# Patient Record
Sex: Male | Born: 1935 | Race: White | Hispanic: No | Smoking: Former smoker
Health system: Southern US, Community
[De-identification: ages and names within clinical notes are randomized; demographics above are authoritative.]

## PROBLEM LIST (undated history)

## (undated) DIAGNOSIS — I2510 Atherosclerotic heart disease of native coronary artery without angina pectoris: Secondary | ICD-10-CM

## (undated) DIAGNOSIS — I447 Left bundle-branch block, unspecified: Secondary | ICD-10-CM

## (undated) DIAGNOSIS — I219 Acute myocardial infarction, unspecified: Secondary | ICD-10-CM

## (undated) DIAGNOSIS — I350 Nonrheumatic aortic (valve) stenosis: Secondary | ICD-10-CM

## (undated) DIAGNOSIS — I5022 Chronic systolic (congestive) heart failure: Secondary | ICD-10-CM

## (undated) DIAGNOSIS — I10 Essential (primary) hypertension: Secondary | ICD-10-CM

## (undated) DIAGNOSIS — M1990 Unspecified osteoarthritis, unspecified site: Secondary | ICD-10-CM

## (undated) DIAGNOSIS — K602 Anal fissure, unspecified: Secondary | ICD-10-CM

## (undated) DIAGNOSIS — Z950 Presence of cardiac pacemaker: Secondary | ICD-10-CM

## (undated) DIAGNOSIS — D649 Anemia, unspecified: Secondary | ICD-10-CM

## (undated) DIAGNOSIS — J42 Unspecified chronic bronchitis: Secondary | ICD-10-CM

## (undated) DIAGNOSIS — IMO0002 Reserved for concepts with insufficient information to code with codable children: Secondary | ICD-10-CM

## (undated) DIAGNOSIS — I340 Nonrheumatic mitral (valve) insufficiency: Secondary | ICD-10-CM

## (undated) DIAGNOSIS — E785 Hyperlipidemia, unspecified: Secondary | ICD-10-CM

## (undated) DIAGNOSIS — R55 Syncope and collapse: Secondary | ICD-10-CM

## (undated) DIAGNOSIS — R9430 Abnormal result of cardiovascular function study, unspecified: Secondary | ICD-10-CM

## (undated) DIAGNOSIS — C61 Malignant neoplasm of prostate: Secondary | ICD-10-CM

## (undated) DIAGNOSIS — E538 Deficiency of other specified B group vitamins: Secondary | ICD-10-CM

## (undated) DIAGNOSIS — D509 Iron deficiency anemia, unspecified: Secondary | ICD-10-CM

## (undated) DIAGNOSIS — Z95810 Presence of automatic (implantable) cardiac defibrillator: Secondary | ICD-10-CM

## (undated) DIAGNOSIS — E039 Hypothyroidism, unspecified: Secondary | ICD-10-CM

## (undated) DIAGNOSIS — K219 Gastro-esophageal reflux disease without esophagitis: Secondary | ICD-10-CM

## (undated) DIAGNOSIS — I442 Atrioventricular block, complete: Secondary | ICD-10-CM

## (undated) HISTORY — DX: Iron deficiency anemia, unspecified: D50.9

## (undated) HISTORY — DX: Nonrheumatic mitral (valve) insufficiency: I34.0

## (undated) HISTORY — DX: Left bundle-branch block, unspecified: I44.7

## (undated) HISTORY — DX: Gastro-esophageal reflux disease without esophagitis: K21.9

## (undated) HISTORY — PX: SHOULDER OPEN ROTATOR CUFF REPAIR: SHX2407

## (undated) HISTORY — DX: Unspecified osteoarthritis, unspecified site: M19.90

## (undated) HISTORY — DX: Deficiency of other specified B group vitamins: E53.8

## (undated) HISTORY — DX: Nonrheumatic aortic (valve) stenosis: I35.0

## (undated) HISTORY — DX: Chronic systolic (congestive) heart failure: I50.22

## (undated) HISTORY — DX: Syncope and collapse: R55

## (undated) HISTORY — PX: INSERTION PROSTATE RADIATION SEED: SUR718

## (undated) HISTORY — PX: PROSTATE BIOPSY: SHX241

## (undated) HISTORY — DX: Hyperlipidemia, unspecified: E78.5

## (undated) HISTORY — DX: Essential (primary) hypertension: I10

## (undated) HISTORY — PX: RECTAL DILATATION: SHX2305

## (undated) HISTORY — DX: Anal fissure, unspecified: K60.2

## (undated) HISTORY — DX: Atrioventricular block, complete: I44.2

## (undated) HISTORY — DX: Atherosclerotic heart disease of native coronary artery without angina pectoris: I25.10

## (undated) HISTORY — DX: Anemia, unspecified: D64.9

## (undated) HISTORY — DX: Abnormal result of cardiovascular function study, unspecified: R94.30

## (undated) HISTORY — DX: Reserved for concepts with insufficient information to code with codable children: IMO0002

---

## 1987-08-07 DIAGNOSIS — I219 Acute myocardial infarction, unspecified: Secondary | ICD-10-CM

## 1987-08-07 HISTORY — DX: Acute myocardial infarction, unspecified: I21.9

## 1999-02-15 ENCOUNTER — Ambulatory Visit (HOSPITAL_COMMUNITY): Admission: RE | Admit: 1999-02-15 | Discharge: 1999-02-15 | Payer: Self-pay | Admitting: Pulmonary Disease

## 1999-02-15 ENCOUNTER — Encounter: Payer: Self-pay | Admitting: Pulmonary Disease

## 2003-08-07 HISTORY — PX: ICD IMPLANT: EP1208

## 2003-12-09 ENCOUNTER — Inpatient Hospital Stay (HOSPITAL_COMMUNITY): Admission: EM | Admit: 2003-12-09 | Discharge: 2003-12-17 | Payer: Self-pay | Admitting: *Deleted

## 2004-06-20 ENCOUNTER — Ambulatory Visit: Payer: Self-pay | Admitting: Pulmonary Disease

## 2004-06-26 ENCOUNTER — Ambulatory Visit: Payer: Self-pay | Admitting: Cardiology

## 2004-07-13 ENCOUNTER — Ambulatory Visit: Payer: Self-pay

## 2004-07-24 ENCOUNTER — Ambulatory Visit: Payer: Self-pay | Admitting: Pulmonary Disease

## 2004-08-23 ENCOUNTER — Ambulatory Visit: Payer: Self-pay | Admitting: Pulmonary Disease

## 2004-09-26 ENCOUNTER — Ambulatory Visit: Payer: Self-pay | Admitting: Pulmonary Disease

## 2004-10-31 ENCOUNTER — Ambulatory Visit: Payer: Self-pay | Admitting: Pulmonary Disease

## 2004-11-01 ENCOUNTER — Ambulatory Visit: Payer: Self-pay | Admitting: Pulmonary Disease

## 2004-12-12 ENCOUNTER — Ambulatory Visit: Payer: Self-pay | Admitting: Internal Medicine

## 2004-12-13 ENCOUNTER — Ambulatory Visit: Payer: Self-pay | Admitting: Pulmonary Disease

## 2005-01-03 ENCOUNTER — Ambulatory Visit: Payer: Self-pay | Admitting: Cardiology

## 2005-01-22 ENCOUNTER — Ambulatory Visit: Payer: Self-pay | Admitting: Pulmonary Disease

## 2005-02-20 ENCOUNTER — Ambulatory Visit: Payer: Self-pay | Admitting: Pulmonary Disease

## 2005-03-20 ENCOUNTER — Ambulatory Visit: Payer: Self-pay | Admitting: Pulmonary Disease

## 2005-04-04 ENCOUNTER — Ambulatory Visit: Payer: Self-pay | Admitting: Pulmonary Disease

## 2005-04-04 ENCOUNTER — Inpatient Hospital Stay (HOSPITAL_COMMUNITY): Admission: AD | Admit: 2005-04-04 | Discharge: 2005-04-07 | Payer: Self-pay | Admitting: Pulmonary Disease

## 2005-04-05 ENCOUNTER — Ambulatory Visit: Payer: Self-pay | Admitting: Gastroenterology

## 2005-04-05 ENCOUNTER — Ambulatory Visit: Payer: Self-pay | Admitting: Cardiology

## 2005-04-06 ENCOUNTER — Encounter (INDEPENDENT_AMBULATORY_CARE_PROVIDER_SITE_OTHER): Payer: Self-pay | Admitting: *Deleted

## 2005-04-06 HISTORY — PX: LAPAROSCOPIC CHOLECYSTECTOMY: SUR755

## 2005-04-13 ENCOUNTER — Ambulatory Visit: Payer: Self-pay | Admitting: Pulmonary Disease

## 2005-04-24 ENCOUNTER — Ambulatory Visit: Payer: Self-pay | Admitting: Pulmonary Disease

## 2005-04-27 ENCOUNTER — Ambulatory Visit: Payer: Self-pay

## 2005-05-24 ENCOUNTER — Ambulatory Visit: Payer: Self-pay | Admitting: Gastroenterology

## 2005-05-28 ENCOUNTER — Ambulatory Visit: Payer: Self-pay | Admitting: Pulmonary Disease

## 2005-06-05 ENCOUNTER — Ambulatory Visit: Payer: Self-pay | Admitting: Cardiology

## 2005-07-23 ENCOUNTER — Ambulatory Visit: Payer: Self-pay | Admitting: Pulmonary Disease

## 2005-07-24 ENCOUNTER — Ambulatory Visit: Payer: Self-pay | Admitting: Pulmonary Disease

## 2005-08-21 ENCOUNTER — Ambulatory Visit: Payer: Self-pay | Admitting: Pulmonary Disease

## 2005-10-01 ENCOUNTER — Ambulatory Visit: Payer: Self-pay | Admitting: Pulmonary Disease

## 2005-10-29 ENCOUNTER — Ambulatory Visit: Payer: Self-pay | Admitting: Pulmonary Disease

## 2005-11-28 ENCOUNTER — Ambulatory Visit: Payer: Self-pay | Admitting: Pulmonary Disease

## 2005-12-26 ENCOUNTER — Ambulatory Visit: Payer: Self-pay | Admitting: Pulmonary Disease

## 2005-12-26 ENCOUNTER — Ambulatory Visit: Payer: Self-pay | Admitting: Internal Medicine

## 2006-01-01 ENCOUNTER — Ambulatory Visit: Payer: Self-pay | Admitting: Pulmonary Disease

## 2006-01-30 ENCOUNTER — Ambulatory Visit: Payer: Self-pay | Admitting: Pulmonary Disease

## 2006-03-04 ENCOUNTER — Ambulatory Visit: Payer: Self-pay | Admitting: Pulmonary Disease

## 2006-03-28 ENCOUNTER — Ambulatory Visit: Payer: Self-pay | Admitting: Internal Medicine

## 2006-04-03 ENCOUNTER — Ambulatory Visit: Payer: Self-pay | Admitting: Pulmonary Disease

## 2006-04-25 ENCOUNTER — Ambulatory Visit: Payer: Self-pay | Admitting: Pulmonary Disease

## 2006-05-01 ENCOUNTER — Ambulatory Visit: Payer: Self-pay | Admitting: Pulmonary Disease

## 2006-05-06 ENCOUNTER — Ambulatory Visit: Payer: Self-pay | Admitting: Cardiology

## 2006-05-23 ENCOUNTER — Ambulatory Visit: Payer: Self-pay

## 2006-05-29 ENCOUNTER — Ambulatory Visit: Payer: Self-pay | Admitting: Pulmonary Disease

## 2006-05-31 ENCOUNTER — Ambulatory Visit: Payer: Self-pay | Admitting: Cardiology

## 2006-07-01 ENCOUNTER — Ambulatory Visit: Payer: Self-pay | Admitting: Pulmonary Disease

## 2006-07-02 ENCOUNTER — Ambulatory Visit: Payer: Self-pay | Admitting: Internal Medicine

## 2006-07-31 ENCOUNTER — Ambulatory Visit: Payer: Self-pay | Admitting: Pulmonary Disease

## 2006-08-06 HISTORY — PX: CARDIAC CATHETERIZATION: SHX172

## 2006-08-21 ENCOUNTER — Ambulatory Visit: Payer: Self-pay | Admitting: Pulmonary Disease

## 2006-08-21 LAB — CONVERTED CEMR LAB
Albumin: 3.7 g/dL (ref 3.5–5.2)
BUN: 16 mg/dL (ref 6–23)
Basophils Absolute: 0 10*3/uL (ref 0.0–0.1)
CO2: 29 meq/L (ref 19–32)
Chloride: 108 meq/L (ref 96–112)
Creatinine, Ser: 1 mg/dL (ref 0.4–1.5)
Hemoglobin: 12.7 g/dL — ABNORMAL LOW (ref 13.0–17.0)
MCHC: 33.8 g/dL (ref 30.0–36.0)
MCV: 92.5 fL (ref 78.0–100.0)
Monocytes Absolute: 0.5 10*3/uL (ref 0.2–0.7)
Monocytes Relative: 11.7 % — ABNORMAL HIGH (ref 3.0–11.0)
Neutro Abs: 2.4 10*3/uL (ref 1.4–7.7)
Neutrophils Relative %: 56.5 % (ref 43.0–77.0)
PSA: 0.32 ng/mL
RDW: 12.4 % (ref 11.5–14.6)
Sodium: 142 meq/L (ref 135–145)
Total Bilirubin: 1.7 mg/dL — ABNORMAL HIGH (ref 0.3–1.2)
Total CHOL/HDL Ratio: 3
Triglycerides: 60 mg/dL (ref 0–149)
VLDL: 12 mg/dL (ref 0–40)
Vitamin B-12: 236 pg/mL (ref 211–911)

## 2006-08-28 ENCOUNTER — Ambulatory Visit: Payer: Self-pay | Admitting: Pulmonary Disease

## 2006-09-30 ENCOUNTER — Ambulatory Visit: Payer: Self-pay | Admitting: Pulmonary Disease

## 2006-10-03 ENCOUNTER — Ambulatory Visit: Payer: Self-pay | Admitting: Internal Medicine

## 2006-10-29 ENCOUNTER — Ambulatory Visit: Payer: Self-pay | Admitting: Pulmonary Disease

## 2006-11-27 ENCOUNTER — Ambulatory Visit: Payer: Self-pay | Admitting: Pulmonary Disease

## 2006-12-13 ENCOUNTER — Ambulatory Visit: Payer: Self-pay | Admitting: Internal Medicine

## 2006-12-14 ENCOUNTER — Observation Stay (HOSPITAL_COMMUNITY): Admission: EM | Admit: 2006-12-14 | Discharge: 2006-12-17 | Payer: Self-pay | Admitting: Emergency Medicine

## 2006-12-14 ENCOUNTER — Ambulatory Visit: Payer: Self-pay | Admitting: Cardiology

## 2006-12-25 ENCOUNTER — Ambulatory Visit: Payer: Self-pay | Admitting: Pulmonary Disease

## 2007-01-08 ENCOUNTER — Ambulatory Visit: Payer: Self-pay | Admitting: Cardiology

## 2007-01-27 ENCOUNTER — Ambulatory Visit: Payer: Self-pay | Admitting: Pulmonary Disease

## 2007-02-19 ENCOUNTER — Ambulatory Visit: Payer: Self-pay | Admitting: Pulmonary Disease

## 2007-02-19 LAB — CONVERTED CEMR LAB
Albumin: 3.8 g/dL (ref 3.5–5.2)
Bilirubin, Direct: 0.1 mg/dL (ref 0.0–0.3)
LDL Cholesterol: 90 mg/dL (ref 0–99)
Total CHOL/HDL Ratio: 4.4
Triglycerides: 124 mg/dL (ref 0–149)
VLDL: 25 mg/dL (ref 0–40)

## 2007-02-26 ENCOUNTER — Ambulatory Visit: Payer: Self-pay | Admitting: Pulmonary Disease

## 2007-03-31 ENCOUNTER — Ambulatory Visit: Payer: Self-pay | Admitting: Pulmonary Disease

## 2007-04-03 ENCOUNTER — Ambulatory Visit: Payer: Self-pay | Admitting: Internal Medicine

## 2007-04-09 ENCOUNTER — Ambulatory Visit: Payer: Self-pay | Admitting: Cardiology

## 2007-05-01 ENCOUNTER — Ambulatory Visit: Payer: Self-pay | Admitting: Pulmonary Disease

## 2007-05-27 ENCOUNTER — Encounter: Payer: Self-pay | Admitting: Pulmonary Disease

## 2007-06-02 ENCOUNTER — Ambulatory Visit: Payer: Self-pay | Admitting: Pulmonary Disease

## 2007-07-01 ENCOUNTER — Ambulatory Visit: Payer: Self-pay | Admitting: Pulmonary Disease

## 2007-07-08 ENCOUNTER — Ambulatory Visit: Payer: Self-pay | Admitting: Internal Medicine

## 2007-08-01 ENCOUNTER — Ambulatory Visit: Payer: Self-pay | Admitting: Pulmonary Disease

## 2007-08-12 ENCOUNTER — Telehealth: Payer: Self-pay | Admitting: Pulmonary Disease

## 2007-08-14 ENCOUNTER — Ambulatory Visit: Payer: Self-pay | Admitting: Pulmonary Disease

## 2007-08-19 ENCOUNTER — Ambulatory Visit: Payer: Self-pay | Admitting: Pulmonary Disease

## 2007-08-19 DIAGNOSIS — M1990 Unspecified osteoarthritis, unspecified site: Secondary | ICD-10-CM

## 2007-08-19 LAB — CONVERTED CEMR LAB
ALT: 20 units/L (ref 0–53)
AST: 25 units/L (ref 0–37)
Albumin: 3.9 g/dL (ref 3.5–5.2)
Basophils Absolute: 0 10*3/uL (ref 0.0–0.1)
Bilirubin, Direct: 0.2 mg/dL (ref 0.0–0.3)
Calcium: 9.2 mg/dL (ref 8.4–10.5)
Chloride: 102 meq/L (ref 96–112)
Cholesterol: 163 mg/dL (ref 0–200)
Eosinophils Absolute: 0.5 10*3/uL (ref 0.0–0.6)
GFR calc Af Amer: 94 mL/min
GFR calc non Af Amer: 78 mL/min
Glucose, Bld: 94 mg/dL (ref 70–99)
HDL: 33.7 mg/dL — ABNORMAL LOW (ref >39.0)
Lymphocytes Relative: 27 % (ref 12.0–46.0)
MCHC: 35.5 g/dL (ref 30.0–36.0)
MCV: 92.1 fL (ref 78.0–100.0)
Neutro Abs: 2.6 10*3/uL (ref 1.4–7.7)
Platelets: 213 10*3/uL (ref 150–400)
RBC: 3.94 M/uL — ABNORMAL LOW (ref 4.22–5.81)
Sodium: 137 meq/L (ref 135–145)
TSH: 5 microintl units/mL (ref 0.35–5.50)
Total CHOL/HDL Ratio: 4.8
Triglycerides: 94 mg/dL (ref 0–149)

## 2007-09-01 ENCOUNTER — Ambulatory Visit: Payer: Self-pay | Admitting: Pulmonary Disease

## 2007-10-02 ENCOUNTER — Ambulatory Visit: Payer: Self-pay | Admitting: Pulmonary Disease

## 2007-10-06 ENCOUNTER — Ambulatory Visit: Payer: Self-pay | Admitting: Internal Medicine

## 2007-10-30 ENCOUNTER — Ambulatory Visit: Payer: Self-pay | Admitting: Pulmonary Disease

## 2007-11-17 ENCOUNTER — Ambulatory Visit: Payer: Self-pay | Admitting: Cardiology

## 2007-11-21 ENCOUNTER — Ambulatory Visit: Payer: Self-pay

## 2007-12-01 ENCOUNTER — Ambulatory Visit: Payer: Self-pay | Admitting: Pulmonary Disease

## 2007-12-03 ENCOUNTER — Ambulatory Visit: Payer: Self-pay | Admitting: Cardiology

## 2007-12-05 ENCOUNTER — Ambulatory Visit: Payer: Self-pay | Admitting: Internal Medicine

## 2007-12-30 ENCOUNTER — Ambulatory Visit: Payer: Self-pay | Admitting: Pulmonary Disease

## 2008-01-30 ENCOUNTER — Ambulatory Visit: Payer: Self-pay | Admitting: Pulmonary Disease

## 2008-02-20 ENCOUNTER — Telehealth: Payer: Self-pay | Admitting: Pulmonary Disease

## 2008-02-24 ENCOUNTER — Ambulatory Visit: Payer: Self-pay | Admitting: Pulmonary Disease

## 2008-03-01 ENCOUNTER — Ambulatory Visit: Payer: Self-pay | Admitting: Pulmonary Disease

## 2008-03-01 DIAGNOSIS — E538 Deficiency of other specified B group vitamins: Secondary | ICD-10-CM

## 2008-03-01 LAB — CONVERTED CEMR LAB
AST: 25 units/L (ref 0–37)
Albumin: 4.1 g/dL (ref 3.5–5.2)
Alkaline Phosphatase: 42 units/L (ref 39–117)
BUN: 19 mg/dL (ref 6–23)
Basophils Absolute: 0 10*3/uL (ref 0.0–0.1)
Bilirubin, Direct: 0.3 mg/dL (ref 0.0–0.3)
Chloride: 104 meq/L (ref 96–112)
Eosinophils Absolute: 0.4 10*3/uL (ref 0.0–0.7)
GFR calc Af Amer: 106 mL/min
GFR calc non Af Amer: 88 mL/min
HCT: 37.5 % — ABNORMAL LOW (ref 39.0–52.0)
HDL: 38.1 mg/dL — ABNORMAL LOW (ref >39.0)
MCV: 94.1 fL (ref 78.0–100.0)
Monocytes Absolute: 0.5 10*3/uL (ref 0.1–1.0)
Neutrophils Relative %: 55.2 % (ref 43.0–77.0)
Platelets: 214 10*3/uL (ref 150–400)
Potassium: 4.7 meq/L (ref 3.5–5.1)
RDW: 12 % (ref 11.5–14.6)
Sodium: 140 meq/L (ref 135–145)
Total Bilirubin: 2.2 mg/dL — ABNORMAL HIGH (ref 0.3–1.2)
Triglycerides: 92 mg/dL (ref 0–149)
VLDL: 18 mg/dL (ref 0–40)
WBC: 4.8 10*3/uL (ref 4.5–10.5)

## 2008-03-08 ENCOUNTER — Ambulatory Visit: Payer: Self-pay | Admitting: Internal Medicine

## 2008-04-01 ENCOUNTER — Ambulatory Visit: Payer: Self-pay | Admitting: Pulmonary Disease

## 2008-05-03 ENCOUNTER — Ambulatory Visit: Payer: Self-pay | Admitting: Pulmonary Disease

## 2008-05-04 ENCOUNTER — Ambulatory Visit: Payer: Self-pay | Admitting: Pulmonary Disease

## 2008-05-17 ENCOUNTER — Ambulatory Visit: Payer: Self-pay | Admitting: Cardiology

## 2008-06-01 ENCOUNTER — Ambulatory Visit: Payer: Self-pay | Admitting: Pulmonary Disease

## 2008-06-07 ENCOUNTER — Ambulatory Visit: Payer: Self-pay | Admitting: Internal Medicine

## 2008-06-29 ENCOUNTER — Ambulatory Visit: Payer: Self-pay | Admitting: Pulmonary Disease

## 2008-08-02 ENCOUNTER — Ambulatory Visit: Payer: Self-pay | Admitting: Pulmonary Disease

## 2008-08-03 DIAGNOSIS — D649 Anemia, unspecified: Secondary | ICD-10-CM | POA: Insufficient documentation

## 2008-08-25 ENCOUNTER — Telehealth: Payer: Self-pay | Admitting: Pulmonary Disease

## 2008-08-27 ENCOUNTER — Ambulatory Visit: Payer: Self-pay | Admitting: Pulmonary Disease

## 2008-09-01 ENCOUNTER — Ambulatory Visit: Payer: Self-pay | Admitting: Pulmonary Disease

## 2008-09-01 LAB — CONVERTED CEMR LAB
Basophils Absolute: 0 10*3/uL (ref 0.0–0.1)
Basophils Relative: 0.5 % (ref 0.0–3.0)
Bilirubin Urine: NEGATIVE
Calcium: 9.3 mg/dL (ref 8.4–10.5)
Cholesterol: 125 mg/dL (ref 0–200)
Creatinine, Ser: 0.9 mg/dL (ref 0.4–1.5)
Eosinophils Absolute: 0.4 10*3/uL (ref 0.0–0.7)
GFR calc Af Amer: 106 mL/min
GFR calc non Af Amer: 88 mL/min
HDL: 32 mg/dL — ABNORMAL LOW (ref >39.0)
Hemoglobin: 12.7 g/dL — ABNORMAL LOW (ref 13.0–17.0)
Ketones, ur: NEGATIVE mg/dL
LDL Cholesterol: 80 mg/dL (ref 0–99)
MCHC: 34.7 g/dL (ref 30.0–36.0)
MCV: 93.5 fL (ref 78.0–100.0)
Neutro Abs: 3 10*3/uL (ref 1.4–7.7)
Neutrophils Relative %: 60 % (ref 43.0–77.0)
PSA: 0.62 ng/mL (ref 0.10–4.00)
RBC: 3.93 M/uL — ABNORMAL LOW (ref 4.22–5.81)
RDW: 12.3 % (ref 11.5–14.6)
Specific Gravity, Urine: 1.01 (ref 1.000–1.03)
TSH: 4.97 microintl units/mL (ref 0.35–5.50)
Total Bilirubin: 2.1 mg/dL — ABNORMAL HIGH (ref 0.3–1.2)
Triglycerides: 67 mg/dL (ref 0–149)
Urine Glucose: NEGATIVE mg/dL
Urobilinogen, UA: 1 (ref 0.0–1.0)

## 2008-09-07 ENCOUNTER — Ambulatory Visit: Payer: Self-pay | Admitting: Internal Medicine

## 2008-10-01 ENCOUNTER — Ambulatory Visit: Payer: Self-pay | Admitting: Pulmonary Disease

## 2008-10-29 ENCOUNTER — Ambulatory Visit: Payer: Self-pay | Admitting: Pulmonary Disease

## 2008-11-29 ENCOUNTER — Ambulatory Visit: Payer: Self-pay | Admitting: Pulmonary Disease

## 2008-12-16 ENCOUNTER — Ambulatory Visit: Payer: Self-pay | Admitting: Internal Medicine

## 2008-12-29 ENCOUNTER — Ambulatory Visit: Payer: Self-pay | Admitting: Pulmonary Disease

## 2009-01-28 ENCOUNTER — Ambulatory Visit: Payer: Self-pay | Admitting: Pulmonary Disease

## 2009-02-17 ENCOUNTER — Telehealth: Payer: Self-pay | Admitting: Pulmonary Disease

## 2009-02-21 ENCOUNTER — Ambulatory Visit: Payer: Self-pay | Admitting: Pulmonary Disease

## 2009-03-01 ENCOUNTER — Ambulatory Visit: Payer: Self-pay | Admitting: Pulmonary Disease

## 2009-03-01 LAB — CONVERTED CEMR LAB
Albumin: 3.7 g/dL (ref 3.5–5.2)
Alkaline Phosphatase: 55 units/L (ref 39–117)
Basophils Absolute: 0 10*3/uL (ref 0.0–0.1)
CO2: 30 meq/L (ref 19–32)
Calcium: 9 mg/dL (ref 8.4–10.5)
Cholesterol: 124 mg/dL (ref 0–200)
Glucose, Bld: 82 mg/dL (ref 70–99)
HDL: 35.8 mg/dL — ABNORMAL LOW (ref >39.00)
Hemoglobin, Urine: NEGATIVE
Hemoglobin: 12.7 g/dL — ABNORMAL LOW (ref 13.0–17.0)
Leukocytes, UA: NEGATIVE
Lymphocytes Relative: 19.1 % (ref 12.0–46.0)
Monocytes Relative: 10.2 % (ref 3.0–12.0)
Neutro Abs: 3.8 10*3/uL (ref 1.4–7.7)
Nitrite: NEGATIVE
PSA: 0.63 ng/mL (ref 0.10–4.00)
Platelets: 172 10*3/uL (ref 150.0–400.0)
RDW: 12.1 % (ref 11.5–14.6)
Sodium: 142 meq/L (ref 135–145)
TSH: 4.73 microintl units/mL (ref 0.35–5.50)
Total Protein: 6.9 g/dL (ref 6.0–8.3)
Triglycerides: 86 mg/dL (ref 0.0–149.0)
Urobilinogen, UA: 0.2 (ref 0.0–1.0)

## 2009-03-14 ENCOUNTER — Encounter: Payer: Self-pay | Admitting: Internal Medicine

## 2009-03-14 ENCOUNTER — Ambulatory Visit: Payer: Self-pay | Admitting: Internal Medicine

## 2009-03-22 ENCOUNTER — Encounter: Payer: Self-pay | Admitting: Internal Medicine

## 2009-04-01 ENCOUNTER — Ambulatory Visit: Payer: Self-pay | Admitting: Pulmonary Disease

## 2009-05-04 ENCOUNTER — Ambulatory Visit: Payer: Self-pay | Admitting: Pulmonary Disease

## 2009-05-17 ENCOUNTER — Encounter: Payer: Self-pay | Admitting: Cardiology

## 2009-05-18 ENCOUNTER — Ambulatory Visit: Payer: Self-pay | Admitting: Cardiology

## 2009-06-01 ENCOUNTER — Ambulatory Visit: Payer: Self-pay | Admitting: Internal Medicine

## 2009-06-01 ENCOUNTER — Ambulatory Visit: Payer: Self-pay | Admitting: Pulmonary Disease

## 2009-06-13 ENCOUNTER — Ambulatory Visit: Payer: Self-pay | Admitting: Internal Medicine

## 2009-06-23 ENCOUNTER — Encounter: Payer: Self-pay | Admitting: Internal Medicine

## 2009-07-01 ENCOUNTER — Ambulatory Visit: Payer: Self-pay | Admitting: Pulmonary Disease

## 2009-08-03 ENCOUNTER — Ambulatory Visit: Payer: Self-pay | Admitting: Pulmonary Disease

## 2009-08-22 ENCOUNTER — Ambulatory Visit: Payer: Self-pay | Admitting: Internal Medicine

## 2009-08-22 ENCOUNTER — Telehealth: Payer: Self-pay | Admitting: Pulmonary Disease

## 2009-08-23 ENCOUNTER — Encounter: Payer: Self-pay | Admitting: Internal Medicine

## 2009-08-24 ENCOUNTER — Ambulatory Visit: Payer: Self-pay | Admitting: Pulmonary Disease

## 2009-08-30 ENCOUNTER — Telehealth: Payer: Self-pay | Admitting: Pulmonary Disease

## 2009-08-30 ENCOUNTER — Ambulatory Visit: Payer: Self-pay | Admitting: Pulmonary Disease

## 2009-08-31 LAB — CONVERTED CEMR LAB
ALT: 20 units/L (ref 0–53)
Albumin: 4.2 g/dL (ref 3.5–5.2)
Basophils Absolute: 0 10*3/uL (ref 0.0–0.1)
Calcium: 9.3 mg/dL (ref 8.4–10.5)
Eosinophils Relative: 5 % (ref 0.0–5.0)
GFR calc non Af Amer: 87.39 mL/min (ref >60)
HCT: 38.1 % — ABNORMAL LOW (ref 39.0–52.0)
HDL: 38.8 mg/dL — ABNORMAL LOW (ref >39.00)
Hemoglobin: 12.6 g/dL — ABNORMAL LOW (ref 13.0–17.0)
Lymphs Abs: 1.2 10*3/uL (ref 0.7–4.0)
MCV: 96.5 fL (ref 78.0–100.0)
Monocytes Absolute: 0.6 10*3/uL (ref 0.1–1.0)
Neutro Abs: 3.4 10*3/uL (ref 1.4–7.7)
PSA: 0.6 ng/mL (ref 0.10–4.00)
Platelets: 178 10*3/uL (ref 150.0–400.0)
RDW: 11.8 % (ref 11.5–14.6)
Sodium: 139 meq/L (ref 135–145)
TSH: 4.88 microintl units/mL (ref 0.35–5.50)
Total Bilirubin: 1.7 mg/dL — ABNORMAL HIGH (ref 0.3–1.2)
Triglycerides: 99 mg/dL (ref 0.0–149.0)

## 2009-09-30 ENCOUNTER — Ambulatory Visit: Payer: Self-pay | Admitting: Pulmonary Disease

## 2009-10-24 ENCOUNTER — Ambulatory Visit: Payer: Self-pay | Admitting: Internal Medicine

## 2009-10-28 ENCOUNTER — Ambulatory Visit: Payer: Self-pay | Admitting: Pulmonary Disease

## 2009-11-09 ENCOUNTER — Encounter: Payer: Self-pay | Admitting: Internal Medicine

## 2009-11-28 ENCOUNTER — Ambulatory Visit: Payer: Self-pay | Admitting: Pulmonary Disease

## 2009-12-27 ENCOUNTER — Ambulatory Visit: Payer: Self-pay | Admitting: Internal Medicine

## 2009-12-28 ENCOUNTER — Ambulatory Visit: Payer: Self-pay | Admitting: Pulmonary Disease

## 2009-12-28 ENCOUNTER — Encounter: Payer: Self-pay | Admitting: Internal Medicine

## 2010-01-06 ENCOUNTER — Ambulatory Visit: Payer: Self-pay | Admitting: Internal Medicine

## 2010-01-06 LAB — CONVERTED CEMR LAB
Calcium: 9.3 mg/dL (ref 8.4–10.5)
Chloride: 106 meq/L (ref 96–112)
Creatinine, Ser: 0.96 mg/dL (ref 0.40–1.50)
Eosinophils Relative: 13 % — ABNORMAL HIGH (ref 0–5)
HCT: 33.3 % — ABNORMAL LOW (ref 39.0–52.0)
Hemoglobin: 11.8 g/dL — ABNORMAL LOW (ref 13.0–17.0)
INR: 0.95 (ref <1.50)
Lymphocytes Relative: 27 % (ref 12–46)
Lymphs Abs: 1.4 10*3/uL (ref 0.7–4.0)
Platelets: 187 10*3/uL (ref 150–400)
WBC: 5.2 10*3/uL (ref 4.0–10.5)
aPTT: 28 s (ref 24–37)

## 2010-01-12 ENCOUNTER — Ambulatory Visit (HOSPITAL_COMMUNITY): Admission: RE | Admit: 2010-01-12 | Discharge: 2010-01-12 | Payer: Self-pay | Admitting: Internal Medicine

## 2010-01-12 ENCOUNTER — Ambulatory Visit: Payer: Self-pay | Admitting: Internal Medicine

## 2010-01-16 ENCOUNTER — Encounter: Payer: Self-pay | Admitting: Internal Medicine

## 2010-01-23 ENCOUNTER — Ambulatory Visit: Payer: Self-pay

## 2010-01-27 ENCOUNTER — Ambulatory Visit: Payer: Self-pay | Admitting: Pulmonary Disease

## 2010-03-08 ENCOUNTER — Ambulatory Visit: Payer: Self-pay | Admitting: Pulmonary Disease

## 2010-03-10 ENCOUNTER — Ambulatory Visit: Payer: Self-pay | Admitting: Pulmonary Disease

## 2010-03-14 LAB — CONVERTED CEMR LAB
Albumin: 4 g/dL (ref 3.5–5.2)
Chloride: 107 meq/L (ref 96–112)
Cholesterol: 134 mg/dL (ref 0–200)
HDL: 36.1 mg/dL — ABNORMAL LOW (ref >39.00)
LDL Cholesterol: 82 mg/dL (ref 0–99)
Potassium: 4.7 meq/L (ref 3.5–5.1)
Sodium: 142 meq/L (ref 135–145)
Total Protein: 6.7 g/dL (ref 6.0–8.3)
Triglycerides: 82 mg/dL (ref 0.0–149.0)
VLDL: 16.4 mg/dL (ref 0.0–40.0)

## 2010-04-07 ENCOUNTER — Ambulatory Visit: Payer: Self-pay | Admitting: Pulmonary Disease

## 2010-05-02 ENCOUNTER — Ambulatory Visit: Payer: Self-pay | Admitting: Internal Medicine

## 2010-05-08 ENCOUNTER — Ambulatory Visit: Payer: Self-pay | Admitting: Internal Medicine

## 2010-05-08 ENCOUNTER — Ambulatory Visit: Payer: Self-pay | Admitting: Pulmonary Disease

## 2010-05-09 ENCOUNTER — Encounter: Payer: Self-pay | Admitting: Cardiology

## 2010-05-10 ENCOUNTER — Ambulatory Visit: Payer: Self-pay | Admitting: Cardiology

## 2010-05-22 ENCOUNTER — Ambulatory Visit: Payer: Self-pay | Admitting: Cardiology

## 2010-05-22 ENCOUNTER — Ambulatory Visit: Payer: Self-pay

## 2010-05-22 ENCOUNTER — Ambulatory Visit (HOSPITAL_COMMUNITY): Admission: RE | Admit: 2010-05-22 | Discharge: 2010-05-22 | Payer: Self-pay | Admitting: Cardiology

## 2010-05-22 ENCOUNTER — Encounter: Payer: Self-pay | Admitting: Cardiology

## 2010-05-27 ENCOUNTER — Encounter: Payer: Self-pay | Admitting: Internal Medicine

## 2010-05-31 ENCOUNTER — Telehealth: Payer: Self-pay | Admitting: Cardiology

## 2010-06-08 ENCOUNTER — Ambulatory Visit: Payer: Self-pay | Admitting: Pulmonary Disease

## 2010-07-07 ENCOUNTER — Ambulatory Visit: Payer: Self-pay | Admitting: Pulmonary Disease

## 2010-08-03 ENCOUNTER — Ambulatory Visit: Payer: Self-pay | Admitting: Internal Medicine

## 2010-08-04 ENCOUNTER — Ambulatory Visit: Payer: Self-pay | Admitting: Pulmonary Disease

## 2010-08-24 ENCOUNTER — Telehealth: Payer: Self-pay | Admitting: Pulmonary Disease

## 2010-08-28 ENCOUNTER — Other Ambulatory Visit: Payer: Self-pay | Admitting: Pulmonary Disease

## 2010-08-28 ENCOUNTER — Ambulatory Visit
Admission: RE | Admit: 2010-08-28 | Discharge: 2010-08-28 | Payer: Self-pay | Source: Home / Self Care | Attending: Pulmonary Disease | Admitting: Pulmonary Disease

## 2010-08-28 LAB — CBC WITH DIFFERENTIAL/PLATELET
Basophils Relative: 1.3 % (ref 0.0–3.0)
Eosinophils Absolute: 0.3 10*3/uL (ref 0.0–0.7)
Eosinophils Relative: 7 % — ABNORMAL HIGH (ref 0.0–5.0)
HCT: 37.9 % — ABNORMAL LOW (ref 39.0–52.0)
Lymphs Abs: 1.2 10*3/uL (ref 0.7–4.0)
MCHC: 34.8 g/dL (ref 30.0–36.0)
Monocytes Absolute: 0.5 10*3/uL (ref 0.1–1.0)
Neutro Abs: 2.6 10*3/uL (ref 1.4–7.7)
Neutrophils Relative %: 56.2 % (ref 43.0–77.0)
Platelets: 224 10*3/uL (ref 150.0–400.0)
WBC: 4.6 10*3/uL (ref 4.5–10.5)

## 2010-08-28 LAB — BASIC METABOLIC PANEL
CO2: 30 mEq/L (ref 19–32)
GFR: 95.69 mL/min (ref >60.00)
Glucose, Bld: 90 mg/dL (ref 70–99)
Potassium: 4.9 mEq/L (ref 3.5–5.1)

## 2010-08-28 LAB — HEPATIC FUNCTION PANEL
ALT: 15 U/L (ref 0–53)
Bilirubin, Direct: 0.2 mg/dL (ref 0.0–0.3)

## 2010-08-28 LAB — LIPID PANEL: VLDL: 27.8 mg/dL (ref 0.0–40.0)

## 2010-09-05 ENCOUNTER — Ambulatory Visit
Admission: RE | Admit: 2010-09-05 | Discharge: 2010-09-05 | Payer: Self-pay | Source: Home / Self Care | Attending: Pulmonary Disease | Admitting: Pulmonary Disease

## 2010-09-07 NOTE — Cardiovascular Report (Signed)
Summary: Office Visit   Office Visit   Imported By: Roderic Ovens 05/03/2010 15:38:23  _____________________________________________________________________  External Attachment:    Type:   Image     Comment:   External Document

## 2010-09-07 NOTE — Assessment & Plan Note (Signed)
Summary: b-12/apc  Nurse Visit   Allergies: No Known Drug Allergies  Medication Administration  Injection # 1:    Medication: Vit B12 1000 mcg    Diagnosis: UNSPECIFIED ANEMIA (ICD-285.9)    Route: SQ    Site: L deltoid    Exp Date: 04/08/2011    Lot #: [id number]    Mfr: American Regent    Comments: B -12 SHOT LEFT ARM    Patient tolerated injection without complications    Given by: Glade Lloyd IN ALLERGY LAB  Orders Added: 1)  Vit B12 1000 mcg [J3420] 2)  Admin of Therapeutic Inj  intramuscular or subcutaneous [96372]   Medication Administration  Injection # 1:    Medication: Vit B12 1000 mcg    Diagnosis: UNSPECIFIED ANEMIA (ICD-285.9)    Route: SQ    Site: L deltoid    Exp Date: 04/08/2011    Lot #: [id number]    Mfr: American Regent    Comments: B -12 SHOT LEFT ARM    Patient tolerated injection without complications    Given by: Glade Lloyd IN ALLERGY LAB  Orders Added: 1)  Vit B12 1000 mcg [J3420] 2)  Admin of Therapeutic Inj  intramuscular or subcutaneous [56213]

## 2010-09-07 NOTE — Assessment & Plan Note (Signed)
Summary: b-12/aspc  Nurse Visit   Allergies: No Known Drug Allergies  Immunizations Administered:  Influenza Vaccine # 1:    Vaccine Type: Fluvax MCR    Site: right deltoid    Mfr: GlaxoSmithKline    Dose: 0.5 ml    Route: IM    Given by: Kandice Hams CMA    Exp. Date: 02/03/2011    Lot #: [id number]    VIS given: 02/28/10 version given May 08, 2010.  Flu Vaccine Consent Questions:    Do you have a history of severe allergic reactions to this vaccine? no    Any prior history of allergic reactions to egg and/or gelatin? no    Do you have a sensitivity to the preservative Thimersol? no    Do you have a past history of Guillan-Barre Syndrome? no    Do you currently have an acute febrile illness? no    Have you ever had a severe reaction to latex? no    Vaccine information given and explained to patient? yes  Medication Administration  Injection # 3:    Medication: Vit B12 1000 mcg    Diagnosis: UNSPECIFIED ANEMIA (ICD-285.9)    Route: IM    Site: L deltoid    Exp Date: 09/07/2011    Lot #: [id number]    Mfr: American Regent    Patient tolerated injection without complications    Given by: Kandice Hams CMA (May 08, 2010 10:26 AM)  Orders Added: 1)  Vit B12 1000 mcg [J3420] 2)  Admin of Therapeutic Inj  intramuscular or subcutaneous [96372] 3)  Influenza Vaccine MCR [00025]

## 2010-09-07 NOTE — Progress Notes (Signed)
Summary: order  Phone Note Call from Patient   Caller: Daughter Call For: [name] Summary of Call: pt need labs put in for cpx Initial call taken by: Rickard Patience,  August 22, 2009 8:24 AM  Follow-up for Phone Call        Pt has cpx sched for Tues 1/25 at 2 pm.  Would like to have labs done this wk.  Please advise what needs to be ordered and I will be happy to place in idx thanks! [name]  August 22, 2009 8:35 AM   Additional Follow-up for Phone Call Additional follow up Details #1::        per SN---ok for labs---labs are in computer for pt and lmom to make pt aware. [name] CMA  August 22, 2009 10:30 AM

## 2010-09-07 NOTE — Assessment & Plan Note (Signed)
Summary: 6 month return/mhh   Primary Care Provider:  Dr.Scott Nadel  CC:  6 month ROV & review of mult medical problems....  History of Present Illness: 77 y/o Blevins here for a follow up visit & review of mult medical problems...   ~  Jul10:  he states he's been doing well and has no complaints or concerns today... 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 he saw Delton See for a Cardiology f/u in Oct09- doing well, no change in meds.   ~  August 30, 2009:  he continues to do well- no new complaints or concerns... BP controlled on meds; he saw DrKatz for f/u 10/10- doing well, no CP, mild MR, ICD in place, no change in Rx... Chol controlled on Simva + Gemfib + Fish Oil; still getting B12 shots monthly; has Flu shot 10/10 & due for PNEUMOVAX now.    Current Problem List:  HYPERTENSION (ICD-401.9) - controlled on TOPROL XL 100mg /d & ALTACE 10mg /d... takes meds regularly and tolerates well... BP 134/72 here, and OK at home per pt... he gets rare HA which he believes is related to his BP... denies fatigue, visual changes, CP, palipit, dizziness, syncope, dyspnea, edema, etc...  CAD (ICD-414.00) - he's had prev MI w/ mult caths/ interventions... on ASA 81mg /d & PLAVIX 75mg /d.  ~  last cath 5/08 showed norm Lmain, patent stent in prox LAD, 80-90% lesion in mid-LAD w/ PCI & stent placed, & 50% ostial stenosis of 1st diagonal branch of LAD; 40%mid & 70%distal CIRC; 50%prox 40%mid 40%distal RCA; Global HK w/EF=30%...  ~  NuclearStressTest 4/09 showed infer scar, no ischemia, EF=40%...  ~  10/10: yearly f/u DrKatz- doing well, no CP, mild MR, ICD in place, no change in Rx...   ISCHEMIC CARDIOMYOPATHY (ICD-414.8) - s/p pacer/ AICD... EF= 30% on cath 5/08... followed closely by [facility] as above...  Hx of SYNCOPE (ICD-780.2) & AV BLOCK, COMPLETE (ICD-426.0) - Pacer/ ICD per DrTaylor w/ several adjustments in 2008... last seen 5/10 &  stable- f/u planned 37yr.  HYPERCHOLESTEROLEMIA (ICD-272.0) - takes SIMVASTATIN 80mg /d, GEMFIBRIZOL 600mg /d, & FISH OIL Bid...  ~  FLP 08/14/07 on Simva40 showed TChol 163, TG 94, HDL 34, LDL 111... rec incr Simvastatin to 80mg /d.  ~  FLP 02/24/08 on Simva80 showed TChol 136, TG 92, HDL 38, LDL 80... continue same.  ~  FLP 1/10 showed TChol 125, TG 67, HDL 32, LDL 80... rec- continue same.  ~  FLP 7/10 showed TChol 124, TG 86, HDL 36, LDL 71  ~  FLP 1/11 showed TChol 137, TG 99, HDL 39, LDL 78  GERD (ICD-530.81) - he had ERCP in 8/06 w/ norm ducts, +gallstones, s/p lap chole 2006... he uses OTC meds Prn...  DEGENERATIVE JOINT DISEASE (ICD-715.90) - he tried Mobic but it bothered his stomach, therefore uses Tylenol Prn.  VITAMIN B12 DEFICIENCY (ICD-266.2) - old chart not available- ? details of this Dx... on B12 shots xyrs.  ~  labs 1/09 showed Hg=12.9  ~  labs 1/10 showed Hg= 12.7  ~  labs 7/10 showed Hg= 12.7    Allergies (verified): No Known Drug Allergies  Comments:  Nurse/Medical Assistant: The patient's medications and allergies were reviewed with the patient and were updated in the Medication and Allergy Lists.  Past History:  Past Medical History: HYPERTENSION (ICD-401.9) CAD (ICD-414.00..DES 2005 .Marland KitchenLAD / DES..2008  .Marland KitchenMarland KitchenLAD (new) LBBB ISCHEMIC CARDIOMYOPATHY (ICD-414.8) EF 30%..echo..12/2003 Mitral regurgitation...mild  echo..12/2003 ICD...2005 ..for syncope and arrythmia.Marland Kitchen / feels better with tate response mode off SYNCOPE (ICD-780.2)...2005.. received ICD AV BLOCK, COMPLETE (ICD-426.0)  HYPERCHOLESTEROLEMIA (ICD-272.0) GERD (ICD-530.81) DEGENERATIVE JOINT DISEASE (ICD-715.90) VITAMIN B12 DEFICIENCY (ICD-266.2)  Past Surgical History: S/P ICD Implanted S/P laparoscopic cholecystectomy 9/06 by [facility]  Family History: Reviewed history from 09/01/2008 and no changes required. Mother died age 18 w/ ? ischemic bowel. Father died age 41 w/ heart problems. 6  siblings: 4 Brothers: one died age 77 w/ MI; one had CABG; one had lung cancer surg. 2 Sisters: one w/ pacer.  Social History: Reviewed history from 09/01/2008 and no changes required. quit smoking in 1954 no etoh widowed 2 children repairs looms at [employer]  Review of Systems      See HPI  The patient denies anorexia, fever, weight loss, weight gain, vision loss, decreased hearing, hoarseness, chest pain, syncope, dyspnea on exertion, peripheral edema, prolonged cough, headaches, hemoptysis, abdominal pain, melena, hematochezia, severe indigestion/heartburn, hematuria, incontinence, muscle weakness, suspicious skin lesions, transient blindness, difficulty walking, depression, unusual weight change, abnormal bleeding, enlarged lymph nodes, and angioedema.    Vital Signs:  Patient profile:   77 year old male Height:      71 inches Weight:      175.50 pounds BMI:     24.57 O2 Sat:      97 % on Room air Temp:     97.1 degrees F oral Pulse rate:   50 / minute BP sitting:   134 / 72  (right arm) Cuff size:   regular  Vitals Entered By: Randell Loop CMA (August 30, 2009 1:58 PM)  O2 Sat at Rest %:  97 O2 Flow:  Room air CC: 6 month ROV & review of mult medical problems... Is Patient Diabetic? No Pain Assessment Patient in pain? no      Comments no changes in meds today   Physical Exam  Additional Exam:  WD, WN, 77 y/o Blevins in NAD... wt=176# ht=6'1" BMI=23... GENERAL:  Alert & oriented; pleasant & cooperative... HEENT:  [place]/AT, EOM-wnl, PERRLA, EACs-clear, TMs-wnl, NOSE-clear, THROAT-clear & wnl. NECK:  Supple w/ fairROM; no JVD; normal carotid impulses w/o bruits; no thyromegaly or nodules palpated; no lymphadenopathy. CHEST:  Clear to P & A; without wheezes/ rales/ or rhonchi heard... HEART:  Pacer in L upper chest area ~ nontender, RR, gr 1/6 SEM without rubs or gallops detected... ABDOMEN:  Soft & nontender; normal bowel sounds; no organomegaly or masses  palpated... EXT: without deformities, mild arthritic changes; no varicose veins/ venous insuffic/ or edema. NEURO:  CN's intact; no focal neuro deficits apprec... DERM:  No lesions noted; no rash etc...     MISC. Report  Procedure date:  08/24/2009  Findings:      BMP (METABOL)   Sodium                    139 mEq/L                   135-145   Potassium                 4.4 mEq/L                   3.5-5.1   Chloride                  106 mEq/L                   96-112  Carbon Dioxide            28 mEq/L                    19-32   Glucose                   89 mg/dL                    16-10   BUN                  [H]  24 mg/dL                    9-60   Creatinine                0.9 mg/dL                   4.5-4.0   Calcium                   9.3 mg/dL                   9.8-11.9   GFR                       87.39 mL/min                >60  Lipid Panel (LIPID)   Cholesterol               137 mg/dL                   1-478   Triglycerides             99.0 mg/dL                  2.9-562.1   HDL                  [L]  30.86 mg/dL                 >57.84   LDL Cholesterol           78 mg/dL                    6-96  CBC Platelet w/Diff (CBCD)   White Cell Count          5.5 K/uL                    4.5-10.5   Red Cell Count       [L]  3.95 Mil/uL                 4.22-5.81   Hemoglobin           [L]  12.6 g/dL                   29.5-28.4   Hematocrit           [L]  38.1 %                      39.0-52.0   MCV                       96.5 fl                     78.0-100.0    Platelet  Count            178.0 K/uL                  150.0-400.0   Neutrophil %              62.2 %                      43.0-77.0   Lymphocyte %              21.9 %                      12.0-46.0   Monocyte %                10.3 %                      3.0-12.0   Eosinophils%              5.0 %                       0.0-5.0   Basophils %               0.6 %   Comments:      Hepatic/Liver Function Panel (HEPATIC)   Total  Bilirubin      [H]  1.7 mg/dL                   1.6-1.0   Direct Bilirubin          0.2 mg/dL                   9.6-0.4   Alkaline Phosphatase      56 U/L                      39-117   AST                       26 U/L                      0-37   ALT                       20 U/L                      0-53   Total Protein             7.0 g/dL                    5.4-0.9   Albumin                   4.2 g/dL                    8.1-1.9  TSH (TSH)   FastTSH                   4.88 uIU/mL                 0.35-5.50  Prostate Specific Antigen (PSA)   PSA-Hyb                   0.60 ng/mL                  0.10-4.00  Impression & Recommendations:  Problem # 1:  HYPERTENSION (ICD-401.9) Controlled-  continue same Rx. His updated medication list for this problem includes:    Toprol Xl 100 Mg Tb24 (Metoprolol succinate) .Marland Kitchen... Take 1 tablet by mouth once a day    Altace 10 Mg Caps (Ramipril) .Marland Kitchen... 1 cap by mouth once daily  Problem # 2:  CAD (ICD-414.00) Stable-  no angina, followed by Delton See. His updated medication list for this problem includes:    Adult Aspirin Ec Low Strength 81 Mg Tbec (Aspirin) .Marland Kitchen... 1 by mouth once daily    Plavix 75 Mg Tabs (Clopidogrel bisulfate) .Marland Kitchen... 1 by mouth once daily    Toprol Xl 100 Mg Tb24 (Metoprolol succinate) .Marland Kitchen... Take 1 tablet by mouth once a day    Altace 10 Mg Caps (Ramipril) .Marland Kitchen... 1 cap by mouth once daily  Problem # 3:  HYPERCHOLESTEROLEMIA (ICD-272.0) Stable on diet + meds-  continue same. His updated medication list for this problem includes:    Simvastatin 80 Mg Tabs (Simvastatin) .Marland Kitchen... Take one tablet by mouth at bedtime    Gemfibrozil 600 Mg Tabs (Gemfibrozil) .Marland Kitchen... 1 tab by mouth once daily  Problem # 4:  GERD (ICD-530.81) He uses OTC Rx Prn...  Problem # 5:  DEGENERATIVE JOINT DISEASE (ICD-715.90) Stable-  we discussed exercise program... His updated medication list for this problem includes:    Adult Aspirin Ec Low Strength 81 Mg Tbec  (Aspirin) .Marland Kitchen... 1 by mouth once daily  Problem # 6:  VITAMIN B12 DEFICIENCY (ICD-266.2) Continue B12 monthly...  Problem # 7:  OTHER MEDICAL PROBLEMS AS NOTED>>> OK PNEUMOVAX today...  Complete Medication List: 1)  Adult Aspirin Ec Low Strength 81 Mg Tbec (Aspirin) .Marland Kitchen.. 1 by mouth once daily 2)  Plavix 75 Mg Tabs (Clopidogrel bisulfate) .Marland Kitchen.. 1 by mouth once daily 3)  Toprol Xl 100 Mg Tb24 (Metoprolol succinate) .... Take 1 tablet by mouth once a day 4)  Altace 10 Mg Caps (Ramipril) .Marland Kitchen.. 1 cap by mouth once daily 5)  Simvastatin 80 Mg Tabs (Simvastatin) .... Take one tablet by mouth at bedtime 6)  Gemfibrozil 600 Mg Tabs (Gemfibrozil) .Marland Kitchen.. 1 tab by mouth once daily 7)  Fish Oil 1000 Mg Caps (Omega-3 fatty acids) .Marland Kitchen.. 1 cap by mouth two times a day 8)  Therapeutic Multivitamin Tabs (Multiple vitamin) .Marland Kitchen.. 1 by mouth once daily 9)  Cyanocobalamin 1000 Mcg/ml Inj Soln (Cyanocobalamin) .Marland Kitchen.. 1 ml injected monthly  Other Orders: Prescription Created Electronically [phone]) Admin of Therapeutic Inj  intramuscular or subcutaneous (69629) Pneumococcal Vaccine (52841) Admin 1st Vaccine (32440)  Patient Instructions: 1)  Today we updated your med list- see below.... 2)  We refilled your meds for 2011... 3)  Today we reviewed the results of your recent blood work... 4)  We also gave you the PNEUMONIA vaccine- this will be the last one of these that you will need based on current recommendations.Marland KitchenMarland Kitchen 5)  Stay as active as possible... 6)  Call for any problems.Marland KitchenMarland Kitchen 7)  Please schedule a follow-up appointment in 6 months. Prescriptions: FISH OIL 1000 MG  CAPS (OMEGA-3 FATTY ACIDS) 1 cap by mouth two times a day  #180 x 4   Entered and Authorized by:   Michele Mcalpine MD   Signed by:   Michele Mcalpine MD on 08/30/2009   Method used:   Print then Give to Patient   RxID:   [id number] ALTACE 10 MG  CAPS (RAMIPRIL) 1 cap by mouth once daily  #  90 x 4   Entered and Authorized by:   Michele Mcalpine MD    Signed by:   Michele Mcalpine MD on 08/30/2009   Method used:   Print then Give to Patient   RxID:   [id number] TOPROL XL 100 MG  TB24 (METOPROLOL SUCCINATE) Take 1 tablet by mouth once a day  #90 x 4   Entered and Authorized by:   Michele Mcalpine MD   Signed by:   Michele Mcalpine MD on 08/30/2009   Method used:   Print then Give to Patient   RxID:   [id number] PLAVIX 75 MG  TABS (CLOPIDOGREL BISULFATE) 1 by mouth once daily  #90 x 4   Entered and Authorized by:   Michele Mcalpine MD   Signed by:   Michele Mcalpine MD on 08/30/2009   Method used:   Print then Give to Patient   RxID:   [id number]    Medication Administration  Injection # 1:    Medication: Vit B12 1000 mcg    Diagnosis: UNSPECIFIED ANEMIA (ICD-285.9)    Route: IM    Site: L deltoid    Exp Date: 04/17/2011    Lot #: [id number]    Mfr: American Regent    Patient tolerated injection without complications    Given by: Randell Loop CMA (August 30, 2009 3:03 PM)  Orders Added: 1)  Prescription Created Electronically [phone] 2)  Est. Patient Level IV [64403] 3)  Admin of Therapeutic Inj  intramuscular or subcutaneous [96372] 4)  Pneumococcal Vaccine [90732] 5)  Admin 1st Vaccine [47425]    Immunizations Administered:  Pneumonia Vaccine:    Vaccine Type: Pneumovax    Site: right deltoid    Mfr: Merck    Dose: 0.5 ml    Given by: Randell Loop CMA    Exp. Date: 09/01/2010    Lot #: [id number]    VIS given: 03/03/96 version given August 30, 2009.

## 2010-09-07 NOTE — Letter (Signed)
Summary: Remote Device Check  [facility], Main Office  [address]   [place], [place] [address]   Phone: [phone]  Fax: [phone]     August 23, 2009 MRN: [id number]   [name] [address] [place], [place]  [address]   Dear [name],   Your remote transmission was recieved and reviewed by your physician.  All diagnostics were within normal limits for you.  __X___Your next transmission is scheduled for: October 24, 2009.  Please transmit at any time this day.  If you have a wireless device your transmission will be sent automatically.      Sincerely,  Proofreader

## 2010-09-07 NOTE — Progress Notes (Signed)
Summary: appt for labs prior to dr appt  Phone Note Call from Patient   Caller: daughter/shelia clark Call For: dr Kriste Basque Summary of Call: Patients daughter phoned pt has an appointment on 09/05/10 and would like to come in for the labs the week prior she would like to have this scheduled please. Ms Chestine Spore said to please leave a message on her fathers home at [phone] Initial call taken by: Vedia Coffer,  August 24, 2010 11:37 AM  Follow-up for Phone Call        Pls advise what labs to order thanks Vernie Murders  August 24, 2010 11:41 AM    per SN---ok for labs ---labs are in the computer for 1-23 and i called and lmom for pt and his daughter to make them aware Randell Loop [facility]  August 24, 2010 3:48 PM

## 2010-09-07 NOTE — Letter (Signed)
Summary: Remote Device Check  [facility], Main Office  [address]   [place], [place] [address]   Phone: [phone]  Fax: [phone]     November 09, 2009 MRN: [id number]   [name] [address] [place], [place]  [address]   Dear [name],   Your remote transmission was recieved and reviewed by your physician.  All diagnostics were within normal limits for you.    ___X___Your next office visit is scheduled for:     Dec 27, 2009 AT 4PM WITH DR Ladona Ridgel.     Sincerely,  Proofreader

## 2010-09-07 NOTE — Cardiovascular Report (Signed)
Summary: Office Visit   Office Visit   Imported By: Roderic Ovens 01/20/2010 14:51:49  _____________________________________________________________________  External Attachment:    Type:   Image     Comment:   External Document

## 2010-09-07 NOTE — Cardiovascular Report (Signed)
Summary: Office Visit Remote   Office Visit Remote   Imported By: Roderic Ovens 11/11/2009 14:55:05  _____________________________________________________________________  External Attachment:    Type:   Image     Comment:   External Document

## 2010-09-07 NOTE — Miscellaneous (Signed)
  Clinical Lists Changes  Observations: Added new observation of PAST MED HX: HYPERTENSION (ICD-401.9) CAD (ICD-414.00..DES 2005 .Marland KitchenLAD / DES..2008  .Marland KitchenMarland KitchenLAD (new) LBBB ISCHEMIC CARDIOMYOPATHY (ICD-414.8) EF 30%..echo..12/2003  /  30% cath...2008 Mitral regurgitation...mild  echo..12/2003 ICD...2005 ..for syncope and arrythmia.Marland Kitchen / feels better with rate response mode off /  generator changed 6/11 SYNCOPE (ICD-780.2)...2005.. received ICD AV BLOCK, COMPLETE (ICD-426.0) HYPERCHOLESTEROLEMIA (ICD-272.0) GERD (ICD-530.81) DEGENERATIVE JOINT DISEASE (ICD-715.90) VITAMIN B12 DEFICIENCY (ICD-266.2)  (05/09/2010 11:44) Added new observation of PRIMARY MD: Dr.Scott Nadel (05/09/2010 11:44)       Past History:  Past Medical History: HYPERTENSION (ICD-401.9) CAD (ICD-414.00..DES 2005 .Marland KitchenLAD / DES..2008  .Marland KitchenMarland KitchenLAD (new) LBBB ISCHEMIC CARDIOMYOPATHY (ICD-414.8) EF 30%..echo..12/2003  /  30% cath...2008 Mitral regurgitation...mild  echo..12/2003 ICD...2005 ..for syncope and arrythmia.Marland Kitchen / feels better with rate response mode off /  generator changed 6/11 SYNCOPE (ICD-780.2)...2005.. received ICD AV BLOCK, COMPLETE (ICD-426.0) HYPERCHOLESTEROLEMIA (ICD-272.0) GERD (ICD-530.81) DEGENERATIVE JOINT DISEASE (ICD-715.90) VITAMIN B12 DEFICIENCY (ICD-266.2)

## 2010-09-07 NOTE — Assessment & Plan Note (Signed)
Summary: b-12/apc  Nurse Visit   Allergies: No Known Drug Allergies  Medication Administration  Injection # 1:    Medication: Vit B12 1000 mcg    Diagnosis: UNSPECIFIED ANEMIA (ICD-285.9)    Route: SQ    Site: L deltoid    Exp Date: 04/07/2011    Lot #: [id number]    Mfr: American Regent    Comments: 1000 micrograms b-12 in left arm charged [phone] and [id number]    Patient tolerated injection without complications    Given by: tammy Aveer [place]ow in allergy lab   Medication Administration  Injection # 1:    Medication: Vit B12 1000 mcg    Diagnosis: UNSPECIFIED ANEMIA (ICD-285.9)    Route: SQ    Site: L deltoid    Exp Date: 04/07/2011    Lot #: [id number]    Mfr: [employer] Regent    Comments: 1000 micrograms b-12 in left arm charged [phone] and [id number]    Patient tolerated injection without complications    Given by: tammy Carely Nappier in allergy lab

## 2010-09-07 NOTE — Cardiovascular Report (Signed)
Summary: Pre Op Orders  Pre Op Orders   Imported By: Roderic Ovens 02/03/2010 11:31:03  _____________________________________________________________________  External Attachment:    Type:   Image     Comment:   External Document

## 2010-09-07 NOTE — Assessment & Plan Note (Signed)
Summary: defib check.[email or web address]   Visit Type:  Follow-up Primary Provider:  Dr.Scott Kriste Basque   History of Present Illness: [name] returns today for followup.  He is a pleasant 77 yo man with a h/o HTN, CAD, CHB, s/p ICD who returns for ICD followup as he is at [facility].  The patient has had no significant CHF symptoms and remains class 1-2 despite severe LV dysfunction and RV pacing.  He has not had any intercurrent ICD therapies.  Current Medications (verified): 1)  Adult Aspirin Ec Low Strength 81 Mg  Tbec (Aspirin) .Marland Kitchen.. 1 By Mouth Once Daily 2)  Plavix 75 Mg  Tabs (Clopidogrel Bisulfate) .Marland Kitchen.. 1 By Mouth Once Daily 3)  Toprol Xl 100 Mg  Tb24 (Metoprolol Succinate) .... Take 1 Tablet By Mouth Once A Day 4)  Altace 10 Mg  Caps (Ramipril) .Marland Kitchen.. 1 Cap By Mouth Once Daily 5)  Simvastatin 80 Mg  Tabs (Simvastatin) .... Take One Tablet By Mouth At Bedtime 6)  Gemfibrozil 600 Mg  Tabs (Gemfibrozil) .Marland Kitchen.. 1 Tab By Mouth Once Daily 7)  Fish Oil 1000 Mg  Caps (Omega-3 Fatty Acids) .Marland Kitchen.. 1 Cap By Mouth Two Times A Day 8)  Therapeutic Multivitamin   Tabs (Multiple Vitamin) .Marland Kitchen.. 1 By Mouth Once Daily 9)  Cyanocobalamin 1000 Mcg/ml Inj Soln (Cyanocobalamin) .Marland Kitchen.. 1 Ml Injected Monthly  Allergies (verified): No Known Drug Allergies  Past History:  Past Medical History: Last updated: 08/30/2009 HYPERTENSION (ICD-401.9) CAD (ICD-414.00..DES 2005 .Marland KitchenLAD / DES..2008  .Marland KitchenMarland KitchenLAD (new) LBBB ISCHEMIC CARDIOMYOPATHY (ICD-414.8) EF 30%..echo..12/2003 Mitral regurgitation...mild  echo..12/2003 ICD...2005 ..for syncope and arrythmia.Marland Kitchen / feels better with tate response mode off SYNCOPE (ICD-780.2)...2005.. received ICD AV BLOCK, COMPLETE (ICD-426.0)  HYPERCHOLESTEROLEMIA (ICD-272.0) GERD (ICD-530.81) DEGENERATIVE JOINT DISEASE (ICD-715.90) VITAMIN B12 DEFICIENCY (ICD-266.2)  Past Surgical History: Last updated: 08/30/2009 S/P ICD Implanted S/P laparoscopic cholecystectomy 9/06 by [facility]  Review of  Systems  The patient denies chest pain, syncope, dyspnea on exertion, and peripheral edema.    Vital Signs:  Patient profile:   77 year old male Height:      71 inches Weight:      172 pounds BMI:     24.08 Pulse rate:   56 / minute BP sitting:   118 / 70  (left arm)  Vitals Entered By: Laurance Flatten CMA (Dec 27, 2009 3:49 PM)  Physical Exam  General:  patient looks great.  He does not appear to have aged one day. Head:  normocephalic and atraumatic Eyes:  no xanthelasma. Mouth:  Teeth, gums and palate normal. Oral mucosa normal. Neck:  no jugular venous distention.  No carotid bruits. Chest Wall:  Well healed ICD incision. Lungs:  lungs are clear.  Respiratory effort is nonlabored. Heart:  cardiac exam reveals an S1-S2.  There are no clicks or significant murmurs. S2 is split. Abdomen:  abdomen is soft. Msk:  no musculoskeletal deformities. Pulses:  pulses normal in all 4 extremities Extremities:  no peripheral edema. Neurologic:  Alert and oriented x 3.    ICD Specifications Following MD:  Lewayne Bunting, MD     ICD Vendor:  [facility] Jude     ICD Model Number:  [id number]     ICD Serial Number:  [id number] ICD DOI:  12/13/2003     ICD Implanting MD:  Lewayne Bunting, MD  Lead 1:    Location: RA     DOI: 12/13/2003     Model #: 1488TC     Serial #: [id number]  Status: active Lead 2:    Location: RV     DOI: 12/13/2003     Model #: 1580     Serial #: [id number]     Status: active  Indications::  NICM   ICD Follow Up Battery Voltage:  2.47 V     Charge Time:  12.6 seconds     Underlying rhythm:  PACED ICD Dependent:  Yes       ICD Device Measurements Atrium:  Amplitude: 3.0 mV, Impedance: 410 ohms, Threshold: 0.75 V at 0.5 msec Right Ventricle:  Impedance: 415 ohms, Threshold: 0.75 V at 0.5 msec  Episodes MS Episodes:  0     Percent Mode Switch:  0     Coumadin:  No Shock:  0     ATP:  0     Nonsustained:  0     Atrial Therapies:  0 Atrial Pacing:  34%     Ventricular Pacing:  98%  Brady  Parameters Mode DDD     Lower Rate Limit:  50     Upper Rate Limit 100 PAV 180     Sensed AV Delay:  160  Tachy Zones VF:  200     VT:  160     Tech Comments:  BATTERY VOLTAGE 2.47 --DR Ladona Ridgel DISCUSSED CHANGE OUT W/PT. TURNED SENSOR ON.  NORMAL DEVICE FUNCTION.  Vella Kohler  Dec 27, 2009 4:14 PM MD Comments:  Agree with above.  He is essentially ERI.  Will schedule ICD generator change.  Impression & Recommendations:  Problem # 1:  CHRONIC SYSTOLIC HEART FAILURE (ICD-428.22) He will continue his current meds.  He will maintain a low sodium diet.  As he has class 1-2 symptoms, I have not insisted that he upgrade to a BiV ICD, though this option has been discussed. His updated medication list for this problem includes:    Adult Aspirin Ec Low Strength 81 Mg Tbec (Aspirin) .Marland Kitchen... 1 by mouth once daily    Plavix 75 Mg Tabs (Clopidogrel bisulfate) .Marland Kitchen... 1 by mouth once daily    Toprol Xl 100 Mg Tb24 (Metoprolol succinate) .Marland Kitchen... Take 1 tablet by mouth once a day    Altace 10 Mg Caps (Ramipril) .Marland Kitchen... 1 cap by mouth once daily  Problem # 2:  AUTOMATIC IMPLANTABLE CARDIAC DEFIBRILLATOR SITU (ICD-V45.02) His device is working normally but is at [employer].  I have discussed the risks/benefits/ goals and indications for ICD generator change and he wishes to proceed.  Problem # 3:  HYPERCHOLESTEROLEMIA (ICD-272.0) A low fat diet is recommended. His updated medication list for this problem includes:    Simvastatin 80 Mg Tabs (Simvastatin) .Marland Kitchen... Take one tablet by mouth at bedtime    Gemfibrozil 600 Mg Tabs (Gemfibrozil) .Marland Kitchen... 1 tab by mouth once daily

## 2010-09-07 NOTE — Assessment & Plan Note (Signed)
Summary: b-12/apc  Nurse Visit   Allergies: No Known Drug Allergies  Medication Administration  Injection # 1:    Medication: Vit B12 1000 mcg    Diagnosis: UNSPECIFIED ANEMIA (ICD-285.9)    Route: SQ    Site: L deltoid    Exp Date: 04/09/2011    Lot #: [id number]    Mfr: GENENTECH  Orders Added: 1)  Vit B12 1000 mcg [J3420] 2)  Admin of Therapeutic Inj  intramuscular or subcutaneous [96372]   Medication Administration  Injection # 1:    Medication: Vit B12 1000 mcg    Diagnosis: UNSPECIFIED ANEMIA (ICD-285.9)    Route: SQ    Site: L deltoid    Exp Date: 04/09/2011    Lot #: [id number]    Mfr: GENENTECH  Orders Added: 1)  Vit B12 1000 mcg [J3420] 2)  Admin of Therapeutic Inj  intramuscular or subcutaneous [[id number]]

## 2010-09-07 NOTE — Miscellaneous (Signed)
Summary: Device change out  Clinical Lists Changes  Observations: Added new observation of ICD IMPL DTE: 01/12/2010 (01/16/2010 9:58) Added new observation of ICD SERL#: [id number]  (01/16/2010 9:58) Added new observation of ICD MODL#: [id number]  (01/16/2010 0:45) Added new observation of ICDEXPLCOMM: 01/12/10 St. Jude Atlas [id number] explanted.  (01/16/2010 9:58)       ICD Specifications Following MD:  Lewayne Bunting, MD     ICD Vendor:  [employer] Jude     ICD Model Number:  [id number]     ICD Serial Number:  [id number] ICD DOI:  01/12/2010     ICD Implanting MD:  Lewayne Bunting, MD  Lead 1:    Location: RA     DOI: 12/13/2003     Model #: [id number]     Serial #: [id number]     Status: active Lead 2:    Location: RV     DOI: 12/13/2003     Model #: 1580     Serial #: [id number]     Status: active  Indications::  NICM  Explantation Comments: 01/12/10 St. Jude Atlas [id number] explanted.  ICD Follow Up ICD Dependent:  Yes      Episodes Coumadin:  No  Brady Parameters Mode DDD     Lower Rate Limit:  50     Upper Rate Limit 100 PAV 180     Sensed AV Delay:  160  Tachy Zones VF:  200     VT:  160

## 2010-09-07 NOTE — Assessment & Plan Note (Signed)
Summary: b-12/apc  Nurse Visit   Allergies: No Known Drug Allergies  Medication Administration  Injection # 1:    Medication: Vit B12 1000 mcg    Diagnosis: UNSPECIFIED ANEMIA (ICD-285.9)    Route: SQ    Site: L deltoid    Exp Date: 04/2011    Lot #: [id number]    Mfr: American Regent    Comments: Injection given by Dimas Millin in allergy lab.     Patient tolerated injection without complications  Orders Added: 1)  Admin of Therapeutic Inj  intramuscular or subcutaneous [96372] 2)  Vit B12 1000 mcg [J3420]

## 2010-09-07 NOTE — Progress Notes (Signed)
Summary: prescript  Phone Note Call from Patient   Caller: Patient Call For: Cayce Paschal Summary of Call: pt did not get prescripts for smbastatin 80 and gemfibrozil 600mg  . pls mail to him  Initial call taken by: Rickard Patience,  August 30, 2009 3:37 PM  Follow-up for Phone Call        rxs placed on SN's cart for lookat Vernie Murders  August 30, 2009 3:41 PM    rx have been signed by [facility] and placed in the mail to pt per pts request. Randell Loop CMA  August 30, 2009 4:02 PM     Prescriptions: SIMVASTATIN 80 MG  TABS (SIMVASTATIN) take one tablet by mouth at bedtime  #90 x 4   Entered by:   Vernie Murders   Authorized by:   Michele Mcalpine MD   Signed by:   Vernie Murders on 08/30/2009   Method used:   Print then Give to Patient   RxID:   [id number] GEMFIBROZIL 600 MG  TABS (GEMFIBROZIL) 1 tab by mouth once daily  #90 x 4   Entered by:   Vernie Murders   Authorized by:   Michele Mcalpine MD   Signed by:   Vernie Murders on 08/30/2009   Method used:   Print then Give to Patient   RxID:   [id number]

## 2010-09-07 NOTE — Letter (Signed)
Summary: Implantable Device Instructions  Architectural technologist, Main Office  [address]   [place], [place] [address]   Phone: [phone]  Fax: [phone]      Implantable Device Instructions  You are scheduled for:   _____ Generator Change  on 01/12/10 with Dr.Taylor.  1.  Please arrive at the Short Stay Center at [facility] at 5:30am on the day of your procedure.  2.  Do not eat or drink after midnight the night before your procedure.  3.  Complete lab work on 01/06/10 at 4:00pm.   You do not have to be fasting.  4.  Do NOT take these medications for 3 days prior to your procedure:  Plavix.  5.  Plan for an overnight stay.  Bring your insurance cards and a list of your medications.  6.  Wash your chest and neck with antibacterial soap (any brand) the evening before and the morning of your procedure.  Rinse well.    *If you have ANY questions after you get home, please call the office [phone]. Anselm Pancoast  *Every attempt is made to prevent procedures from being rescheduled.  Due to the nauture of Electrophysiology, rescheduling can happen.  The physician is always aware and directs the staff when this occurs.

## 2010-09-07 NOTE — Assessment & Plan Note (Signed)
Summary: 6 months/b-12/apc   Primary Care Provider:  Dr.Sylvie Mifsud  CC:  6 month ROV & review of mult medical problems....  History of Present Illness: 77 y/o WM here for a follow up visit & review of mult medical problems...   ~  Jul10:  he states he's been doing well and has no complaints or concerns today... 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 he saw Delton See for a Cardiology f/u in Oct09- doing well, no change in meds.   ~  August 30, 2009:  he continues to do well- no new complaints or concerns... BP controlled on meds; he saw DrKatz for f/u 10/10- doing well, no CP, mild MR, ICD in place, no change in Rx... Chol controlled on Simva + Gemfib + Fish Oil; still getting B12 shots monthly; has Flu shot 10/10 & due for PNEUMOVAX now.   ~  March 08, 2010:  feels well- no new complaints or concerns... he had ICD generator changed by drTaylor 6/11- doing well post op... as noted he has CAD, ischemic cardiomyopathy, CHB w/ pacer/ ICD, he has class I-II symptoms and still works daily at [employer]... we discussed recheck FLP & consider decr dose of Simva based on new guidelines (he will ret for FLP).    Current Problem List:  HYPERTENSION (ICD-401.9) - controlled on TOPROL XL 100mg /d & ALTACE 10mg /d... takes meds regularly and tolerates well... BP 120/60 here, and OK at home per pt... he gets rare HA which he believes is related to his BP... denies fatigue, visual changes, CP, palipit, dizziness, syncope, dyspnea, edema, etc...  CAD (ICD-414.00) - he's had prev MI w/ mult caths/ interventions... on ASA 81mg /d & PLAVIX 75mg /d.  ~  last cath 5/08 showed norm Lmain, patent stent in prox LAD, 80-90% lesion in mid-LAD w/ PCI & stent placed, & 50% ostial stenosis of 1st diagonal branch of LAD; 40%mid & 70%distal CIRC; 50%prox 40%mid 40%distal RCA; Global HK w/EF=30%...  ~  NuclearStressTest 4/09 showed infer scar, no ischemia,  EF=40%...  ~  10/10: yearly f/u DrKatz- doing well, no CP, mild MR, ICD in place, no change in Rx...   ISCHEMIC CARDIOMYOPATHY (ICD-414.8) - s/p pacer/ AICD... EF= 30% on cath 5/08... followed closely by [facility] as above...  Hx of SYNCOPE (ICD-780.2) & AV BLOCK, COMPLETE (ICD-426.0) - Pacer/ ICD per DrTaylor w/ several adjustments in 2008...  ~  5/10:  senn by DrTaylor & stable, f/u 46yr.  ~  5/11:  f/u DrTaylor w/ decision for elective generator change> done 6/11 & tol well.  HYPERCHOLESTEROLEMIA (ICD-272.0) - takes SIMVASTATIN 80mg /d, GEMFIBRIZOL 600mg /d, & FISH OIL Bid...  ~  FLP 08/14/07 on Simva40 showed TChol 163, TG 94, HDL 34, LDL 111... rec incr Simvastatin to 80mg /d.  ~  FLP 02/24/08 on Simva80 showed TChol 136, TG 92, HDL 38, LDL 80... continue same.  ~  FLP 1/10 showed TChol 125, TG 67, HDL 32, LDL 80... rec- continue same.  ~  FLP 7/10 showed TChol 124, TG 86, HDL 36, LDL 71  ~  FLP 1/11 on Simva80+Gemfib600 showed TChol 137, TG 99, HDL 39, LDL 78  ~  FLP 8/11 on Simva80+Gemfib600 showed =  GERD (ICD-530.81) - he had ERCP in 8/06 w/ norm ducts, +gallstones, s/p lap chole 2006... he uses OTC meds Prn...  DEGENERATIVE JOINT DISEASE (ICD-715.90) - he tried Mobic but it bothered his stomach, therefore uses Tylenol Prn.  VITAMIN B12 DEFICIENCY (ICD-266.2) - old chart not available- ? details of this Dx... on B12 shots xyrs.  ~  labs 1/09 showed Hg=12.9  ~  labs 1/10 showed Hg= 12.7  ~  labs 7/10 showed Hg= 12.7  ~  labs 1/11 showed Hg= 12.6   Preventive Screening-Counseling & Management  Alcohol-Tobacco     Smoking Status: quit     Year Quit: 1952  Allergies (verified): No Known Drug Allergies  Comments:  Nurse/Medical Assistant: The patient's medications and allergies were reviewed with the patient and were updated in the Medication and Allergy Lists.  Past History:  Past Medical History: HYPERTENSION (ICD-401.9) CAD (ICD-414.00..DES 2005 .Marland KitchenLAD / DES..2008  .Marland KitchenMarland KitchenLAD  (new) LBBB ISCHEMIC CARDIOMYOPATHY (ICD-414.8) EF 30%..echo..12/2003 Mitral regurgitation...mild  echo..12/2003 ICD...2005 ..for syncope and arrythmia.Marland Kitchen / feels better with tate response mode off generator changed 6/11 SYNCOPE (ICD-780.2)...2005.. received ICD AV BLOCK, COMPLETE (ICD-426.0)  HYPERCHOLESTEROLEMIA (ICD-272.0) GERD (ICD-530.81) DEGENERATIVE JOINT DISEASE (ICD-715.90) VITAMIN B12 DEFICIENCY (ICD-266.2)  Past Surgical History: S/P ICD Implanted S/P laparoscopic cholecystectomy 9/06 by [facility]  Family History: Reviewed history from 09/01/2008 and no changes required. Mother died age 75 w/ ? ischemic bowel. Father died age 18 w/ heart problems. 6 siblings: 4 Brothers: one died age 77 w/ MI; one had CABG; one had lung cancer surg. 2 Sisters: one w/ pacer  Social History: Reviewed history from 09/01/2008 and no changes required. quit smoking in 1954 no etoh widowed 2 children repairs looms at [employer]  Review of Systems      See HPI       The patient complains of dyspnea on exertion.  The patient denies anorexia, fever, weight loss, weight gain, vision loss, decreased hearing, hoarseness, chest pain, syncope, peripheral edema, prolonged cough, headaches, hemoptysis, abdominal pain, melena, hematochezia, severe indigestion/heartburn, hematuria, incontinence, muscle weakness, suspicious skin lesions, transient blindness, difficulty walking, depression, unusual weight change, abnormal bleeding, enlarged lymph nodes, and angioedema.    Vital Signs:  Patient profile:   77 year old male Height:      71 inches Weight:      169.38 pounds BMI:     23.71 O2 Sat:      97 % on Room air Temp:     97.0 degrees F oral Pulse rate:   50 / minute BP sitting:   120 / 60  (left arm) Cuff size:   regular  Vitals Entered By: Randell Loop CMA (March 08, 2010 2:17 PM)  O2 Sat at Rest %:  97 O2 Flow:  Room air CC: 6 month ROV & review of mult medical problems... Is Patient  Diabetic? No Pain Assessment Patient in pain? no      Comments no changes in meds today   Physical Exam  Additional Exam:  WD, WN, 77 y/o WM in NAD... wt=170# ht=6'1" BMI=22... GENERAL:  Alert & oriented; pleasant & cooperative... HEENT:  [place]/AT, EOM-wnl, PERRLA, EACs-clear, TMs-wnl, NOSE-clear, THROAT-clear & wnl. NECK:  Supple w/ fairROM; no JVD; normal carotid impulses w/o bruits; no thyromegaly or nodules palpated; no lymphadenopathy. CHEST:  Clear to P & A; without wheezes/ rales/ or rhonchi heard... HEART:  Pacer in L upper chest area ~ nontender, RR, gr 1/6 SEM without rubs or gallops detected... ABDOMEN:  Soft & nontender; normal bowel sounds; no organomegaly or masses palpated... EXT: without deformities, mild arthritic changes; no varicose veins/ venous insuffic/ or edema. NEURO:  CN's intact; no focal neuro deficits apprec... DERM:  No lesions noted; no rash etc..Marland Kitchen  MISC. Report  Procedure date:  03/08/2010  Findings:      Pt will return for Fasting labs & we will review & notify him w/ results and any med changes (poss decr the simva dose)...  SN   ~  reviewed the EMR note by DrTaylor 12/27/09 & Op note 16/9/11...    Impression & Recommendations:  Problem # 1:  HYPERTENSION (ICD-401.9) Controlled>  same meds. His updated medication list for this problem includes:    Toprol Xl 100 Mg Tb24 (Metoprolol succinate) .Marland Kitchen... Take 1 tablet by mouth once a day    Altace 10 Mg Caps (Ramipril) .Marland Kitchen... 1 cap by mouth once daily  Problem # 2:  CHRONIC SYSTOLIC HEART FAILURE (ICD-428.22) He is CAD, Ischemic Cardiomyopathy w/ EF  ~30% when last measured, but only class I-II symptoms and still works for [employer] daily at age 5... smae meds, no changes... His updated medication list for this problem includes:    Adult Aspirin Ec Low Strength 81 Mg Tbec (Aspirin) .Marland Kitchen... 1 by mouth once daily    Plavix 75 Mg Tabs (Clopidogrel bisulfate) .Marland Kitchen... 1 by mouth once daily    Toprol Xl  100 Mg Tb24 (Metoprolol succinate) .Marland Kitchen... Take 1 tablet by mouth once a day    Altace 10 Mg Caps (Ramipril) .Marland Kitchen... 1 cap by mouth once daily  Problem # 3:  AV BLOCK, COMPLETE (ICD-426.0) Pacer generator changed by drTaylor 6/11 as noted...  Problem # 4:  HYPERCHOLESTEROLEMIA (ICD-272.0) Pt to ret for FLP & we will likely decr his simva to 40mg /d... His updated medication list for this problem includes:    Simvastatin 80 Mg Tabs (Simvastatin) .Marland Kitchen... Take one tablet by mouth at bedtime    Gemfibrozil 600 Mg Tabs (Gemfibrozil) .Marland Kitchen... 1 tab by mouth once daily  Problem # 5:  OTHER MEDICAL PROBLEMS AS NOTED>>>  Complete Medication List: 1)  Adult Aspirin Ec Low Strength 81 Mg Tbec (Aspirin) .Marland Kitchen.. 1 by mouth once daily 2)  Plavix 75 Mg Tabs (Clopidogrel bisulfate) .Marland Kitchen.. 1 by mouth once daily 3)  Toprol Xl 100 Mg Tb24 (Metoprolol succinate) .... Take 1 tablet by mouth once a day 4)  Altace 10 Mg Caps (Ramipril) .Marland Kitchen.. 1 cap by mouth once daily 5)  Simvastatin 80 Mg Tabs (Simvastatin) .... Take one tablet by mouth at bedtime 6)  Gemfibrozil 600 Mg Tabs (Gemfibrozil) .Marland Kitchen.. 1 tab by mouth once daily 7)  Fish Oil 1000 Mg Caps (Omega-3 fatty acids) .Marland Kitchen.. 1 cap by mouth two times a day 8)  Therapeutic Multivitamin Tabs (Multiple vitamin) .Marland Kitchen.. 1 by mouth once daily 9)  Cyanocobalamin 1000 Mcg/ml Inj Soln (Cyanocobalamin) .Marland Kitchen.. 1 ml injected monthly  Patient Instructions: 1)  Today we updated your med list- see below.... 2)  Continue your current meds the same for now... 3)  Please return to our lab one morning this week for your FASTING blood work... we may be able to decr the Simvastatin to 40mg /d... please call the "phone tree" in a few days for your lab results.Marland KitchenMarland Kitchen 4)  Call for any questions.Marland KitchenMarland Kitchen 5)  Please schedule a follow-up appointment in 6 months.   Immunization History:  Influenza Immunization History:    Influenza:  historical (06/21/2009)

## 2010-09-07 NOTE — Cardiovascular Report (Signed)
Summary: Office Visit Remote   Office Visit Remote   Imported By: Roderic Ovens 06/09/2010 09:55:04  _____________________________________________________________________  External Attachment:    Type:   Image     Comment:   External Document

## 2010-09-07 NOTE — Assessment & Plan Note (Signed)
Summary: b-12/apc  Nurse Visit   Allergies: No Known Drug Allergies  Medication Administration  Injection # 1:    Medication: Vit B12 1000 mcg    Diagnosis: UNSPECIFIED ANEMIA (ICD-285.9)    Route: SQ    Site: L deltoid    Exp Date: 12/2011    Lot #: [id number]    Mfr: American Regent    Comments: 1000 mg b-12    Given by: tammy scott in allergy lab  Orders Added: 1)  Admin of Injection (IM/SQ) [[id number]] 2)  Vit B12 1000 mcg [J3420]   Medication Administration  Injection # 1:    Medication: Vit B12 1000 mcg    Diagnosis: UNSPECIFIED ANEMIA (ICD-285.9)    Route: SQ    Site: L deltoid    Exp Date: 12/2011    Lot #: [id number]    Mfr: American Regent    Comments: 1000 mg b-12    Given by: tammy scott in allergy lab  Orders Added: 1)  Admin of Injection (IM/SQ) [[id number]] 2)  Vit B12 1000 mcg [J3420]

## 2010-09-07 NOTE — Assessment & Plan Note (Signed)
Summary: b-12/apc  Nurse Visit   Vitals Entered By: Clarise Cruz Duncan Dull) (April 07, 2010 9:43 AM)  Allergies: No Known Drug Allergies  Medication Administration  Injection # 1:    Medication: Vit B12 1000 mcg    Diagnosis: UNSPECIFIED ANEMIA (ICD-285.9)    Route: IM    Site: L deltoid    Exp Date: 09/07/2011    Lot #: [id number]    Mfr: [employer]ech    Comments: vitamin b-12 1000 micrograms in left deltoid Patient tolerated injection just fine.    Given by: Tammy Scott, Allergy tech  Orders Added: 1)  Vit B12 1000 mcg [J3420] 2)  Admin of Therapeutic Inj  intramuscular or subcutaneous [04540]

## 2010-09-07 NOTE — Assessment & Plan Note (Signed)
Summary: pc2   Visit Type:  Follow-up Primary Provider:  Dr.Scott Kriste Basque   History of Present Illness: [name] returns today for followup.  He is a pleasant 77 yo man with a h/o HTN, CAD, CHB, s/p ICD who returns for ICD followup.  He has done well since his ICD was changed out several months ago.  The patient has had no significant CHF symptoms and remains class 1-2 despite severe LV dysfunction and RV pacing.  He has not had any intercurrent ICD therapies.  Current Medications (verified): 1)  Plavix 75 Mg  Tabs (Clopidogrel Bisulfate) .Marland Kitchen.. 1 By Mouth Once Daily 2)  Toprol Xl 100 Mg  Tb24 (Metoprolol Succinate) .... Take 1 Tablet By Mouth Once A Day 3)  Altace 10 Mg  Caps (Ramipril) .Marland Kitchen.. 1 Cap By Mouth Once Daily 4)  Adult Aspirin Ec Low Strength 81 Mg  Tbec (Aspirin) .Marland Kitchen.. 1 By Mouth Once Daily 5)  Therapeutic Multivitamin   Tabs (Multiple Vitamin) .Marland Kitchen.. 1 By Mouth Once Daily 6)  Cyanocobalamin 1000 Mcg/ml Inj Soln (Cyanocobalamin) .Marland Kitchen.. 1 Ml Injected Monthly 7)  Gemfibrozil 600 Mg  Tabs (Gemfibrozil) .Marland Kitchen.. 1 Tab By Mouth Once Daily 8)  Simvastatin 80 Mg  Tabs (Simvastatin) .... Take One Tablet By Mouth At Bedtime 9)  Fish Oil 1000 Mg  Caps (Omega-3 Fatty Acids) .Marland Kitchen.. 1 Cap By Mouth Two Times A Day  Allergies (verified): No Known Drug Allergies  Past History:  Past Medical History: Last updated: 03/08/2010 HYPERTENSION (ICD-401.9) CAD (ICD-414.00..DES 2005 .Marland KitchenLAD / DES..2008  .Marland KitchenMarland KitchenLAD (new) LBBB ISCHEMIC CARDIOMYOPATHY (ICD-414.8) EF 30%..echo..12/2003 Mitral regurgitation...mild  echo..12/2003 ICD...2005 ..for syncope and arrythmia.Marland Kitchen / feels better with tate response mode off generator changed 6/11 SYNCOPE (ICD-780.2)...2005.. received ICD AV BLOCK, COMPLETE (ICD-426.0)  HYPERCHOLESTEROLEMIA (ICD-272.0) GERD (ICD-530.81) DEGENERATIVE JOINT DISEASE (ICD-715.90) VITAMIN B12 DEFICIENCY (ICD-266.2)  Past Surgical History: Last updated: 03/08/2010 S/P ICD Implanted S/P  laparoscopic cholecystectomy 9/06 by [facility]  Review of Systems  The patient denies chest pain, syncope, dyspnea on exertion, and peripheral edema.    Vital Signs:  Patient profile:   77 year old male Height:      71 inches Weight:      170 pounds BMI:     23.80 Pulse rate:   50 / minute BP sitting:   120 / 60  (left arm)  Vitals Entered By: [name] CMA (May 02, 2010 4:29 PM)  Physical Exam  General:  patient looks great.  He does not appear to have aged one day. Head:  normocephalic and atraumatic Eyes:  no xanthelasma. Mouth:  Teeth, gums and palate normal. Oral mucosa normal. Neck:  no jugular venous distention.  No carotid bruits. Chest Wall:  Well healed ICD incision. Lungs:  lungs are clear.  Respiratory effort is nonlabored. Heart:  cardiac exam reveals an S1-S2.  There are no clicks or significant murmurs. S2 is split. Abdomen:  abdomen is soft. Msk:  no musculoskeletal deformities. Pulses:  pulses normal in all 4 extremities Extremities:  no peripheral edema. Neurologic:  Alert and oriented x 3.    ICD Specifications Following MD:  Lewayne Bunting, MD     ICD Vendor:  [facility] Jude     ICD Model Number:  [id number]     ICD Serial Number:  [id number] ICD DOI:  01/12/2010     ICD Implanting MD:  Lewayne Bunting, MD  Lead 1:    Location: RA     DOI: 12/13/2003     Model #: [id number]  Serial #: [id number]     Status: active Lead 2:    Location: RV     DOI: 12/13/2003     Model #: 1580     Serial #: [id number]     Status: active  Indications::  NICM  Explantation Comments: 01/12/10 St[employer] Jude Atlas [id number] explanted.  ICD Follow Up Remote Check?  No Charge Time:  8.1 seconds     Battery Est. Longevity:  8 years Underlying rhythm:  dependent ICD Dependent:  Yes       ICD Device Measurements Atrium:  Amplitude: 2.2 mV, Impedance: 400 ohms, Threshold: 0.75 V at 0.5 msec Right Ventricle:  Impedance: 340 ohms, Threshold: 0.625 V at 0.5 msec Shock Impedance: 37 ohms    Episodes MS Episodes:  1     Percent Mode Switch:  <1%     Coumadin:  No Shock:  0     ATP:  0     Nonsustained:  0     Atrial Pacing:  37%     Ventricular Pacing:  <1%  Brady Parameters Mode DDD     Lower Rate Limit:  50     Upper Rate Limit 100 PAV 180     Sensed AV Delay:  160  Tachy Zones VF:  200     VT:  160     Next Remote Date:  08/03/2010     Next Cardiology Appt Due:  01/05/2011 Tech Comments:  lead alert value reprogrammed.  Device function normal.  Merlin transmissions every 3 months.  ROV 6/12 wih Dr. Ladona Ridgel. Altha Harm, LPN MD Comments:  Agree with above.  Impression & Recommendations:  Problem # 1:  AUTOMATIC IMPLANTABLE CARDIAC DEFIBRILLATOR SITU (ICD-V45.02) His device is working normally.  Will recheck in several months.  Problem # 2:  CHRONIC SYSTOLIC HEART FAILURE (ICD-428.22) He remains class 1-2.  Continue meds as below. A low sodium diet is recommended. His updated medication list for this problem includes:    Plavix 75 Mg Tabs (Clopidogrel bisulfate) .Marland Kitchen... 1 by mouth once daily    Toprol Xl 100 Mg Tb24 (Metoprolol succinate) .Marland Kitchen... Take 1 tablet by mouth once a day    Altace 10 Mg Caps (Ramipril) .Marland Kitchen... 1 cap by mouth once daily    Adult Aspirin Ec Low Strength 81 Mg Tbec (Aspirin) .Marland Kitchen... 1 by mouth once daily  Problem # 3:  ISCHEMIC CARDIOMYOPATHY (ICD-414.8) He denies anginal symptoms.  Continue meds as below.  He remains quite active. His updated medication list for this problem includes:    Plavix 75 Mg Tabs (Clopidogrel bisulfate) .Marland Kitchen... 1 by mouth once daily    Toprol Xl 100 Mg Tb24 (Metoprolol succinate) .Marland Kitchen... Take 1 tablet by mouth once a day    Altace 10 Mg Caps (Ramipril) .Marland Kitchen... 1 cap by mouth once daily    Adult Aspirin Ec Low Strength 81 Mg Tbec (Aspirin) .Marland Kitchen... 1 by mouth once daily  Patient Instructions: 1)  Your physician recommends that you schedule a follow-up appointment in: 1 year. 2)  Your physician recommends that you continue on your  current medications as directed. Please refer to the Current Medication list given to you today.

## 2010-09-07 NOTE — Miscellaneous (Signed)
Summary: Orders Update  Clinical Lists Changes       Medication Administration  Injection # 1:    Medication: Vit B12 1000 mcg    Diagnosis: 285.9    Route: SQ    Site: L deltoid    Exp Date: 09/2011    Lot #: [id number]    Mfr: American Regent    Comments: CHARGED [id number] AND [id number]    Given by: Drucie Opitz IN ALLERGY LAB

## 2010-09-07 NOTE — Procedures (Signed)
Summary: wound check   Current Medications (verified): 1)  Adult Aspirin Ec Low Strength 81 Mg  Tbec (Aspirin) .Marland Kitchen.. 1 By Mouth Once Daily 2)  Plavix 75 Mg  Tabs (Clopidogrel Bisulfate) .Marland Kitchen.. 1 By Mouth Once Daily 3)  Toprol Xl 100 Mg  Tb24 (Metoprolol Succinate) .... Take 1 Tablet By Mouth Once A Day 4)  Altace 10 Mg  Caps (Ramipril) .Marland Kitchen.. 1 Cap By Mouth Once Daily 5)  Simvastatin 80 Mg  Tabs (Simvastatin) .... Take One Tablet By Mouth At Bedtime 6)  Gemfibrozil 600 Mg  Tabs (Gemfibrozil) .Marland Kitchen.. 1 Tab By Mouth Once Daily 7)  Fish Oil 1000 Mg  Caps (Omega-3 Fatty Acids) .Marland Kitchen.. 1 Cap By Mouth Two Times A Day 8)  Therapeutic Multivitamin   Tabs (Multiple Vitamin) .Marland Kitchen.. 1 By Mouth Once Daily 9)  Cyanocobalamin 1000 Mcg/ml Inj Soln (Cyanocobalamin) .Marland Kitchen.. 1 Ml Injected Monthly  Allergies (verified): No Known Drug Allergies   ICD Specifications Following MD:  Lewayne Bunting, MD     ICD Vendor:  [employer] Jude     ICD Model Number:  [phone]     ICD Serial Number:  [id number] ICD DOI:  01/12/2010     ICD Implanting MD:  Lewayne Bunting, MD  Lead 1:    Location: RA     DOI: 12/13/2003     Model #: [id number]     Serial #: [id number]     Status: active Lead 2:    Location: RV     DOI: 12/13/2003     Model #: 1580     Serial #: [id number]     Status: active  Indications::  NICM  Explantation Comments: 01/12/10 St[employer] Jude Atlas [id number] explanted.  ICD Follow Up Remote Check?  No Battery Est. Longevity:  7.8 years Underlying rhythm:  dependent ICD Dependent:  Yes       ICD Device Measurements Atrium:  Amplitude: 1.4 mV, Impedance: 400 ohms, Threshold: 0.75 V at 0.5 msec Right Ventricle:  Amplitude: 8.3 mV, Impedance: 350 ohms, Threshold: 0.625 V at 0.5 msec Shock Impedance: 39 ohms   Episodes MS Episodes:  0     Percent Mode Switch:  0     Coumadin:  No Shock:  0     ATP:  0     Nonsustained:  0     Atrial Pacing:  41%     Ventricular Pacing:  97%  Brady Parameters Mode DDD     Lower Rate Limit:  50     Upper Rate Limit  100 PAV 180     Sensed AV Delay:  160  Tachy Zones VF:  200     VT:  160     Next Cardiology Appt Due:  05/02/2010 Tech Comments:  Ventricular autocapture on.  Steri strips removed.  No redness or edema noted.  Activity restrictions reviewed with the patient.  ROV 9/11 with Dr. Ladona Ridgel. Altha Harm, LPN  January 23, 2010 2:52 PM

## 2010-09-07 NOTE — Assessment & Plan Note (Signed)
Summary: b-12/apc  Nurse Visit   Allergies: No Known Drug Allergies  Medication Administration  Injection # 1:    Medication: Xolair (omalizumab) 150mg     Diagnosis: UNSPECIFIED ANEMIA (ICD-285.9)    Route: IM    Site: R deltoid    Exp Date: 02/03/2013    Lot #: [id number]    Mfr: Salome Spotted    Comments: vitamin b-12 1000 micrograms pt didn't wait.    Given by: Tammy Scott, allergy tech  Orders Added: 1)  Xolair (omalizumab) 150mg  [J2357] 2)  Admin of Therapeutic Inj  intramuscular or subcutaneous [45409]

## 2010-09-07 NOTE — Assessment & Plan Note (Signed)
Summary: per check out/sf   Visit Type:  Follow-up Primary Provider:  Dr.Scott Nadel  CC:  CAD.  History of Present Illness: The patient is seen for followup of coronary artery disease.  I saw him last October, 2010.  He has not had chest pain.  Since that time he had an upgrade of his ICD.  He has not had any discharges.  Is not having any chest pain or shortness of breath.  There's been no syncope or presyncope.  Current Medications (verified): 1)  Plavix 75 Mg  Tabs (Clopidogrel Bisulfate) .Marland Kitchen.. 1 By Mouth Once Daily 2)  Toprol Xl 100 Mg  Tb24 (Metoprolol Succinate) .... Take 1 Tablet By Mouth Once A Day 3)  Altace 10 Mg  Caps (Ramipril) .Marland Kitchen.. 1 Cap By Mouth Once Daily 4)  Adult Aspirin Ec Low Strength 81 Mg  Tbec (Aspirin) .Marland Kitchen.. 1 By Mouth Once Daily 5)  Therapeutic Multivitamin   Tabs (Multiple Vitamin) .Marland Kitchen.. 1 By Mouth Once Daily 6)  Cyanocobalamin 1000 Mcg/ml Inj Soln (Cyanocobalamin) .Marland Kitchen.. 1 Ml Injected Monthly 7)  Gemfibrozil 600 Mg  Tabs (Gemfibrozil) .Marland Kitchen.. 1 Tab By Mouth Once Daily 8)  Simvastatin 80 Mg  Tabs (Simvastatin) .... Take One Tablet By Mouth At Bedtime 9)  Fish Oil 1000 Mg  Caps (Omega-3 Fatty Acids) .Marland Kitchen.. 1 Cap By Mouth Two Times A Day  Allergies (verified): No Known Drug Allergies  Past History:  Past Medical History: HYPERTENSION (ICD-401.9) CAD (ICD-414.00..DES 2005 .Marland KitchenLAD / DES..2008  .Marland KitchenMarland KitchenLAD (new) LBBB ISCHEMIC CARDIOMYOPATHY (ICD-414.8) EF 30%..echo..12/2003  /  30% cath...2008 Mitral regurgitation...mild  echo..12/2003 ICD...2005 ..for syncope and arrythmia.Marland Kitchen / feels better with rate response mode off /  generator changed 6/11 SYNCOPE (ICD-780.2)...2005.. received ICD AV BLOCK, COMPLETE (ICD-426.0) HYPERCHOLESTEROLEMIA (ICD-272.0) GERD (ICD-530.81) DEGENERATIVE JOINT DISEASE (ICD-715.90) VITAMIN B12 DEFICIENCY (ICD-266.2) murmur    October, 2011  Review of Systems       Patient denies fever, chills, headache, sweats, rash, change in vision, change in  hearing, chest pain, cough, nausea vomiting, urinary symptoms.  All of the systems are reviewed and are negative.  Vital Signs:  Patient profile:   78 year old male Height:      71 inches Weight:      170 pounds BMI:     23.80 Pulse rate:   50 / minute BP sitting:   130 / 62  (left arm) Cuff size:   regular  Vitals Entered By: Hardin Negus, RMA (May 10, 2010 3:48 PM)  Physical Exam  General:  patient looks great. Head:  head is atraumatic. Eyes:  no xanthelasma. Neck:  no jugular venous distention. Chest Wall:  no chest wall tenderness. Lungs:  lungs are clear.  Respiratory effort is not labored. Heart:  cardiac exam reveals S1-S2.  There is a 3/6 systolic murmur.  This seems increased from the past. Abdomen:  abdomen is soft. Msk:  no musculoskeletal deformities. Extremities:  no peripheral edema. Skin:  no skin rashes. Psych:  patient is oriented to person time and place.  Affect is normal.    ICD Specifications Following MD:  Lewayne Bunting, MD     ICD Vendor:  [employer] Jude     ICD Model Number:  [id number]     ICD Serial Number:  [id number] ICD DOI:  01/12/2010     ICD Implanting MD:  Lewayne Bunting, MD  Lead 1:    Location: RA     DOI: 12/13/2003     Model #: [id number]  Serial #: [id number]     Status: active Lead 2:    Location: RV     DOI: 12/13/2003     Model #: 1580     Serial #: [id number]     Status: active  Indications::  NICM  Explantation Comments: 01/12/10 [employer] Jude Atlas [id number] explanted.  ICD Follow Up ICD Dependent:  Yes      Episodes Coumadin:  No  Brady Parameters Mode DDD     Lower Rate Limit:  50     Upper Rate Limit 100 PAV 180     Sensed AV Delay:  160  Tachy Zones VF:  200     VT:  160     Impression & Recommendations:  Problem # 1:  MURMUR (ICD-785.2)  His updated medication list for this problem includes:    Toprol Xl 100 Mg Tb24 (Metoprolol succinate) .Marland Kitchen... Take 1 tablet by mouth once a day    Altace 10 Mg Caps (Ramipril) .Marland Kitchen... 1 cap by mouth  once daily The patient has a murmur that is louder than I remember from the past.  We will proceed with two-dimensional echo to be sure that he is not developing significant aortic stenosis.  I will be in touch with him with the information.  Orders: Echocardiogram (Echo)  Problem # 2:  CHRONIC SYSTOLIC HEART FAILURE (ICD-428.22)  His updated medication list for this problem includes:    Plavix 75 Mg Tabs (Clopidogrel bisulfate) .Marland Kitchen... 1 by mouth once daily    Toprol Xl 100 Mg Tb24 (Metoprolol succinate) .Marland Kitchen... Take 1 tablet by mouth once a day    Altace 10 Mg Caps (Ramipril) .Marland Kitchen... 1 cap by mouth once daily    Adult Aspirin Ec Low Strength 81 Mg Tbec (Aspirin) .Marland Kitchen... 1 by mouth once daily CHF is well controlled.  No change in therapy.  Problem # 3:  AUTOMATIC IMPLANTABLE CARDIAC DEFIBRILLATOR SITU (ICD-V45.02) His  new ICD is in place and working well.  Problem # 4:  HYPERTENSION (ICD-401.9)  His updated medication list for this problem includes:    Toprol Xl 100 Mg Tb24 (Metoprolol succinate) .Marland Kitchen... Take 1 tablet by mouth once a day    Altace 10 Mg Caps (Ramipril) .Marland Kitchen... 1 cap by mouth once daily    Adult Aspirin Ec Low Strength 81 Mg Tbec (Aspirin) .Marland Kitchen... 1 by mouth once daily Blood pressure is well-controlled.  No change in therapy.  Problem # 5:  CAD (ICD-414.00)  His updated medication list for this problem includes:    Plavix 75 Mg Tabs (Clopidogrel bisulfate) .Marland Kitchen... 1 by mouth once daily    Toprol Xl 100 Mg Tb24 (Metoprolol succinate) .Marland Kitchen... Take 1 tablet by mouth once a day    Altace 10 Mg Caps (Ramipril) .Marland Kitchen... 1 cap by mouth once daily    Adult Aspirin Ec Low Strength 81 Mg Tbec (Aspirin) .Marland Kitchen... 1 by mouth once daily  Orders: EKG w/ Interpretation (93000) Coronary disease is stable.  EKG is done today and reviewed by me.  He has dual-chamber pacing.  No further workup.  Patient Instructions: 1)  Your physician has requested that you have an echocardiogram.  Echocardiography is  a painless test that uses sound waves to create images of your heart. It provides your doctor with information about the size and shape of your heart and how well your heart's chambers and valves are working.  This procedure takes approximately one hour. There are no restrictions for this procedure. 2)  Your physician  wants you to follow-up in:  1 year.  You will receive a reminder letter in the mail two months in advance. If you don't receive a letter, please call our office to schedule the follow-up appointment.

## 2010-09-07 NOTE — Cardiovascular Report (Signed)
Summary: Office Visit Remote   Office Visit Remote   Imported By: Roderic Ovens 08/24/2009 11:59:52  _____________________________________________________________________  External Attachment:    Type:   Image     Comment:   External Document

## 2010-09-07 NOTE — Assessment & Plan Note (Signed)
Summary: B12 ///KP  Nurse Visit   Allergies: No Known Drug Allergies  Medication Administration  Injection # 1:    Medication: Vit B12 1000 mcg    Diagnosis: UNSPECIFIED ANEMIA (ICD-285.9)    Route: IM    Site: L deltoid    Exp Date: 12/2011    Lot #: [id number]    Mfr: American Regent    Comments: 1000 MCG CHARGED J3420 , [id number]    Patient tolerated injection without complications    Given by: SUSANNE FORD IN ALLERGY LAB   Medication Administration  Injection # 1:    Medication: Vit B12 1000 mcg    Diagnosis: UNSPECIFIED ANEMIA (ICD-285.9)    Route: IM    Site: L deltoid    Exp Date: 12/2011    Lot #: [id number]    Mfr: American Regent    Comments: 1000 MCG CHARGED J3420 , [id number]    Patient tolerated injection without complications    Given by: SUSANNE FORD IN ALLERGY LAB

## 2010-09-07 NOTE — Assessment & Plan Note (Signed)
Summary: b-12/apc  Nurse Visit   Allergies: No Known Drug Allergies  Medication Administration  Injection # 1:    Medication: Vit B12 1000 mcg    Diagnosis: UNSPECIFIED ANEMIA (ICD-285.9)    Route: SQ    Site: L deltoid    Exp Date: 04/09/2011    Lot #: [id number]    Mfr: American Regent    Comments:    Patient tolerated injection without complications    Given by: tammy Elexis Pollak in allergy lab  Orders Added: 1)  Admin of Therapeutic Inj  intramuscular or subcutaneous [96372] 2)  Vit B12 1000 mcg [J3420]   Medication Administration  Injection # 1:    Medication: Vit B12 1000 mcg    Diagnosis: UNSPECIFIED ANEMIA (ICD-285.9)    Route: SQ    Site: L deltoid    Exp Date: 04/09/2011    Lot #: [id number]    Mfr: American Regent    Comments:    Patient tolerated injection without complications    Given by: tammy Arek Spadafore in allergy lab  Orders Added: 1)  Admin of Therapeutic Inj  intramuscular or subcutaneous [96372] 2)  Vit B12 1000 mcg [J3420]

## 2010-09-07 NOTE — Progress Notes (Signed)
Summary: test results  Phone Note Call from Patient Call back at Home Phone [phone]   Caller: Patient Reason for Call: Lab or Test Results Initial call taken by: Judie Grieve,  May 31, 2010 3:28 PM  Follow-up for Phone Call        Left message to call back Meredith Staggers, RN  May 31, 2010 6:21 PM   Left message to call back Meredith Staggers, RN  June 01, 2010 9:45 AM   pt returning call-pt requesting call back next week after 315p at [phone] Glynda Jaeger  June 01, 2010 3:34 PM   Additional Follow-up for Phone Call Additional follow up Details #1::        spoke w/pt regarding echo results Meredith Staggers, RN  June 02, 2010 4:13 PM

## 2010-09-13 NOTE — Assessment & Plan Note (Signed)
Summary: 6 months/apc- FASTING LABS PER PT//KP   Primary Care Provider:  Dr.Dub Maclellan  CC:  6 month ROV & review of mult medical problems....  History of Present Illness: 77 y/o WM here for a follow up visit & review of mult medical problems... 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   ~  August 30, 2009:  he continues to do well- no new complaints or concerns... BP controlled on meds; he saw DrKatz for f/u 10/10- doing well, no CP, mild MR, ICD in place, no change in Rx... Chol controlled on Simva + Gemfib + Fish Oil; still getting B12 shots monthly; has Flu shot 10/10 & due for PNEUMOVAX now.   ~  March 08, 2010:  feels well- no new complaints or concerns... he had ICD generator changed by DrTaylor 6/11- doing well post op... as noted he has CAD, ischemic cardiomyopathy, CHB w/ pacer/ ICD, he has class I-II symptoms and still works daily at [employer]... we discussed recheck FLP & we cut his Simva80 to 1/2 tab Qhs.   ~  September 05, 2010:  another good 10mo- now working three 12H shifts per week & denies CP, palpit, SOB, etc... he tells me he also walks regularly & notes that he's good for about 1/2 mile then his jaw starts to hurt & he has to stop... he saw DrTaylor 9/11- OK w/ new generator for his AICD;  he saw [facility] 10/11 & f/u 2DEcho showed mild LVH, decr LVF w/ EF= 35-40% & mild HK, gr1DD, mild AS & MR, mild LAdil ==> no change in meds... BP controlled, Chol looks good on 1/2 tab, he still gets monthly B12 shots...    Current Problem List:  HYPERTENSION (ICD-401.9) - controlled on TOPROL XL 100mg /d & ALTACE 10mg /d... takes meds regularly and tolerates well... BP 118/62 here, and OK at home per pt... he gets rare HA which he believes is related to his BP... denies fatigue, visual changes, CP, palipit, dizziness, syncope, dyspnea, edema, etc...  CAD (ICD-414.00) - on ASA 81mg /d & PLAVIX 75mg /d. ISCHEMIC  CARDIOMYOPATHY (ICD-414.8) - he's had prev MI w/ mult caths/ interventions. CHRONIC SYSTOLIC HEART FAILURE (ICD-428.22) AORTIC STENOSIS (ICD-424.1) MITRAL REGURGITATION (ICD-396.3)  ~  last cath 5/08 showed norm Lmain, patent stent in prox LAD, 80-90% lesion in mid-LAD w/ PCI & stent placed, & 50% ostial stenosis of 1st diagonal branch of LAD; 40%mid & 70%distal CIRC; 50%prox 40%mid 40%distal RCA; Global HK w/EF=30%.  ~  NuclearStressTest 4/09 showed infer scar, no ischemia, EF=40%.  ~  2DEcho 10/11 showed mild LVH, decr LVF w/ EF= 35-40% & mild HK, gr1DD, mild AS & MR, mild LAdil.  Hx of SYNCOPE (ICD-780.2) AV BLOCK, COMPLETE (ICD-426.0) AUTOMATIC IMPLANTABLE CARDIAC DEFIBRILLATOR SITU (ICD-V45.02)  ~  Pacer/ ICD per DrTaylor w/ several adjustments in 2008...  ~  5/11:  f/u DrTaylor w/ decision for elective generator change> done 6/11 & tol well.  HYPERCHOLESTEROLEMIA (ICD-272.0) - takes SIMVASTATIN 80mg - 1/2 daily, GEMFIBRIZOL 600mg /d, & FISH OIL Bid...  ~  FLP 08/14/07 on Simva40 showed TChol 163, TG 94, HDL 34, LDL 111... rec incr Simvastatin to 80mg /d.  ~  FLP 02/24/08 on Simva80 showed TChol 136, TG 92, HDL 38, LDL 80... continue same.  ~  FLP 1/10 showed TChol 125, TG 67, HDL 32, LDL 80... rec- continue same.  ~  FLP 7/10 showed TChol 124, TG 86, HDL 36, LDL  71  ~  FLP 1/11 on Simva80+Gemfib600 showed TChol 137, TG 99, HDL 39, LDL 78  ~  FLP 8/11 on Simva80+Gemfib600 showed TChol 134, TG 82, HDL 36, LDL 82... Simva decr to 40.  ~  FLP 1/12 on Simva40+Gemfib600 showed TChol 157, TG 139, HDL 38, LDL 92  GERD (ICD-530.81) - he had ERCP in 8/06 w/ norm ducts, +gallstones, s/p lap chole 2006... he uses OTC meds Prn...  ~  he notes that he had a colonoscopy in the past from DrPatterson, & he reports that it was neg.  DEGENERATIVE JOINT DISEASE (ICD-715.90) - he tried Mobic but it bothered his stomach, therefore uses Tylenol Prn.  VITAMIN B12 DEFICIENCY (ICD-266.2) - old chart not available-  ? details of this Dx... on B12 shots xyrs.  ~  labs 1/09 showed Hg=12.9  ~  labs 1/10 showed Hg= 12.7  ~  labs 1/11 showed Hg= 12.6  ~  labs 1/12 showed Hg= 13.2  Health Maintenance:  ~  GI:    ~  GU:    ~  Immunizations:  he gets the yearly Flu vaccine;  [place], he says;  given Pneumovax 1/11 age 83.   Current Medications (verified): 1)  Plavix 75 Mg  Tabs (Clopidogrel Bisulfate) .Marland Kitchen.. 1 By Mouth Once Daily 2)  Toprol Xl 100 Mg  Tb24 (Metoprolol Succinate) .... Take 1 Tablet By Mouth Once A Day 3)  Altace 10 Mg  Caps (Ramipril) .Marland Kitchen.. 1 Cap By Mouth Once Daily 4)  Adult Aspirin Ec Low Strength 81 Mg  Tbec (Aspirin) .Marland Kitchen.. 1 By Mouth Once Daily 5)  Therapeutic Multivitamin   Tabs (Multiple Vitamin) .Marland Kitchen.. 1 By Mouth Once Daily 6)  Cyanocobalamin 1000 Mcg/ml Inj Soln (Cyanocobalamin) .Marland Kitchen.. 1 Ml Injected Monthly 7)  Gemfibrozil 600 Mg  Tabs (Gemfibrozil) .Marland Kitchen.. 1 Tab By Mouth Once Daily 8)  Simvastatin 80 Mg  Tabs (Simvastatin) .... Take One Tablet By Mouth At Bedtime (Currently Taking 1/2 Tab Once Daily ) 9)  Fish Oil 1000 Mg  Caps (Omega-3 Fatty Acids) .Marland Kitchen.. 1 Cap By Mouth Two Times A Day  Allergies (verified): No Known Drug Allergies  Past History:  Past Medical History: HYPERTENSION (ICD-401.9) CAD (ICD-414.00..DES 2005 .Marland KitchenLAD / DES..2008  .Marland KitchenMarland KitchenLAD (new) LBBB ISCHEMIC CARDIOMYOPATHY (ICD-414.8) EF 30%..echo..12/2003  /  30% cath...2008 Mitral regurgitation...mild  echo..12/2003 ICD...2005 ..for syncope and arrythmia.Marland Kitchen / feels better with rate response mode off /  generator changed 6/11 SYNCOPE (ICD-780.2)...2005.. received ICD AV BLOCK, COMPLETE (ICD-426.0) murmur    October, 2011  Current Problems:  HYPERTENSION (ICD-401.9) CORONARY ARTERY DISEASE (ICD-414.00) ISCHEMIC CARDIOMYOPATHY (ICD-414.8) CHRONIC SYSTOLIC HEART FAILURE (ICD-428.22) AORTIC STENOSIS (ICD-424.1) MITRAL REGURGITATION (ICD-396.3) Hx of SYNCOPE (ICD-780.2) AV BLOCK, COMPLETE  (ICD-426.0) AUTOMATIC IMPLANTABLE CARDIAC DEFIBRILLATOR SITU (ICD-V45.02) HYPERCHOLESTEROLEMIA (ICD-272.0) GERD (ICD-530.81) DEGENERATIVE JOINT DISEASE (ICD-715.90) UNSPECIFIED ANEMIA (ICD-285.9) VITAMIN B12 DEFICIENCY (ICD-266.2)  Past Surgical History: S/P ICD Implanted S/P laparoscopic cholecystectomy 9/06 by Freddi Che  Family History: Reviewed history from 03/08/2010 and no changes required. Mother died age 80 w/ ? ischemic bowel. Father died age 15 w/ heart problems. 6 siblings: 4 Brothers: one died age 75 w/ MI; one had CABG; one had lung cancer surg. 2 Sisters: one w/ pacer  Social History: Reviewed history from 03/08/2010 and no changes required. 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  The patient denies anorexia, fever, weight loss, weight gain, vision loss, decreased hearing, hoarseness, chest pain, syncope, peripheral edema, prolonged cough, headaches, hemoptysis, abdominal pain, melena, hematochezia, severe indigestion/heartburn, hematuria, incontinence, muscle weakness, suspicious skin lesions, transient blindness, difficulty walking, depression, unusual weight change, abnormal bleeding, enlarged lymph nodes, and angioedema.    Vital Signs:  Patient profile:   77 year old male Height:      71 inches Weight:      171.25 pounds BMI:     23.97 O2 Sat:      98 % on Room air Temp:     97 degrees F oral Pulse rate:   50 / minute BP sitting:   118 / 62  (right arm) Cuff size:   regular  Vitals Entered By: Carver Fila (September 05, 2010 2:08 PM)  O2 Flow:  Room air  Physical Exam  Additional Exam:  WD, WN, 77 y/o WM in NAD... wt=171# ht=6'1" BMI=22... GENERAL:  Alert & oriented; pleasant & cooperative... HEENT:  [place]/AT, EOM-wnl, PERRLA, EACs-clear, TMs-wnl, NOSE-clear, THROAT-clear & wnl. NECK:  Supple w/ fairROM; no JVD; normal carotid  impulses w/o bruits; no thyromegaly or nodules palpated; no lymphadenopathy. CHEST:  Clear to P & A; without wheezes/ rales/ or rhonchi heard... HEART:  Pacer in L upper chest area ~ nontender, RR, gr 1/6 SEM without rubs or gallops detected... ABDOMEN:  Soft & nontender; normal bowel sounds; no organomegaly or masses palpated... EXT: without deformities, mild arthritic changes; no varicose veins/ venous insuffic/ or edema. NEURO:  CN's intact; no focal neuro deficits apprec... DERM:  No lesions noted; no rash etc...    Impression & Recommendations:  Problem # 1:  HYPERTENSION (ICD-401.9) BP controlled>  same meds. His updated medication list for this problem includes:    Toprol Xl 100 Mg Tb24 (Metoprolol succinate) .Marland Kitchen... Take 1 tablet by mouth once a day    Altace 10 Mg Caps (Ramipril) .Marland Kitchen... 1 cap by mouth once daily  Problem # 2:  CORONARY ARTERY DISEASE (ICD-414.00) Followed by Delton See for Cards>  continue his meds. His updated medication list for this problem includes:    Adult Aspirin Ec Low Strength 81 Mg Tbec (Aspirin) .Marland Kitchen... 1 by mouth once daily    Plavix 75 Mg Tabs (Clopidogrel bisulfate) .Marland Kitchen... 1 by mouth once daily    Toprol Xl 100 Mg Tb24 (Metoprolol succinate) .Marland Kitchen... Take 1 tablet by mouth once a day    Altace 10 Mg Caps (Ramipril) .Marland Kitchen... 1 cap by mouth once daily  Problem # 3:  AORTIC STENOSIS (ICD-424.1) Recent 2DEcho showed mild AS>  aware, followed by Cards. His updated medication list for this problem includes:    Adult Aspirin Ec Low Strength 81 Mg Tbec (Aspirin) .Marland Kitchen... 1 by mouth once daily    Plavix 75 Mg Tabs (Clopidogrel bisulfate) .Marland Kitchen... 1 by mouth once daily    Toprol Xl 100 Mg Tb24 (Metoprolol succinate) .Marland Kitchen... Take 1 tablet by mouth once a day  Problem # 4:  AUTOMATIC IMPLANTABLE CARDIAC DEFIBRILLATOR SITU (ICD-V45.02) He has heart block, hx syncope, AICD per DrTaylor...  Problem # 5:  HYPERCHOLESTEROLEMIA (ICD-272.0) Table on the Simva40 + Gemfib 1/d... His  updated medication list for this problem includes:    Simvastatin 80 Mg Tabs (Simvastatin) .Marland Kitchen... Take 1/2 tab by mouth once daily...    Gemfibrozil 600 Mg Tabs (Gemfibrozil) .Marland Kitchen... 1 tab by mouth once daily  Problem # 6:  GERD (ICD-530.81) GI per DrPatterson... he  uses OTC meds Prn.  Problem # 7:  DEGENERATIVE JOINT DISEASE (ICD-715.90) He works almost full time at his age & w/ his underlying heart dis... just uses Tylenol Prn. His updated medication list for this problem includes:    Adult Aspirin Ec Low Strength 81 Mg Tbec (Aspirin) .Marland Kitchen... 1 by mouth once daily  Problem # 8:  OTHER MEDICAL PROBLEMS AS NOTED>>> He hjas been on B12 shots for yrs...  Complete Medication List: 1)  Adult Aspirin Ec Low Strength 81 Mg Tbec (Aspirin) .Marland Kitchen.. 1 by mouth once daily 2)  Plavix 75 Mg Tabs (Clopidogrel bisulfate) .Marland Kitchen.. 1 by mouth once daily 3)  Toprol Xl 100 Mg Tb24 (Metoprolol succinate) .... Take 1 tablet by mouth once a day 4)  Altace 10 Mg Caps (Ramipril) .Marland Kitchen.. 1 cap by mouth once daily 5)  Simvastatin 80 Mg Tabs (Simvastatin) .... Take 1/2 tab by mouth once daily.Marland KitchenMarland Kitchen 6)  Gemfibrozil 600 Mg Tabs (Gemfibrozil) .Marland Kitchen.. 1 tab by mouth once daily 7)  Fish Oil 1000 Mg Caps (Omega-3 fatty acids) .Marland Kitchen.. 1 cap by mouth two times a day 8)  Therapeutic Multivitamin Tabs (Multiple vitamin) .Marland Kitchen.. 1 by mouth once daily 9)  Cyanocobalamin 1000 Mcg/ml Inj Soln (Cyanocobalamin) .Marland Kitchen.. 1 ml injected monthly  Other Orders: Vit B12 1000 mcg (J3420) Admin of Therapeutic Inj  intramuscular or subcutaneous (82956)  Patient Instructions: 1)  Today we updated your med list- see below.... 2)  Continue your current meds the same... 3)  We gave you a copy of your recent lab work... 4)  We gave you a B12 shot today... 5)  DON'T LET THEM OVERWORK YOU!!! 6)  Call for any problems.Marland KitchenMarland Kitchen 7)  Please schedule a follow-up appointment in 6 months. Prescriptions: FISH OIL 1000 MG  CAPS (OMEGA-3 FATTY ACIDS) 1 cap by mouth two times a day   #180 x 4   Entered and Authorized by:   Michele Mcalpine MD   Signed by:   Michele Mcalpine MD on 09/05/2010   Method used:   Print then Give to Patient   RxID:   [id number] GEMFIBROZIL 600 MG  TABS (GEMFIBROZIL) 1 tab by mouth once daily  #90 x 4   Entered and Authorized by:   Michele Mcalpine MD   Signed by:   Michele Mcalpine MD on 09/05/2010   Method used:   Print then Give to Patient   RxID:   [id number] SIMVASTATIN 80 MG  TABS (SIMVASTATIN) take 1/2 tab by mouth once daily...  #90 x 4   Entered and Authorized by:   Michele Mcalpine MD   Signed by:   Michele Mcalpine MD on 09/05/2010   Method used:   Print then Give to Patient   RxID:   [id number] ALTACE 10 MG  CAPS (RAMIPRIL) 1 cap by mouth once daily  #90 x 4   Entered and Authorized by:   Michele Mcalpine MD   Signed by:   Michele Mcalpine MD on 09/05/2010   Method used:   Print then Give to Patient   RxID:   [id number] TOPROL XL 100 MG  TB24 (METOPROLOL SUCCINATE) Take 1 tablet by mouth once a day  #90 x 4   Entered and Authorized by:   Michele Mcalpine MD   Signed by:   Michele Mcalpine MD on 09/05/2010   Method used:   Print then Give to Patient   RxID:   [id number] PLAVIX 75 MG  TABS (  CLOPIDOGREL BISULFATE) 1 by mouth once daily  #90 x 4   Entered and Authorized by:   Michele Mcalpine MD   Signed by:   Michele Mcalpine MD on 09/05/2010   Method used:   Print then Give to Patient   RxID:   [id number]      Medication Administration  Injection # 1:    Medication: Vit B12 1000 mcg    Diagnosis: UNSPECIFIED ANEMIA (ICD-285.9)    Route: IM    Site: L deltoid    Exp Date: 07/13    Lot #: [id number]    Mfr: [employer]    Patient tolerated injection without complications    Given by: Carver Fila (September 05, 2010 3:28 PM)  Orders Added: 1)  Est. Patient Level IV [62130] 2)  Vit B12 1000 mcg [J3420] 3)  Admin of Therapeutic Inj  intramuscular or subcutaneous [86578]

## 2010-09-18 ENCOUNTER — Encounter (INDEPENDENT_AMBULATORY_CARE_PROVIDER_SITE_OTHER): Payer: BC Managed Care – PPO | Admitting: Internal Medicine

## 2010-09-18 ENCOUNTER — Encounter: Payer: Self-pay | Admitting: Internal Medicine

## 2010-09-18 DIAGNOSIS — I5022 Chronic systolic (congestive) heart failure: Secondary | ICD-10-CM

## 2010-09-18 DIAGNOSIS — I10 Essential (primary) hypertension: Secondary | ICD-10-CM

## 2010-09-18 DIAGNOSIS — I428 Other cardiomyopathies: Secondary | ICD-10-CM

## 2010-09-27 NOTE — Assessment & Plan Note (Signed)
Summary: sjm/icd/jml   Visit Type:  ICD-[employer] Primary Provider:  Dr.Scott Nadel  CC:  no complaints.  History of Present Illness: [name] returns today for followup. He is a pleasant 77 yo man with [name], CHF, LBBB, and syncope. He is s/p BiV ICD. He denies c/p or sob. His CHF symptoms are class 2. He has not had any intercurrent ICD therapies.  Current Medications (verified): 1)  Adult Aspirin Ec Low Strength 81 Mg  Tbec (Aspirin) .Marland Kitchen.. 1 By Mouth Once Daily 2)  Plavix 75 Mg  Tabs (Clopidogrel Bisulfate) .Marland Kitchen.. 1 By Mouth Once Daily 3)  Toprol Xl 100 Mg  Tb24 (Metoprolol Succinate) .... Take 1 Tablet By Mouth Once A Day 4)  Altace 10 Mg  Caps (Ramipril) .Marland Kitchen.. 1 Cap By Mouth Once Daily 5)  Simvastatin 80 Mg  Tabs (Simvastatin) .... Take 1/2 Tab By Mouth Once Daily.Marland KitchenMarland Kitchen 6)  Gemfibrozil 600 Mg  Tabs (Gemfibrozil) .Marland Kitchen.. 1 Tab By Mouth Once Daily 7)  Fish Oil 1000 Mg  Caps (Omega-3 Fatty Acids) .Marland Kitchen.. 1 Cap By Mouth Two Times A Day 8)  Therapeutic Multivitamin   Tabs (Multiple Vitamin) .Marland Kitchen.. 1 By Mouth Once Daily 9)  Cyanocobalamin 1000 Mcg/ml Inj Soln (Cyanocobalamin) .Marland Kitchen.. 1 Ml Injected Monthly  Allergies (verified): No Known Drug Allergies  Past History:  Past Medical History: Last updated: 09/05/2010 HYPERTENSION (ICD-401.9) CAD (ICD-414.00..DES 2005 .Marland KitchenLAD / DES..2008  .Marland KitchenMarland KitchenLAD (new) LBBB ISCHEMIC CARDIOMYOPATHY (ICD-414.8) EF 30%..echo..12/2003  /  30% cath...2008 Mitral regurgitation...mild  echo..12/2003 ICD...2005 ..for syncope and arrythmia.Marland Kitchen / feels better with rate response mode off /  generator changed 6/11 SYNCOPE (ICD-780.2)...2005.. received ICD AV BLOCK, COMPLETE (ICD-426.0) murmur    October, 2011  Current Problems:  HYPERTENSION (ICD-401.9) CORONARY ARTERY DISEASE (ICD-414.00) ISCHEMIC CARDIOMYOPATHY (ICD-414.8) CHRONIC SYSTOLIC HEART FAILURE (ICD-428.22) AORTIC STENOSIS (ICD-424.1) MITRAL REGURGITATION (ICD-396.3) Hx of SYNCOPE (ICD-780.2) AV BLOCK, COMPLETE  (ICD-426.0) AUTOMATIC IMPLANTABLE CARDIAC DEFIBRILLATOR SITU (ICD-V45.02) HYPERCHOLESTEROLEMIA (ICD-272.0) GERD (ICD-530.81) DEGENERATIVE JOINT DISEASE (ICD-715.90) UNSPECIFIED ANEMIA (ICD-285.9) VITAMIN B12 DEFICIENCY (ICD-266.2)  Past Surgical History: Last updated: 09/05/2010 S/P ICD Implanted S/P laparoscopic cholecystectomy 9/06 by [facility]  Review of Systems  The patient denies chest pain, syncope, dyspnea on exertion, and peripheral edema.    Vital Signs:  Patient profile:   77 year old male Height:      71 inches Weight:      171.50 pounds BMI:     24.01 Pulse rate:   54 / minute BP sitting:   124 / 64  (left arm) Cuff size:   regular  Vitals Entered By: Caralee Ates CMA (September 18, 2010 4:26 PM)  Physical Exam  General:  Well developed, well nourished, in no acute distress. Head:  head is atraumatic. Eyes:  no xanthelasma. Mouth:  Teeth, gums and palate normal. Oral mucosa normal. Neck:  no jugular venous distention. Chest Wall:  Well healed ICD incision. Lungs:  lungs are clear.  Respiratory effort is not labored. Heart:  cardiac exam reveals S1-S2.  There is a 2/6 systolic murmur.   Abdomen:  abdomen is soft. Msk:  no musculoskeletal deformities. Pulses:  pulses normal in all 4 extremities Extremities:  no peripheral edema. Neurologic:  Alert and oriented x 3.    ICD Specifications Following MD:  Lewayne Bunting, MD     ICD Vendor:  [facility] Jude     ICD Model Number:  [id number]     ICD Serial Number:  [id number] ICD DOI:  01/12/2010     ICD Implanting  MD:  Lewayne Bunting, MD  Lead 1:    Location: RA     DOI: 12/13/2003     Model #: [id number]     Serial #: [id number]     Status: active Lead 2:    Location: RV     DOI: 12/13/2003     Model #: [id number]     Serial #: [id number]     Status: active  Indications::  NICM  Explantation Comments: 01/12/10 [employer] Jude Atlas [id number] explanted.  ICD Follow Up ICD Dependent:  Yes      Episodes Coumadin:  No  Brady Parameters Mode DDD      Lower Rate Limit:  50     Upper Rate Limit 100 PAV 180     Sensed AV Delay:  160  Tachy Zones VF:  200     VT:  160     MD Comments:  Normal device function.  Impression & Recommendations:  Problem # 1:  CHRONIC SYSTOLIC HEART FAILURE (ICD-428.22) His symptoms are well controlled. He will continue his current meds. His updated medication list for this problem includes:    Adult Aspirin Ec Low Strength 81 Mg Tbec (Aspirin) .Marland Kitchen... 1 by mouth once daily    Plavix 75 Mg Tabs (Clopidogrel bisulfate) .Marland Kitchen... 1 by mouth once daily    Toprol Xl 100 Mg Tb24 (Metoprolol succinate) .Marland Kitchen... Take 1 tablet by mouth once a day    Altace 10 Mg Caps (Ramipril) .Marland Kitchen... 1 cap by mouth once daily  Problem # 2:  AUTOMATIC IMPLANTABLE CARDIAC DEFIBRILLATOR SITU (ICD-V45.02) His device is working normally. Will recheck in several months.  Problem # 3:  HYPERCHOLESTEROLEMIA (ICD-272.0) He will continue his current meds. A low fat diet is requested. His updated medication list for this problem includes:    Simvastatin 80 Mg Tabs (Simvastatin) .Marland Kitchen... Take 1/2 tab by mouth once daily...    Gemfibrozil 600 Mg Tabs (Gemfibrozil) .Marland Kitchen... 1 tab by mouth once daily

## 2010-10-02 ENCOUNTER — Encounter: Payer: Self-pay | Admitting: Pulmonary Disease

## 2010-10-02 ENCOUNTER — Ambulatory Visit (INDEPENDENT_AMBULATORY_CARE_PROVIDER_SITE_OTHER): Payer: BC Managed Care – PPO

## 2010-10-02 DIAGNOSIS — D649 Anemia, unspecified: Secondary | ICD-10-CM

## 2010-10-03 NOTE — Cardiovascular Report (Signed)
Summary: Office Visit   Office Visit   Imported By: Roderic Ovens 09/27/2010 15:45:54  _____________________________________________________________________  External Attachment:    Type:   Image     Comment:   External Document

## 2010-10-12 NOTE — Assessment & Plan Note (Signed)
Summary: b12  Nurse Visit   Allergies: No Known Drug Allergies  Medication Administration  Injection # 1:    Medication: Vit B12 1000 mcg    Diagnosis: UNSPECIFIED ANEMIA (ICD-285.9)    Route: IM    Site: L deltoid    Exp Date: 02/2012    Lot #: [id number]    Mfr: [employer]    Comments: 1000MCG/CB    Given by: Drucie Opitz IN [facility]ERGY LAB  Orders Added: 1)  Vit B12 1000 mcg [J3420] 2)  Admin of Injection (IM/SQ) [11914]   Medication Administration  Injection # 1:    Medication: Vit B12 1000 mcg    Diagnosis: UNSPECIFIED ANEMIA (ICD-285.9)    Route: IM    Site: L deltoid    Exp Date: 02/2012    Lot #: [id number]    Mfr: [employer]    Comments: 1000MCG/CB    Given by: Drucie Opitz IN ALLERGY LAB  Orders Added: 1)  Vit B12 1000 mcg [J3420] 2)  Admin of Injection (IM/SQ) [13086]

## 2010-10-25 ENCOUNTER — Telehealth: Payer: Self-pay | Admitting: Pulmonary Disease

## 2010-10-25 NOTE — Telephone Encounter (Signed)
Called and spoke with [facility]. Rep states unable to read rx for simvastatin so I verified that this should be taken 1/2 at hs.  She verbalized understanding and nothing further needed.

## 2010-10-26 ENCOUNTER — Telehealth: Payer: Self-pay | Admitting: Pulmonary Disease

## 2010-10-26 NOTE — Telephone Encounter (Signed)
From phone note 10/25/10 looks like this rx was already taken care of. Verlon Au phone [facility] and advised them of the simvastatin rx.  lmomtcb x1 to inform pt rx has already been taken care of  Carver Fila, [place]

## 2010-10-27 NOTE — Telephone Encounter (Signed)
Called and informed [name] rx was already taken care of. She verbalized udnerstanding Carver Fila, [place]

## 2010-10-30 ENCOUNTER — Ambulatory Visit (INDEPENDENT_AMBULATORY_CARE_PROVIDER_SITE_OTHER): Payer: BC Managed Care – PPO

## 2010-10-30 ENCOUNTER — Other Ambulatory Visit: Payer: Self-pay | Admitting: *Deleted

## 2010-10-30 DIAGNOSIS — D649 Anemia, unspecified: Secondary | ICD-10-CM

## 2010-10-30 MED ORDER — SIMVASTATIN 80 MG PO TABS: 80.0000 mg | ORAL_TABLET | Freq: Every evening | ORAL | Status: DC

## 2010-10-31 MED ORDER — CYANOCOBALAMIN 1000 MCG/ML IJ SOLN
1000.0000 ug | Freq: Once | INTRAMUSCULAR | Status: AC
  Administered 2010-10-30: 1000 ug via INTRAMUSCULAR

## 2010-11-09 ENCOUNTER — Telehealth: Payer: Self-pay | Admitting: Pulmonary Disease

## 2010-11-09 NOTE — Telephone Encounter (Signed)
LMOMTCB for [employer]

## 2010-11-09 NOTE — Telephone Encounter (Signed)
Error.[name] ° ° °

## 2010-11-13 NOTE — Telephone Encounter (Signed)
lmomtcb x2 for sharon

## 2010-11-14 NOTE — Telephone Encounter (Signed)
lmomtcb x3 advised her if she needed [place] for anything else to call us back and we will be happy to help. Will sign off message

## 2010-11-27 ENCOUNTER — Ambulatory Visit (INDEPENDENT_AMBULATORY_CARE_PROVIDER_SITE_OTHER): Payer: BC Managed Care – PPO

## 2010-11-27 DIAGNOSIS — D649 Anemia, unspecified: Secondary | ICD-10-CM

## 2010-11-27 MED ORDER — CYANOCOBALAMIN 1000 MCG/ML IJ SOLN
1000.0000 ug | Freq: Once | INTRAMUSCULAR | Status: AC
  Administered 2010-11-27: 1000 ug via INTRAMUSCULAR

## 2010-12-19 NOTE — Assessment & Plan Note (Signed)
[facility]                         ELECTROPHYSIOLOGY OFFICE NOTE   NAME:[name], [name]                    MRN:          [id number]  DATE:12/13/2006                            DOB:          April 28, 1932    [name] returns today for follow-up.  He is a very pleasant elderly  male with a history of an ischemic cardiomyopathy, syncope, complete  heart block, and left bundle branch block, who is status post ICD  insertion in the past.  He returns today for follow-up and overall feels  well.  The patient has been [place] hunting on a regular basis and notes  that when he does climb up a very steep hill, he gets short of breath  and has to stop and rest.  Otherwise, he has felt well.  He denies chest  pain.  He denies shortness of breath.   PHYSICAL EXAMINATION:  GENERAL:  He is a pleasant, well-appearing 77-  year-old man in no acute distress.  VITAL SIGNS:  The blood pressure today is 120/70, the pulse was 60 and  regular, respirations were 18.  The weight was 172 pounds.  NECK:  No jugular venous distention.  LUNGS:  Clear bilaterally to auscultation.  No wheezes, rales or rhonchi  were present.  CARDIOVASCULAR:  A regular rate and rhythm with normal S1 and S2.  EXTREMITIES:  No edema.   His EKG demonstrates sinus rhythm with complete heart block and  ventricular pacing.   Interrogation of his defibrillator demonstrates a St. Jude Epic with P  waves of greater than 3.  There were no R waves secondary to his  complete heart block.  The impedance was 425 in the atrium and 410 in  the right ventricle.  The threshold is 0.75 at 0.5 in both the right  atrium and the right ventricle.  The battery voltage is 2.85 V.  The  patient's additional data demonstrated that his AV conduction was very  poor and that he did not conduct at a rate of 45 beats per minute.  To  this end, we reprogrammed his device today to the DDDR mode and turned  his AV delay  from 300 down to 200.  We increased his lower rate from 50  to 60.  Hopefully, he will be very tolerant of this despite the  ventricular pacing.   IMPRESSION:  1. Ischemic cardiomyopathy.  2. Syncope.  3. Complete heart block.  4. Status post dual-chamber implantable cardioverter-defibrillator      implantation.   DISCUSSION:  Overall, [name] is stable.  We will plan to see him  back in the office in several months for additional follow-up.     Doylene Canning. Ladona Ridgel, MD  Electronically Signed   GWT/MedQ  DD: 12/13/2006  DT: 12/14/2006  Job #: [id number]

## 2010-12-19 NOTE — Assessment & Plan Note (Signed)
[facility]                            CARDIOLOGY OFFICE NOTE   NAME:Marcou, LORIK GUO                    MRN:          [id number]  DATE:12/03/2007                            DOB:          09-30-1931    [name] is seen for cardiology follow-up.  I saw him last on November 17, 2007.  He had been having some chest tightness, and we decided to  proceed with a Myoview scan.  He has an old significant inferior  infarct.  There was no change from 2007.  There was no ischemia.  The  ejection fraction was 40%.   I have reviewed his current situation with him.  He has noticed now that  he only has this tightness if he has an upper respiratory type of  infection, and he is ambulating well.   PAST MEDICAL HISTORY:   ALLERGIES:  HE DOES NOT TOLERATE CHOLESTEROL MEDICINES WELL, BUT WE DO  HAVE HIM ON SIMVASTATIN.   MEDICATIONS:  1. Plavix.  2. Altace.  3. Gemfibrozil.  4. Aspirin.  5. Fish oil  6. Toprol.  7. Simvastatin.  8. Meloxicam.   OTHER MEDICAL PROBLEMS:  See the complete list on my note of November 17, 2007.   REVIEW OF SYSTEMS:  He is doing well today.   PHYSICAL EXAMINATION:  Weight is 169 pounds.  Blood pressure is 102/64  with a pulse of 55.  The patient is oriented to person, time and place.  Affect is normal.  He is here with a family member.  I noticed that he  has a tooth missing in the upper right side of his the jaw.  This is not  the tooth that he knocked out in the past.  In fact, he had a problem  with this tooth recently.  HEENT:  Reveals no xanthelasma.  He has normal extraocular motion.  There are no carotid bruits.  There is no jugular venous distention.  LUNGS:  Are clear.  Respiratory effort is not labored.  CARDIAC EXAM:  Reveals a S1-S2.  There are no clicks or significant  murmurs.  The abdomen is soft.  He has no peripheral edema.   PROBLEMS:  Are listed on the note of November 17, 2007.  He is stable.  I  feel  that he does not need any further workup at this point.  I will see  him back in 6 months.  If he has any difficulties, he will be in touch.     Luis Abed, MD, [facility]  Electronically Signed    JDK/MedQ  DD: 12/03/2007  DT: 12/03/2007  Job #: [id number]

## 2010-12-19 NOTE — Assessment & Plan Note (Signed)
[facility]                            CARDIOLOGY OFFICE NOTE   NAME:Deland, MAHLIK LENN                    MRN:          [id number]  DATE:01/08/2007                            DOB:          1932-02-25    [name] is seen post-hospitalization.  He is doing very well.  He  had been seen on Dec 13, 2006, by Dr. Ladona Ridgel.  Some adjustments were made  in his ICD pacemaker.  After he left the office that day, he did not  feel particularly well and ultimately came to the hospital on Dec 14, 2006.  He noticed return of the burning sensation in his upper chest and  neck with exertion.  It seemed that he was possibly having a new rate  response mode and that increased heart rate with quiet underlying  ischemia might be the issue and, now that his heart rate was faster, he  was having true ischemic symptoms.  He was admitted and he was  catheterized.  He did have an LAD lesion that was treated with a Taxus  stent by Dr. Juanda Chance.  He felt better, but he did not feel perfectly  well, and Dr. Ladona Ridgel decided to turn his rate response back off.  Since  doing that, he has been at home and he is doing well and feeling  extremely well.  He has not had any chest burning.  He is going about  full activities.   Today, in the office, he feels well and has no significant complaints.   PAST MEDICAL HISTORY:   ALLERGIES:  His CHOLESTEROL MEDS, he has done poorly, although he  tolerates Vytorin.   MEDICATIONS:  1. Plavix 75.  2. Toprol 50.  3. Altace 10.  4. Multivitamin.  5. Aspirin 81.  6. Vytorin 10/40.  7. Gemfibrozil 600.  8. Fish oil.   OTHER MEDICAL PROBLEMS:  See the list below.   REVIEW OF SYSTEMS:  He is feeling well and his review of systems is  negative.   PHYSICAL EXAM:  Blood pressure is 122/80 with a pulse of 50.  The  patient is oriented to person, time and place.  Affect is normal.  Lungs  are clear.  Respiratory effort is not labored.  HEENT  reveals no  xanthelasma.  He has no carotid bruits, no jugular venous distention.  Cardiac exam reveals an S1 with an S2.  There are no clicks or  significant murmurs.  His abdomen is soft.  He has no peripheral edema.   EKG reveals that his rhythm is paced.   PROBLEMS INCLUDE:  1. History of left bundle branch block.  2. Taxus stent in 2005 and repeat Taxus stent in May of 2008.  3. History of syncope with arrhythmias in 2005 and an ICD is put in      place.  4. GERD.  5. History of a rectal fissure in the past.  6. History of intolerance to some statins, but he does well with      Vytorin.  7. Status post cholecystectomy, 2006.  8. Feeling poorly when his ICD  pacer is turned to the rate response      mode and this has been turned back off.   He is stable.  He is taking his medications and I will see him back in  three months.     Luis Abed, MD, [facility]  Electronically Signed    JDK/MedQ  DD: 01/08/2007  DT: 01/08/2007  Job #: [id number]   cc:   Lonzo Cloud. Kriste Basque, MD

## 2010-12-19 NOTE — Assessment & Plan Note (Signed)
[facility]                            CARDIOLOGY OFFICE NOTE   NAME:Regina, MAKIH STEFANKO                    MRN:          [id number]  DATE:12/05/2007                            DOB:          1931-09-14    [name] returns today for followup.  He is a very pleasant 77-year-  old man with the history of ventricular tachycardia  and congestive  heart failure, who is status post dual-chamber ICD implantation.  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  He returns  today for followup.  He denies chest pain.  He has had no intercurrent  ICD therapies.  He continues working.  He denies peripheral edema or  shortness of breath.   MEDICATIONS:  1. Include Plavix 75 a day.  2. Altace 10 a day.  3. Multiple vitamins.  4. Gemfibrozil 600 daily.  5. Aspirin 81 mg daily.  6. Toprol 100 mg daily.  7. Simvastatin 80 a day.  8. Meloxicam p.r.n.   PHYSICAL EXAM:  He is a pleasant, well-appearing, 77 year old man, in no  distress.  Blood pressure 136/70, the pulse was 50 and regular, respirations were  18, the weight was 170 pounds.  NECK:  Revealed no JVD.  LUNGS:  Clear bilaterally to auscultation.  No wheezes, rales or rhonchi  are present.  CARDIOVASCULAR EXAM:  Revealed a regular rate and rhythm with normal S1  and S2.  There are no murmurs, rubs or gallops present.  EXTREMITIES:  Demonstrated no cyanosis, clubbing or edema.   Interrogation of his defibrillator demonstrates a Licensed conveyancer with T-  waves greater than 3.  There are no R-waves, secondary to complete heart  block.  The impedance was 47 in the A, 395 in the V.  Thresholds volt at  0.5 in the A and in the V.  The battery voltage was 2.6 volts.  His  lower rate was set at 50.  He was 39% A-paced and 99% V-paced.   IMPRESSION:  1. Ischemic cardiomyopathy.  2. Ventricular tachycardia.  3. Complete heart block.  4. Status post dual-chamber ICD insertion.   DISCUSSION:  Mr. Spilde is stable and his defibrillator is working  normally.  He is starting to see some signs of age on the battery.  We  will plan to see the patient back in the office in one year for ICD  followup.     Doylene Canning. Ladona Ridgel, MD  Electronically Signed    GWT/MedQ  DD: 12/05/2007  DT: 12/05/2007  Job #: [id number]

## 2010-12-19 NOTE — Assessment & Plan Note (Signed)
[facility]                            CARDIOLOGY OFFICE NOTE   NAME:Lodato, RYLEE NUZUM                    MRN:          [id number]  DATE:04/09/2007                            DOB:          October 16, 1931    Mr. Hain is doing well.  He had an intervention to his LAD in May,  2008.  After this, Dr. Ladona Ridgel readjusted his ICD and turned his rate  response off.  He has been doing well and feeling well and going about  full activities.  He has no chest pain.  He has had no syncope or  presyncope, and he has no shortness of breath.   PAST MEDICAL HISTORY:  See the list below.   ALLERGIES:  In the past, he has not tolerated cholesterol medicines but  now he is on simvastatin and gemfibrozil, and he is stable and doing  well with these.  He does not tolerate VYTORIN.   MEDICATIONS:  1. Plavix 75.  2. Toprol 50.  3. Altace 10.  4. Multivitamin.  5. B12 injections monthly.  6. Gemfibrozil.  7. Aspirin 81.  8. Fish oil.  9. Simvastatin 40.   REVIEW OF SYSTEMS:  His review of systems is negative.  He feels great  and has no problems.   PHYSICAL EXAMINATION:  Weight 171 pounds.  Blood pressure 124/74 with a  pulse of 58.  The patient is oriented to person, time, and place.  Affect is normal.  HEENT:  No xanthelasma.  He has normal extraocular motion.  NECK:  There are no carotid bruits.  There is no jugular venous  distention.  LUNGS:  Clear.  Respiratory effort is not labored.  CARDIAC:  An S1 with an S2.  There are no clicks or significant murmurs.  ABDOMEN:  Soft.  He has normal bowel sounds.  EXTREMITIES:  He has no peripheral edema.   No labs are done today.   PROBLEMS:  1. History of left bundle branch block.  2. Taxus stent in 2005 and repeat Taxus stent in May, 2008.  3. History of syncope with arrhythmia in 2005 and an ICD is in place.  4. Feeling poorly when his ICD pacer was turned to the rate-response      mode, and this has been  turned back off, and he is doing well.  5. Gastroesophageal reflux disease.  6. History of a rectal fissure in the past.  7. Intolerance to VYTORIN but he is not tolerating simvastatin.  8. Status post cholecystectomy in 2006.   Mr. Karapetyan is doing well.  I will see him back in six months.     Luis Abed, MD, [facility]  Electronically Signed    JDK/MedQ  DD: 04/09/2007  DT: 04/09/2007  Job #: [id number]

## 2010-12-19 NOTE — Cardiovascular Report (Signed)
NAMEROYLEE, CHAFFIN             ACCOUNT NO.:  [id number]   MEDICAL RECORD NO.:  [id number]          PATIENT TYPE:  INP   LOCATION:  2028                         FACILITY:  [facility]   PHYSICIAN:  Bruce R. Juanda Chance, MD, FACCDATE OF BIRTH:  [birth year]   DATE OF PROCEDURE:  12/16/2006  DATE OF DISCHARGE:                            CARDIAC CATHETERIZATION   CLINICAL HISTORY:  Mr. Trammel is 77 years old, has an ischemic  cardiomyopathy and an ICD in place.  He has had previous stenting in the  proximal LAD by Dr. Riley Kill with a 2.75 x 32 mm Taxus stent.  Recently  he had his pacemaker/defibrillator adjusted and he developed exertional  chest pain.  In retrospect, this was present before but appeared to be  somewhat worse.  He was admitted the hospital with a diagnosis of  crescendo angina and scheduled for angiography today.   The procedure was performed today.  We performed a diagnostic study  earlier today, which showed a tight lesion just distal to the stent in  the LAD estimated at 80%.  We were interrupted due to an acute  myocardial infarction requiring emergency intervention on another  patient and had to postpone his intervention until later today.   PROCEDURE:  The procedure was followed via the right femoral artery.  We  exchanged the old sheath for a new 6-French sheath.  The patient was  given Angiomax bolus and infusion and been previously loaded with  clopidogrel and aspirin.  We used a Q-4 6-French guiding catheter with  side holes and passed a Prowater wire down the LAD across the lesion  without difficulty.  We direct-stented with a 2.75 x 12 mm Taxus stent,  deploying this in the mid LAD and overlapping the distal portion of the  previous stent.  We deployed this with one inflation of 12 atmospheres  for 30 seconds.  We then postdilated with a 3.0 x 20-mm Quantum  Maverick, performing two inflations up to 18 atmospheres for 30 seconds  both within the new stent and also  within the old stent.  We kept the  postdilatation in the old stent distal to the diagonal branch.  The  patient developed slow flow (TIMI-2 flow) following the postdilatation  and we gave him verapamil, which restored the flow to normal or near-  normal.  The patient tolerated the procedure well and left the  laboratory in satisfactory condition.   RESULTS:  Initially the stenosis in the mid LAD distal to the old stent  was estimated at 80%.  Following stenting this improved to 0%.   CONCLUSION:  Successful PCI of the peri-stent restenotic lesion in the  mid LAD using a Taxus drug-eluting stent, with improvement of sentinel  narrowing from 90% to 0%.   DISPOSITION:  The patient was returned to the [facility] unit for  further observation.      Bruce Elvera Lennox Juanda Chance, MD, [facility]  Electronically Signed     BRB/MEDQ  D:  12/16/2006  T:  12/17/2006  Job:  [id number]   cc:   Luis Abed, MD, [facility]  Doylene Canning. Ladona Ridgel, MD  Cardiopulmonary Lab

## 2010-12-19 NOTE — Discharge Summary (Signed)
[name], [name]             ACCOUNT NO.:  [id number]   MEDICAL RECORD NO.:  [id number]          PATIENT TYPE:  INP   LOCATION:  6524                         FACILITY:  [facility]   PHYSICIAN:  Madolyn Frieze. Jens Som, MD, FACCDATE OF BIRTH:  [birth year]   DATE OF ADMISSION:  12/14/2006  DATE OF DISCHARGE:  12/17/2006                               DISCHARGE SUMMARY   DISCHARGE DIAGNOSES:  1. Unstable angina.  2. Progressive coronary artery disease.  3. Status post drug-eluting stent to the left anterior descending.  4. Hypokalemia.  5. Elevated heart rate response to pacing at which time Dr. Ladona Ridgel had      his rate response turned off.   HISTORY:  As noted below.   PROCEDURES PERFORMED:  Cardiac catheterization with drug-eluting stent  to the LAD on Dec 16, 2006 by Dr. Charlies Constable.   SUMMARY OF HISTORY:  [name] is a 77 year old white male who  presented with 5/10 substernal chest pressure, tightness and burning  with radiation to the jaw associated with shortness of breath, lasting  approximately 5 minutes, resolving with rest.  He first noted this  yesterday after being evaluated by Dr. Ladona Ridgel in the EP clinic at which  time his device was reprogrammed to DDR and the AV delay was decreased  from 300 to 200 milliseconds and the rate increased from 50 to 60.  While at work on the morning of admission he had recurring exertional  chest discomfort associated with shortness of breath, which he states  was the worst it has been over the preceding 24 hours.  He returned home  and a family member spoke with Dr. Myrtis Ser who recommended presenting to  the emergency room for further evaluation.   PAST MEDICAL HISTORY:  Notable for:  1. Myocardial infarction in 1989.  Last catheterization in 2005 showed      50% proximal LAD, distal 80-90% LAD, normal circumflex, 20-30% mid      RCA, 30-40% distal RCA.  Taxus stenting of the LAD was performed.      Last echocardiogram showed an EF of  25-35%.  2. Chronic systolic congestive heart failure.  Status post St. Jude      Epic DR Plus ICD.  3. Hypertension.  4. Hyperlipidemia.  5. GERD.  6. Status post cholecystectomy.  7. Rectal fissures.  8. Chronic left bundle branch block.  9. Recent history of two tick bites.   Chest x-ray on May 10 did not show any acute abnormality.  Admission  weight was 75.7 kg.  Discharge weight was 75 kg.  Admission H&H 12.7 and  36.6, normal indices, platelets 226, WBCs 4.8.  Prior to discharge H&H  was 12.5 and 36.0, normal indices, platelets 209, WBCs 5.1.  Admission  PTT was 30, PT 12.5, sodium 139, potassium 3.7, BUN 12, creatinine 0.9,  glucose slightly elevated at 101.  LFTs were within normal limits except  for a total bilirubin which was slightly elevated at 1.3.  Prior to  discharge sodium was 139, potassium 4.0, BUN 11, creatinine 0.95,  glucose 101.  CK, MBs, relative indexes and troponins were  within normal  limits x4.  TSH was 4.570.  Urinalysis was unremarkable.   HOSPITAL COURSE:  [name] was admitted to [facility] by Ward Givens and Dr. Jerral Bonito.  He was placed on his home medications as well  as IV heparin.  Overnight he did not have any further chest discomfort.  He had ruled out for myocardial infarction.  His potassium at 3.3 was  supplemented.  Dr. Myrtis Ser noted that the patient may have received insulin  on the morning of May 11 in error.  Nursing staff was checking into this  and addressing all issues.  With his chest discomfort it was felt that  he should undergo cardiac catheterization.  Sugars were followed after  the insulin shot.  The patient did not have any difficulty.  Catheterization performed on May 12 by Dr. Juanda Chance showed an EF of 30%.  LAD stent was patent.  He had a mid 90% LAD lesion, 50% diagonal-1  lesion, 40% mid circumflex, 70% distal circumflex, 50/40% proximal RCA,  40% distal RCA.   [name] underwent PCI to the LAD, reducing this to  0% utilizing a  Taxus drug-eluting stent.  Post sheath removal and bedrest, the patient  was ambulating without difficulty.  By May 13 the patient felt better;  however, he was starting to have some more upper chest and neck burning  discomfort.  Dr. Ladona Ridgel saw the patient and evaluated the patient's  recent records.  He stated that his rate response was turned on and he  had begun to experience this chest discomfort.  Dr. Ladona Ridgel had the rate  response turned off.  These changes were made by the rep.  The patient  was ambulating without difficulty.  Catheterization site was intact,  thus it was felt that he could be discharged home.   DISPOSITION:  The patient is discharged home.  His activities and wound  care are per supplemental discharge sheet.  He is advised he may return  to work on Dec 23, 2006.  He is asked to maintain a heart healthy diet.   NEW MEDICATIONS:  1. Imdur 30 mg daily.  2. Nitroglycerin p.r.n.   He is asked to continue his other medications which include:  1. Plavix 75 mg daily.  2. Toprol XL 100 mg daily.  3. Altace 10 mg daily.  4. Aspirin 81 daily.  5. Multivitamin daily.  6. Lopid 600 mg daily.  7. Vytorin 10/40 daily.  8. B12. fish oil and Benefiber as previously.   He was asked to bring all medicines to all appointments.  He will see  Dr. Myrtis Ser in the office on January 08, 2007 at 12 o'clock.   DISCHARGE TIME:  Forty-five minutes.      Joellyn Rued, PA-C      Madolyn Frieze Jens Som, MD, [facility]  Electronically Signed    EW/MEDQ  D:  12/17/2006  T:  12/17/2006  Job:  [id number]   cc:   Luis Abed, MD, [facility]  Doylene Canning. Ladona Ridgel, MD  Lonzo Cloud. Kriste Basque, MD

## 2010-12-19 NOTE — Assessment & Plan Note (Signed)
[facility]                            CARDIOLOGY OFFICE NOTE   NAME:Stawicki, MONTY SPICHER                    MRN:          [id number]  DATE:05/17/2008                            DOB:          01-25-1932    Mr. Vallez looks great.  He is here with his daughter today.  He is  here for followup of his coronary artery disease and he has ischemic  cardiomyopathy and history of syncope.  He has not had any chest pain or  shortness of breath.  He has had no syncope or presyncope.  He has had  no ICD discharges.  He is doing well.   PAST MEDICAL HISTORY:   ALLERGIES:  Some type of CHOLESTEROL MEDS.   MEDICATIONS:  See the flow sheet in the chart.   REVIEW OF SYSTEMS:  He is not having any GI or GU symptoms.  He has not  had any fevers or headaches.  His review of systems is negative.   PHYSICAL EXAMINATION:  VITAL SIGNS:  Blood pressure is 140/66.  Pulse is  50.  GENERAL:  The patient is oriented to person, time and place.  Affect is  normal.  HEENT:  No xanthelasma.  He has normal extraocular motion.  NECK:  There are no carotid bruits.  There is no jugular venous  distention.  LUNGS:  Clear.  Respiratory effort is not labored.  CARDIAC:  An S1 with an S2.  There are no clicks or significant murmurs.  ABDOMEN:  Soft.  EXTREMITIES:  He has no peripheral edema.   EKG today is paced.   PROBLEMS:  1. History of left bundle branch block.  2. Taxus stent in 2005 and repeat Taxus stent in 2008.  3. History of syncope with arrhythmia in 2005 and the placement of an      ICD.  4. The patient felt poorly when his ICD was turned to a rate response      mode and this was turned back off and he has been fine ever since.  5. GERD.  6. History of rectal fissure.  7. Intolerance to Vytorin, but he tolerates simvastatin.  8. Status post cholecystectomy.   Mr. Rietz is doing very well.  No med changes.  No testing is needed.     Luis Abed, MD,  [facility]  Electronically Signed    JDK/MedQ  DD: 05/17/2008  DT: 05/18/2008  Job #: [id number]

## 2010-12-19 NOTE — H&P (Signed)
NAMEMarland Kitchen  JEZIEL, HOFFMANN NO.:  [id number]   MEDICAL RECORD NO.:  [id number]          PATIENT TYPE:  INP   LOCATION:  2028                         FACILITY:  [facility]   PHYSICIAN:  Luis Abed, MD, FACCDATE OF BIRTH:  16-Jun-1932   DATE OF ADMISSION:  12/14/2006  DATE OF DISCHARGE:                              HISTORY & PHYSICAL   PRIMARY CARDIOLOGIST:  Dr. [name].   ELECTROPHYSIOLOGIST:  Dr. Gilman Schmidt.   PATIENT PROFILE:  This 77 year old Caucasian male with prior history of  CAD, ischemic cardiomyopathy and chronic systolic congestive heart  failure who presents to the ED with a 24-hour history of exertional  chest pressure relieved with rest.   PROBLEM LIST:  1. Unstable angina/coronary artery disease.      a.     Status post myocardial infarction in 1989.      b.     Dec 13, 2003, cardiac catheterization:  Left main normal,       left anterior descending 50% proximal, 80-90% distal, circumflex       normal, right coronary artery 20-30% mid, 30-40% distal, posterior       descending artery normal.      c.     On Dec 16, 2003, percutaneous coronary intervention and       stenting of the left anterior descending with a 2.75 x 32 mm Taxus       drug-eluting stent.  2. Ischemic cardiomyopathy.      a.     Dec 09, 2003, 2-D echocardiogram:  Ejection fraction 25-35%       by echo.      b.     Dec 13, 2003, status post St. Jude Epic DR Plus (Model V-239)       automatic implantable cardioverter/defibrillator.  3. Chronic systolic congestive heart failure.  4. Hypertension.  5. Hyperlipidemia.  6. Gastroesophageal reflux disease.  7. Status post cholecystectomy.  8. History of rectal fissures.  9. Left bundle branch block.  10.History of two tick bites in the past couple of weeks.   HISTORY OF PRESENT ILLNESS:  This 77 year old Caucasian male with  history of CAD, ischemic cardiomyopathy and chronic systolic congestive  heart failure was in his usual  state of health until yesterday when he  began to experience exertional 5/10 substernal chest pressure, tightness  and burning with radiation to the jaw associated with shortness of  breath lasting 3-5 minutes and resolving with rest.  He first noted this  shortly after being seen by Dr. Ladona Ridgel in EP Clinic at which time his  device was reprogrammed to DDDR and the AV delay was decreased from 300  to 200 msec with the lower rate increased from 50 to 60 beats per  minute.  This morning, he was at work and had recurrent exertional chest  pressure associated with shortness of breath which he said was the worst  that it has been over the past 24 hours.  After returning home he had a  family member call in and after discussion with me I advised that they  present to the  [facility] ED for evaluation.  Here in the ED he is no  acute distress and appears well however reports just a mild tightness  that he describes as indigestion.  His ECG shows no acute changes and he  is being admitted for further evaluation rule out.   ALLERGIES:  HE HAS INTOLERANCE TO MULTIPLE STATINS BUT IS TOLERATING  VYTORIN JUST FINE.  OTHERWISE, HE HAS NO KNOWN DRUG ALLERGIES.   HOME MEDICATIONS:  1. Plavix 75 mg daily.  2. Toprol XL 100 mg daily.  3. Altace 10 mg daily.  4. Aspirin 81 mg daily.  5. Multivitamin one daily.  6. Gemfibrozil 600 mg daily.  7. Vytorin 10/40 mg daily.  8. B12 injections monthly.   FAMILY HISTORY:  Mother died of cancer at age 30.  She did have a stroke  in her lifetime.  Father died of an MI at age 82.  He had four brothers  and two sisters.  There is a history of CAD and MI in his brothers.   SOCIAL HISTORY:  He lives in [place] by himself.  He continues to  work in maintenance.  He has about a three pack-year history of tobacco  abuse quitting at age 51.  He denies any alcohol or drugs.  He is very  active at work, saying that he is on his feet and walking for 8 hours a  day but  otherwise does not exercise.   REVIEW OF SYSTEMS:  Positive for vision loss and wearing glasses.  Also  for hearing loss.  Positive for chest pain and dyspnea on exertion as  outlined in the HPI.  He has chronic constipation.  He denies any PND,  orthopnea, dizziness, syncope, edema or early satiety.  All other  systems reviewed and negative.   PHYSICAL EXAMINATION:  VITAL SIGNS:  Temperature 97.8, heart rate 59,  respirations 26, blood pressure 133/77, pulse ox 97% on 2 liters.  GENERAL:  Pleasant white male in no acute distress.  Awake, alert,  oriented x3.  HEENT:  Normal.  NECK:  Normal carotid upstrokes.  No bruits or JVD.  No adenopathy or  thyromegaly.  LUNGS:  Respirations were even and unlabored.  CARDIAC:  Regular S1-S2.  No S3, S4 or murmurs.  ABDOMEN:  Round, soft, nontender, nondistended.  Bowel sounds present  x4.  EXTREMITIES:  Warm, dry, pink.  No clubbing, cyanosis or edema.  Dorsalis pedis and posterior tibial pulses 2+ and equal bilaterally.  He  has no femoral bruits.   ACCESSORY CLINICAL FINDINGS:  Chest x-ray is pending.  His EKG shows a  paced rhythm with no obvious acute ST or T changes.  All his lab work is  pending.   ASSESSMENT AND PLAN:  1. Unstable angina/coronary artery disease.  The patient reports a 24-      hour history of exertional chest discomfort relieved with rest.  He      attributes this to changes made to his pacemaker although we have      explained that this is unlikely the case.  We will plan to admit      and rule out.  We will add heparin and nitro and otherwise continue      his aspirin, Plavix, beta blocker, angiotensin-converting enzyme      inhibitor and statin.  We will plan for left heart cardiac      catheterization on Monday to reevaluate coronary anatomy which was      last evaluated  in 2005.  We will also have his device interrogated      whereas his device was reprogrammed to DDDR.  We would be     interested in seeing if  perhaps he is tracking a tachycardiac      rhythm which could potentially contribute to his symptoms.  2. Hypertension, this is stable.  Continue beta blocker, angiotensin-      converting enzyme inhibitor therapy.  3. Hyperlipidemia.  We will check lipids and LFTs.  Continue Vytorin      therapy.  He has intolerances to multiple statins however has      tolerated Vytorin.  4. Ischemic cardiomyopathy/chronic systolic congestive heart failure.      Ejection fraction 25-35%.  He is status post implantable      cardioverter/defibrillator placement.  He appears euvolemic on exam      today and otherwise has good functional capacity.  Continue beta      blocker and angiotensin-converting enzyme inhibitor therapy.  5. Gastroesophageal reflux disease.  He does report some indigestion.      It is not clear if this is angina or not.  We will add proton pump      inhibitor.  6. History of recent tick bites.  He denies any fever, chills, or      malaise.  He says he does not think that these were deer ticks as      they were fairly large.  He thinks he got them off in a timely      fashion.      Nicolasa Ducking, ANP      Luis Abed, MD, [facility]  Electronically Signed    CB/MEDQ  D:  12/14/2006  T:  12/15/2006  Job:  [id number]

## 2010-12-19 NOTE — Cardiovascular Report (Signed)
NAMEESTEFAN, Blevins             ACCOUNT NO.:  [id number]   MEDICAL RECORD NO.:  [id number]          PATIENT TYPE:  INP   LOCATION:  2028                         FACILITY:  [facility]   PHYSICIAN:  Bruce R. Juanda Chance, MD, FACCDATE OF BIRTH:  [birth year]   DATE OF PROCEDURE:  12/16/2006  DATE OF DISCHARGE:                            CARDIAC CATHETERIZATION   CLINICAL HISTORY:  Mr. Dunavant is 77 years old and has known coronary  artery disease and has an ischemic cardiomyopathy and is status post  implantation of a DDD ICD. He has had a previous long drug-eluting stent  placed by Dr. Riley Kill in the LAD.  Last week, he had his ICD  interrogated and adjusted and after this he noticed increased chest pain  with exertion.  In retrospect, he had some of his prior to the  adjustment of his ICD.  For this reason, he is brought in for evaluation  with catheterization.  His past history is also significant for  hyperlipidemia, congestive heart failure and hypertension.   PROCEDURE:  The procedure was performed via the right femoral artery  using arterial sheath and 6-French preformed coronary catheters.  A  front wall arterial puncture was performed and Omnipaque contrast was  used.  After completion of the diagnostic study, we plan to do  intervention on the lesion in the LAD.  However due to an emergency we  had to stop the procedure after the diagnostic study with plans to do  the intervention later, hopefully today.   RESULTS:  The left main coronary artery was free of significant disease.   The left anterior descending artery gave rise to a large diagonal branch  and a septal perforator.  There was 0% stenosis at the stent site in the  proximal LAD.  Just after the stent in the mid LAD there was a 90% focal  stenosis. There was 50% ostial stenosis in the moderately large diagonal  branch.   The circumflex artery gave rise to a ramus branch, a small marginal  branch and a posterolateral  branch.  There was 40% mid stenosis in the  circumflex artery and 70% stenosis in the mid-to-distal portion of the  circumflex artery.   The right coronary artery was a moderate-sized vessel gave rise to conus  branch, two right ventricular branches, a posterior descending branch  and a posterolateral branch.  There was 50% proximal, 40% mid and 40%  distal stenosis in the right coronary artery.   The left ventriculogram performed in the RAO projection showed global  hypokinesis with an estimated ejection fraction of 60%.   The aortic pressure was 105/64 with a mean of 81 and left ventricular  pressure was 105/14.   CONCLUSION:  Coronary artery disease status post prior stenting of a  lesion in the proximal LAD with a drug-eluting stent with 0% stenosis at  stent site in the proximal LAD, 90% focal stenosis after the stent in  the mid-LAD, 50% ostial stenosis of the diagonal branch of the LAD, 40%  mid and 70% distal stenosis in the circumflex artery, 50% proximal, 40%  mid and 40%  distal stenosis in the right coronary artery with global  hypokinesis and estimated ejection fraction of 30%.   RECOMMENDATIONS:  The patient developed new disease in the mid LAD  distal to the stent.  He has a persistent ischemic cardiomyopathy with  ejection fraction of 30% status post ICD.  We will plan to treat the  lesion in the mid LAD and we will probably use a new drug-eluting stent  which will need to overlap the old drug-eluting stent.      Bruce Elvera Lennox Juanda Chance, MD, [facility]  Electronically Signed     BRB/MEDQ  D:  12/16/2006  T:  12/16/2006  Job:  [id number]   cc:   Luis Abed, MD, [facility]  Doylene Canning. Ladona Ridgel, MD  Cardiopulmonary Lab

## 2010-12-19 NOTE — Assessment & Plan Note (Signed)
[facility]                            CARDIOLOGY OFFICE NOTE   NAME:Kyle, [name]                    MRN:          [id number]  DATE:11/17/2007                            DOB:          1931-11-13    [name] is a delightful gentleman who has coronary disease and an  ICD in place.  His last intervention to the LAD was in May 2008.  He  tells me today that he does have some chest discomfort with exertion.  He also has some jaw discomfort.  He is not particularly worried about  this but I am concerned that he may have recurrent ischemia.  It is of  note that when his pacemaker was turned to rate-response, he had  significant symptoms and in the rate-response was turned off.  However,  he also notices now that his heart rate is always on the slow side.  I  wonder if some of his symptoms could be chronotropic incompetence from  his pacemaker.   PAST MEDICAL HISTORY:   ALLERGIES:  He does not tolerate CHOLESTEROL MEDICINES well but now he  is tolerating simvastatin well.   MEDICATIONS:  1. Plavix 75.  2. Altace 10.  3. Multivitamin.  4. B12 injection monthly.  5. Gemfibrozil.  6. Aspirin 81.  7. Fish oil.  8. Toprol.  9. Simvastatin 80.  10.Meloxicam.   OTHER MEDICAL PROBLEMS:  See the list below.   REVIEW OF SYSTEMS:  Other than the HPI his review of systems is  negative.   PHYSICAL EXAM:  Blood pressure today is 150/61 with a pulse of 51.  The patient is oriented to person, time, and place.  Affect is normal.  HEENT:  Reveals no xanthelasma.  He has normal extraocular motion.  There are no carotid bruits.  There is no jugular venous distention.  Lungs are clear.  Respiratory effort is not labored.  Cardiac exam reveals S1-S2.  There are no clicks or significant murmurs.  The abdomen is soft.  He has no significant peripheral edema.  There are no significant  musculoskeletal deformities.   His rhythm is paced at a rate of 50.   PROBLEMS:  1. History of left bundle branch block.  2. TAXUS stent in 2005 and repeat TAXUS stent in May 2008.  3. History of syncope with arrhythmia in 2005 and an ICD in place.  4. The patient felt poorly when his ICD was turned to a rate-response      mode and this was turned back off.  5. Gastroesophageal reflux disease.  6. History of rectal fissure in the past.  7. Intolerance to VYTORIN, but he now tolerates simvastatin.  8. Status post cholecystectomy.  9. **Return of some chest discomfort with walking and also some jaw      discomfort.  We need to see if he is having marked ischemia.      Adenosine Myoview scan will be done and I will see him back.  We      will also need to keep in mind that he may need to have his pacing  rate adjusted.  It may be helpful for [place], when he returns, to have      him walk to see what happens to his heart rate.  I did not have him      do a treadmill Myoview because I felt that adenosine might be best      for the images with his paced rhythm.     Luis Abed, MD, [facility]  Electronically Signed    JDK/MedQ  DD: 11/17/2007  DT: 11/17/2007  Job #: [id number]

## 2010-12-19 NOTE — Assessment & Plan Note (Signed)
[facility]                                 ON-CALL NOTE   NAME:WILLIAMSErrin, [name]                    MRN:          [id number]  DATE:12/14/2006                            DOB:          1932/07/20    ON-CALL PROGRESS NOTE:  I received a call from today from Corene Cornea,  a relative of [name], stating that [name] saw Dr. Ladona Ridgel  yesterday and had adjustment of his ICD and this morning, has been  having exertional chest discomfort. In reviewing Dr. Lubertha Basque notes, the  patient's device demonstrated poor AV conduction with no conduction at a  rate of 45 beats per minute and therefore, the device was re-programmed  to the DDDR mode and his AV delay was turned from 300, down to 200. His  lower rate was also increased from 50 to 60. It is not clear to me that  this would cause the patient to have chest pain and as a result, I  advised that they present to the emergency room for additional  evaluation to rule out MI, as well as potential ICD interrogation and  manipulation. They plan to come into the ED. It was understanded.     Nicolasa Ducking, ANP  Electronically Signed    CB/MedQ  DD: 12/14/2006  DT: 12/14/2006  Job #: [id number]   cc:   Luis Abed, MD, [facility]  Doylene Canning. Ladona Ridgel, MD

## 2010-12-21 ENCOUNTER — Other Ambulatory Visit: Payer: Self-pay | Admitting: Internal Medicine

## 2010-12-21 ENCOUNTER — Ambulatory Visit (INDEPENDENT_AMBULATORY_CARE_PROVIDER_SITE_OTHER): Payer: BC Managed Care – PPO | Admitting: *Deleted

## 2010-12-21 DIAGNOSIS — I428 Other cardiomyopathies: Secondary | ICD-10-CM

## 2010-12-21 DIAGNOSIS — Z95810 Presence of automatic (implantable) cardiac defibrillator: Secondary | ICD-10-CM

## 2010-12-21 DIAGNOSIS — I442 Atrioventricular block, complete: Secondary | ICD-10-CM

## 2010-12-22 NOTE — Assessment & Plan Note (Signed)
[facility]                              CARDIOLOGY OFFICE NOTE   NAME:[name], [name]                    MRN:          [id number]  DATE:05/31/2006                            DOB:          08/28/1931    [name] is back today to followup his history of chest pain and to  review the results of his study. I had seen him on May 06, 2006. At that  time, he had had some chest discomfort and we decided proceed with a  followup Myoview scan. This study shows old injury but no ischemia and his  ejection fraction is in the 47% range. He is not having any significant  recurring chest pain. He is quite stable.   I reviewed all of the data with the patient and his daughter. We talked  about the fact also that he is tolerating Vytorin and his LDL is quite good.   Today, his blood pressure is 159/80. He has not had increased blood pressure  in recent checks and I am not inclined to adjust his medicines at this time.   The problems are listed completely on my note of May 06, 2006.   Coronary disease with a Taxus stent in 2005. He is quite stable. He is on  all of his medicines. His Myoview shows no significant ischemia. No change  in his medicines and I will see him back in one year.    ______________________________  Luis Abed, MD, [facility]    JDK/MedQ  DD: 05/31/2006  DT: 06/01/2006  Job #: [id number]

## 2010-12-22 NOTE — Discharge Summary (Signed)
NAME:  ANTAVIUS, SPERBECK                       ACCOUNT NO.:  [id number]   MEDICAL RECORD NO.:  [id number]                   PATIENT TYPE:  INP   LOCATION:  6529                                 FACILITY:  [facility]   PHYSICIAN:  [place] Bing, M.D.               DATE OF BIRTH:  Jul 23, 1932   DATE OF ADMISSION:  12/09/2003  DATE OF DISCHARGE:  12/17/2003                           DISCHARGE SUMMARY - REFERRING   SUMMARY OF HISTORY:  Mr. Gellatly is a 77 year old white male who presented  to the emergency room on the day of admission with a syncopal episode that  occurred at work.  He stated that he was opening the door for a coworker at  approximately 6:30 a.m., he smiled and then fell to the floor per a witness.  He did not have any pre- or post-symptoms, or seizure activity according to  the patient and bypasses.  The only injury sustained with this syncopal  episode was a loss of a tooth.   PAST MEDICAL HISTORY:  Notable for known coronary artery disease with an EF  of approximately 25-35%, chronic left bundle branch block, old myocardial  infarction, history of congestive heart failure, and hypertension.   LABORATORY DATA:  Chest x-ray obtained on Dec 12, 2003, did not show any  active disease.  On the 10th, post ICD, does not show any pneumothorax.   Admission H&H was 14.3 and 41.2, normal indices, platelets 259, WBC is 8.8.  PT was 12.1, PTT was 30.  Sodium 142, potassium 4.4, BUN 14, creatinine 1.0,  glucose 101.  Serial CK-MBs and troponins were negative x3.  TSH was 3.396.  Fasting lipids on Dec 10, 2003, showed a total cholesterol 188, triglycerides  77, HDL 49, LDL 124.  Subsequent chemistries and hematologies were  unremarkable on Dec 17, 2003, prior to discharge.  H&H was 13.0 and 37.2,  normal indices, platelets 185, and WBC 6.3.  Sodium 136, potassium 4.0, BUN  12, creatinine 0.9.  Post-stenting CK-MB was 43 and 1.8.   HOSPITAL COURSE:  Dr. Felicity Coyer initially admitted the  patient on Dec 09, 2003, with syncopal episode.  They asked Dr. Myrtis Ser, cardiology, to evaluate  the patient on the day of admission.  An echocardiogram was performed and  showed an EF of 25-35% with mild AI and mild MR.  Dr. Myrtis Ser felt that with  his syncope, this was very concerning and would need to perform cardiac  catheterization to re-assess his coronaries and consider EP study plus/minus  ICD.  He ruled out for myocardial infarction and did not have any problems  with chest discomfort.  Orthostatics were performed on Dec 10, 2003, by Dr.  Felicity Coyer and staff, and did show a slight drop from lying of 120/78 with a  pulse of 94 to a blood pressure of 102/70 and a pulse of 96 standing.  However, patient was asymptomatic.  Cardiac catheterization was performed on  Dec 13, 2003, by Dr. Andee Lineman and this showed a 20-30% mid RCA, 30-40% distal  RCA, 20% distal RCA.  He had an 80-90% mid LAD and 50% large diagonal-2  lesion.  His EF was 25-30% with anterior apical hypokinesis and mild  inferior hypokinesis.  Dr. Andee Lineman discussed this with Dr. Ladona Ridgel and Dr.  Samule Ohm, and felt that he should have an EP study with ICD placement and  undergo percutaneous intervention on his LAD 2 to 3 days from his  catheterization.  On Dec 13, 2003, he underwent EP testing, which showed  inducible ventricular fibrillation.  Thus a St. Jude Epic plus DR was  inserted by Dr. Ladona Ridgel.  Postprocedure chest x-ray did not show any evidence  of pneumothorax.  On Dec 14, 2003, patient was doing well.  Dr. Myrtis Ser noted  that the patient feels statin is making him feel poorly, thus he held this  for now.  He was started on Plavix in anticipation of stent placement.  On  Dec 16, 2003, Dr. Riley Kill performed Taxus stenting to the LAD reducing this  from 95% to 0% without difficulty.  By Dec 17, 2003, he was ambulating  without difficulty.  Dr. Dietrich Pates made some further adjustments on his  medications and felt that the patient should  continue on his Pravachol.   DISCHARGE DIAGNOSES:  1. Syncope.  2. Coronary artery disease status post Taxus stenting to the left anterior     descending as previously described.  3. Inducible ventricular fibrillation on electrophysiologic, study status     post implantable cardioverter defibrillator of St. Jude device.  4. Asymptomatic orthostatic hypotension.  5. Hyperlipidemia.  6. History as previously.   DISPOSITION:  He is discharged home.   His new medications include:  1. Altace 10 mg daily.  2. Plavix 75 mg daily.  3. Nitroglycerin 0.4 as needed.  4. His aspirin was decreased to 81 mg daily.  5. His Toprol was increased to 100 mg daily.  6. He was asked to resume his Pravachol 40 mg q.h.s.  7. He was instructed not to take his Procardia.  8. Tylenol as directed for pain.   Dr. Dietrich Pates gave him permission to return to work on Dec 27, 2003.  Maintain low salt, fat and cholesterol diet.  If he had any problems with  his catheterization site, he was asked to call us.  He received supplemental  discharge instructions for pacemaker defibrillator patients in regards to  activity.  He was advised not to drive until after being seen by Dr. Ladona Ridgel  in August.  He was given permission to shower on Dec 20, 2003.  He will have  an ICD check on Dec 30, 2003, at 9:15 a.m.  He will see Dr. Myrtis Ser on Dec 31, 2003, at 10:30 a.m. and Dr. Ladona Ridgel on March 22, 2004, at 12:10 p.m.  At the  time of follow up with Dr. Myrtis Ser, review of statin and hyperlipidemia should  be pursued with his continued elevated LDL, consider changing statins or  adding Zetia.  Orthostatic blood pressures should also be checked with  adjustments of his medications.      Joellyn Rued, P.A. [facility]                    [place] Bing, M.D.    EW/MEDQ  D:  12/17/2003  T:  12/17/2003  Job:  [id number]   cc:   [name], M.D.   Dr. Maurice Small M.  Kriste Basque, M.D. [facility]

## 2010-12-22 NOTE — Cardiovascular Report (Signed)
NAME:  [name], [name]                       ACCOUNT NO.:  [id number]   MEDICAL RECORD NO.:  [id number]                   PATIENT TYPE:  INP   LOCATION:  3743                                 FACILITY:  [facility]   PHYSICIAN:  Learta Codding, M.D. [facility]             DATE OF BIRTH:  02-06-1932   DATE OF PROCEDURE:  12/13/2003  DATE OF DISCHARGE:  12/17/2003                              CARDIAC CATHETERIZATION   PROCEDURE PERFORMED:  1. Left heart catheterization with selective coronary angiography.  2. Ventriculography.   DIAGNOSIS:  1. Single-vessel coronary artery disease of the left anterior descending     with high grade stenosis.  2. Left ventricular systolic dysfunction.  3. History of anterior wall myocardial infarction.  4. History of recent syncope.   INDICATION:  The patient is a 77 year old male with history of known  coronary artery disease.  The patient is status post anterior wall  myocardial infarction in 1989.  He now presents with syncope.  His ejection  fraction is 25-30% by echocardiography.  The patient referred for cardiac  catheterization to assess his coronary anatomy prior to placement of ICD  device.   DESCRIPTION OF PROCEDURE:  After informed consent was obtained, the patient  was brought to the catheterization laboratory.  The right groin was  sterilely prepped and draped.  6-French arterial sheath was placed using the  modified Seldinger technique accessing the right femoral artery.  6 [place]  JL-4 and JR-4 catheters were used for coronary angiography.  A 6 French  angled pigtail catheter was used for ventriculography.  No complications  were encountered during the procedures. After termination of the procedure,  the patient was brought back to the holding area and all catheters and  sheaths were removed after adequate hemostasis was obtained.   FINDINGS:   HEMODYNAMICS:  Left ventricular pressure 126/58 mmHg, aortic pressure 126/2  mmHg.   VENTRICULOGRAPHY:  Global hypokinesis with an ejection fraction of 25-30%.  There was anterior moderate hypokinesis and inferior wall there was mild  hypokinesis.  There was no significant mitral regurgitation.   SELECTIVE CORONARY ANGIOGRAPHY:  1. Left main coronary artery:  Large caliber vessel with no evidence of flow-     liming disease.  2. The left anterior descending artery was a moderate size vessel extending     past the apex.  There was a very small first diagonal branch and a larger     bifurcating second diagonal branch.  There is a bifurcation lesion     starting from the second diagonal branch extending into the mid LAD of     approximately 50%.  In the more distal aspect, there is a high grade     stenosis of 80-90%.  There is clear bifurcational disease involving the     ostium of the second diagonal branch which is approximately 50% and a mid     stenosis of 30% in this diagonal  branch.  A small first septal and a     larger second septal perforator was present.  3. The circumflex coronary artery was a large caliber vessel with no     evidence of flow-limiting disease.  4. Right coronary artery was a large caliber vessel with stenosis in the mid     vessel of approximately 20-30%, distal 30-40% and just prior to the take     off of the PDA approximately 20% diffuse stenosis.  The PDA was otherwise     free of flow-limiting disease as well as posterolateral branch.   CONCLUSION:  Significant single vessel coronary artery disease involving the  mid LAD and bifurcational lesion involving the second diagonal branch.  The  results were discussed with Dr. Ladona Ridgel and Dr. Samule Ohm. It was felt according  to Dr. Ladona Ridgel, that the patient is safe to proceed with ICD placement today  particularly in light of avoidance of Plavix prior to ICD placement.  The  plan is then to proceed in two to three days with percutaneous coronary  intervention to the left anterior descending.                                                Learta Codding, M.D. [facility]    GED/MEDQ  D:  12/13/2003  T:  12/13/2003  Job:  [id number]   cc:   Doylene Canning. Ladona Ridgel, M.D.

## 2010-12-22 NOTE — Cardiovascular Report (Signed)
NAME:  [name], [name]                       ACCOUNT NO.:  [id number]   MEDICAL RECORD NO.:  [id number]                   PATIENT TYPE:  INP   LOCATION:  6599                                 FACILITY:  [facility]   PHYSICIAN:  Arturo Morton. Riley Kill, M.D. [facility]         DATE OF BIRTH:  04/01/1932   DATE OF PROCEDURE:  12/16/2003  DATE OF DISCHARGE:                              CARDIAC CATHETERIZATION   INDICATIONS:  Mr. Seese is a delightful 77 year old who had an ICD  placed.  He has done well.  However, he has a high-grade lesion in the left  anterior descending artery.  He was brought back to the catheterization  laboratory for percutaneous coronary intervention.  He had been pretreated  with Plavix.  The risks, benefits, and alternatives were discussed with the  patient.  We discussed the potential for complications, specifically related  to  branch occlusion.  The patient was agreeable to proceed.   PROCEDURE:  Percutaneous stenting of the left anterior descending artery  using a Taxus drug-eluting stent.   DESCRIPTION OF PROCEDURE:  The patient was brought to the catheterization  lab and prepped and draped in the usual fashion.  Through an anterior  puncture, the left femoral artery was entered.  A 7-French sheath was  placed.  Initial bolus of bivalirudin was given, according to protocol.  However, ACT checked sequentially revealed ACTs of less than 200.  At this  point, we asked the cath lab technologist to recheck the left arm.  The IV  had been running well at the beginning of the procedure.  There was evidence  of some infiltration.  As a result, this IV was discontinued and a new IV  was started.  A second bolus of bivalirudin was administered and an ACT was  just over 300 seconds.   A JL-3.5 guiding catheter was utilized and the lesion was crossed with a  high-torque floppy wire.  Initial dilatation was done with a 2.5 quantum  Maverick balloon 20 mm length.  Following  predilatation, the area was  covered with a 32 x 2.75 Taxus drug-eluting stent.  We laid this across the  large diagonal branch which, itself, did not have significant narrowing at  its ostium.  Because of the length the stent necessarily did in an area of  mild luminal irregularity in the LAD distally.   The stent was taken up to about 14 atmospheres.  Postdilatation was then  done with a 3-0 power sail up to 15 atmospheres throughout the course of the  stent with careful attention to keep the edges of the ballon within the  edges of the stent.  There was marked improvement in the appearance of the  artery.  Following intracoronary nitroglycerin there was no substantial  degradation in the side branch.  All catheters were subsequently removed and  the femoral sheath sewn into place.  He was taken to the holding area in  satisfactory clinical  condition.   ANGIOGRAPHIC DATA:  The left anterior descending artery courses to the apex.  There is a high-grade stenosis in the midvessel just after the takeoff of  the diagonal.  The diagonal itself does  not have high-grade ostial disease  although it does have some mild 40% proximal plaquing.  The lesion length  itself is just over 20 mm. As a result, we placed a 32-mm length drug-  eluting stent which was successfully postdilated.  There did not appear to  be side branch deterioration.  The distal LAD has about 20-30% area of  narrowing just distal to the end of the stent.  Distally the vessel is  intact.  There was no evidence of proximal or distal edge dissection with  the stent at deployment.   CONCLUSIONS:  1. Successful percutaneous stenting of the left anterior descending artery     using a drug-eluting stent overlapping the     origin of the moderate size diagonal branch with 40-50% proximal     irregularity.  2. The patient will require aspirin and Plavix for a minimum of 6 months and     preferably for 1 year.  The decision for this  timing will be deferred to     Dr. Myrtis Ser.                                               Arturo Morton. Riley Kill, M.D. [facility]    TDS/MEDQ  D:  12/16/2003  T:  12/17/2003  Job:  [id number]   cc:   Arturo Morton. Riley Kill, M.D. [facility]   [name], M.D.   [name]   Doylene Canning. Ladona Ridgel, M.D.

## 2010-12-22 NOTE — Op Note (Signed)
NAME:  [name], [name]             ACCOUNT NO.:  [id number]   MEDICAL RECORD NO.:  [id number]          PATIENT TYPE:  INP   LOCATION:  5502                         FACILITY:  [facility]   PHYSICIAN:  Sandria Bales. Ezzard Standing, M.D.  DATE OF BIRTH:  June 26, 1932   DATE OF PROCEDURE:  04/06/2005  DATE OF DISCHARGE:                                 OPERATIVE REPORT   PREOPERATIVE DIAGNOSIS:  Cholecystitis with cholelithiasis.   POSTOPERATIVE DIAGNOSIS:  Chronic cholecystitis with cholelithiasis.   PROCEDURE:  Laparoscopic cholecystectomy with interoperative cholangiogram.   SURGEON:  Sandria Bales. Ezzard Standing, M.D.   ASSISTANT:  None.   ANESTHESIA:  General endotracheal anesthesia.   ESTIMATED BLOOD LOSS:  Minimal.   INDICATIONS FOR PROCEDURE:  [name] is a 77 year old white male  patient of Dr. Alroy Dust, who had some right upper quadrant pain, nausea  and vomiting, presented to the hospital.  An ultrasound showed that he had  stones.  His liver function showed a bilirubin of 6.1.  He underwent an ERCP  by Dr. Melvia Heaps, who identified no common bile duct stones.  He now  comes today for attempted laparoscopic cholecystectomy.   The indications and potential complications were explained to the patient.  Potential complications include but are not limited to bleeding, infection,  bile duct injury, the possibility of open surgery.   DESCRIPTION OF PROCEDURE:  The patient was placed in a supine position.  He  was already on Zosyn as an antibiotic.  He had PAS stockings in place.  His  abdomen was prepped with Betadine solution and sterilely draped.   An infraumbilical incision was made with sharp dissection carried down to  the abdominal cavity.  A 0 degree 10 mm laparoscope was inserted through a  12 mm Hasson trocar and secured with a 0 Vicryl suture.  I put a 10 mm  trocar in the subxiphoid location, a 5 mm right mid subcostal, a 5 mm right  lateral subcostal trocar.  The gallbladder  was identified, grasped, rotated  cephalad.  He had a lot of adhesions along the anterior surface down to the  duodenum and this probably occupied 1/2 to 2/3 of the gallbladder.  The  adhesions were taken down.   I then shot an interoperative cholangiogram.  The interoperative  cholangiogram was shot using a cut off taut catheter.  I first placed a clip  on the gallbladder side of the cystic duct.  I placed a taut catheter into  the abdomen with a 14 gauge Jelco.  I placed a taut catheter into the side  of the cut cystic duct and secured it with an endoclip.  Interoperative  cholangiogram using approximately 12 mL of half strength Hypaque, I shot it  in two segments, identified flow of Hypaque down the cystic duct into the  common bile duct up the hepatic radicals.  The common bile duct emptied,  albeit slowly.  I saw no filling defects or masses.  Again, I talked to Dr.  Arlyce Dice who had swept the duct with the ERCP and saw no stones.  He did have  little  tiny millet seed stones, which are probably 1-2 mm in size, that I  did milk out of the cystic duct and I wondered if he had just dropped one of  those through which had passed through.  I felt the common bile duct was  normal except for probably slow emptying, I saw no filling defects.  The  taut catheter was then removed, the cystic duct triply endoclipped and  divided.   The cystic artery was identified, doubly endoclipped and divided.  The  triangle of Calot was thoroughly inspected, there was no bleeding or bile  leak.   I then took the gallbladder off the liver using mainly hook Bovie  electrocautery.  Prior to complete division of the gallbladder from the  gallbladder bed, I re-visualized the gallbladder bed and triangle of Calot,  I saw no bleeding or bile leak.  I placed a piece of Surgicel in the  gallbladder and saw he had no bleeding.  He has been off Plavix and aspirin  since two days ago.  I then delivered the gallbladder  through the umbilicus  in an endocatch bag.   I then removed each trocar in turn.  I inspected closely the trocar site to  make sure there was no bleeding which there was none.  The umbilical port  was closed with a 0 Vicryl suture, the skin at each site was closed with a 5-  0 Vicryl suture, painted with tincture of Benzoin and Steri-Strips placed.  The patient tolerated the procedure well and was transported to the recovery  room in good condition.  Sponge count and needle counts were correct at the  end of the case.      Sandria Bales. Ezzard Standing, M.D.  Electronically Signed     DHN/MEDQ  D:  04/06/2005  T:  04/06/2005  Job:  [id number]   cc:   Lonzo Cloud. Kriste Basque, M.D. [facility]  [address]  [place]  [place] [address]   Barbette Hair. Arlyce Dice, M.D. [facility]  [address]  [place]  [place] [address]

## 2010-12-22 NOTE — Consult Note (Signed)
NAME:  [name], OKERLUND                       ACCOUNT NO.:  [id number]   MEDICAL RECORD NO.:  [id number]                   PATIENT TYPE:  INP   LOCATION:  3743                                 FACILITY:  [facility]   PHYSICIAN:  Doylene Canning. Ladona Ridgel, M.D.               DATE OF BIRTH:  04-Dec-1931   DATE OF CONSULTATION:  12/13/2003  DATE OF DISCHARGE:                                   CONSULTATION   REFERRING PHYSICIAN:  [name], M.D.   INDICATION FOR CONSULTATION:  Evaluation of syncope in the setting of  ischemic cardiomyopathy and left bundle branch block.   HISTORY OF PRESENT ILLNESS:  The patient is a 77 year old male with known  coronary disease, status post anterior MI many years ago.  He has severe LV  dysfunction with an ejection fraction in the past that was as high as 40%  but most recently has been 25-30%.  He has a history of left bundle branch  block as well.  He was in his usual state of health until the day of  admission, when he had a sudden episode of syncope with no warning lasting  seconds.  The patient on lying on the ground felt confused and fatigued but  otherwise was stable.  He denied chest pain or shortness of breath.  He does  have class I-II congestive heart failure symptoms and gets fatigued with any  significant exertion.  Serial cardiac enzymes demonstrated no evidence of  myocardial infarction.  He underwent catheterization, which demonstrated an  80% LAD stenosis in the midportion, which was not ulcerative and not  associated with thrombus.  He is now referred for ICD insertion with  ultimate plans to percutaneously intervene on his LAD stenosis.   The patient denies any previous history of syncope until the episode of  syncope that prompted his admission.  He is notable to have a 50% LAD  stenosis in 1992.  He has a history of hypertension.  He has a history of  left bundle branch block.   SOCIAL HISTORY:  The patient is married and lives in [place].   He works  at [employer].  He denies tobacco or ethanol use at present.   FAMILY HISTORY:  Notable for mother dying at age 46 and father dying of MI  at age 25.  He had one brother who has a history of bypass surgery and  another sister that died of a massive MI at age 69.   REVIEW OF SYSTEMS:  Notable for no fevers, chills, or night sweats.  He  denies any headache, vision or hearing problems.  He denies any skin rashes  or lesions.  He denies shortness of breath, dyspnea, or PND, except he does  have dyspnea with extreme exertion (class II).  He does have occasional  problems with arthralgias and arthritis.  He denies any deformities, joint  pains, or skin problems.  He denies  any nausea, vomiting, diarrhea, or  constipation.  He denies any polyuria or polydipsia, heat or cold  intolerance.  The rest of his review of systems was negative.   PHYSICAL EXAMINATION:  GENERAL:  He is a pleasant, well-appearing 71-year-  old man in no distress.  VITAL SIGNS:  The blood pressure is 140/74, the pulse 62 and regular.  Respirations were 18.  Temperature was 98.  HEENT:  Normocephalic, atraumatic.  The pupils were equal and round.  The  oropharynx was moist.  The sclerae were anicteric.  NECK:  No jugular venous distention.  The carotids were 1+ and symmetric.  The trachea was midline.  CHEST:  The lungs were clear bilaterally to auscultation.  There were no  wheezes, rales, or rhonchi.  CARDIOVASCULAR:  Regular rate and rhythm with normal S1 and S2.  I did not  appreciate any murmurs, rubs, or gallops.  ABDOMEN:  Soft, nontender, nondistended.  There was no organomegaly.  EXTREMITIES:  No cyanosis, clubbing, or edema.  The pulses were 2+ and  symmetric.   His EKG demonstrates normal sinus rhythm with left bundle branch block.   IMPRESSION:  1. Unexplained syncope.  2. Ischemic cardiomyopathy, ejection fraction of 25%.  3. Left bundle branch block.  4. History of hypertension.    DISCUSSION:  The patient's history and findings are very concerning for an  arrhythmia-mediated syncope.  He has evidence of conduction system disease  as manifested by his left bundle branch block, and he certainly could have  had transient complete heart block with Stokes-Adams syncope; however, he  could also have had a malignant ventricular arrhythmia that terminated  spontaneously, both of which could be potentially life-threatening.  After  careful discussion of the treatment options with the patient, I have  recommended that he undergo implantable cardioverter-defibrillator insertion  with dual-chamber pacing available.  The risks, benefits, goals, and  expectation of this procedure have been discussed with the patient, and he  would like to proceed.                                               Doylene Canning. Ladona Ridgel, M.D.    GWT/MEDQ  D:  12/13/2003  T:  12/14/2003  Job:  [id number]   cc:   Lonzo Cloud. Kriste Basque, M.D. [facility]   [name], M.D.

## 2010-12-22 NOTE — Assessment & Plan Note (Signed)
[facility]                              CARDIOLOGY OFFICE NOTE   NAME:[name], [name]                    MRN:          [id number]  DATE:05/06/2006                            DOB:          11/13/1931    Mr. Rozario is seen for cardiology followup.  I know him very well.  He has  coronary disease and he had syncope and required an ICD.  He has done well  with it.  Dr. Ladona Ridgel saw him in May 2005.   Recently, Mr. Michaux has noted that with walking out of the plant up a  hill he does get a little bit of tightness in his throat.  He has had this  in the past but he does wonder if this could possibly be a return of his  chest discomfort.  He has not had any nausea, vomiting, or diaphoresis with  this.  The sensation is in the middle of his chest.  It does not last for a  prolonged period of time.  He is not having any major shortness of breath.   PAST MEDICAL HISTORY:  Allergies:  No known drug allergies.  He does not  tolerate STATINS.   Medications:  1. Plavix 75.  2. Toprol-XL 50.  3. Altace 10.  4. Multivitamin.  5. B12 injections monthly.  6. Gemfibrozil.  7. Vytorin 10/40.   Historically, he has not tolerated ACE inhibitors but it appears that Dr.  Kriste Basque has been able to get him on an ACE.   Other medical problems:  See the complete list below.   REVIEW OF SYSTEMS:  He is actually doing well.  He is not having any GI or  GU symptoms.  It is of interest that he had lost a tooth when he had his  syncopal episode in the past.  Today he is seen and he has an upper denture  in place and all teeth are, of course, in place.  Otherwise, his review of  systems is negative.   PHYSICAL EXAMINATION:  GENERAL:  The patient is quite stable.  He is  comfortable in the room and appears quite stable.  He is oriented to person,  time and place, and his affect is normal.  HEENT:  Reveals no xanthelasma.  He has normal extraocular motion.  There  are no carotid bruits.  There is no jugular venous distention.  CARDIAC:  Reveals an S1 with an S2.  There are no significant murmurs.  LUNGS:  Clear.  Respiratory effort is not labored.  BACK:  He has no kyphosis or scoliosis.  ABDOMEN:  Soft.  There are no masses or bruits.  EXTREMITIES:  He has no peripheral edema.  There are 2+ distal pulses.   EKG is paced.   PROBLEMS:  1. History of left bundle-branch block.  2. Coronary disease with Taxus stent in 2005.  We are planning to keep him      on Plavix indefinitely.  3. History of syncope with arrhythmias in 2005 and he has an ICD in place.  4. Gastroesophageal reflux disease.  5. History  of a rectal fissure that has improved.  6. History of intolerance to STATINS historically, but it appears that he      is on Vytorin at this time and tolerating it.  7. Status post cholecystectomy.  The patient had acute cholecystitis and      laparoscopic cholecystectomy done by Dr. Ovidio Kin on April 06, 2005.   The patient has had return of some symptoms.  We need to be sure that this  is not ischemia.  He will have an adenosine Myoview and I will then see him  back for followup.            ______________________________  Luis Abed, MD, [facility]     JDK/MedQ  DD:  05/06/2006  DT:  05/07/2006  Job #:  [id number]

## 2010-12-22 NOTE — Op Note (Signed)
NAME:  [name], [name]                       ACCOUNT NO.:  [id number]   MEDICAL RECORD NO.:  [id number]                   PATIENT TYPE:  INP   LOCATION:  3743                                 FACILITY:  [facility]   PHYSICIAN:  Doylene Canning. Ladona Ridgel, M.D.               DATE OF BIRTH:  [birth year]   DATE OF PROCEDURE:  12/13/2003  DATE OF DISCHARGE:                                 OPERATIVE REPORT   PROCEDURE:  Insertion of a dual chamber defibrillator.   INDICATIONS FOR PROCEDURE:  Ischemic cardiomyopathy with an ejection  fraction of 25%, sudden syncope, left bundle branch block, class II heart  failure.   INTRODUCTION:  The patient is a 77 year old man with a history of prior  anterior myocardial infarction and an ejection fraction of 25%, who is  admitted to the hospital with a sudden unexplained episode of syncope.  He  has chronic left bundle branch block.  He has class II heart failure.  He is  now referred for ICD insertion (MADE IT II).   DESCRIPTION OF PROCEDURE:  After informed consent was obtained, the patient  was taken to the diagnostic EP lab in the fasting state.  After the usual  preparation and draping, intravenous fentanyl and midazolam were given for  sedation.  A total of 30 mL of lidocaine was infiltrated into the left  infraclavicular region.  A 7 cm incision was carried out over this region  and electrocautery utilized to dissect down to the fascial plane.  10 mL of  contrast injected into the left upper extremity venous system demonstrated a  patent left subclavian vein.  It was subsequently punctured x2 and the St.  Jude model 1488TC 52 cm active fixation pacing lead serial number [id number]  was advanced into the right atrium.  The St. Jude model 1580 65 cm active  fixation defibrillation lead serial number [id number] was advanced into the  right atrium as well.  At this point, mapping was carried out in the right  ventricle with the defibrillation lead.  At a point near  the RV apex, the  lead was actively fixed.  The R waves once active fixation was present were  9 millivolts and the pacing threshold was 0.5 volts at 0.5 milliseconds with  a pacing impedance of 749 ohms.  10-volt pacing did not stimulate the  diaphragm.  With the ventricular lead in satisfactory position, attention  was then turned to the atrial lead.  The P waves measured 4 millivolts and  the atrial threshold was 1.2 volts at 0.5 milliseconds with the lead  actively fixed.  Pacing impedance was 553 ohms.  Again 10-volt pacing did  not stimulate the diaphragm.  With both atrial and ventricular leads in  satisfactory position, they were secured to the subpectoralis fascia with a  figure-of-eight silk suture.  The sew-in sleeve was also secured with silk  suture.  Electrocautery was utilized to make  a subcutaneous pocket.  The St.  Jude Epic DR Plus model V-239 was connected to the defibrillation atrial  pacing leads and placed in the pocket.  The defibrillator serial number was  [id number].  With both atrial and defibrillation leads in satisfactory position  and secured to the new device, Kanamycin solution was utilized to irrigate  the incision.  Of course, electrocautery was utilized to form the pocket and  assure hemostasis.  At this point, the patient was more deeply sedated and  defibrillation threshold testing carried out.   After the patient was sedated with intravenous fentanyl and Versed, VF was  induced with a T wave shock.  An initial 12.5 joule shock was then delivered  which failed to terminate ventricular fibrillation.  A 20 joule shock was  then delivered restoring sinus rhythm.  Five minutes was allowed to elapse  and a second DFT test carried out.  Again VF was induced with a T wave shock  and a 17 joule shock was delivered terminating ventricular fibrillation and  restoring sinus rhythm.  At this point, no additional defibrillation testing  was carried out and the generator  was secured with a silk suture.  Additional Kanamycin was utilized to irrigate the pocket and the incision  closed with a layer of 2-0 Vicryl followed by a layer of 3-0 Vicryl followed  by a layer of 4-0 Vicryl.  Benzoin was painted on the skin, Steri-Strips  were applied, a pressure dressing placed, and the patient returned to his  room in satisfactory condition.   COMPLICATIONS:  There were no immediate complications.   RESULTS:  This demonstrates successful implantation of a St. Jude dual  chamber defibrillator in a patient with a history of sudden unexplained  syncope, ischemic cardiomyopathy with an ejection fraction of 20%, left  bundle branch block, and hypertension.                                               Doylene Canning. Ladona Ridgel, M.D.    GWT/MEDQ  D:  12/13/2003  T:  12/14/2003  Job:  [id number]   cc:   Lonzo Cloud. Kriste Basque, M.D. [facility]   [name], M.D.

## 2010-12-22 NOTE — Consult Note (Signed)
NAME:  [name], [name] NO.:  [id number]   MEDICAL RECORD NO.:  [id number]          PATIENT TYPE:  INP   LOCATION:  5502                         FACILITY:  [facility]   PHYSICIAN:  [name], M.D.     DATE OF BIRTH:  Sep 06, 1931   DATE OF CONSULTATION:  04/05/2005  DATE OF DISCHARGE:                                   CONSULTATION   REASON FOR CONSULTATION:  Gallstones.   [name] is a 77 year old male patient who began having right upper  quadrant pain, nausea, and vomiting on Tuesday of this week who presented to  his primary care physician yesterday and was immediately hospitalized.  Gallbladder ultrasound reveals cholelithiasis. His total bilirubin was 6.3  and his LFTs were mildly elevated. Of note his amylase and lipase were  normal as well as his white blood cell count. A GI consultation and an ERCP  was performed, but revealed normal ducts and no stones. We are asked to see  the patient regarding cholecystectomy.   PAST MEDICAL HISTORY:  Ischemic cardiomyopathy with an EF of 25%. He has  undergone an AICD placement followed by cardiology. His primary cardiologist  is Dr. Jerral Blevins. He has known coronary artery disease. He has a left bundle  branch block as well as hypertension. He also has hyperlipidemia and is on  multiple medications.   SOCIAL HISTORY:  He works at [employer]. He is married and lives in  [place]. No known tobacco or alcohol  use.   FAMILY HISTORY:  Both parents are deceased. His siblings are known to have  coronary artery disease.   ALLERGIES:  PRAVACHOL, ALL STATINS.   MEDICATIONS:  Unasyn, Zetia, Lopid, toprol, and Altace.   Lab studies as noted above.   X-ray of the chest reveals COPD. Ultrasound reveals cholelithiasis.   PHYSICAL EXAMINATION:  VITAL SIGNS: T-max 101.2, pulse 58, respirations 20,  blood pressure 112/58. O2 saturations 100% on room air.  HEENT: Grossly normal. No carotid or subclavian bruits. No  thyromegaly. No  thyromegaly.  CHEST: Clear to auscultation bilaterally. No wheezing or rhonchi.  HEART: Regular rate and rhythm with no gross murmur.  ABDOMEN: Soft. There is right upper quadrant tenderness. No guarding.  EXTREMITIES: Lower extremities reveal no peripheral edema.  NEUROLOGIC: Grossly intact.  SKIN: Warm and dry. There is a left anterior chest wall device (AICD).   ASSESSMENT/PLAN:  1.  Symptomatic cholelithiasis.  2.  Coronary artery disease.  3.  Ischemic cardiomyopathy.  4.  Syncope with resultant AICD placement.  5.  Left bundle branch block.  6.  Hypertension.  7.  Hyperlipidemia.   As mentioned above the ERCP is normal, the total bilirubin is trending  downward. We will repeat this again in the morning. Plan cholecystectomy in  the morning. In the meantime will have a cardiology consultation for  clearance.      [name], P.A.    ______________________________  [name], M.D.    LB/MEDQ  D:  04/05/2005  T:  04/05/2005  Job:  [id number]   cc:   [name], M.D.  [address]  [address]  [place]  [place] [address]   [name], M.D.  [address], [address]  [place]  [place] [address]

## 2010-12-22 NOTE — Consult Note (Signed)
NAME:  [name], [name]             ACCOUNT NO.:  [id number]   MEDICAL RECORD NO.:  [id number]          PATIENT TYPE:  INP   LOCATION:  5502                         FACILITY:  [facility]   PHYSICIAN:  Olga Millers, M.D. LHCDATE OF BIRTH:  [birth year]   DATE OF CONSULTATION:  04/05/2005  DATE OF DISCHARGE:                                   CONSULTATION   REFERRING PHYSICIAN:  Lonzo Cloud. Kriste Basque, M.D.   REASON FOR CONSULTATION:  Mr. Mcbain is a 77 year old male with a past  medical history of ischemic cardiomyopathy, status post ICD, coronary artery  disease, hypertension, hyperlipidemia, and now cholecystitis, who we were  asked to evaluate preoperatively.  The patient's last cardiac  catheterization was performed in May of 2005.  At that time, he was found to  have an ejection fraction of 25% to 30%.  There was a 50% mid LAD.  There  was a high-grade 80% to 90% stenosis in the distal LAD.  There was no other  obstructive disease noted.  The patient ultimately had an ICD placed during  that admission and also had a TAXUS drug-eluting stent in the LAD just after  the takeoff of the diagonal.  Since then, he has done well with no dyspnea  on exertion, orthopnea, PND, pedal edema, palpitations, presyncope, syncope  or chest pain.  He was admitted with abdominal pain and he has been  diagnosed with cholecystitis.  Cardiology was asked to evaluate  preoperatively.   PAST MEDICAL HISTORY:  There is no diabetes mellitus, but there is  hypertension and hyperlipidemia.  He has a history of cardiac disease as  outlined in the HPI.  He does have a history of prior rotator cuff surgery  and has had a prior pacemaker placement.   SOCIAL HISTORY:  He has a very remote history of tobacco use, but none  recently.  He does not consume alcohol.  His family history is negative for  coronary artery disease in his mother, but his father did have a myocardial  infarction.  He has also had a brother who is  status post CABG and a sister  who has had a myocardial infarction.   ALLERGIES:  He is allergic to PRAVACHOL.   PRESENT MEDICATIONS:  His present medications include:  1.  Unasyn.  2.  Toprol 100 mg p.o. daily.  3.  Altace 10 mg p.o. daily.  4.  Aspirin 81 mg p.o. daily.  5.  Zetia 10 mg p.o. daily.  6.  Lopressor 600 mg p.o. daily.  7.  His Plavix has been discontinued.   REVIEW OF SYSTEMS:  He denies any headaches, although he has had some fever.  There have been no chills.  He denies any productive cough or hemoptysis.  There is no dysphagia, odynophagia, melena or hematochezia.  There is no  dysuria or hematuria.  There is no rash or seizure activity.  There is no  orthopnea, PND or pedal edema.  There is no claudication noted.  He has had  abdominal pain from his cholecystitis.  The remainder of the systems are  negative.  PHYSICAL EXAMINATION:  VITAL SIGNS:  His physical examination today shows a  blood pressure of 119/54 and his pulse is 75.  He is afebrile.  GENERAL:  He is well-developed and well-nourished, and he does not appear to  be in no acute distress.  His skin is warm and dry.  He does not appear to  be depressed and there is no peripheral clubbing.  HEENT:  Unremarkable with the exception of mild scleral icterus.  His  eyelids are normal.  NECK:  Supple with normal upstrokes bilaterally and there are no bruits  noted.  There is no jugular venous distention and I cannot appreciate  thyromegaly.  CHEST:  Clear to auscultation with normal expansion.  CARDIOVASCULAR:  Exam reveals a regular rate and rhythm.  There is a 1-2/6  systolic murmur at the apex.  ABDOMEN:  There is no hepatosplenomegaly, but there is mild tenderness to  palpation in the right upper quadrant and epigastric area.  There are no  masses palpated.  I cannot appreciate abdominal bruit.  EXTREMITIES:  He has 2+ femoral pulses bilaterally and no bruits.  Extremities show no edema and I can  palpate no cords.  He has 2+ dorsalis  pedis pulses bilaterally.  NEUROLOGICAL:  Exam is grossly intact.   LABORATORY AND ACCESSORY CLINICAL DATA:  His electrocardiogram shows  atrioventricular pacing.   His chest x-ray shows no acute disease.   His hemoglobin and hematocrit are 13.7 with hematocrit of 39.1.  His BUN and  creatinine are 14 and 1.0.  His liver enzymes are elevated with an SGOT of  124 and an SGPT of 163.  His bilirubin is 5.2.   DIAGNOSES:  1.  Cholecystitis.  2.  Preoperative evaluation prior to cholecystectomy.  3.  Ischemic cardiomyopathy.  4.  Status post drug-eluting stent to the left anterior descending in 2005.  5.  Hypertension.  6.  Hyperlipidemia.   PLAN:  Mr. Delsol is for preoperative evaluation prior to a  cholecystectomy.  His last catheterization in 2005 is as outlined and he has  had no symptoms since then with no chest pain or shortness of breath.  Therefore, further preop cardiac evaluation is not indicated.  I would  continue his aspirin and beta blocker postoperatively (we could use IV  Lopressor until he is tolerating p.o. intake and then we can resume his  Toprol).  His Plavix is being held prior to his surgery, but I would resume  that postoperatively when Surgery feels comfortable that his chances of  bleeding have diminished.  We will be happy to follow while he is in the  hospital.  We will turn his ICD off during his procedure and then turn it  back on following the procedure.           ______________________________  Olga Millers, M.D. [facility]     BC/MEDQ  D:  04/05/2005  T:  04/06/2005  Job:  [id number]

## 2010-12-22 NOTE — Discharge Summary (Signed)
NAME:  SAVA, PROBY             ACCOUNT NO.:  [id number]   MEDICAL RECORD NO.:  [id number]          PATIENT TYPE:  INP   LOCATION:  5502                         FACILITY:  [facility]   PHYSICIAN:  Wilmon Arms. Corliss Skains, M.D. DATE OF BIRTH:  September 01, 1931   DATE OF ADMISSION:  04/04/2005  DATE OF DISCHARGE:  04/07/2005                                 DISCHARGE SUMMARY   DISCHARGE DIAGNOSIS:  1.  Chronic cholecystitis, cholelithiasis, status post laparoscopic      cholecystectomy with interoperative cholangiogram on April 06, 2005,      by Dr. Ovidio Kin.  2.  Elevated total bilirubin trending down upon discharge with normal ERCP.  3.  Ischemic cardiomyopathy with ejection fraction 25%.  4.  History of permanent pacemaker, ACID, secondary to syncope.  5.  Coronary artery disease.  6.  Chronic left bundle branch block.  7.  Hypertension.  8.  Allergy to all statins.   HOSPITAL COURSE:  Mr. Turman is a 77 year old male patient who began  having right upper quadrant pain, nausea, vomiting several days ago  presented to his primary care physician.  The patient was hospitalized and  found to have gallstones.  His total bilirubin was elevated to 6.1.  He had  an ERCP which was normal and no stones were seen.  We were asked to see the  patient for a cholecystectomy.  The patient underwent cholecystectomy on  April 06, 2005, laparoscopically with an interoperative cholangiogram.  He tolerated the procedure well and the following day was ready for  discharge to home.  Please note, the patient did have a cardiology consult  during the patient's hospitalization is secondary to his coronary artery  disease and his ICD.  The ICD was turned off during the surgery and then  reprogrammed postoperatively.  The patient was discharged home on April 07, 2005, in stable condition.   DISCHARGE MEDICATIONS:  Vicodin 1-2 tablets every 4 hours p.r.n. pain,  Toprol XL 100 mg daily, Altace 3 mg daily,  aspirin 81 mg daily, Zetia 10 mg  daily, Lasix 600 mg daily, Plavix 75 mg daily, multi-vitamin daily, B12  injections monthly.   DISCHARGE INSTRUCTIONS:  He is to follow up in 2-3 weeks with Dr. Ezzard Standing for  follow up with his laparoscopic cholecystectomy.  He is to have a home care  instruction sheet regarding laparoscopic cholecystectomy.      Guy Franco, P.A.      Wilmon Arms. Tsuei, M.D.  Electronically Signed    LB/MEDQ  D:  06/27/2005  T:  06/27/2005  Job:  [id number]   cc:   Lonzo Cloud. Kriste Basque, M.D. [facility]  [address]  [place]  [place] [address]   Sandria Bales. Ezzard Standing, M.D.  [address], [address]  [place]  [place] [address]

## 2010-12-27 NOTE — Progress Notes (Signed)
icd remote check  

## 2010-12-28 ENCOUNTER — Encounter: Payer: Self-pay | Admitting: *Deleted

## 2010-12-29 ENCOUNTER — Ambulatory Visit (INDEPENDENT_AMBULATORY_CARE_PROVIDER_SITE_OTHER): Payer: BC Managed Care – PPO

## 2010-12-29 DIAGNOSIS — D649 Anemia, unspecified: Secondary | ICD-10-CM

## 2010-12-29 MED ORDER — CYANOCOBALAMIN 1000 MCG/ML IJ SOLN
1000.0000 ug | Freq: Once | INTRAMUSCULAR | Status: AC
  Administered 2010-12-29: 1000 ug via INTRAMUSCULAR

## 2011-01-29 ENCOUNTER — Ambulatory Visit (INDEPENDENT_AMBULATORY_CARE_PROVIDER_SITE_OTHER): Payer: BC Managed Care – PPO

## 2011-01-29 DIAGNOSIS — D649 Anemia, unspecified: Secondary | ICD-10-CM

## 2011-01-29 MED ORDER — CYANOCOBALAMIN 1000 MCG/ML IJ SOLN
1000.0000 ug | Freq: Once | INTRAMUSCULAR | Status: AC
  Administered 2011-01-29: 1000 ug via INTRAMUSCULAR

## 2011-02-19 ENCOUNTER — Telehealth: Payer: Self-pay | Admitting: Pulmonary Disease

## 2011-02-19 DIAGNOSIS — I10 Essential (primary) hypertension: Secondary | ICD-10-CM

## 2011-02-19 DIAGNOSIS — E7800 Pure hypercholesterolemia, unspecified: Secondary | ICD-10-CM

## 2011-02-19 NOTE — Telephone Encounter (Signed)
Pls advise on labs to be done. 

## 2011-02-19 NOTE — Telephone Encounter (Signed)
Pt aware. Jennifer Castillo, CMA  

## 2011-02-19 NOTE — Telephone Encounter (Signed)
Labs ordered. LMOMTCB.

## 2011-02-19 NOTE — Telephone Encounter (Signed)
Lipid bmet  Hepatic  Needs to be fasting.

## 2011-02-26 ENCOUNTER — Other Ambulatory Visit (INDEPENDENT_AMBULATORY_CARE_PROVIDER_SITE_OTHER): Payer: BC Managed Care – PPO

## 2011-02-26 ENCOUNTER — Other Ambulatory Visit: Payer: Self-pay | Admitting: Pulmonary Disease

## 2011-02-26 DIAGNOSIS — I10 Essential (primary) hypertension: Secondary | ICD-10-CM

## 2011-02-26 DIAGNOSIS — E7800 Pure hypercholesterolemia, unspecified: Secondary | ICD-10-CM

## 2011-02-26 LAB — LIPID PANEL
Cholesterol: 163 mg/dL (ref 0–200)
Total CHOL/HDL Ratio: 4
VLDL: 40.4 mg/dL — ABNORMAL HIGH (ref 0.0–40.0)

## 2011-02-26 LAB — HEPATIC FUNCTION PANEL
ALT: 18 U/L (ref 0–53)
Bilirubin, Direct: 0.2 mg/dL (ref 0.0–0.3)
Total Bilirubin: 1.6 mg/dL — ABNORMAL HIGH (ref 0.3–1.2)

## 2011-02-26 LAB — BASIC METABOLIC PANEL
Chloride: 99 mEq/L (ref 96–112)
Creatinine, Ser: 0.9 mg/dL (ref 0.4–1.5)
Potassium: 3.9 mEq/L (ref 3.5–5.1)
Sodium: 137 mEq/L (ref 135–145)

## 2011-03-05 ENCOUNTER — Ambulatory Visit (INDEPENDENT_AMBULATORY_CARE_PROVIDER_SITE_OTHER): Payer: Medicare Other

## 2011-03-05 ENCOUNTER — Encounter: Payer: Self-pay | Admitting: Pulmonary Disease

## 2011-03-05 ENCOUNTER — Ambulatory Visit (INDEPENDENT_AMBULATORY_CARE_PROVIDER_SITE_OTHER): Payer: BC Managed Care – PPO | Admitting: Pulmonary Disease

## 2011-03-05 DIAGNOSIS — I442 Atrioventricular block, complete: Secondary | ICD-10-CM

## 2011-03-05 DIAGNOSIS — I10 Essential (primary) hypertension: Secondary | ICD-10-CM

## 2011-03-05 DIAGNOSIS — K219 Gastro-esophageal reflux disease without esophagitis: Secondary | ICD-10-CM

## 2011-03-05 DIAGNOSIS — Z95810 Presence of automatic (implantable) cardiac defibrillator: Secondary | ICD-10-CM

## 2011-03-05 DIAGNOSIS — D649 Anemia, unspecified: Secondary | ICD-10-CM

## 2011-03-05 DIAGNOSIS — I080 Rheumatic disorders of both mitral and aortic valves: Secondary | ICD-10-CM

## 2011-03-05 DIAGNOSIS — I2589 Other forms of chronic ischemic heart disease: Secondary | ICD-10-CM

## 2011-03-05 DIAGNOSIS — E7800 Pure hypercholesterolemia, unspecified: Secondary | ICD-10-CM

## 2011-03-05 DIAGNOSIS — I2510 Atherosclerotic heart disease of native coronary artery without angina pectoris: Secondary | ICD-10-CM

## 2011-03-05 DIAGNOSIS — M1990 Unspecified osteoarthritis, unspecified site: Secondary | ICD-10-CM

## 2011-03-05 DIAGNOSIS — I359 Nonrheumatic aortic valve disorder, unspecified: Secondary | ICD-10-CM

## 2011-03-05 DIAGNOSIS — E538 Deficiency of other specified B group vitamins: Secondary | ICD-10-CM

## 2011-03-05 NOTE — Patient Instructions (Signed)
Today we updated your med list in EPIC>    Continue your current meds the same...  We reviewed your recent labs & gave you a copy for your records...  Call for any questions...  Let's plan another follow up visit in 6 months.Marland KitchenMarland Kitchen

## 2011-03-05 NOTE — Progress Notes (Signed)
Subjective:    Patient ID: [name], male    DOB: 10/27/1931, 77 y.o.   MRN: [id number]  HPI 77 y/o WM here for a follow up visit & review of mult medical problems... 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  ~  August 30, 2009:  he continues to do well- no new complaints or concerns... BP controlled on meds; he saw DrKatz for f/u 10/10- doing well, no CP, mild MR, ICD in place, no change in Rx... Chol controlled on Simva + Gemfib + Fish Oil; still getting B12 shots monthly; has Flu shot 10/10 & due for PNEUMOVAX now.  ~  March 08, 2010:  feels well- no new complaints or concerns... he had ICD generator changed by DrTaylor 6/11- doing well post op... as noted he has CAD, ischemic cardiomyopathy, CHB w/ pacer/ ICD, he has class I-II symptoms and still works daily at [employer]... we discussed recheck FLP & we cut his Simva80 to 1/2 tab Qhs.  ~  September 05, 2010:  another good 39mo- now working three 12H shifts per week & denies CP, palpit, SOB, etc... he tells me he also walks regularly & notes that he's good for about 1/2 mile then his jaw starts to hurt & he has to stop... he saw DrTaylor 9/11- OK w/ new generator for his AICD;  he saw [facility] 10/11 & f/u 2DEcho showed mild LVH, decr LVF w/ EF= 35-40% & mild HK, gr1DD, mild AS & MR, mild LAdil ==> no change in meds... BP controlled, Chol looks good on 1/2 tab, he still gets monthly B12 shots...   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   Problem List:  HYPERTENSION (ICD-401.9) - controlled on TOPROL XL 100mg /d & ALTACE  10mg /d... takes meds regularly and tolerates well... BP 120/68 here, and OK at home per pt... he gets rare HA which he believes is related to his BP... denies fatigue, visual changes, CP, palipit, dizziness, syncope, dyspnea, edema, etc...  CAD (ICD-414.00) - on ASA 81mg /d & PLAVIX 75mg /d> followed by Delton See... ISCHEMIC CARDIOMYOPATHY (ICD-414.8) - he's had prev MI w/ mult caths/ interventions. CHRONIC SYSTOLIC HEART FAILURE (ICD-428.22) AORTIC STENOSIS (ICD-424.1) MITRAL REGURGITATION (ICD-396.3) ~  last cath 5/08 showed norm Lmain, patent stent in prox LAD, 80-90% lesion in mid-LAD w/ PCI & stent placed, & 50% ostial stenosis of 1st diagonal branch of LAD; 40%mid & 70%distal CIRC; 50%prox 40%mid 40%distal RCA; Global HK w/EF=30%. ~  NuclearStressTest 4/09 showed infer scar, no ischemia, EF=40%. ~  2DEcho 10/11 showed mild LVH, decr LVF w/ EF= 35-40% & mild HK, gr1DD, mild AS & MR, mild LAdil.  Hx of SYNCOPE (ICD-780.2) AV BLOCK, COMPLETE (ICD-426.0) AUTOMATIC IMPLANTABLE CARDIAC DEFIBRILLATOR SITU (ICD-V45.02) ~  Pacer/ ICD per DrTaylor w/ several adjustments in 2008... ~  5/11:  f/u DrTaylor w/ decision for elective generator change> done 6/11 & tol well. ~  He continues to f/u w/ DrTaylor every 6 months...  HYPERCHOLESTEROLEMIA (ICD-272.0) - takes SIMVASTATIN 80mg -  1/2 daily, GEMFIBRIZOL 600mg /d, & FISH OIL Bid... ~  FLP 08/14/07 on Simva40 showed TChol 163, TG 94, HDL 34, LDL 111... rec incr Simvastatin to 80mg /d. ~  FLP 02/24/08 on Simva80 showed TChol 136, TG 92, HDL 38, LDL 80... continue same. ~  FLP 1/10 showed TChol 125, TG 67, HDL 32, LDL 80... rec- continue same. ~  FLP 7/10 showed TChol 124, TG 86, HDL 36, LDL 71 ~  FLP 1/11 on Simva80+Gemfib600 showed TChol 137, TG 99, HDL 39, LDL 78 ~  FLP 8/11 on Simva80+Gemfib600 showed TChol 134, TG 82, HDL 36, LDL 82... Simva decr to 40. ~  FLP 1/12 on Simva40+Gemfib600 showed TChol 157, TG 139, HDL 38, LDL 92 ~  FLP 7/12 on  Simva40+Gemfib600 showed TChol 163, TG 202, HDL 43, LDL 96... Needs better low fat diet.  GERD (ICD-530.81) - he had ERCP in 8/06 w/ norm ducts, +gallstones, s/p lap chole 2006... he uses OTC meds Prn... ~  he notes that he had a colonoscopy in the past from DrPatterson, & he reports that it was neg.  DEGENERATIVE JOINT DISEASE (ICD-715.90) - he tried Mobic but it bothered his stomach, therefore uses Tylenol Prn.  VITAMIN B12 DEFICIENCY (ICD-266.2) - old chart not available- ? details of this Dx... on B12 shots xyrs. ~  labs 1/09 showed Hg=12.9 ~  labs 1/10 showed Hg= 12.7 ~  labs 1/11 showed Hg= 12.6 ~  labs 1/12 showed Hg= 13.2  Health Maintenance: ~  GI:  Followed by DrPatterson ~  GU:  PSA checked yearly & remains wnl... ~  Immunizations:  he gets the yearly Flu vaccine;  [place], he says;  given Pneumovax 1/11 age 40.   Past Surgical History  Procedure Date  . Icd implanted 2005  . Laparoscopic cholecystectomy 04/2005    Dr. Ezzard Standing    Outpatient Encounter Prescriptions as of 03/05/2011  Medication Sig Dispense Refill  . aspirin 81 MG tablet Take 81 mg by mouth daily.        . clopidogrel (PLAVIX) 75 MG tablet Take 75 mg by mouth daily.        . cyanocobalamin (,VITAMIN B-12,) 1000 MCG/ML injection Inject 1,000 mcg into the muscle every 30 (thirty) days.        . fish oil-omega-3 fatty acids 1000 MG capsule Take 2 g by mouth daily.        Marland Kitchen gemfibrozil (LOPID) 600 MG tablet Take 600 mg by mouth daily.        . metoprolol (TOPROL-XL) 100 MG 24 hr tablet Take 100 mg by mouth daily.        . Multiple Vitamins-Minerals (MENS MULTIVITAMIN PLUS PO) Take 1 tablet by mouth daily.        . ramipril (ALTACE) 10 MG capsule Take 10 mg by mouth daily.        . simvastatin (ZOCOR) 80 MG tablet       << He takes 1/2 tab daily >>  90 tablet  4    No Known Allergies   Current Medications, Allergies, Past Medical History, Past Surgical History, Family  History, and Social History were reviewed in [employer] record.    Review of Systems        See HPI - all other systems neg except as noted...  The patient complains of dyspnea on exertion.  The patient denies anorexia, fever, weight loss, weight gain, vision loss, decreased hearing, hoarseness, chest  pain, syncope, peripheral edema, prolonged cough, headaches, hemoptysis, abdominal pain, melena, hematochezia, severe indigestion/heartburn, hematuria, incontinence, muscle weakness, suspicious skin lesions, transient blindness, difficulty walking, depression, unusual weight change, abnormal bleeding, enlarged lymph nodes, and angioedema.     Objective:   Physical Exam     WD, WN, 77 y/o WM in NAD... wt=170# ht=6'1" BMI=22... GENERAL:  Alert & oriented; pleasant & cooperative... HEENT:  [place]/AT, EOM-wnl, PERRLA, EACs-clear, TMs-wnl, NOSE-clear, THROAT-clear & wnl. NECK:  Supple w/ fairROM; no JVD; normal carotid impulses w/o bruits; no thyromegaly or nodules palpated; no lymphadenopathy. CHEST:  Clear to P & A; without wheezes/ rales/ or rhonchi heard... HEART:  Pacer in L upper chest area~ nontender, RR, gr 1/6 SEM without rubs or gallops detected... ABDOMEN:  Soft & nontender; normal bowel sounds; no organomegaly or masses palpated... EXT: without deformities, mild arthritic changes; no varicose veins/ venous insuffic/ or edema. NEURO:  CN's intact; no focal neuro deficits apprec... DERM:  No lesions noted; no rash etc...   Assessment & Plan:   HBP>  Controlled on Metoprolol & Ramipril, continue same...  CAD, Cardiomyopathy, CHF, ValvHeartDis w/ AI & MR>  Followed by Delton See & stable on ASA/ Plavix, still working 3d per week etc...  Hx syncope, AVBlock, Pacer/AICD>  Followed by DrTaylor & stable, continue same...  LIPIDS>  TG=202 despite one gemfib daily, may need incr dose but he prefers better diet, same meds, etc...  DJD>  He uses Tylenol as needed; still  working 3d per week...  B-12 defic>  He remains on B12 shots monthly.Marland KitchenMarland Kitchen

## 2011-03-06 DIAGNOSIS — D649 Anemia, unspecified: Secondary | ICD-10-CM

## 2011-03-06 MED ORDER — CYANOCOBALAMIN 1000 MCG/ML IJ SOLN
1000.0000 ug | Freq: Once | INTRAMUSCULAR | Status: AC
  Administered 2011-03-06: 1000 ug via INTRAMUSCULAR

## 2011-03-22 ENCOUNTER — Other Ambulatory Visit: Payer: Self-pay | Admitting: Internal Medicine

## 2011-03-22 ENCOUNTER — Ambulatory Visit (INDEPENDENT_AMBULATORY_CARE_PROVIDER_SITE_OTHER): Payer: BC Managed Care – PPO | Admitting: *Deleted

## 2011-03-22 ENCOUNTER — Encounter: Payer: Self-pay | Admitting: Internal Medicine

## 2011-03-22 DIAGNOSIS — I428 Other cardiomyopathies: Secondary | ICD-10-CM

## 2011-03-22 DIAGNOSIS — I442 Atrioventricular block, complete: Secondary | ICD-10-CM

## 2011-03-22 DIAGNOSIS — Z95810 Presence of automatic (implantable) cardiac defibrillator: Secondary | ICD-10-CM

## 2011-03-22 LAB — REMOTE ICD DEVICE
AL AMPLITUDE: 2.4 mv
AL IMPEDENCE ICD: 450 Ohm
ATRIAL PACING ICD: 31 pct
BAMS-0001: 150 {beats}/min
BAMS-0003: 70 {beats}/min
BRDY-0004RV: 120 {beats}/min
RV LEAD THRESHOLD: 0.625 V
TZAT-0004SLOWVT: 8
TZAT-0012SLOWVT: 200 ms
TZAT-0013SLOWVT: 4
TZAT-0018SLOWVT: NEGATIVE
TZON-0003SLOWVT: 375 ms
TZST-0001SLOWVT: 3
TZST-0001SLOWVT: 5
TZST-0003SLOWVT: 36 J

## 2011-04-04 NOTE — Progress Notes (Signed)
icd remote check  

## 2011-04-06 ENCOUNTER — Ambulatory Visit (INDEPENDENT_AMBULATORY_CARE_PROVIDER_SITE_OTHER): Payer: BC Managed Care – PPO

## 2011-04-06 DIAGNOSIS — D649 Anemia, unspecified: Secondary | ICD-10-CM

## 2011-04-06 MED ORDER — CYANOCOBALAMIN 1000 MCG/ML IJ SOLN
1000.0000 ug | Freq: Once | INTRAMUSCULAR | Status: AC
  Administered 2011-04-06: 1000 ug via INTRAMUSCULAR

## 2011-04-18 ENCOUNTER — Encounter: Payer: Self-pay | Admitting: *Deleted

## 2011-04-20 ENCOUNTER — Telehealth: Payer: Self-pay | Admitting: *Deleted

## 2011-04-20 DIAGNOSIS — T82198A Other mechanical complication of other cardiac electronic device, initial encounter: Secondary | ICD-10-CM

## 2011-04-20 NOTE — Telephone Encounter (Signed)
Checking lead 

## 2011-04-23 ENCOUNTER — Ambulatory Visit (INDEPENDENT_AMBULATORY_CARE_PROVIDER_SITE_OTHER)
Admission: RE | Admit: 2011-04-23 | Discharge: 2011-04-23 | Disposition: A | Discharge status: Home / Self Care | Payer: BC Managed Care – PPO | Source: Ambulatory Visit | Attending: Internal Medicine | Admitting: Internal Medicine

## 2011-04-23 DIAGNOSIS — T82198A Other mechanical complication of other cardiac electronic device, initial encounter: Secondary | ICD-10-CM

## 2011-05-04 ENCOUNTER — Ambulatory Visit (INDEPENDENT_AMBULATORY_CARE_PROVIDER_SITE_OTHER): Payer: BC Managed Care – PPO

## 2011-05-04 DIAGNOSIS — D649 Anemia, unspecified: Secondary | ICD-10-CM

## 2011-05-07 DIAGNOSIS — D649 Anemia, unspecified: Secondary | ICD-10-CM

## 2011-05-07 MED ORDER — CYANOCOBALAMIN 1000 MCG/ML IJ SOLN
1000.0000 ug | Freq: Once | INTRAMUSCULAR | Status: AC
  Administered 2011-05-07: 1000 ug via INTRAMUSCULAR

## 2011-05-11 ENCOUNTER — Encounter: Payer: Self-pay | Admitting: Cardiology

## 2011-05-11 DIAGNOSIS — I447 Left bundle-branch block, unspecified: Secondary | ICD-10-CM | POA: Insufficient documentation

## 2011-05-11 DIAGNOSIS — I429 Cardiomyopathy, unspecified: Secondary | ICD-10-CM | POA: Insufficient documentation

## 2011-05-11 DIAGNOSIS — I10 Essential (primary) hypertension: Secondary | ICD-10-CM | POA: Insufficient documentation

## 2011-05-11 DIAGNOSIS — E785 Hyperlipidemia, unspecified: Secondary | ICD-10-CM | POA: Insufficient documentation

## 2011-05-11 DIAGNOSIS — R9430 Abnormal result of cardiovascular function study, unspecified: Secondary | ICD-10-CM | POA: Insufficient documentation

## 2011-05-11 DIAGNOSIS — I340 Nonrheumatic mitral (valve) insufficiency: Secondary | ICD-10-CM | POA: Insufficient documentation

## 2011-05-11 DIAGNOSIS — R55 Syncope and collapse: Secondary | ICD-10-CM | POA: Insufficient documentation

## 2011-05-11 DIAGNOSIS — I350 Nonrheumatic aortic (valve) stenosis: Secondary | ICD-10-CM | POA: Insufficient documentation

## 2011-05-11 DIAGNOSIS — IMO0002 Reserved for concepts with insufficient information to code with codable children: Secondary | ICD-10-CM | POA: Insufficient documentation

## 2011-05-11 DIAGNOSIS — Z95810 Presence of automatic (implantable) cardiac defibrillator: Secondary | ICD-10-CM | POA: Insufficient documentation

## 2011-05-11 DIAGNOSIS — I2510 Atherosclerotic heart disease of native coronary artery without angina pectoris: Secondary | ICD-10-CM | POA: Insufficient documentation

## 2011-05-11 DIAGNOSIS — K219 Gastro-esophageal reflux disease without esophagitis: Secondary | ICD-10-CM | POA: Insufficient documentation

## 2011-05-14 ENCOUNTER — Encounter: Payer: Self-pay | Admitting: Cardiology

## 2011-05-14 ENCOUNTER — Ambulatory Visit (INDEPENDENT_AMBULATORY_CARE_PROVIDER_SITE_OTHER): Payer: BC Managed Care – PPO | Admitting: Cardiology

## 2011-05-14 DIAGNOSIS — I340 Nonrheumatic mitral (valve) insufficiency: Secondary | ICD-10-CM

## 2011-05-14 DIAGNOSIS — R9430 Abnormal result of cardiovascular function study, unspecified: Secondary | ICD-10-CM

## 2011-05-14 DIAGNOSIS — I2510 Atherosclerotic heart disease of native coronary artery without angina pectoris: Secondary | ICD-10-CM

## 2011-05-14 DIAGNOSIS — I359 Nonrheumatic aortic valve disorder, unspecified: Secondary | ICD-10-CM

## 2011-05-14 DIAGNOSIS — I059 Rheumatic mitral valve disease, unspecified: Secondary | ICD-10-CM

## 2011-05-14 DIAGNOSIS — R0989 Other specified symptoms and signs involving the circulatory and respiratory systems: Secondary | ICD-10-CM

## 2011-05-14 DIAGNOSIS — I350 Nonrheumatic aortic (valve) stenosis: Secondary | ICD-10-CM

## 2011-05-14 DIAGNOSIS — R55 Syncope and collapse: Secondary | ICD-10-CM

## 2011-05-14 DIAGNOSIS — IMO0002 Reserved for concepts with insufficient information to code with codable children: Secondary | ICD-10-CM

## 2011-05-14 NOTE — Assessment & Plan Note (Signed)
Patient has had no recurrent syncope since his ICD was placed.  No further workup.

## 2011-05-14 NOTE — Patient Instructions (Signed)
Your physician wants you to follow-up in: 1 year. You will receive a reminder letter in the mail two months in advance. If you don't receive a letter, please call our office to schedule the follow-up appointment.  

## 2011-05-14 NOTE — Assessment & Plan Note (Signed)
Ejection fraction in October of 2011 was 35-40%.  Patient is on appropriate medications.  No further workup.

## 2011-05-14 NOTE — Assessment & Plan Note (Signed)
Coronary disease is stable.  He needs no further testing at this time.

## 2011-05-14 NOTE — Progress Notes (Signed)
HPI Patient is seen for followup coronary disease and history of syncope left ventricular dysfunction and ventricular arrhythmias.  Patient looks great.  He has not had any significant problems.  He has no chest pain.  He's had no palpitations.  He does about full activities.  When I saw him last in October, 2011 plans were made to do a followup 2-D echo.  His EF was 35-40%.  There was mild mitral regurgitation.  I was pleased with his results.  He follows with Dr. Ladona Ridgel concerning his ICD.  As part of today's evaluation I have carefully reviewed all of my old records and fully updated the new EMR. No Known Allergies  Current Outpatient Prescriptions  Medication Sig Dispense Refill  . aspirin 81 MG tablet Take 81 mg by mouth daily.        . clopidogrel (PLAVIX) 75 MG tablet Take 75 mg by mouth daily.        . cyanocobalamin (,VITAMIN B-12,) 1000 MCG/ML injection Inject 1,000 mcg into the muscle every 30 (thirty) days.        . fish oil-omega-3 fatty acids 1000 MG capsule Take 2 g by mouth daily.        Marland Kitchen gemfibrozil (LOPID) 600 MG tablet Take 600 mg by mouth daily.        . metoprolol (TOPROL-XL) 100 MG 24 hr tablet Take 100 mg by mouth daily.        . Multiple Vitamins-Minerals (MENS MULTIVITAMIN PLUS PO) Take 1 tablet by mouth daily.        . ramipril (ALTACE) 10 MG capsule Take 10 mg by mouth daily.        . simvastatin (ZOCOR) 80 MG tablet Take 1 tablet (80 mg total) by mouth every evening.  90 tablet  4    History   Social History  . Marital Status: Widowed    Spouse Name: N/A    Number of Children: 2  . Years of Education: N/A   Occupational History  . repairs looms at [employer]    Social History Main Topics  . Smoking status: Former Smoker    Types: Cigarettes    Quit date: 08/06/1952  . Smokeless tobacco: Not on file  . Alcohol Use: No  . Drug Use: No  . Sexually Active: Not on file   Other Topics Concern  . Not on file   Social History Narrative  . No narrative on  file    No family history on file.  Past Medical History  Diagnosis Date  . Hypertension   . CAD (coronary artery disease)     DES to LAD 2005 /  DES to LAD 2008  . LBBB (left bundle branch block)   . Cardiomyopathy     Ischemic, EF 30%, 2005  . Mitral regurgitation     Mild, echo, October, 2011  . Aortic stenosis     Mild, echo, October, 2011  . AV block, complete     Permanent pacemaker  . ICD (implantable cardiac defibrillator), single, in situ     for syncope and arrhythmia 2005 / generator change June, 2011  . Dyslipidemia   . GERD (gastroesophageal reflux disease)   . DJD (degenerative joint disease)   . Anemia, unspecified   . Vitamin B12 deficiency   . Ejection fraction < 50%     EF 30%, echo, 2005  /  EF 30%, catheterization, 2008  . Syncope     2005, ICD placed  Past Surgical History  Procedure Date  . Icd implanted 2005  . Laparoscopic cholecystectomy 04/2005    Dr. Ezzard Standing    ROS  Patient denies fever, chills, headache, sweats, rash, change in vision, change in hearing, chest pain, cough, nausea vomiting, urinary symptoms.  All other systems are reviewed and are negative.   PHYSICAL EXAM Patient is oriented to person time and place.  Affect is normal.  Head is atraumatic.  There is no xanthelasma.  There is no jugular venous distention.Lungs are clear.  Respiratory effort is not labored.  Cardiac exam reveals S1 and S2.  No clicks or significant murmurs.  The abdomen is soft.  There is no peripheral edema.  There are no skin rashes.  There are no musculoskeletal deformities. Filed Vitals:   05/14/11 1112  BP: 140/70  Pulse: 50  Height: 6' (1.829 m)  Weight: 171 lb (77.565 kg)    EKG is done today and reviewed by me.  There is normal pacing function. ASSESSMENT & PLAN

## 2011-05-14 NOTE — Assessment & Plan Note (Signed)
Mitral regurgitation was mild in October, 2011.  No further workup.

## 2011-05-14 NOTE — Assessment & Plan Note (Signed)
There is only mild aortic valvular disease.  No further workup.  I'll see him back for cardiology followup in one year.

## 2011-05-16 ENCOUNTER — Telehealth: Payer: Self-pay | Admitting: Pulmonary Disease

## 2011-05-16 NOTE — Telephone Encounter (Signed)
Noted and will forward to SN so he is aware.

## 2011-06-04 ENCOUNTER — Ambulatory Visit (INDEPENDENT_AMBULATORY_CARE_PROVIDER_SITE_OTHER): Payer: BC Managed Care – PPO

## 2011-06-04 DIAGNOSIS — D649 Anemia, unspecified: Secondary | ICD-10-CM

## 2011-06-06 DIAGNOSIS — D649 Anemia, unspecified: Secondary | ICD-10-CM

## 2011-06-06 MED ORDER — CYANOCOBALAMIN 1000 MCG/ML IJ SOLN
1000.0000 ug | Freq: Once | INTRAMUSCULAR | Status: AC
  Administered 2011-06-06: 1000 ug via INTRAMUSCULAR

## 2011-06-21 ENCOUNTER — Encounter: Payer: BC Managed Care – PPO | Admitting: *Deleted

## 2011-06-27 ENCOUNTER — Encounter: Payer: Self-pay | Admitting: *Deleted

## 2011-06-29 ENCOUNTER — Other Ambulatory Visit: Payer: Self-pay | Admitting: Internal Medicine

## 2011-06-29 ENCOUNTER — Encounter: Payer: Self-pay | Admitting: Internal Medicine

## 2011-06-29 ENCOUNTER — Ambulatory Visit (INDEPENDENT_AMBULATORY_CARE_PROVIDER_SITE_OTHER): Payer: BC Managed Care – PPO | Admitting: *Deleted

## 2011-06-29 DIAGNOSIS — I428 Other cardiomyopathies: Secondary | ICD-10-CM

## 2011-06-29 DIAGNOSIS — R55 Syncope and collapse: Secondary | ICD-10-CM

## 2011-06-29 LAB — REMOTE ICD DEVICE
AL IMPEDENCE ICD: 430 Ohm
BAMS-0003: 70 {beats}/min
BRDY-0002RV: 50 {beats}/min
BRDY-0003RV: 120 {beats}/min
BRDY-0004RV: 120 {beats}/min
RV LEAD IMPEDENCE ICD: 340 Ohm
RV LEAD THRESHOLD: 0.75 V
TZAT-0012SLOWVT: 200 ms
TZAT-0013SLOWVT: 4
TZAT-0018SLOWVT: NEGATIVE
TZAT-0019SLOWVT: 7.5 V
TZAT-0020SLOWVT: 1 ms
TZON-0003SLOWVT: 375 ms
TZON-0004SLOWVT: 12
TZON-0005SLOWVT: 6
TZST-0001SLOWVT: 2
TZST-0001SLOWVT: 4
TZST-0003SLOWVT: 36 J
VENTRICULAR PACING ICD: 98 pct

## 2011-07-03 NOTE — Progress Notes (Signed)
Remote icd check  

## 2011-07-06 ENCOUNTER — Ambulatory Visit (INDEPENDENT_AMBULATORY_CARE_PROVIDER_SITE_OTHER): Payer: BC Managed Care – PPO

## 2011-07-06 DIAGNOSIS — D649 Anemia, unspecified: Secondary | ICD-10-CM

## 2011-07-09 DIAGNOSIS — D649 Anemia, unspecified: Secondary | ICD-10-CM

## 2011-07-09 MED ORDER — CYANOCOBALAMIN 1000 MCG/ML IJ SOLN
1000.0000 ug | Freq: Once | INTRAMUSCULAR | Status: AC
  Administered 2011-07-09: 1000 ug via INTRAMUSCULAR

## 2011-07-11 ENCOUNTER — Encounter: Payer: Self-pay | Admitting: *Deleted

## 2011-08-06 ENCOUNTER — Ambulatory Visit (INDEPENDENT_AMBULATORY_CARE_PROVIDER_SITE_OTHER): Payer: BC Managed Care – PPO

## 2011-08-06 DIAGNOSIS — D649 Anemia, unspecified: Secondary | ICD-10-CM

## 2011-08-06 MED ORDER — CYANOCOBALAMIN 1000 MCG/ML IJ SOLN
1000.0000 ug | Freq: Once | INTRAMUSCULAR | Status: AC
  Administered 2011-08-06: 1000 ug via INTRAMUSCULAR

## 2011-08-28 ENCOUNTER — Telehealth: Payer: Self-pay | Admitting: Pulmonary Disease

## 2011-08-28 DIAGNOSIS — R55 Syncope and collapse: Secondary | ICD-10-CM

## 2011-08-28 DIAGNOSIS — D649 Anemia, unspecified: Secondary | ICD-10-CM

## 2011-08-28 DIAGNOSIS — I10 Essential (primary) hypertension: Secondary | ICD-10-CM

## 2011-08-28 DIAGNOSIS — F419 Anxiety disorder, unspecified: Secondary | ICD-10-CM

## 2011-08-28 DIAGNOSIS — E785 Hyperlipidemia, unspecified: Secondary | ICD-10-CM

## 2011-08-28 NOTE — Telephone Encounter (Signed)
Attempted to call pts daughter at number given but this line does not work.  Orders for labs have been placed in the computer and will keep this message to see if pts daughter calls back.

## 2011-08-28 NOTE — Telephone Encounter (Signed)
I spoke with pt and he is wanting to come in on Thursday to have his labs drawn for his apt 09/07/11. Please advise Dr. Kriste Basque, thanks

## 2011-08-29 NOTE — Telephone Encounter (Signed)
lmomtcb x1 for pt on home #. The # listed above is incorrect

## 2011-08-29 NOTE — Telephone Encounter (Signed)
Pt's daughter returned call. Per leslie I advised pt that labs are in computer. Nothing further needed per pt's daughter. I will close this encounter. Hazel Sams

## 2011-08-30 ENCOUNTER — Other Ambulatory Visit (INDEPENDENT_AMBULATORY_CARE_PROVIDER_SITE_OTHER): Payer: BC Managed Care – PPO

## 2011-08-30 DIAGNOSIS — D649 Anemia, unspecified: Secondary | ICD-10-CM | POA: Diagnosis not present

## 2011-08-30 DIAGNOSIS — I10 Essential (primary) hypertension: Secondary | ICD-10-CM | POA: Diagnosis not present

## 2011-08-30 DIAGNOSIS — F411 Generalized anxiety disorder: Secondary | ICD-10-CM | POA: Diagnosis not present

## 2011-08-30 DIAGNOSIS — E785 Hyperlipidemia, unspecified: Secondary | ICD-10-CM

## 2011-08-30 DIAGNOSIS — N139 Obstructive and reflux uropathy, unspecified: Secondary | ICD-10-CM

## 2011-08-30 DIAGNOSIS — F419 Anxiety disorder, unspecified: Secondary | ICD-10-CM

## 2011-08-30 LAB — CBC WITH DIFFERENTIAL/PLATELET
Basophils Absolute: 0 10*3/uL (ref 0.0–0.1)
Basophils Relative: 0.7 % (ref 0.0–3.0)
Eosinophils Absolute: 0.6 10*3/uL (ref 0.0–0.7)
Hemoglobin: 12.6 g/dL — ABNORMAL LOW (ref 13.0–17.0)
Lymphocytes Relative: 27.5 % (ref 12.0–46.0)
MCHC: 34.2 g/dL (ref 30.0–36.0)
MCV: 94.4 fl (ref 78.0–100.0)
Monocytes Absolute: 0.5 10*3/uL (ref 0.1–1.0)
Neutro Abs: 2.4 10*3/uL (ref 1.4–7.7)
RBC: 3.9 Mil/uL — ABNORMAL LOW (ref 4.22–5.81)
RDW: 12.7 % (ref 11.5–14.6)

## 2011-08-30 LAB — HEPATIC FUNCTION PANEL
ALT: 18 U/L (ref 0–53)
Alkaline Phosphatase: 54 U/L (ref 39–117)
Bilirubin, Direct: 0.2 mg/dL (ref 0.0–0.3)
Total Bilirubin: 1.7 mg/dL — ABNORMAL HIGH (ref 0.3–1.2)
Total Protein: 6.7 g/dL (ref 6.0–8.3)

## 2011-08-30 LAB — BASIC METABOLIC PANEL
CO2: 28 mEq/L (ref 19–32)
Calcium: 9.3 mg/dL (ref 8.4–10.5)
Chloride: 107 mEq/L (ref 96–112)
Creatinine, Ser: 0.9 mg/dL (ref 0.4–1.5)
Sodium: 141 mEq/L (ref 135–145)

## 2011-08-30 LAB — LIPID PANEL
LDL Cholesterol: 100 mg/dL — ABNORMAL HIGH (ref 0–99)
Total CHOL/HDL Ratio: 4

## 2011-09-04 ENCOUNTER — Encounter: Payer: Self-pay | Admitting: Internal Medicine

## 2011-09-07 ENCOUNTER — Ambulatory Visit (INDEPENDENT_AMBULATORY_CARE_PROVIDER_SITE_OTHER): Payer: BC Managed Care – PPO | Admitting: Pulmonary Disease

## 2011-09-07 DIAGNOSIS — I2510 Atherosclerotic heart disease of native coronary artery without angina pectoris: Secondary | ICD-10-CM | POA: Diagnosis not present

## 2011-09-07 DIAGNOSIS — I428 Other cardiomyopathies: Secondary | ICD-10-CM | POA: Diagnosis not present

## 2011-09-07 DIAGNOSIS — I10 Essential (primary) hypertension: Secondary | ICD-10-CM | POA: Diagnosis not present

## 2011-09-07 DIAGNOSIS — M1990 Unspecified osteoarthritis, unspecified site: Secondary | ICD-10-CM

## 2011-09-07 DIAGNOSIS — E785 Hyperlipidemia, unspecified: Secondary | ICD-10-CM

## 2011-09-07 DIAGNOSIS — I359 Nonrheumatic aortic valve disorder, unspecified: Secondary | ICD-10-CM

## 2011-09-07 DIAGNOSIS — Z95810 Presence of automatic (implantable) cardiac defibrillator: Secondary | ICD-10-CM

## 2011-09-07 DIAGNOSIS — I059 Rheumatic mitral valve disease, unspecified: Secondary | ICD-10-CM

## 2011-09-07 DIAGNOSIS — I340 Nonrheumatic mitral (valve) insufficiency: Secondary | ICD-10-CM

## 2011-09-07 DIAGNOSIS — E538 Deficiency of other specified B group vitamins: Secondary | ICD-10-CM

## 2011-09-07 DIAGNOSIS — I350 Nonrheumatic aortic (valve) stenosis: Secondary | ICD-10-CM

## 2011-09-07 MED ORDER — CLOPIDOGREL BISULFATE 75 MG PO TABS: 75.0000 mg | ORAL_TABLET | Freq: Every day | ORAL | Status: DC

## 2011-09-07 MED ORDER — SIMVASTATIN 80 MG PO TABS: ORAL_TABLET | ORAL | Status: DC

## 2011-09-07 MED ORDER — GEMFIBROZIL 600 MG PO TABS: 600.0000 mg | ORAL_TABLET | Freq: Every day | ORAL | Status: DC

## 2011-09-07 MED ORDER — RAMIPRIL 10 MG PO CAPS: 10.0000 mg | ORAL_CAPSULE | Freq: Every day | ORAL | Status: DC

## 2011-09-07 MED ORDER — CYANOCOBALAMIN 1000 MCG/ML IJ SOLN
1000.0000 ug | Freq: Once | INTRAMUSCULAR | Status: AC
  Administered 2011-09-07: 1000 ug via INTRAMUSCULAR

## 2011-09-07 MED ORDER — METOPROLOL SUCCINATE ER 100 MG PO TB24: 100.0000 mg | ORAL_TABLET | Freq: Every day | ORAL | Status: DC

## 2011-09-07 NOTE — Patient Instructions (Signed)
Today we updated your med list in our EPIC system...    Continue your current medications the same...  We refilled your meds per request...  We gave you a copy of your recent blood work...  Call for anyt questions...  Let's plan a follow up visit in 6 months.Marland KitchenMarland Kitchen

## 2011-09-07 NOTE — Progress Notes (Signed)
Subjective:    Patient ID: [name], male    DOB: [birth year], 78 y.o.   MRN: [id number]  HPI 78 y/o WM here for a follow up visit & review of mult medical problems... 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  ~  August 30, 2009:  he continues to do well- no new complaints or concerns... BP controlled on meds; he saw DrKatz for f/u 10/10- doing well, no CP, mild MR, ICD in place, no change in Rx... Chol controlled on Simva + Gemfib + Fish Oil; still getting B12 shots monthly; has Flu shot 10/10 & due for PNEUMOVAX now.  ~  March 08, 2010:  feels well- no new complaints or concerns... he had ICD generator changed by DrTaylor 6/11- doing well post op... as noted he has CAD, ischemic cardiomyopathy, CHB w/ pacer/ ICD, he has class I-II symptoms and still works daily at [employer]... we discussed recheck FLP & we cut his Simva80 to 1/2 tab Qhs.  ~  September 05, 2010:  another good 68mo- now working three 12H shifts per week & denies CP, palpit, SOB, etc... he tells me he also walks regularly & notes that he's good for about 1/2 mile then his jaw starts to hurt & he has to stop... he saw DrTaylor 9/11- OK w/ new generator for his AICD;  he saw [facility] 10/11 & f/u 2DEcho showed mild LVH, decr LVF w/ EF= 35-40% & mild HK, gr1DD, mild AS & MR, mild LAdil ==> no change in meds... BP controlled, Chol looks good on 1/2 tab, he still gets monthly B12 shots...   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  ~  September 07, 2011:  68mo ROV & he reports a good interval w/o new complaints or  concerns;  Requests B12 shot today & refill all meds for 90d supplies...    BP remains controlled on Metoprolol & Ramipril;  Cardiac stable on ASA/ Plavix & he denies CP, palpit, dizzy, syncope, SOB, or edema;  Followed by Delton See for Cards (seen 10/12 doing satis, no changes made) & DrTaylor for EP- AICD is functioning normally as of last check;  FLP looks good on his Simva40, Lopid600, & FishOil;  CBC is wnl on his B12 shots monthly...          Problem List:  HYPERTENSION (ICD-401.9) - controlled on TOPROL XL 100mg /d & ALTACE 10mg /d... takes meds regularly and tolerates well...  ~  7/12: BP= 120/68 and OK at home per pt; he gets rare HA which he believes is related to his BP; denies fatigue, visual changes, CP, palipit, dizziness, syncope, dyspnea, edema, etc... ~  CXR 9/12 showed dual lead pacer/ AICD, norm heart size, clear lungs, NAD... ~  2/13: BP= 124/68 & he remains asymptomatic...  CAD (ICD-414.00) - on ASA 81mg /d & PLAVIX 75mg /d> followed by Delton See... ISCHEMIC CARDIOMYOPATHY (ICD-414.8) - he's had prev MI w/ mult caths/ interventions. CHRONIC SYSTOLIC HEART FAILURE (ICD-428.22) AORTIC STENOSIS (ICD-424.1) > Mild by  2DEcho 10/11 MITRAL REGURGITATION (ICD-396.3) ~  last cath 5/08 showed norm Lmain, patent stent in prox LAD, 80-90% lesion in mid-LAD w/ PCI & stent placed, & 50% ostial stenosis of 1st diagonal branch of LAD; 40%mid & 70%distal CIRC; 50%prox 40%mid 40%distal RCA; Global HK w/EF=30%. ~  NuclearStressTest 4/09 showed infer scar, no ischemia, EF=40%. ~  2DEcho 10/11 showed mild LVH, decr LVF w/ EF= 35-40% & mild HK, gr1DD, mild AS & MR, mild LAdil. ~  EKG 10/12 showed pacer rhythm...  Hx of SYNCOPE (ICD-780.2) AV BLOCK, COMPLETE (ICD-426.0) AUTOMATIC IMPLANTABLE CARDIAC DEFIBRILLATOR SITU (ICD-V45.02) ~  Pacer/ ICD per DrTaylor w/ several adjustments in 2008... ~  5/11:  f/u DrTaylor w/ decision for elective generator change> done 6/11 & tol well. ~  He continues to f/u w/  DrTaylor every 6 months...  HYPERCHOLESTEROLEMIA (ICD-272.0) - takes SIMVASTATIN 80mg - 1/2 daily, GEMFIBRIZOL 600mg /d, & FISH OIL Bid... ~  FLP 08/14/07 on Simva40 showed TChol 163, TG 94, HDL 34, LDL 111... rec incr Simvastatin to 80mg /d. ~  FLP 02/24/08 on Simva80 showed TChol 136, TG 92, HDL 38, LDL 80... continue same. ~  FLP 1/10 showed TChol 125, TG 67, HDL 32, LDL 80... rec- continue same. ~  FLP 7/10 showed TChol 124, TG 86, HDL 36, LDL 71 ~  FLP 1/11 on Simva80+Gemfib600 showed TChol 137, TG 99, HDL 39, LDL 78 ~  FLP 8/11 on Simva80+Gemfib600 showed TChol 134, TG 82, HDL 36, LDL 82... Simva decr to 40. ~  FLP 1/12 on Simva40+Gemfib600 showed TChol 157, TG 139, HDL 38, LDL 92 ~  FLP 7/12 on Simva40+Gemfib600 showed TChol 163, TG 202, HDL 43, LDL 96... Needs better low fat diet. ~  FLP 1/13 on Simva40+Gemfib600 showed TChol 149, TG 50, HDL 39, LDL 100  GERD (ICD-530.81) - he had ERCP in 8/06 w/ norm ducts, +gallstones, s/p lap chole 2006... he uses OTC meds Prn... ~  he notes that he had a colonoscopy in the past from DrPatterson, & he reports that it was neg.  DEGENERATIVE JOINT DISEASE (ICD-715.90) - he tried Mobic but it bothered his stomach, therefore uses Tylenol Prn.  VITAMIN B12 DEFICIENCY (ICD-266.2) - old chart not available- ? details of this Dx... on B12 shots xyrs. ~  labs 1/09 showed Hg=12.9 ~  labs 1/10 showed Hg= 12.7 ~  labs 1/11 showed Hg= 12.6 ~  labs 1/12 showed Hg= 13.2 ~  Labs 1/13 showed Hg= 12.6  Health Maintenance: ~  GI:  Followed by DrPatterson ~  GU:  PSA checked yearly & remains wnl... ~  Immunizations:  he gets the yearly Flu vaccine;  he [place], he says;  given Pneumovax 1/11 age 42.   Past Surgical History  Procedure Date  . Icd implanted 2005  . Laparoscopic cholecystectomy 04/2005    Dr. Ezzard Standing  . Cardiac catheterization 2008    Outpatient Encounter Prescriptions as of 09/07/2011  Medication Sig Dispense Refill   . aspirin 81 MG tablet Take 81 mg by mouth daily.        . clopidogrel (PLAVIX) 75 MG tablet Take 75 mg by mouth daily.        . cyanocobalamin (,VITAMIN B-12,) 1000 MCG/ML injection Inject 1,000 mcg into the muscle every 30 (thirty) days.        . fish oil-omega-3 fatty acids 1000 MG capsule Take 2 g by mouth daily.        Marland Kitchen gemfibrozil (LOPID) 600 MG  tablet Take 600 mg by mouth daily.        . metoprolol (TOPROL-XL) 100 MG 24 hr tablet Take 100 mg by mouth daily.        . Multiple Vitamins-Minerals (MENS MULTIVITAMIN PLUS PO) Take 1 tablet by mouth daily.        . ramipril (ALTACE) 10 MG capsule Take 10 mg by mouth daily.        . simvastatin (ZOCOR) 80 MG tablet Take 1 tablet (80 mg total) by mouth every evening.  90 tablet  4    No Known Allergies   Current Medications, Allergies, Past Medical History, Past Surgical History, Family History, and Social History were reviewed in [employer] record.    Review of Systems        See HPI - all other systems neg except as noted...  The patient complains of dyspnea on exertion.  The patient denies anorexia, fever, weight loss, weight gain, vision loss, decreased hearing, hoarseness, chest pain, syncope, peripheral edema, prolonged cough, headaches, hemoptysis, abdominal pain, melena, hematochezia, severe indigestion/heartburn, hematuria, incontinence, muscle weakness, suspicious skin lesions, transient blindness, difficulty walking, depression, unusual weight change, abnormal bleeding, enlarged lymph nodes, and angioedema.     Objective:   Physical Exam     WD, WN, 78 y/o WM in NAD... wt=170# ht=6'1" BMI=22... GENERAL:  Alert & oriented; pleasant & cooperative... HEENT:  [place]/AT, EOM-wnl, PERRLA, EACs-clear, TMs-wnl, NOSE-clear, THROAT-clear & wnl. NECK:  Supple w/ fairROM; no JVD; normal carotid impulses w/o bruits; no thyromegaly or nodules palpated; no lymphadenopathy. CHEST:  Clear to P & A; without wheezes/ rales/  or rhonchi heard... HEART:  Pacer in L upper chest area~ nontender, RR, gr 1/6 SEM without rubs or gallops detected... ABDOMEN:  Soft & nontender; normal bowel sounds; no organomegaly or masses palpated... EXT: without deformities, mild arthritic changes; no varicose veins/ venous insuffic/ or edema. NEURO:  CN's intact; no focal neuro deficits apprec... DERM:  No lesions noted; no rash etc...  RADIOLOGY DATA:  Reviewed in the EPIC EMR & discussed w/ the patient...    >>Last CXR 9/12 showed dual lead pacer/ AICD, norm heart size, clear lungs, NAD...    >>Last EKG 10/12 showed pacer rhythm...    >>Last 2DEcho 10/11 showed mild LVH, decr LVF w/ EF= 35-40% & mild HK, gr1DD, mild AS & MR, mild LAdil.  LABORATORY DATA:  Reviewed in the EPIC EMR & discussed w/ the patient...    >>LABS 1/13 showed FLP- ok on meds;  Chems- wnl;  CBC- wnl;  TSH- wnl;  PSA- wnl...   Assessment & Plan:   HBP>  Controlled on Metoprolol & Ramipril, continue same...  CAD, Cardiomyopathy, CHF, ValvHeartDis w/ AS & MR>  Followed by Delton See & stable on ASA/ Plavix, still working 3d per week etc...  Hx syncope, AVBlock, Pacer/AICD>  Followed by DrTaylor & stable, continue same...  LIPIDS>  FLP looks good on Simva40, Genfib 600mg /d, & Fish Oil...  DJD>  He uses Tylenol as needed; still working 3d per week...  B-12 defic>  He remains on B12 shots monthly...   Patient's Medications  New Prescriptions   No medications on file  Previous Medications   ASPIRIN 81 MG TABLET    Take 81 mg by mouth daily.     CYANOCOBALAMIN (,VITAMIN B-12,) 1000 MCG/ML INJECTION    Inject 1,000 mcg into the muscle every 30 (thirty) days.     FISH OIL-OMEGA-3 FATTY ACIDS 1000 MG CAPSULE  Take 2 g by mouth daily.     MULTIPLE VITAMINS-MINERALS (MENS MULTIVITAMIN PLUS PO)    Take 1 tablet by mouth daily.    Modified Medications   Modified Medication Previous Medication   CLOPIDOGREL (PLAVIX) 75 MG TABLET clopidogrel (PLAVIX) 75 MG tablet        Take 1 tablet (75 mg total) by mouth daily.    Take 75 mg by mouth daily.     GEMFIBROZIL (LOPID) 600 MG TABLET gemfibrozil (LOPID) 600 MG tablet      Take 1 tablet (600 mg total) by mouth daily.    Take 600 mg by mouth daily.     METOPROLOL SUCCINATE (TOPROL-XL) 100 MG 24 HR TABLET metoprolol (TOPROL-XL) 100 MG 24 hr tablet      Take 1 tablet (100 mg total) by mouth daily.    Take 100 mg by mouth daily.     RAMIPRIL (ALTACE) 10 MG CAPSULE ramipril (ALTACE) 10 MG capsule      Take 1 capsule (10 mg total) by mouth daily.    Take 10 mg by mouth daily.     SIMVASTATIN (ZOCOR) 80 MG TABLET simvastatin (ZOCOR) 80 MG tablet      Take as directed    Take 1 tablet (80 mg total) by mouth every evening.  Discontinued Medications   No medications on file

## 2011-09-10 ENCOUNTER — Encounter: Payer: Self-pay | Admitting: Internal Medicine

## 2011-09-10 ENCOUNTER — Encounter: Payer: Self-pay | Admitting: Pulmonary Disease

## 2011-09-10 ENCOUNTER — Telehealth: Payer: Self-pay | Admitting: Pulmonary Disease

## 2011-09-10 ENCOUNTER — Ambulatory Visit (INDEPENDENT_AMBULATORY_CARE_PROVIDER_SITE_OTHER): Payer: BC Managed Care – PPO | Admitting: Internal Medicine

## 2011-09-10 DIAGNOSIS — Z95810 Presence of automatic (implantable) cardiac defibrillator: Secondary | ICD-10-CM

## 2011-09-10 DIAGNOSIS — I5022 Chronic systolic (congestive) heart failure: Secondary | ICD-10-CM | POA: Diagnosis not present

## 2011-09-10 DIAGNOSIS — I10 Essential (primary) hypertension: Secondary | ICD-10-CM

## 2011-09-10 DIAGNOSIS — I428 Other cardiomyopathies: Secondary | ICD-10-CM

## 2011-09-10 LAB — ICD DEVICE OBSERVATION
AL AMPLITUDE: 1.6 mv
AL IMPEDENCE ICD: 400 Ohm
ATRIAL PACING ICD: 38 pct
BAMS-0001: 150 {beats}/min
HV IMPEDENCE: 38 Ohm
RV LEAD IMPEDENCE ICD: 340 Ohm
TZAT-0001SLOWVT: 1
TZAT-0004SLOWVT: 8
TZAT-0018SLOWVT: NEGATIVE
TZAT-0019SLOWVT: 7.5 V
TZAT-0020SLOWVT: 1 ms
TZON-0003SLOWVT: 375 ms
TZON-0004SLOWVT: 12
TZON-0005SLOWVT: 6
TZON-0010SLOWVT: 40 ms
TZST-0001SLOWVT: 3
TZST-0001SLOWVT: 4
TZST-0003SLOWVT: 30 J
VENTRICULAR PACING ICD: 98 pct

## 2011-09-10 MED ORDER — SIMVASTATIN 40 MG PO TABS: 40.0000 mg | ORAL_TABLET | Freq: Every evening | ORAL | Status: DC

## 2011-09-10 NOTE — Telephone Encounter (Signed)
LMOMTCB x 1 so pt can be informed of dosage change in simvastatin.  New RX sent to [facility].

## 2011-09-10 NOTE — Assessment & Plan Note (Signed)
His blood pressure remains well controlled. He will continue his current medical therapy and maintain a low-sodium diet. 

## 2011-09-10 NOTE — Telephone Encounter (Signed)
I spoke with [facility] (gary marks) and was advised they received a refill on pt's simvastatn 80 mg in November 01, 2010 and this was last time this was filled. Pt never refilled this since so he has not been taking it. He stated FDA has new guidelines and it stated that simvastatin 80 mg should not be prescribed if pt has not been on a lower dose of simvastatin for at least a year. He states they recommend this be changed to something different bc this can cause pt to have severe side effects. Please advise Dr. Kriste Basque, thanks

## 2011-09-10 NOTE — Patient Instructions (Signed)
Remote monitoring is used to monitor your Pacemaker of ICD from home. This monitoring reduces the number of office visits required to check your device to one time per year. It allows [place] to keep an eye on the functioning of your device to ensure it is working properly. You are scheduled for a device check from home on Dec 13, 2011. You may send your transmission at any time that day. If you have a wireless device, the transmission will be sent automatically. After your physician reviews your transmission, you will receive a postcard with your next transmission date.  Your physician wants you to follow-up in: 1 year with Dr Ladona Ridgel.  You will receive a reminder letter in the mail two months in advance. If you don't receive a letter, please call our office to schedule the follow-up appointment.

## 2011-09-10 NOTE — Progress Notes (Signed)
HPI [name] returns today for followup. He is a very pleasant 78 year old man with an ischemic cardiomyopathy, high-grade heart block, and class I to class II heart failure symptoms. He underwent ICD generator change over a year ago. At that time a biventricular ICD was inserted without insertion of a left ventricular lead. Since then he continues to do well. He denies chest pain, shortness of breath, peripheral edema, syncope, or any other cardiac symptoms. He remains active. No Known Allergies   Current Outpatient Prescriptions  Medication Sig Dispense Refill  . aspirin 81 MG tablet Take 81 mg by mouth daily.        . clopidogrel (PLAVIX) 75 MG tablet Take 1 tablet (75 mg total) by mouth daily.  90 tablet  4  . cyanocobalamin (,VITAMIN B-12,) 1000 MCG/ML injection Inject 1,000 mcg into the muscle every 30 (thirty) days.        . fish oil-omega-3 fatty acids 1000 MG capsule Take 2 g by mouth daily.        Marland Kitchen gemfibrozil (LOPID) 600 MG tablet Take 1 tablet (600 mg total) by mouth daily.  90 tablet  4  . metoprolol succinate (TOPROL-XL) 100 MG 24 hr tablet Take 1 tablet (100 mg total) by mouth daily.  90 tablet  4  . Multiple Vitamins-Minerals (MENS MULTIVITAMIN PLUS PO) Take 1 tablet by mouth daily.        . ramipril (ALTACE) 10 MG capsule Take 1 capsule (10 mg total) by mouth daily.  90 capsule  4  . simvastatin (ZOCOR) 40 MG tablet Take 1 tablet (40 mg total) by mouth every evening.  90 tablet  3     Past Medical History  Diagnosis Date  . Hypertension   . CAD (coronary artery disease)     DES to LAD 2005 /  DES to LAD 2008  . LBBB (left bundle branch block)   . Cardiomyopathy     Ischemic, EF 30%, 2005  . Mitral regurgitation     Mild, echo, October, 2011  . Aortic stenosis     Mild, echo, October, 2011  . AV block, complete     Permanent pacemaker  . ICD (implantable cardiac defibrillator), single, in situ     for syncope and arrhythmia 2005 / generator change June, 2011  .  Dyslipidemia   . GERD (gastroesophageal reflux disease)   . DJD (degenerative joint disease)   . Anemia, unspecified   . Vitamin B12 deficiency   . Ejection fraction < 50%     EF 30%, echo, 2005  /  EF 30%, catheterization, 2008  . Syncope     2005, ICD placed  . Systolic CHF, chronic   . Rectal fissure   . Hyperlipidemia     ROS:   All systems reviewed and negative except as noted in the HPI.   Past Surgical History  Procedure Date  . Icd implanted 2005  . Laparoscopic cholecystectomy 04/2005    Dr. Ezzard Standing  . Cardiac catheterization 2008     Family History  Problem Relation Age of Onset  . Stroke Brother   . Lung cancer Brother   . Stroke Mother   . Cancer Mother 37  . Stroke Father 7     History   Social History  . Marital Status: Widowed    Spouse Name: N/A    Number of Children: 2  . Years of Education: N/A   Occupational History  . repairs looms at [employer]  Social History Main Topics  . Smoking status: Former Smoker    Types: Cigarettes    Quit date: 08/06/1952  . Smokeless tobacco: Not on file  . Alcohol Use: No  . Drug Use: No  . Sexually Active: Not on file   Other Topics Concern  . Not on file   Social History Narrative  . No narrative on file     BP 114/62  Pulse 50  Ht 6' (1.829 m)  Wt 78.472 kg (173 lb)  BMI 23.46 kg/m2  Physical Exam:  Well appearing 78 year old man, NAD HEENT: Unremarkable Neck:  No JVD, no thyromegally Lymphatics:  No adenopathy Back:  No CVA tenderness Lungs:  Clear with no wheezes, rales, or rhonchi. Well-healed ICD incision. HEART:  Regular rate rhythm, no murmurs, no rubs, no clicks Abd:  soft, positive bowel sounds, no organomegally, no rebound, no guarding Ext:  2 plus pulses, no edema, no cyanosis, no clubbing Skin:  No rashes no nodules Neuro:  CN II through XII intact, motor grossly intact DEVICE  Normal device function.  See PaceArt for details.   Assess/Plan:

## 2011-09-10 NOTE — Assessment & Plan Note (Signed)
His symptoms are currently class 1-2. He will continue his medical therapy, and maintain a low-sodium diet.

## 2011-09-10 NOTE — Telephone Encounter (Signed)
Per SN---[facility] will not allow simvastatin 80mg  dose anymore----we can send in rx for the simvastatin 40mg   1 daily at bedtime.  #90 with 3 refills.  thanks

## 2011-09-10 NOTE — Assessment & Plan Note (Signed)
His device is working normally. We'll plan to recheck in several months. 

## 2011-09-11 NOTE — Telephone Encounter (Signed)
lmomtcb x1 

## 2011-09-11 NOTE — Telephone Encounter (Signed)
Patient aware simvastatin dose has been changed to 40mg  which is covered by his insurance and verbalized understanding and had no questions.

## 2011-10-05 ENCOUNTER — Ambulatory Visit (INDEPENDENT_AMBULATORY_CARE_PROVIDER_SITE_OTHER): Payer: BC Managed Care – PPO

## 2011-10-05 DIAGNOSIS — D649 Anemia, unspecified: Secondary | ICD-10-CM | POA: Diagnosis not present

## 2011-10-05 MED ORDER — CYANOCOBALAMIN 1000 MCG/ML IJ SOLN
1000.0000 ug | Freq: Once | INTRAMUSCULAR | Status: AC
  Administered 2011-10-05: 1000 ug via INTRAMUSCULAR

## 2011-11-05 ENCOUNTER — Ambulatory Visit (INDEPENDENT_AMBULATORY_CARE_PROVIDER_SITE_OTHER): Payer: BC Managed Care – PPO

## 2011-11-05 DIAGNOSIS — D649 Anemia, unspecified: Secondary | ICD-10-CM

## 2011-11-08 DIAGNOSIS — D649 Anemia, unspecified: Secondary | ICD-10-CM | POA: Diagnosis not present

## 2011-11-08 MED ORDER — CYANOCOBALAMIN 1000 MCG/ML IJ SOLN
1000.0000 ug | Freq: Once | INTRAMUSCULAR | Status: AC
  Administered 2011-11-08: 1000 ug via INTRAMUSCULAR

## 2011-12-05 ENCOUNTER — Ambulatory Visit (INDEPENDENT_AMBULATORY_CARE_PROVIDER_SITE_OTHER): Payer: BC Managed Care – PPO

## 2011-12-05 DIAGNOSIS — D649 Anemia, unspecified: Secondary | ICD-10-CM | POA: Diagnosis not present

## 2011-12-05 MED ORDER — CYANOCOBALAMIN 1000 MCG/ML IJ SOLN
1000.0000 ug | Freq: Once | INTRAMUSCULAR | Status: AC
  Administered 2011-12-05: 1000 ug via INTRAMUSCULAR

## 2011-12-13 ENCOUNTER — Ambulatory Visit (INDEPENDENT_AMBULATORY_CARE_PROVIDER_SITE_OTHER): Payer: BC Managed Care – PPO | Admitting: *Deleted

## 2011-12-13 ENCOUNTER — Encounter: Payer: Self-pay | Admitting: Internal Medicine

## 2011-12-13 DIAGNOSIS — I5022 Chronic systolic (congestive) heart failure: Secondary | ICD-10-CM | POA: Diagnosis not present

## 2011-12-13 DIAGNOSIS — I428 Other cardiomyopathies: Secondary | ICD-10-CM

## 2011-12-13 LAB — REMOTE ICD DEVICE
BAMS-0003: 70 {beats}/min
BRDY-0002RV: 50 {beats}/min
BRDY-0003RV: 120 {beats}/min
BRDY-0004RV: 120 {beats}/min
DEVICE MODEL ICD: 610170
HV IMPEDENCE: 42 Ohm
RV LEAD IMPEDENCE ICD: 380 Ohm
RV LEAD THRESHOLD: 0.75 V
TZAT-0012SLOWVT: 200 ms
TZAT-0013SLOWVT: 4
TZAT-0018SLOWVT: NEGATIVE
TZAT-0020SLOWVT: 1 ms
TZON-0003SLOWVT: 375 ms
TZON-0005SLOWVT: 6
TZST-0001SLOWVT: 2
TZST-0001SLOWVT: 4
TZST-0001SLOWVT: 5
TZST-0003SLOWVT: 36 J

## 2011-12-21 ENCOUNTER — Encounter: Payer: Self-pay | Admitting: *Deleted

## 2011-12-26 DIAGNOSIS — M25579 Pain in unspecified ankle and joints of unspecified foot: Secondary | ICD-10-CM | POA: Diagnosis not present

## 2011-12-28 NOTE — Progress Notes (Signed)
ICD remote with ICM 

## 2012-01-07 ENCOUNTER — Ambulatory Visit (INDEPENDENT_AMBULATORY_CARE_PROVIDER_SITE_OTHER): Payer: BC Managed Care – PPO

## 2012-01-07 DIAGNOSIS — D649 Anemia, unspecified: Secondary | ICD-10-CM | POA: Diagnosis not present

## 2012-01-07 MED ORDER — CYANOCOBALAMIN 1000 MCG/ML IJ SOLN
1000.0000 ug | Freq: Once | INTRAMUSCULAR | Status: AC
  Administered 2012-01-07: 1000 ug via INTRAMUSCULAR

## 2012-02-04 ENCOUNTER — Ambulatory Visit (INDEPENDENT_AMBULATORY_CARE_PROVIDER_SITE_OTHER): Payer: BC Managed Care – PPO

## 2012-02-04 DIAGNOSIS — D589 Hereditary hemolytic anemia, unspecified: Secondary | ICD-10-CM

## 2012-02-05 MED ORDER — CYANOCOBALAMIN 1000 MCG/ML IJ SOLN
1000.0000 ug | Freq: Once | INTRAMUSCULAR | Status: AC
  Administered 2012-02-05: 1000 ug via INTRAMUSCULAR

## 2012-02-18 DIAGNOSIS — M722 Plantar fascial fibromatosis: Secondary | ICD-10-CM | POA: Diagnosis not present

## 2012-02-26 ENCOUNTER — Telehealth: Payer: Self-pay | Admitting: Pulmonary Disease

## 2012-02-26 DIAGNOSIS — I10 Essential (primary) hypertension: Secondary | ICD-10-CM

## 2012-02-26 DIAGNOSIS — E785 Hyperlipidemia, unspecified: Secondary | ICD-10-CM

## 2012-02-26 NOTE — Telephone Encounter (Signed)
Please advise what labs pt will need SN? thanks 

## 2012-02-26 NOTE — Telephone Encounter (Signed)
Per sn liver and FLP. Labs have been placed and pt is aware. Nothing further needed

## 2012-02-27 DIAGNOSIS — M722 Plantar fascial fibromatosis: Secondary | ICD-10-CM | POA: Diagnosis not present

## 2012-02-29 ENCOUNTER — Other Ambulatory Visit (INDEPENDENT_AMBULATORY_CARE_PROVIDER_SITE_OTHER): Payer: BC Managed Care – PPO

## 2012-02-29 DIAGNOSIS — E785 Hyperlipidemia, unspecified: Secondary | ICD-10-CM

## 2012-02-29 DIAGNOSIS — I10 Essential (primary) hypertension: Secondary | ICD-10-CM

## 2012-02-29 LAB — HEPATIC FUNCTION PANEL
ALT: 20 U/L (ref 0–53)
AST: 27 U/L (ref 0–37)
Albumin: 4.4 g/dL (ref 3.5–5.2)
Alkaline Phosphatase: 52 U/L (ref 39–117)
Bilirubin, Direct: 0.2 mg/dL (ref 0.0–0.3)
Total Protein: 7.6 g/dL (ref 6.0–8.3)

## 2012-02-29 LAB — LIPID PANEL
Cholesterol: 172 mg/dL (ref 0–200)
Triglycerides: 70 mg/dL (ref 0.0–149.0)

## 2012-03-06 ENCOUNTER — Encounter: Payer: Self-pay | Admitting: Pulmonary Disease

## 2012-03-06 ENCOUNTER — Ambulatory Visit (INDEPENDENT_AMBULATORY_CARE_PROVIDER_SITE_OTHER): Payer: BC Managed Care – PPO | Admitting: Pulmonary Disease

## 2012-03-06 VITALS — BP 122/58 | HR 50 | Temp 97.0°F | Ht 72.0 in | Wt 171.2 lb

## 2012-03-06 DIAGNOSIS — I5022 Chronic systolic (congestive) heart failure: Secondary | ICD-10-CM

## 2012-03-06 DIAGNOSIS — I2510 Atherosclerotic heart disease of native coronary artery without angina pectoris: Secondary | ICD-10-CM

## 2012-03-06 DIAGNOSIS — I10 Essential (primary) hypertension: Secondary | ICD-10-CM

## 2012-03-06 DIAGNOSIS — M1990 Unspecified osteoarthritis, unspecified site: Secondary | ICD-10-CM

## 2012-03-06 DIAGNOSIS — E538 Deficiency of other specified B group vitamins: Secondary | ICD-10-CM

## 2012-03-06 DIAGNOSIS — I429 Cardiomyopathy, unspecified: Secondary | ICD-10-CM

## 2012-03-06 DIAGNOSIS — I350 Nonrheumatic aortic (valve) stenosis: Secondary | ICD-10-CM

## 2012-03-06 DIAGNOSIS — E785 Hyperlipidemia, unspecified: Secondary | ICD-10-CM

## 2012-03-06 DIAGNOSIS — Z95810 Presence of automatic (implantable) cardiac defibrillator: Secondary | ICD-10-CM

## 2012-03-06 DIAGNOSIS — I359 Nonrheumatic aortic valve disorder, unspecified: Secondary | ICD-10-CM | POA: Diagnosis not present

## 2012-03-06 DIAGNOSIS — I428 Other cardiomyopathies: Secondary | ICD-10-CM | POA: Diagnosis not present

## 2012-03-06 DIAGNOSIS — K219 Gastro-esophageal reflux disease without esophagitis: Secondary | ICD-10-CM

## 2012-03-06 MED ORDER — CYANOCOBALAMIN 1000 MCG/ML IJ SOLN
1000.0000 ug | Freq: Once | INTRAMUSCULAR | Status: AC
  Administered 2012-03-06: 1000 ug via INTRAMUSCULAR

## 2012-03-06 NOTE — Patient Instructions (Addendum)
Today we updated your med list in our EPIC system...    Continue your current medications the same...  We gave you a B12 shot today & you should continue your monthly injections...  Call for any questions...  Let's plan a follow up visit in 6 months.Marland KitchenMarland Kitchen

## 2012-03-07 ENCOUNTER — Encounter: Payer: Self-pay | Admitting: Pulmonary Disease

## 2012-03-07 NOTE — Progress Notes (Signed)
Subjective:    Patient ID: [name], male    DOB: 05-27-1932, 78 y.o.   MRN: [id number]  HPI 78 y/o WM here for a follow up visit & review of mult medical problems... 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  ~  August 30, 2009:  he continues to do well- no new complaints or concerns... BP controlled on meds; he saw DrKatz for f/u 10/10- doing well, no CP, mild MR, ICD in place, no change in Rx... Chol controlled on Simva + Gemfib + Fish Oil; still getting B12 shots monthly; has Flu shot 10/10 & due for PNEUMOVAX now.  ~  March 08, 2010:  feels well- no new complaints or concerns... he had ICD generator changed by DrTaylor 6/11- doing well post op... as noted he has CAD, ischemic cardiomyopathy, CHB w/ pacer/ ICD, he has class I-II symptoms and still works daily at [employer]... we discussed recheck FLP & we cut his Simva80 to 1/2 tab Qhs.  ~  September 05, 2010:  another good 53mo- now working three 12H shifts per week & denies CP, palpit, SOB, etc... he tells me he also walks regularly & notes that he's good for about 1/2 mile then his jaw starts to hurt & he has to stop... he saw DrTaylor 9/11- OK w/ new generator for his AICD;  [facility] & f/u 2DEcho showed mild LVH, decr LVF w/ EF= 35-40% & mild HK, gr1DD, mild AS & MR, mild LAdil ==> no change in meds... BP controlled, Chol looks good on 1/2 tab, he still gets monthly B12 shots...   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  ~  September 07, 2011:  53mo ROV & he reports a good interval w/o new complaints or  concerns;  Requests B12 shot today & refill all meds for 90d supplies...    BP remains controlled on Metoprolol & Ramipril;  Cardiac stable on ASA/ Plavix & he denies CP, palpit, dizzy, syncope, SOB, or edema;  Followed by Delton See for Cards (seen 10/12 doing satis, no changes made) & DrTaylor for EP- AICD is functioning normally as of last check;  FLP looks good on his Simva40, Lopid600, & FishOil;  CBC is wnl on his B12 shots monthly...  ~  March 06, 2012:  53mo ROV & Doak continues stable, still working part time, & his CC is heel pain/ plantar fasciitis> he has seen Ortho/ Podiatry & we reviewed hot soaks, stretching exercises, etc...     He saw DrTaylor for Cards 2/13> ischemic cardiomyopathy, heart block w/ ICD (generator change ~64yr ago), class 1-2 symptoms- doing well & no changes made...    We reviewed prob list, meds, xrays and labs> see below for updates >> LABS 7/13:  FLP- ok x LDL=110 on Simva40;  LFTs- wnl...          Problem List:  HYPERTENSION (ICD-401.9) - controlled on TOPROL XL 100mg /d & ALTACE 10mg /d... takes meds regularly and tolerates well...  ~  7/12: BP= 120/68 and OK at home per pt; he gets rare HA which he believes is related to his BP; denies fatigue, visual changes, CP, palipit, dizziness, syncope, dyspnea, edema, etc... ~  CXR 9/12 showed dual lead pacer/ AICD, norm heart size, clear lungs, NAD... ~  2/13:  BP= 124/68 & he remains asymptomatic... ~  8/13:  BP= 122/58 & he denies CP, palpit, SOB, edema, etc...  CAD (ICD-414.00) - on ASA 81mg /d & PLAVIX 75mg /d> followed by Delton See... ISCHEMIC CARDIOMYOPATHY (ICD-414.8) - he's had prev MI w/ mult caths/ interventions. CHRONIC SYSTOLIC HEART FAILURE (ICD-428.22) AORTIC STENOSIS (ICD-424.1) > Mild by 2DEcho 10/11 MITRAL REGURGITATION (ICD-396.3) ~  last cath 5/08 showed norm Lmain, patent stent in prox LAD, 80-90% lesion in mid-LAD w/ PCI & stent placed, & 50% ostial stenosis of 1st diagonal branch of LAD; 40%mid &  70%distal CIRC; 50%prox 40%mid 40%distal RCA; Global HK w/EF=30%. ~  NuclearStressTest 4/09 showed infer scar, no ischemia, EF=40%. ~  2DEcho 10/11 showed mild LVH, decr LVF w/ EF= 35-40% & mild HK, gr1DD, mild AS & MR, mild LAdil. ~  EKG 10/12 showed pacer rhythm...  Hx of SYNCOPE (ICD-780.2) AV BLOCK, COMPLETE (ICD-426.0) AUTOMATIC IMPLANTABLE CARDIAC DEFIBRILLATOR SITU (ICD-V45.02) ~  Pacer/ ICD per DrTaylor w/ several adjustments in 2008... ~  5/11:  f/u DrTaylor w/ decision for elective generator change> done 6/11 & tol well. ~  He continues to f/u w/ DrTaylor every 6 months... ~  2/13: f/u DrTaylor doing satis, device working normally, no changes made & f/u 59yr...  HYPERCHOLESTEROLEMIA (ICD-272.0) - takes SIMVASTATIN 40mg /d, GEMFIBRIZOL 600mg /d, & FISH OIL Bid... ~  FLP 08/14/07 on Simva40 showed TChol 163, TG 94, HDL 34, LDL 111... rec incr Simvastatin to 80mg /d. ~  FLP 02/24/08 on Simva80 showed TChol 136, TG 92, HDL 38, LDL 80... continue same. ~  FLP 1/10 showed TChol 125, TG 67, HDL 32, LDL 80... rec- continue same. ~  FLP 7/10 showed TChol 124, TG 86, HDL 36, LDL 71 ~  FLP 1/11 on Simva80+Gemfib600 showed TChol 137, TG 99, HDL 39, LDL 78 ~  FLP 8/11 on Simva80+Gemfib600 showed TChol 134, TG 82, HDL 36, LDL 82... Simva decr to 40. ~  FLP 1/12 on Simva40+Gemfib600 showed TChol 157, TG 139, HDL 38, LDL 92 ~  FLP 7/12 on Simva40+Gemfib600 showed TChol 163, TG 202, HDL 43, LDL 96... Needs better low fat diet. ~  FLP 1/13 on Simva40+Gemfib600 showed TChol 149, TG 50, HDL 39, LDL 100 ~  FLP 7/13 on Simva40+Gemfib600 showed TChol 172, TG 70, HDL 48, LDL 110  GERD (ICD-530.81) - he had ERCP in 8/06 w/ norm ducts, +gallstones, s/p lap chole 2006... he uses OTC meds Prn... ~  he notes that he had a colonoscopy in the past from DrPatterson, & he reports that it was neg.  DEGENERATIVE JOINT DISEASE (ICD-715.90) - he tried Mobic but it bothered his stomach, therefore uses Tylenol  Prn.  VITAMIN B12 DEFICIENCY (ICD-266.2) - old chart not available- ? details of this Dx... on B12 shots xyrs. ~  labs 1/09 showed Hg=12.9 ~  labs 1/10 showed Hg= 12.7 ~  labs 1/11 showed Hg= 12.6 ~  labs 1/12 showed Hg= 13.2 ~  Labs 1/13 showed Hg= 12.6  Health Maintenance: ~  GI:  Followed by DrPatterson ~  GU:  PSA checked yearly & remains wnl... ~  Immunizations:  he gets the yearly Flu vaccine;  [place], he says;  given Pneumovax 1/11 age 66.   Past Surgical History  Procedure Date  . Icd implanted 2005  . Laparoscopic cholecystectomy 04/2005    Dr. Ezzard Standing  . Cardiac catheterization 2008    Outpatient Encounter Prescriptions as of 03/06/2012  Medication Sig Dispense Refill  . aspirin 81 MG tablet Take 81 mg by mouth daily.        . clopidogrel (PLAVIX) 75 MG tablet Take 1 tablet (75 mg total) by mouth daily.  90 tablet  4  . cyanocobalamin (,VITAMIN B-12,) 1000 MCG/ML injection Inject 1,000 mcg into the muscle every 30 (thirty) days.        . fish oil-omega-3 fatty acids 1000 MG capsule Take 2 g by mouth daily.        Marland Kitchen gemfibrozil (LOPID) 600 MG tablet Take 1 tablet (600 mg total) by mouth daily.  90 tablet  4  . metoprolol succinate (TOPROL-XL) 100 MG 24 hr tablet Take 1 tablet (100 mg total) by mouth daily.  90 tablet  4  . Multiple Vitamins-Minerals (MENS MULTIVITAMIN PLUS PO) Take 1 tablet by mouth daily.        . ramipril (ALTACE) 10 MG capsule Take 1 capsule (10 mg total) by mouth daily.  90 capsule  4  . simvastatin (ZOCOR) 40 MG tablet Take 1 tablet (40 mg total) by mouth every evening.  90 tablet  3   Facility-Administered Encounter Medications as of 03/06/2012  Medication Dose Route Frequency Provider Last Rate Last Dose  . cyanocobalamin ((VITAMIN B-12)) injection 1,000 mcg  1,000 mcg Intramuscular Once Michele Mcalpine, MD   1,000 mcg at 03/06/12 1312    No Known Allergies   Current Medications, Allergies, Past Medical History,  Past Surgical History, Family History, and Social History were reviewed in [facility] electronic medical record.    Review of Systems        See HPI - all other systems neg except as noted...  The patient complains of dyspnea on exertion.  The patient denies anorexia, fever, weight loss, weight gain, vision loss, decreased hearing, hoarseness, chest pain, syncope, peripheral edema, prolonged cough, headaches, hemoptysis, abdominal pain, melena, hematochezia, severe indigestion/heartburn, hematuria, incontinence, muscle weakness, suspicious skin lesions, transient blindness, difficulty walking, depression, unusual weight change, abnormal bleeding, enlarged lymph nodes, and angioedema.     Objective:   Physical Exam     WD, WN, 78 y/o WM in NAD... wt=170# ht=6'1" BMI=22... GENERAL:  Alert & oriented; pleasant & cooperative... HEENT:  [place]/AT, EOM-wnl, PERRLA, EACs-clear, TMs-wnl, NOSE-clear, THROAT-clear & wnl. NECK:  Supple w/ fairROM; no JVD; normal carotid impulses w/o bruits; no thyromegaly or nodules palpated; no lymphadenopathy. CHEST:  Clear to P & A; without wheezes/ rales/ or rhonchi heard... HEART:  Pacer in L upper chest area~ nontender, RR, gr 1/6 SEM without rubs or gallops detected... ABDOMEN:  Soft & nontender; normal bowel sounds; no organomegaly or masses palpated... EXT: without deformities, mild arthritic changes; no varicose veins/ venous insuffic/ or edema. NEURO:  CN's intact; no focal neuro deficits apprec... DERM:  No lesions noted; no rash etc...  RADIOLOGY DATA:  Reviewed in the EPIC EMR & discussed w/ the patient...    >>Last CXR 9/12 showed dual lead pacer/ AICD, norm heart size, clear lungs, NAD...    >>Last EKG 10/12 showed pacer rhythm...    >>Last 2DEcho 10/11 showed mild LVH, decr LVF w/ EF= 35-40% & mild HK, gr1DD, mild AS & MR, mild LAdil.  LABORATORY DATA:  Reviewed in  the EPIC EMR & discussed w/ the patient...    >>LABS 1/13 showed FLP- ok on meds;   Chems- wnl;  CBC- wnl;  TSH- wnl;  PSA- wnl...   Assessment & Plan:   HBP>  Controlled on Metoprolol & Ramipril, continue same...  CAD, Cardiomyopathy, CHF, ValvHeartDis w/ AS & MR>  Followed by DrKatz/ Ladona Ridgel & stable on ASA/ Plavix, still working 3d per week etc...  Hx syncope, AVBlock, Pacer/AICD>  Followed by DrTaylor & stable, continue same...  LIPIDS>  FLP looks good on Simva40, Gemfib 600mg /d, & Fish Oil...  DJD>  He uses Tylenol as needed; still working 3d per week...  B-12 defic>  He remains on B12 shots monthly...   Patient's Medications  New Prescriptions   No medications on file  Previous Medications   ASPIRIN 81 MG TABLET    Take 81 mg by mouth daily.     CLOPIDOGREL (PLAVIX) 75 MG TABLET    Take 1 tablet (75 mg total) by mouth daily.   CYANOCOBALAMIN (,VITAMIN B-12,) 1000 MCG/ML INJECTION    Inject 1,000 mcg into the muscle every 30 (thirty) days.     FISH OIL-OMEGA-3 FATTY ACIDS 1000 MG CAPSULE    Take 2 g by mouth daily.     GEMFIBROZIL (LOPID) 600 MG TABLET    Take 1 tablet (600 mg total) by mouth daily.   METOPROLOL SUCCINATE (TOPROL-XL) 100 MG 24 HR TABLET    Take 1 tablet (100 mg total) by mouth daily.   MULTIPLE VITAMINS-MINERALS (MENS MULTIVITAMIN PLUS PO)    Take 1 tablet by mouth daily.     RAMIPRIL (ALTACE) 10 MG CAPSULE    Take 1 capsule (10 mg total) by mouth daily.   SIMVASTATIN (ZOCOR) 40 MG TABLET    Take 1 tablet (40 mg total) by mouth every evening.  Modified Medications   No medications on file  Discontinued Medications   No medications on file

## 2012-03-20 ENCOUNTER — Ambulatory Visit (INDEPENDENT_AMBULATORY_CARE_PROVIDER_SITE_OTHER): Payer: BC Managed Care – PPO | Admitting: *Deleted

## 2012-03-20 ENCOUNTER — Encounter: Payer: Self-pay | Admitting: Internal Medicine

## 2012-03-20 DIAGNOSIS — I428 Other cardiomyopathies: Secondary | ICD-10-CM | POA: Diagnosis not present

## 2012-03-20 DIAGNOSIS — Z95810 Presence of automatic (implantable) cardiac defibrillator: Secondary | ICD-10-CM | POA: Diagnosis not present

## 2012-03-20 DIAGNOSIS — I429 Cardiomyopathy, unspecified: Secondary | ICD-10-CM

## 2012-03-21 DIAGNOSIS — M722 Plantar fascial fibromatosis: Secondary | ICD-10-CM | POA: Diagnosis not present

## 2012-03-21 DIAGNOSIS — M7989 Other specified soft tissue disorders: Secondary | ICD-10-CM | POA: Diagnosis not present

## 2012-03-24 ENCOUNTER — Encounter: Payer: Self-pay | Admitting: *Deleted

## 2012-03-24 LAB — REMOTE ICD DEVICE
AL AMPLITUDE: 3.1 mv
AL IMPEDENCE ICD: 440 Ohm
BRDY-0003RA: 120 {beats}/min
BRDY-0004RA: 120 {beats}/min
HV IMPEDENCE: 38 Ohm
MODE SWITCH EPISODES: 1
TZAT-0013SLOWVT: 4
TZAT-0018SLOWVT: NEGATIVE
TZAT-0020SLOWVT: 1 ms
TZON-0003SLOWVT: 375 ms
TZON-0005SLOWVT: 6
TZON-0010SLOWVT: 40 ms
TZST-0001SLOWVT: 2
TZST-0001SLOWVT: 4
TZST-0003SLOWVT: 36 J
TZST-0003SLOWVT: 40 J

## 2012-03-28 ENCOUNTER — Encounter: Payer: Self-pay | Admitting: *Deleted

## 2012-04-04 ENCOUNTER — Other Ambulatory Visit: Payer: Self-pay | Admitting: Sports Medicine

## 2012-04-04 ENCOUNTER — Ambulatory Visit
Admission: RE | Admit: 2012-04-04 | Discharge: 2012-04-04 | Disposition: A | Discharge status: Home / Self Care | Payer: BC Managed Care – PPO | Source: Ambulatory Visit | Attending: Sports Medicine | Admitting: Sports Medicine

## 2012-04-04 DIAGNOSIS — M8430XA Stress fracture, unspecified site, initial encounter for fracture: Secondary | ICD-10-CM | POA: Diagnosis not present

## 2012-04-04 DIAGNOSIS — M19079 Primary osteoarthritis, unspecified ankle and foot: Secondary | ICD-10-CM | POA: Diagnosis not present

## 2012-04-04 DIAGNOSIS — M79671 Pain in right foot: Secondary | ICD-10-CM

## 2012-04-08 ENCOUNTER — Ambulatory Visit (INDEPENDENT_AMBULATORY_CARE_PROVIDER_SITE_OTHER): Payer: BC Managed Care – PPO

## 2012-04-08 DIAGNOSIS — D649 Anemia, unspecified: Secondary | ICD-10-CM

## 2012-04-08 MED ORDER — CYANOCOBALAMIN 1000 MCG/ML IJ SOLN
1000.0000 ug | Freq: Once | INTRAMUSCULAR | Status: AC
  Administered 2012-04-08: 1000 ug via INTRAMUSCULAR

## 2012-05-08 ENCOUNTER — Ambulatory Visit (INDEPENDENT_AMBULATORY_CARE_PROVIDER_SITE_OTHER): Payer: BC Managed Care – PPO

## 2012-05-08 DIAGNOSIS — D649 Anemia, unspecified: Secondary | ICD-10-CM

## 2012-05-09 MED ORDER — CYANOCOBALAMIN 1000 MCG/ML IJ SOLN
1000.0000 ug | Freq: Once | INTRAMUSCULAR | Status: AC
  Administered 2012-05-09: 1000 ug via INTRAMUSCULAR

## 2012-06-09 ENCOUNTER — Ambulatory Visit (INDEPENDENT_AMBULATORY_CARE_PROVIDER_SITE_OTHER): Payer: BC Managed Care – PPO

## 2012-06-09 DIAGNOSIS — D649 Anemia, unspecified: Secondary | ICD-10-CM | POA: Diagnosis not present

## 2012-06-10 MED ORDER — CYANOCOBALAMIN 1000 MCG/ML IJ SOLN
1000.0000 ug | Freq: Once | INTRAMUSCULAR | Status: AC
  Administered 2012-06-10: 1000 ug via INTRAMUSCULAR

## 2012-06-23 ENCOUNTER — Ambulatory Visit (INDEPENDENT_AMBULATORY_CARE_PROVIDER_SITE_OTHER): Payer: BC Managed Care – PPO | Admitting: *Deleted

## 2012-06-23 ENCOUNTER — Encounter: Payer: Self-pay | Admitting: Internal Medicine

## 2012-06-23 DIAGNOSIS — I5022 Chronic systolic (congestive) heart failure: Secondary | ICD-10-CM

## 2012-06-23 DIAGNOSIS — Z95810 Presence of automatic (implantable) cardiac defibrillator: Secondary | ICD-10-CM

## 2012-06-23 DIAGNOSIS — I429 Cardiomyopathy, unspecified: Secondary | ICD-10-CM

## 2012-06-23 DIAGNOSIS — I428 Other cardiomyopathies: Secondary | ICD-10-CM | POA: Diagnosis not present

## 2012-06-24 LAB — REMOTE ICD DEVICE
AL AMPLITUDE: 2.1 mv
AL IMPEDENCE ICD: 430 Ohm
ATRIAL PACING ICD: 38 pct
BAMS-0001: 150 {beats}/min
BAMS-0003: 70 {beats}/min
BRDY-0002RV: 50 {beats}/min
DEVICE MODEL ICD: 610170
TZAT-0004SLOWVT: 8
TZAT-0012SLOWVT: 200 ms
TZAT-0013SLOWVT: 4
TZAT-0018SLOWVT: NEGATIVE
TZAT-0019SLOWVT: 7.5 V
TZON-0003SLOWVT: 375 ms
TZON-0004SLOWVT: 12
TZST-0001SLOWVT: 3
TZST-0003SLOWVT: 30 J

## 2012-07-08 ENCOUNTER — Ambulatory Visit: Payer: BC Managed Care – PPO

## 2012-07-09 ENCOUNTER — Ambulatory Visit (INDEPENDENT_AMBULATORY_CARE_PROVIDER_SITE_OTHER): Payer: BC Managed Care – PPO

## 2012-07-09 DIAGNOSIS — D649 Anemia, unspecified: Secondary | ICD-10-CM | POA: Diagnosis not present

## 2012-07-09 MED ORDER — CYANOCOBALAMIN 1000 MCG/ML IJ SOLN
1000.0000 ug | Freq: Once | INTRAMUSCULAR | Status: AC
  Administered 2012-07-09: 1000 ug via INTRAMUSCULAR

## 2012-07-16 ENCOUNTER — Encounter: Payer: Self-pay | Admitting: *Deleted

## 2012-08-08 ENCOUNTER — Ambulatory Visit (INDEPENDENT_AMBULATORY_CARE_PROVIDER_SITE_OTHER): Payer: BC Managed Care – PPO

## 2012-08-08 DIAGNOSIS — E538 Deficiency of other specified B group vitamins: Secondary | ICD-10-CM | POA: Diagnosis not present

## 2012-08-11 MED ORDER — CYANOCOBALAMIN 1000 MCG/ML IJ SOLN
1000.0000 ug | Freq: Once | INTRAMUSCULAR | Status: AC
  Administered 2012-08-11: 1000 ug via INTRAMUSCULAR

## 2012-09-02 ENCOUNTER — Telehealth: Payer: Self-pay | Admitting: Pulmonary Disease

## 2012-09-02 DIAGNOSIS — E785 Hyperlipidemia, unspecified: Secondary | ICD-10-CM

## 2012-09-02 DIAGNOSIS — I2510 Atherosclerotic heart disease of native coronary artery without angina pectoris: Secondary | ICD-10-CM

## 2012-09-02 NOTE — Telephone Encounter (Signed)
Pt last seen 8.1.13 for 6 month follow up, follow up in 6 months.   Lipid and LFTs done at that ov. Full labs done at 2.1.13 w/ BMET, LFTs, CBCD, TSH, PSA. Upcoming ov w/ SN 2.5.14.  LMOM TCB x1 > need to know a future date so that lab orders may be placed for the bmet, lft, cbcd, tsh, lipid and psa.

## 2012-09-02 NOTE — Telephone Encounter (Signed)
Spoke with patient, patient states he will be in at the end of this week to have labs drawn.  Order placed. Nothing further needed at this time.

## 2012-09-05 ENCOUNTER — Other Ambulatory Visit (INDEPENDENT_AMBULATORY_CARE_PROVIDER_SITE_OTHER): Payer: BC Managed Care – PPO

## 2012-09-05 DIAGNOSIS — E785 Hyperlipidemia, unspecified: Secondary | ICD-10-CM

## 2012-09-05 DIAGNOSIS — I2510 Atherosclerotic heart disease of native coronary artery without angina pectoris: Secondary | ICD-10-CM

## 2012-09-05 LAB — LIPID PANEL
Cholesterol: 152 mg/dL (ref 0–200)
HDL: 38.3 mg/dL — ABNORMAL LOW (ref >39.00)
LDL Cholesterol: 99 mg/dL (ref 0–99)
Total CHOL/HDL Ratio: 4
Triglycerides: 74 mg/dL (ref 0.0–149.0)
VLDL: 14.8 mg/dL (ref 0.0–40.0)

## 2012-09-05 LAB — CBC WITH DIFFERENTIAL/PLATELET
Basophils Relative: 0.5 % (ref 0.0–3.0)
Eosinophils Relative: 9.5 % — ABNORMAL HIGH (ref 0.0–5.0)
Lymphocytes Relative: 26.7 % (ref 12.0–46.0)
MCV: 92.2 fl (ref 78.0–100.0)
Monocytes Absolute: 0.5 10*3/uL (ref 0.1–1.0)
Monocytes Relative: 10 % (ref 3.0–12.0)
Neutrophils Relative %: 53.3 % (ref 43.0–77.0)
Platelets: 203 10*3/uL (ref 150.0–400.0)
RBC: 4.03 Mil/uL — ABNORMAL LOW (ref 4.22–5.81)
WBC: 4.7 10*3/uL (ref 4.5–10.5)

## 2012-09-05 LAB — HEPATIC FUNCTION PANEL
ALT: 17 U/L (ref 0–53)
Albumin: 4.1 g/dL (ref 3.5–5.2)
Bilirubin, Direct: 0.2 mg/dL (ref 0.0–0.3)
Total Protein: 6.9 g/dL (ref 6.0–8.3)

## 2012-09-05 LAB — PSA: PSA: 2.96 ng/mL (ref 0.10–4.00)

## 2012-09-05 LAB — BASIC METABOLIC PANEL
CO2: 29 mEq/L (ref 19–32)
Calcium: 9.3 mg/dL (ref 8.4–10.5)
Creatinine, Ser: 1 mg/dL (ref 0.4–1.5)
Sodium: 139 mEq/L (ref 135–145)

## 2012-09-08 ENCOUNTER — Ambulatory Visit (INDEPENDENT_AMBULATORY_CARE_PROVIDER_SITE_OTHER): Payer: BC Managed Care – PPO

## 2012-09-08 DIAGNOSIS — D649 Anemia, unspecified: Secondary | ICD-10-CM | POA: Diagnosis not present

## 2012-09-09 MED ORDER — CYANOCOBALAMIN 1000 MCG/ML IJ SOLN
1000.0000 ug | Freq: Once | INTRAMUSCULAR | Status: AC
  Administered 2012-09-09: 1000 ug via INTRAMUSCULAR

## 2012-09-10 ENCOUNTER — Encounter: Payer: Self-pay | Admitting: Internal Medicine

## 2012-09-10 ENCOUNTER — Ambulatory Visit (INDEPENDENT_AMBULATORY_CARE_PROVIDER_SITE_OTHER): Payer: BC Managed Care – PPO | Admitting: Internal Medicine

## 2012-09-10 ENCOUNTER — Encounter: Payer: Self-pay | Admitting: Pulmonary Disease

## 2012-09-10 ENCOUNTER — Ambulatory Visit (INDEPENDENT_AMBULATORY_CARE_PROVIDER_SITE_OTHER): Payer: BC Managed Care – PPO | Admitting: Pulmonary Disease

## 2012-09-10 VITALS — BP 128/60 | HR 50 | Ht 72.0 in | Wt 173.2 lb

## 2012-09-10 VITALS — BP 148/60 | HR 48 | Temp 96.5°F | Ht 72.0 in | Wt 175.2 lb

## 2012-09-10 DIAGNOSIS — I2510 Atherosclerotic heart disease of native coronary artery without angina pectoris: Secondary | ICD-10-CM

## 2012-09-10 DIAGNOSIS — E785 Hyperlipidemia, unspecified: Secondary | ICD-10-CM

## 2012-09-10 DIAGNOSIS — I059 Rheumatic mitral valve disease, unspecified: Secondary | ICD-10-CM

## 2012-09-10 DIAGNOSIS — E538 Deficiency of other specified B group vitamins: Secondary | ICD-10-CM

## 2012-09-10 DIAGNOSIS — I428 Other cardiomyopathies: Secondary | ICD-10-CM

## 2012-09-10 DIAGNOSIS — I359 Nonrheumatic aortic valve disorder, unspecified: Secondary | ICD-10-CM

## 2012-09-10 DIAGNOSIS — I10 Essential (primary) hypertension: Secondary | ICD-10-CM

## 2012-09-10 DIAGNOSIS — Z95810 Presence of automatic (implantable) cardiac defibrillator: Secondary | ICD-10-CM

## 2012-09-10 DIAGNOSIS — I5022 Chronic systolic (congestive) heart failure: Secondary | ICD-10-CM | POA: Diagnosis not present

## 2012-09-10 DIAGNOSIS — I340 Nonrheumatic mitral (valve) insufficiency: Secondary | ICD-10-CM

## 2012-09-10 DIAGNOSIS — I429 Cardiomyopathy, unspecified: Secondary | ICD-10-CM

## 2012-09-10 DIAGNOSIS — K219 Gastro-esophageal reflux disease without esophagitis: Secondary | ICD-10-CM

## 2012-09-10 DIAGNOSIS — M1990 Unspecified osteoarthritis, unspecified site: Secondary | ICD-10-CM

## 2012-09-10 DIAGNOSIS — I350 Nonrheumatic aortic (valve) stenosis: Secondary | ICD-10-CM

## 2012-09-10 LAB — ICD DEVICE OBSERVATION
AL IMPEDENCE ICD: 437.5 Ohm
AL THRESHOLD: 0.75 V
ATRIAL PACING ICD: 37 pct
BAMS-0003: 70 {beats}/min
FVT: 0
RV LEAD THRESHOLD: 0.875 V
TOT-0006: 20110609000000
TOT-0008: 0
TZAT-0012SLOWVT: 200 ms
TZAT-0013SLOWVT: 4
TZON-0005SLOWVT: 6
TZST-0001SLOWVT: 5
TZST-0003SLOWVT: 36 J
VENTRICULAR PACING ICD: 99.24 pct

## 2012-09-10 MED ORDER — RAMIPRIL 10 MG PO CAPS: 10.0000 mg | ORAL_CAPSULE | Freq: Every day | ORAL | Status: DC

## 2012-09-10 MED ORDER — SIMVASTATIN 40 MG PO TABS: 40.0000 mg | ORAL_TABLET | Freq: Every evening | ORAL | Status: DC

## 2012-09-10 MED ORDER — CLOPIDOGREL BISULFATE 75 MG PO TABS: 75.0000 mg | ORAL_TABLET | Freq: Every day | ORAL | Status: DC

## 2012-09-10 MED ORDER — GEMFIBROZIL 600 MG PO TABS: 600.0000 mg | ORAL_TABLET | Freq: Every day | ORAL | Status: DC

## 2012-09-10 MED ORDER — METOPROLOL SUCCINATE ER 100 MG PO TB24: 100.0000 mg | ORAL_TABLET | Freq: Every day | ORAL | Status: DC

## 2012-09-10 NOTE — Progress Notes (Signed)
Subjective:    Patient ID: [name], male    DOB: Jul 30, 1932, 79 y.o.   MRN: [id number]  HPI 79 y/o WM here for a follow up visit & review of mult medical problems... 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  ~  September 05, 2010:  another good 89mo- now working three 12H shifts per week & denies CP, palpit, SOB, etc... he tells me he also walks regularly & notes that he's good for about 1/2 mile then his jaw starts to hurt & he has to stop... he saw DrTaylor 9/11- OK w/ new generator for his AICD;  he saw [facility] 10/11 & f/u 2DEcho showed mild LVH, decr LVF w/ EF= 35-40% & mild HK, gr1DD, mild AS & MR, mild LAdil ==> no change in meds... BP controlled, Chol looks good on 1/2 tab, he still gets monthly B12 shots...   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  ~  September 07, 2011:  89mo ROV & he reports a good interval w/o new complaints or concerns;  Requests B12 shot today & refill all meds for 90d supplies...    BP remains controlled on Metoprolol & Ramipril;  Cardiac stable on ASA/ Plavix & he denies CP, palpit, dizzy, syncope, SOB, or edema;  Followed by Delton See for Cards (seen 10/12 doing satis, no changes made) & DrTaylor for EP- AICD is functioning normally as of last check;  FLP looks good on his Simva40, Lopid600, & FishOil;  CBC is wnl on his B12 shots monthly...  ~  March 06, 2012:  89mo ROV & [name] continues stable, still working part time, & his CC is heel pain/ plantar fasciitis> he has seen Ortho/ Podiatry & we reviewed hot soaks, stretching exercises, etc...      He saw DrTaylor for Cards 2/13> ischemic cardiomyopathy, heart block w/ ICD (generator change ~62yr ago), class 1-2 symptoms- doing well & no changes made...    We reviewed prob list, meds, xrays and labs> see below for updates >> LABS 7/13:  FLP- ok x LDL=110 on Simva40;  LFTs- wnl...  ~  September 10, 2012:  89mo ROV & [name] continues to do well- no new complaints or concerns... We reviewed the following medical problems during today's office visit >>     HBP> on MetopER100, Altace10;  BP= 148/60 & he denies CP, palpit, dizzy, SOB, edema...    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    Hx Syncope, AV block, Implanted Defib> followed by DrTaylor w/ pacer checks every 2-3mo he says; generator changed 6/11; he denies CP/angina, dizzy/ syncope, etc...    Chol> on Simva40, Lopid600, FishOil; last FLP 7/13 showed TChol 172, TG 70, HDL 48, LDL 110    GI- GERD> he had LapChole 2006;  he notes some constip & uses Benefiber which helps...    DJD> he just uses Tylenol as needed...    VitB12 defic> on B12 shots monthly... We reviewed prob list, meds, xrays and labs> see below for updates >> he had the 2013 Flu vaccine 10/13; he requests 90d refills...          Problem List:  HYPERTENSION (ICD-401.9) - controlled on TOPROL XL 100mg /d & ALTACE 10mg /d... takes meds regularly and tolerates well...  ~  7/12: BP= 120/68 and OK at home per pt; he gets rare HA which he believes is related to his BP; denies fatigue, visual changes, CP, palipit, dizziness, syncope, dyspnea, edema, etc... ~  CXR 9/12 showed dual lead pacer/ AICD, norm heart size, clear lungs, NAD... ~  2/13:  BP= 124/68 & he remains asymptomatic... ~  8/13:  BP= 122/58 & he denies CP, palpit, SOB, edema, etc... ~  2/14: on MetopER100, Altace10;  BP=  148/60 & he denies CP, palpit, dizzy, SOB, edema.  CAD (ICD-414.00) - on ASA 81mg /d & PLAVIX 75mg /d> followed by Delton See... ISCHEMIC CARDIOMYOPATHY (ICD-414.8) - he's had prev MI w/ mult caths/ interventions. CHRONIC SYSTOLIC HEART FAILURE (ICD-428.22) AORTIC STENOSIS (ICD-424.1) > Mild by 2DEcho 10/11 MITRAL REGURGITATION (ICD-396.3) ~  last cath 5/08 showed norm Lmain, patent stent in prox LAD, 80-90% lesion in mid-LAD w/ PCI & stent placed, & 50% ostial stenosis of 1st diagonal branch of LAD; 40%mid & 70%distal CIRC; 50%prox 40%mid 40%distal RCA; Global HK w/EF=30%. ~  NuclearStressTest 4/09 showed infer scar, no ischemia, EF=40%. ~  2DEcho 10/11 showed mild LVH, decr LVF w/ EF= 35-40% & mild HK, gr1DD, mild AS & MR, mild LAdil. ~  EKG 10/12 showed pacer rhythm... 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   Hx of SYNCOPE (ICD-780.2) AV BLOCK, COMPLETE (ICD-426.0) AUTOMATIC IMPLANTABLE CARDIAC DEFIBRILLATOR SITU (ICD-V45.02) ~  Pacer/ ICD per DrTaylor w/ several adjustments in 2008... ~  5/11:  f/u DrTaylor w/ decision for elective generator change> done 6/11 & tol well. ~  He continues to f/u w/ DrTaylor every 6 months... ~  2/13: f/u DrTaylor doing satis, device working normally, no changes made & f/u 12yr... ~  2/14: Hx Syncope, AV block, Implanted Defib> followed by DrTaylor w/ pacer checks every 2-29mo he says; generator changed 6/11; he denies CP/angina, dizzy/ syncope, etc.  HYPERCHOLESTEROLEMIA (ICD-272.0) - takes SIMVASTATIN 40mg /d, GEMFIBRIZOL 600mg /d, & FISH OIL Bid... ~  FLP 08/14/07 on Simva40 showed TChol 163, TG 94, HDL 34, LDL 111... rec incr Simvastatin to 80mg /d. ~  FLP 02/24/08 on Simva80 showed TChol 136, TG 92, HDL 38, LDL 80... continue same. ~  FLP 1/10 showed TChol  125, TG 67, HDL 32, LDL 80... rec- continue same. ~  FLP 7/10 showed TChol 124, TG 86, HDL 36, LDL 71 ~  FLP 1/11 on Simva80+Gemfib600 showed TChol 137, TG 99, HDL 39, LDL 78 ~  FLP 8/11 on Simva80+Gemfib600 showed TChol 134, TG 82, HDL 36, LDL 82... Simva decr to 40. ~  FLP 1/12 on Simva40+Gemfib600 showed TChol 157, TG 139, HDL 38, LDL 92 ~  FLP 7/12 on Simva40+Gemfib600 showed TChol 163, TG 202, HDL 43, LDL 96... Needs better low fat diet. ~  FLP 1/13 on Simva40+Gemfib600 showed TChol 149,  TG 50, HDL 39, LDL 100 ~  FLP 7/13 on Simva40+Gemfib600 showed TChol 172, TG 70, HDL 48, LDL 110  GERD (ICD-530.81) - he had ERCP in 8/06 w/ norm ducts, +gallstones, s/p lap chole 2006... he uses OTC meds Prn... ~  he notes that he had a colonoscopy in the past from DrPatterson, & he reports that it was neg.  DEGENERATIVE JOINT DISEASE (ICD-715.90) - he tried Mobic but it bothered his stomach, therefore uses Tylenol Prn.  VITAMIN B12 DEFICIENCY (ICD-266.2) - old chart not available- ? details of this Dx... on B12 shots xyrs. ~  labs 1/09 showed Hg=12.9 ~  labs 1/10 showed Hg= 12.7 ~  labs 1/11 showed Hg= 12.6 ~  labs 1/12 showed Hg= 13.2 ~  Labs 1/13 showed Hg= 12.6  Health Maintenance: ~  GI:  Followed by DrPatterson ~  GU:  PSA checked yearly & remains wnl... ~  Immunizations:  he gets the yearly Flu vaccine;  [place], he says;  given Pneumovax 1/11 age 79.   Past Surgical History  Procedure Date  . Icd implanted 2005  . Laparoscopic cholecystectomy 04/2005    Dr. Ezzard Standing  . Cardiac catheterization 2008    Outpatient Encounter Prescriptions as of 09/10/2012  Medication Sig Dispense Refill  . aspirin 81 MG tablet Take 81 mg by mouth daily.        . clopidogrel (PLAVIX) 75 MG tablet Take 1 tablet (75 mg total) by mouth daily.  90 tablet  4  . cyanocobalamin (,VITAMIN B-12,) 1000 MCG/ML injection Inject 1,000 mcg into the muscle every 30 (thirty) days.         . fish oil-omega-3 fatty acids 1000 MG capsule Take 2 g by mouth daily.        Marland Kitchen gemfibrozil (LOPID) 600 MG tablet Take 1 tablet (600 mg total) by mouth daily.  90 tablet  4  . metoprolol succinate (TOPROL-XL) 100 MG 24 hr tablet Take 1 tablet (100 mg total) by mouth daily.  90 tablet  4  . Multiple Vitamins-Minerals (MENS MULTIVITAMIN PLUS PO) Take 1 tablet by mouth daily.        . ramipril (ALTACE) 10 MG capsule Take 1 capsule (10 mg total) by mouth daily.  90 capsule  4  . simvastatin (ZOCOR) 40 MG tablet Take 1 tablet (40 mg total) by mouth every evening.  90 tablet  3    No Known Allergies   Current Medications, Allergies, Past Medical History, Past Surgical History, Family History, and Social History were reviewed in [employer] record.    Review of Systems        See HPI - all other systems neg except as noted...  The patient complains of dyspnea on exertion.  The patient denies anorexia, fever, weight loss, weight gain, vision loss, decreased hearing, hoarseness, chest pain, syncope, peripheral edema, prolonged cough, headaches, hemoptysis, abdominal pain, melena, hematochezia, severe indigestion/heartburn, hematuria, incontinence, muscle weakness, suspicious skin lesions, transient blindness, difficulty walking, depression, unusual weight change, abnormal bleeding, enlarged lymph nodes, and angioedema.     Objective:   Physical Exam     WD, WN, 79 y/o WM in NAD... wt=170# ht=6'1" BMI=22... GENERAL:  Alert & oriented; pleasant & cooperative... HEENT:  [place]/AT, EOM-wnl, PERRLA, EACs-clear, TMs-wnl, NOSE-clear, THROAT-clear & wnl. NECK:  Supple w/ fairROM; no JVD; normal carotid impulses w/o bruits; no thyromegaly or nodules palpated; no lymphadenopathy. CHEST:  Clear to P & A; without  wheezes/ rales/ or rhonchi heard... HEART:  Pacer in L upper chest area~ nontender, RR, gr 1/6 SEM without rubs or gallops detected... ABDOMEN:  Soft & nontender; normal bowel  sounds; no organomegaly or masses palpated... EXT: without deformities, mild arthritic changes; no varicose veins/ venous insuffic/ or edema. NEURO:  CN's intact; no focal neuro deficits apprec... DERM:  No lesions noted; no rash etc...  RADIOLOGY DATA:  Reviewed in the EPIC EMR & discussed w/ the patient...   LABORATORY DATA:  Reviewed in the EPIC EMR & discussed w/ the patient...     Assessment & Plan:    HBP>  Controlled on Metoprolol & Ramipril, continue same...  CAD, Cardiomyopathy, CHF, ValvHeartDis w/ AS & MR>  Followed by DrKatz/ Ladona Ridgel & stable on ASA/ Plavix, still working 3d per week etc...  Hx syncope, AVBlock, Pacer/AICD>  Followed by DrTaylor & stable, continue same...  LIPIDS>  FLP looks OK on Simva40, Gemfib 600mg /d, & Fish Oil...  DJD>  He uses Tylenol as needed; still working 3d per week...  B-12 defic>  He remains on B12 shots monthly...   Patient's Medications  New Prescriptions   No medications on file  Previous Medications   ASPIRIN 81 MG TABLET    Take 81 mg by mouth daily.     CYANOCOBALAMIN (,VITAMIN B-12,) 1000 MCG/ML INJECTION    Inject 1,000 mcg into the muscle every 30 (thirty) days.     FISH OIL-OMEGA-3 FATTY ACIDS 1000 MG CAPSULE    Take 2 g by mouth daily.     MULTIPLE VITAMINS-MINERALS (MENS MULTIVITAMIN PLUS PO)    Take 1 tablet by mouth daily.    Modified Medications   Modified Medication Previous Medication   CLOPIDOGREL (PLAVIX) 75 MG TABLET clopidogrel (PLAVIX) 75 MG tablet      Take 1 tablet (75 mg total) by mouth daily.    Take 1 tablet (75 mg total) by mouth daily.   GEMFIBROZIL (LOPID) 600 MG TABLET gemfibrozil (LOPID) 600 MG tablet      Take 1 tablet (600 mg total) by mouth daily.    Take 1 tablet (600 mg total) by mouth daily.   METOPROLOL SUCCINATE (TOPROL-XL) 100 MG 24 HR TABLET metoprolol succinate (TOPROL-XL) 100 MG 24 hr tablet      Take 1 tablet (100 mg total) by mouth daily.    Take 1 tablet (100 mg total) by mouth daily.    RAMIPRIL (ALTACE) 10 MG CAPSULE ramipril (ALTACE) 10 MG capsule      Take 1 capsule (10 mg total) by mouth daily.    Take 1 capsule (10 mg total) by mouth daily.   SIMVASTATIN (ZOCOR) 40 MG TABLET simvastatin (ZOCOR) 40 MG tablet      Take 1 tablet (40 mg total) by mouth every evening.    Take 1 tablet (40 mg total) by mouth every evening.  Discontinued Medications   No medications on file

## 2012-09-10 NOTE — Assessment & Plan Note (Signed)
His chronic systolic heart failure is class II. Despite his fluid index being elevated, his weight is unchanged and he denies dyspnea. I've encouraged the patient to maintain a low-sodium diet. I will not change his medical therapy at this time.

## 2012-09-10 NOTE — Patient Instructions (Addendum)
Today we updated your med list in our EPIC system...    Continue your current medications the same...    We refilled your meds per request...  We reviewed your recent blood work 7 gave you a copy for your records...  Call for any questions...  Let's continue our 6 month follow up visits.Marland KitchenMarland Kitchen

## 2012-09-10 NOTE — Assessment & Plan Note (Signed)
His [employer] biventricular ICD is working normally. We'll plan to recheck in several months. 

## 2012-09-10 NOTE — Patient Instructions (Addendum)
Your physician wants you to follow-up in: 12 months with Dr Taylor You will receive a reminder letter in the mail two months in advance. If you don't receive a letter, please call our office to schedule the follow-up appointment.    Remote monitoring is used to monitor your Pacemaker of ICD from home. This monitoring reduces the number of office visits required to check your device to one time per year. It allows us to keep an eye on the functioning of your device to ensure it is working properly. You are scheduled for a device check from home on 12/08/2012. You may send your transmission at any time that day. If you have a wireless device, the transmission will be sent automatically. After your physician reviews your transmission, you will receive a postcard with your next transmission date.   

## 2012-09-10 NOTE — Progress Notes (Signed)
HPI [name] returns today for followup. He is a very pleasant 79 year old man with an ischemic cardiomyopathy, complete heart block, dyslipidemia, chronic systolic heart failure, status post biventricular ICD implantation. In the interim, he has done well. He denies chest pain, shortness of breath, peripheral edema, syncope, and has had no ICD shocks. He is looking forward to starting her garden early this spring. No Known Allergies   Current Outpatient Prescriptions  Medication Sig Dispense Refill  . aspirin 81 MG tablet Take 81 mg by mouth daily.        . clopidogrel (PLAVIX) 75 MG tablet Take 1 tablet (75 mg total) by mouth daily.  90 tablet  4  . cyanocobalamin (,VITAMIN B-12,) 1000 MCG/ML injection Inject 1,000 mcg into the muscle every 30 (thirty) days.        . fish oil-omega-3 fatty acids 1000 MG capsule Take 2 g by mouth daily.        Marland Kitchen gemfibrozil (LOPID) 600 MG tablet Take 1 tablet (600 mg total) by mouth daily.  90 tablet  4  . metoprolol succinate (TOPROL-XL) 100 MG 24 hr tablet Take 1 tablet (100 mg total) by mouth daily.  90 tablet  4  . Multiple Vitamins-Minerals (MENS MULTIVITAMIN PLUS PO) Take 1 tablet by mouth daily.        . ramipril (ALTACE) 10 MG capsule Take 1 capsule (10 mg total) by mouth daily.  90 capsule  4  . simvastatin (ZOCOR) 40 MG tablet Take 1 tablet (40 mg total) by mouth every evening.  90 tablet  3     Past Medical History  Diagnosis Date  . Hypertension   . CAD (coronary artery disease)     DES to LAD 2005 /  DES to LAD 2008  . LBBB (left bundle branch block)   . Cardiomyopathy     Ischemic, EF 30%, 2005  . Mitral regurgitation     Mild, echo, October, 2011  . Aortic stenosis     Mild, echo, October, 2011  . AV block, complete     Permanent pacemaker  . ICD (implantable cardiac defibrillator), single, in situ     for syncope and arrhythmia 2005 / generator change June, 2011  . Dyslipidemia   . GERD (gastroesophageal reflux disease)   .  DJD (degenerative joint disease)   . Anemia, unspecified   . Vitamin B12 deficiency   . Ejection fraction < 50%     EF 30%, echo, 2005  /  EF 30%, catheterization, 2008  . Syncope     2005, ICD placed  . Systolic CHF, chronic   . Rectal fissure   . Hyperlipidemia     ROS:   All systems reviewed and negative except as noted in the HPI.   Past Surgical History  Procedure Date  . Icd implanted 2005  . Laparoscopic cholecystectomy 04/2005    Dr. Ezzard Standing  . Cardiac catheterization 2008     Family History  Problem Relation Age of Onset  . Stroke Brother   . Lung cancer Brother   . Stroke Mother   . Cancer Mother 93  . Stroke Father 51     History   Social History  . Marital Status: Widowed    Spouse Name: N/A    Number of Children: 2  . Years of Education: N/A   Occupational History  . repairs looms at [employer]    Social History Main Topics  . Smoking status: Former Smoker  Types: Cigarettes    Quit date: 08/06/1952  . Smokeless tobacco: Not on file  . Alcohol Use: No  . Drug Use: No  . Sexually Active: Not on file   Other Topics Concern  . Not on file   Social History Narrative  . No narrative on file     BP 128/60  Pulse 50  Ht 6' (1.829 m)  Wt 173 lb 3.2 oz (78.563 kg)  BMI 23.49 kg/m2  Physical Exam:  Well appearing 79 year old man, NAD HEENT: Unremarkable Neck:  No JVD, no thyromegally Lungs:  Clear with no wheezes, rales, or rhonchi. HEART:  Regular rate rhythm, no murmurs, no rubs, no clicks Abd:  soft, positive bowel sounds, no organomegally, no rebound, no guarding Ext:  2 plus pulses, no edema, no cyanosis, no clubbing Skin:  No rashes no nodules Neuro:  CN II through XII intact, motor grossly intact  DEVICE  Normal device function.  See PaceArt for details.   Assess/Plan:

## 2012-09-12 ENCOUNTER — Ambulatory Visit: Payer: BC Managed Care – PPO

## 2012-10-10 ENCOUNTER — Ambulatory Visit: Payer: BC Managed Care – PPO

## 2012-10-13 ENCOUNTER — Ambulatory Visit (INDEPENDENT_AMBULATORY_CARE_PROVIDER_SITE_OTHER): Payer: BC Managed Care – PPO

## 2012-10-13 DIAGNOSIS — D649 Anemia, unspecified: Secondary | ICD-10-CM | POA: Diagnosis not present

## 2012-10-15 DIAGNOSIS — D649 Anemia, unspecified: Secondary | ICD-10-CM | POA: Diagnosis not present

## 2012-10-15 MED ORDER — CYANOCOBALAMIN 1000 MCG/ML IJ SOLN
1000.0000 ug | Freq: Once | INTRAMUSCULAR | Status: AC
  Administered 2012-10-15: 1000 ug via INTRAMUSCULAR

## 2012-11-07 ENCOUNTER — Ambulatory Visit: Payer: BC Managed Care – PPO

## 2012-11-09 IMAGING — CT CT FOOT*R* W/O CM
3 of 5 series · 7 of 20 positions shown, 8 images · non-contrast
Comparison: None.

CLINICAL DATA: Medial mid foot pain.

CT OF THE RIGHT FOOT WITHOUT CONTRAST
TECHNIQUE: Multidetector CT imaging was performed according to the
standard protocol. Multiplanar CT image reconstructions were also
generated.

[Series 400: cor · axial · 0.59mm/px · z∈[-81,-42]mm · 2 of 66 slices shown, 3 images]
[im 22/66  soft-tissue]
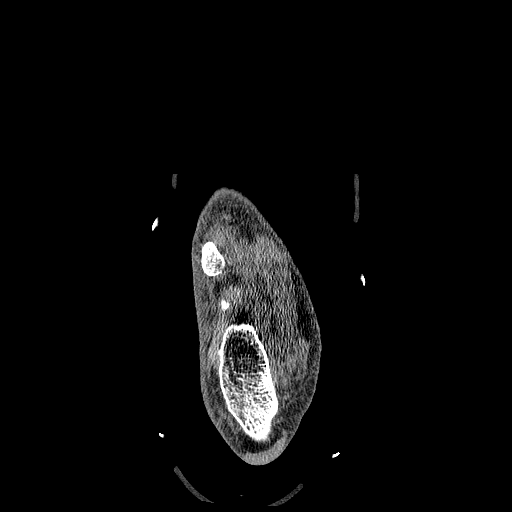
[im 22/66  bone]
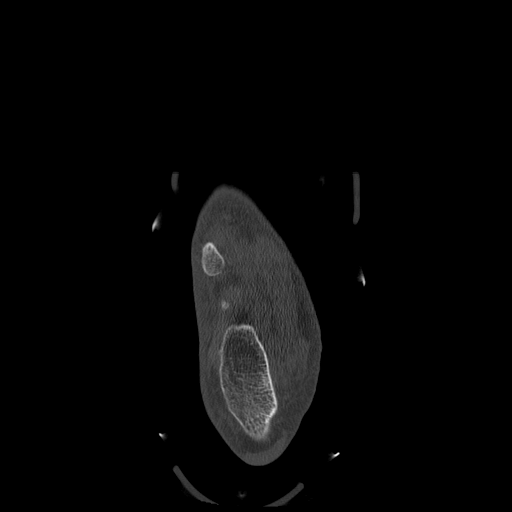
[im 44/66  bone]
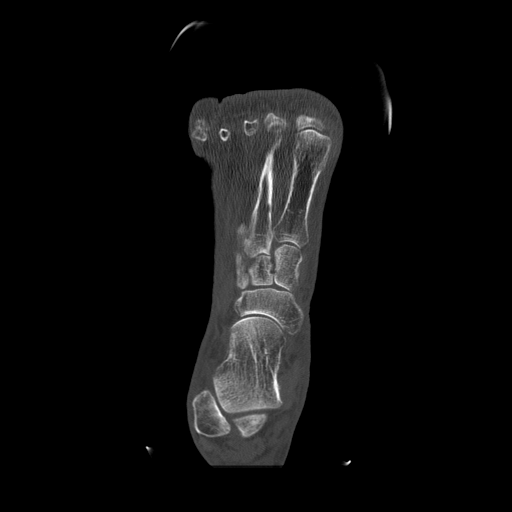

[Series 401: sag · sagittal · 0.59mm/px · 2 of 60 slices shown]
[im 20/60  bone]
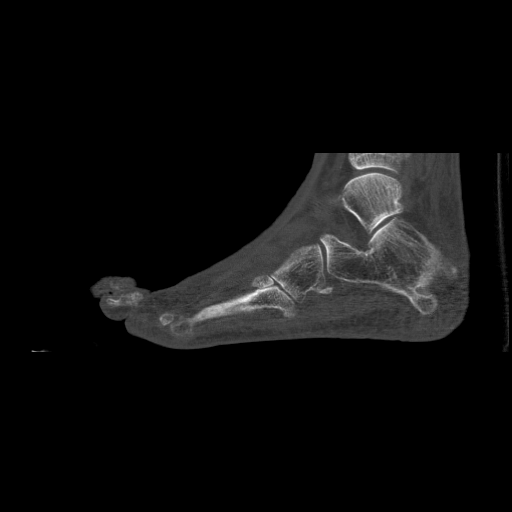
[im 40/60  bone]
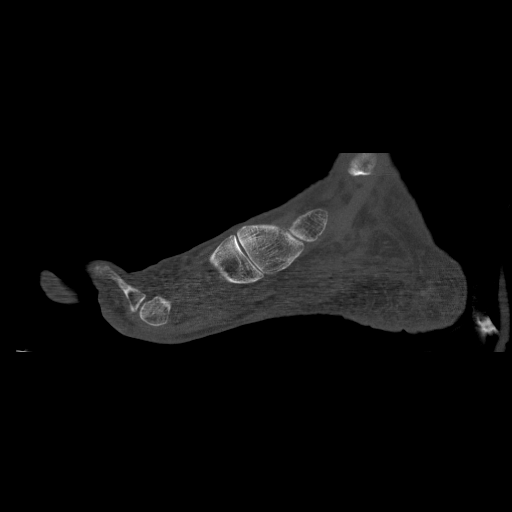

[Series 402: axial · coronal · 0.59mm/px · 3 of 124 slices shown]
[im 36/124  bone]
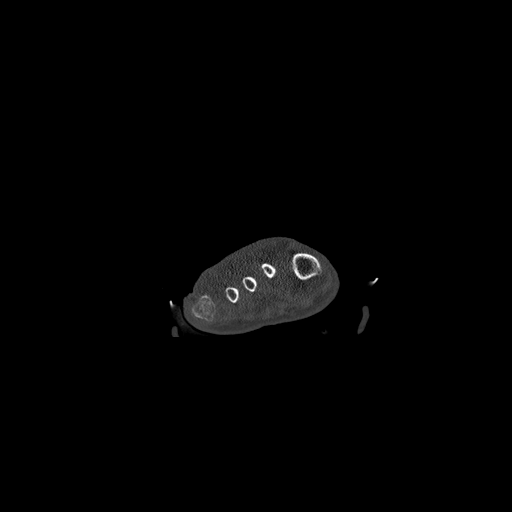
[im 53/124  bone]
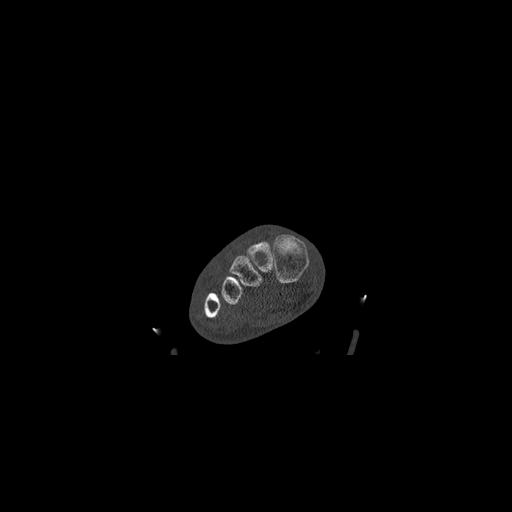
[im 71/124  bone]
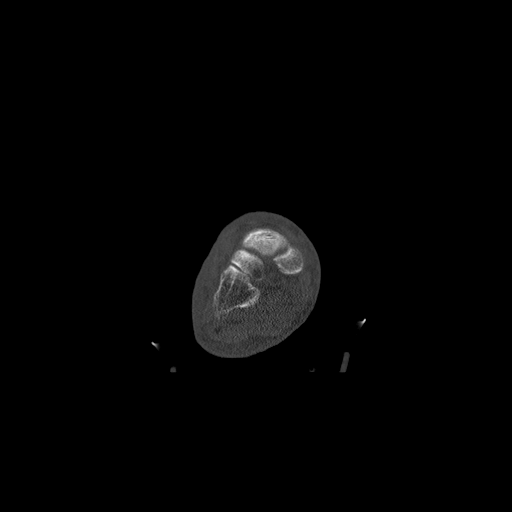

[7 of 20 positions shown; findings below may reference images not displayed]

FINDINGS: There is no evidence of fracture or periosteal reaction
to indicate a stress fracture.

The patient has slight subchondral cyst formation in the dorsal
aspect of the lateral cuneiform at the articulation with the
navicular.  No other significant arthritic changes in the foot.
There is a small plantar calcaneal spur with a few calcifications
in the proximal plantar fascia.  No appreciable abnormalities of
the tendons around the ankle.  No ankle or subtalar joint effusion
is appreciable.  Achilles tendon appears normal.
IMPRESSION: Slight arthritic changes at the dorsal aspect of the proximal
articular surface of the lateral cuneiform.  Otherwise, normal
exam.

## 2012-11-10 ENCOUNTER — Ambulatory Visit (INDEPENDENT_AMBULATORY_CARE_PROVIDER_SITE_OTHER): Payer: BC Managed Care – PPO

## 2012-11-10 DIAGNOSIS — D649 Anemia, unspecified: Secondary | ICD-10-CM | POA: Diagnosis not present

## 2012-11-10 MED ORDER — CYANOCOBALAMIN 1000 MCG/ML IJ SOLN
1000.0000 ug | Freq: Once | INTRAMUSCULAR | Status: AC
  Administered 2012-11-10: 1000 ug via INTRAMUSCULAR

## 2012-12-05 ENCOUNTER — Ambulatory Visit (INDEPENDENT_AMBULATORY_CARE_PROVIDER_SITE_OTHER): Payer: BC Managed Care – PPO | Admitting: Adult Health

## 2012-12-05 ENCOUNTER — Encounter: Payer: Self-pay | Admitting: Adult Health

## 2012-12-05 ENCOUNTER — Other Ambulatory Visit (INDEPENDENT_AMBULATORY_CARE_PROVIDER_SITE_OTHER): Payer: BC Managed Care – PPO

## 2012-12-05 VITALS — BP 106/60 | HR 50 | Temp 97.7°F | Ht 72.0 in | Wt 172.6 lb

## 2012-12-05 DIAGNOSIS — I10 Essential (primary) hypertension: Secondary | ICD-10-CM

## 2012-12-05 LAB — CBC WITH DIFFERENTIAL/PLATELET
Basophils Absolute: 0 10*3/uL (ref 0.0–0.1)
Lymphocytes Relative: 20.4 % (ref 12.0–46.0)
Lymphs Abs: 1.3 10*3/uL (ref 0.7–4.0)
Monocytes Relative: 10 % (ref 3.0–12.0)
Platelets: 183 10*3/uL (ref 150.0–400.0)
RDW: 12.9 % (ref 11.5–14.6)

## 2012-12-05 NOTE — Progress Notes (Signed)
Subjective:    Patient ID: [name], male    DOB: 07/18/1932, 79 y.o.   MRN: [id number]  HPI 79 y/o WM   ~  September 05, 2010:  another good 71mo- now working three 12H shifts per week & denies CP, palpit, SOB, etc... he tells me he also walks regularly & notes that he's good for about 1/2 mile then his jaw starts to hurt & he has to stop... he saw DrTaylor 9/11- OK w/ new generator for his AICD;  he saw [facility] 10/11 & f/u 2DEcho showed mild LVH, decr LVF w/ EF= 35-40% & mild HK, gr1DD, mild AS & MR, mild LAdil ==> no change in meds... BP controlled, Chol looks good on 1/2 tab, he still gets monthly B12 shots...   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  ~  September 07, 2011:  71mo ROV & he reports a good interval w/o new complaints or concerns;  Requests B12 shot today & refill all meds for 90d supplies...    BP remains controlled on Metoprolol & Ramipril;  Cardiac stable on ASA/ Plavix & he denies CP, palpit, dizzy, syncope, SOB, or edema;  Followed by Delton See for Cards (seen 10/12 doing satis, no changes made) & DrTaylor for EP- AICD is functioning normally as of last check;  FLP looks good on his Simva40, Lopid600, & FishOil;  CBC is wnl on his B12 shots monthly...  ~  March 06, 2012:  71mo ROV & [name] continues stable, still working part time, & his CC is heel pain/ plantar fasciitis> he has seen Ortho/ Podiatry & we reviewed hot soaks, stretching exercises, etc...     He saw DrTaylor for Cards 2/13> ischemic cardiomyopathy, heart block w/ ICD (generator change ~7yr ago), class 1-2 symptoms- doing well & no changes made...    We reviewed prob list,  meds, xrays and labs> see below for updates >> LABS 7/13:  FLP- ok x LDL=110 on Simva40;  LFTs- wnl...  ~  September 10, 2012:  71mo ROV & [name] continues to do well- no new complaints or concerns... We reviewed the following medical problems during today's office visit >>     HBP> on MetopER100, Altace10;  BP= 148/60 & he denies CP, palpit, dizzy, SOB, edema...    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    Hx Syncope, AV block, Implanted Defib> followed by DrTaylor w/ pacer checks every 2-63mo he says; generator changed 6/11; he denies CP/angina, dizzy/ syncope, etc...    Chol> on Simva40, Lopid600, FishOil; last FLP 7/13 showed TChol 172, TG 70, HDL 48, LDL 110    GI- GERD> he had LapChole 2006; he notes some constip & uses Benefiber which helps...    DJD> he just uses Tylenol as needed...    VitB12 defic> on B12 shots monthly... We reviewed prob list, meds,  xrays and labs> see below for updates >> he had the 2013 Flu vaccine 10/13; he requests 90d refills...  12/05/2012 Acute OV  Comes in for elevated b/p in am. Noticed b/p was elevated 170/75 3 days ago. Had slight nose bleed that same day.  No visual/speech changes, no arm weakness, no headache.  Feels good. Worried that his b/p is elevated sometimes in am but after he takes meds b/p goes down.  B/p today is 106/60.  No chest pain or edema Denies decongestant use.  Takes b/p meds in am.            Problem List:  HYPERTENSION (ICD-401.9) - controlled on TOPROL XL 100mg /d & ALTACE 10mg /d... takes meds regularly and tolerates well...  ~  7/12: BP= 120/68 and OK at home per pt; he gets rare HA which he believes is related to his BP; denies fatigue, visual changes, CP, palipit, dizziness, syncope, dyspnea, edema, etc... ~  CXR 9/12 showed dual  lead pacer/ AICD, norm heart size, clear lungs, NAD... ~  2/13:  BP= 124/68 & he remains asymptomatic... ~  8/13:  BP= 122/58 & he denies CP, palpit, SOB, edema, etc... ~  2/14: on MetopER100, Altace10;  BP= 148/60 & he denies CP, palpit, dizzy, SOB, edema.  CAD (ICD-414.00) - on ASA 81mg /d & PLAVIX 75mg /d> followed by Delton See... ISCHEMIC CARDIOMYOPATHY (ICD-414.8) - he's had prev MI w/ mult caths/ interventions. CHRONIC SYSTOLIC HEART FAILURE (ICD-428.22) AORTIC STENOSIS (ICD-424.1) > Mild by 2DEcho 10/11 MITRAL REGURGITATION (ICD-396.3) ~  last cath 5/08 showed norm Lmain, patent stent in prox LAD, 80-90% lesion in mid-LAD w/ PCI & stent placed, & 50% ostial stenosis of 1st diagonal branch of LAD; 40%mid & 70%distal CIRC; 50%prox 40%mid 40%distal RCA; Global HK w/EF=30%. ~  NuclearStressTest 4/09 showed infer scar, no ischemia, EF=40%. ~  2DEcho 10/11 showed mild LVH, decr LVF w/ EF= 35-40% & mild HK, gr1DD, mild AS & MR, mild LAdil. ~  EKG 10/12 showed pacer rhythm... 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   Hx of SYNCOPE (ICD-780.2) AV BLOCK, COMPLETE (ICD-426.0) AUTOMATIC IMPLANTABLE CARDIAC DEFIBRILLATOR SITU (ICD-V45.02) ~  Pacer/ ICD per DrTaylor w/ several adjustments in 2008... ~  5/11:  f/u DrTaylor w/ decision for elective generator change> done 6/11 & tol well. ~  He continues to f/u w/ DrTaylor every 6 months... ~  2/13: f/u DrTaylor doing satis, device working normally, no changes made & f/u 4yr... ~  2/14: Hx Syncope, AV block, Implanted Defib> followed by DrTaylor w/ pacer checks every 2-13mo he says; generator changed 6/11; he denies CP/angina, dizzy/ syncope, etc.  HYPERCHOLESTEROLEMIA (ICD-272.0) - takes SIMVASTATIN 40mg /d, GEMFIBRIZOL 600mg /d, & FISH OIL Bid... ~   FLP 08/14/07 on Simva40 showed TChol 163, TG 94, HDL 34, LDL 111... rec incr Simvastatin to 80mg /d. ~  FLP 02/24/08 on Simva80 showed TChol 136, TG 92, HDL 38, LDL 80... continue same. ~  FLP 1/10 showed TChol 125, TG 67, HDL 32, LDL 80... rec- continue same. ~  FLP 7/10 showed TChol 124, TG 86, HDL 36, LDL 71 ~  FLP 1/11 on Simva80+Gemfib600 showed TChol 137, TG 99, HDL 39, LDL 78 ~  FLP 8/11 on Simva80+Gemfib600 showed TChol 134, TG 82, HDL 36, LDL 82... Simva decr to  40. ~  FLP 1/12 on Simva40+Gemfib600 showed TChol 157, TG 139, HDL 38, LDL 92 ~  FLP 7/12 on Simva40+Gemfib600 showed TChol 163, TG 202, HDL 43, LDL 96... Needs better low fat diet. ~  FLP 1/13 on Simva40+Gemfib600 showed TChol 149, TG 50, HDL 39, LDL 100 ~  FLP 7/13 on Simva40+Gemfib600 showed TChol 172, TG 70, HDL 48, LDL 110  GERD (ICD-530.81) - he had ERCP in 8/06 w/ norm ducts, +gallstones, s/p lap chole 2006... he uses OTC meds Prn... ~  he notes that he had a colonoscopy in the past from DrPatterson, & he reports that it was neg.  DEGENERATIVE JOINT DISEASE (ICD-715.90) - he tried Mobic but it bothered his stomach, therefore uses Tylenol Prn.  VITAMIN B12 DEFICIENCY (ICD-266.2) - old chart not available- ? details of this Dx... on B12 shots xyrs. ~  labs 1/09 showed Hg=12.9 ~  labs 1/10 showed Hg= 12.7 ~  labs 1/11 showed Hg= 12.6 ~  labs 1/12 showed Hg= 13.2 ~  Labs 1/13 showed Hg= 12.6  Health Maintenance: ~  GI:  Followed by DrPatterson ~  GU:  PSA checked yearly & remains wnl... ~  Immunizations:  he gets the yearly Flu vaccine;  [place] he says;  given Pneumovax 1/11 age 38.   Past Surgical History  Procedure Laterality Date  . Icd implanted  2005  . Laparoscopic cholecystectomy  04/2005    Dr. Ezzard Standing  . Cardiac catheterization  2008    Outpatient Encounter Prescriptions as of 12/05/2012  Medication Sig Dispense Refill  . aspirin 81 MG tablet Take 81 mg by mouth daily.         . clopidogrel (PLAVIX) 75 MG tablet Take 1 tablet (75 mg total) by mouth daily.  90 tablet  4  . cyanocobalamin (,VITAMIN B-12,) 1000 MCG/ML injection Inject 1,000 mcg into the muscle every 30 (thirty) days.        . fish oil-omega-3 fatty acids 1000 MG capsule Take 2 g by mouth daily.        Marland Kitchen gemfibrozil (LOPID) 600 MG tablet Take 1 tablet (600 mg total) by mouth daily.  90 tablet  4  . metoprolol succinate (TOPROL-XL) 100 MG 24 hr tablet Take 1 tablet (100 mg total) by mouth daily.  90 tablet  4  . Multiple Vitamins-Minerals (MENS MULTIVITAMIN PLUS PO) Take 1 tablet by mouth daily.        . ramipril (ALTACE) 10 MG capsule Take 1 capsule (10 mg total) by mouth daily.  90 capsule  4  . simvastatin (ZOCOR) 40 MG tablet Take 1 tablet (40 mg total) by mouth every evening.  90 tablet  3   No facility-administered encounter medications on file as of 12/05/2012.    No Known Allergies   Current Medications, Allergies, Past Medical History, Past Surgical History, Family History, and Social History were reviewed in [employer] record.    Review of Systems        See HPI - all other systems neg except as noted...  The patient complains of dyspnea on exertion.  The patient denies anorexia, fever, weight loss, weight gain, vision loss, decreased hearing, hoarseness, chest pain, syncope, peripheral edema, prolonged cough, headaches, hemoptysis, abdominal pain, melena, hematochezia, severe indigestion/heartburn, hematuria, incontinence, muscle weakness, suspicious skin lesions, transient blindness, difficulty walking, depression, unusual weight change, abnormal bleeding, enlarged lymph nodes, and angioedema.     Objective:   Physical Exam  WD, WN, 79 y/o WM in NAD  GENERAL:  Alert & oriented; pleasant & cooperative... HEENT:  [place]/AT, EOM-wnl, PERRLA, EACs-clear, TMs-wnl, NOSE-clear without bleeding noted , THROAT-clear & wnl. NECK:  Supple w/ fairROM; no JVD; normal  carotid impulses w/o bruits; no thyromegaly or nodules palpated; no lymphadenopathy. CHEST:  Clear to P & A; without wheezes/ rales/ or rhonchi heard... HEART:  Pacer in L upper chest area~ nontender, RR, gr 1/6 SEM without rubs or gallops detected... ABDOMEN:  Soft & nontender; normal bowel sounds; no organomegaly or masses palpated... EXT: without deformities, mild arthritic changes; no varicose veins/ venous insuffic/ or edema. NEURO:  CN's intact; no focal neuro deficits apprec... DERM:  No lesions noted; no rash etc...  RADIOLOGY DATA:  Reviewed in the EPIC EMR & discussed w/ the patient...   LABORATORY DATA:  Reviewed in the EPIC EMR & discussed w/ the patient...     Assessment & Plan:

## 2012-12-05 NOTE — Patient Instructions (Addendum)
Change Ramipril 10mg  At bedtime   Continue on Metoprolol 100mg  in am.  Low salt diet  Claritin 10mg   daily As needed  Drainage.  Saline nasal  Rinses  As needed   Please contact office for sooner follow up if symptoms do not improve or worsen or seek emergency care  Follow up with with Dr. Kriste Basque  As planned and As needed

## 2012-12-08 ENCOUNTER — Ambulatory Visit (INDEPENDENT_AMBULATORY_CARE_PROVIDER_SITE_OTHER): Payer: BC Managed Care – PPO | Admitting: *Deleted

## 2012-12-08 ENCOUNTER — Other Ambulatory Visit: Payer: Self-pay | Admitting: Internal Medicine

## 2012-12-08 ENCOUNTER — Encounter: Payer: Self-pay | Admitting: Internal Medicine

## 2012-12-08 DIAGNOSIS — I428 Other cardiomyopathies: Secondary | ICD-10-CM | POA: Diagnosis not present

## 2012-12-08 DIAGNOSIS — I5022 Chronic systolic (congestive) heart failure: Secondary | ICD-10-CM

## 2012-12-08 DIAGNOSIS — Z95810 Presence of automatic (implantable) cardiac defibrillator: Secondary | ICD-10-CM | POA: Diagnosis not present

## 2012-12-08 DIAGNOSIS — I429 Cardiomyopathy, unspecified: Secondary | ICD-10-CM

## 2012-12-08 LAB — REMOTE ICD DEVICE
AL AMPLITUDE: 3.9 mv
BAMS-0003: 70 {beats}/min
BRDY-0002RV: 50 {beats}/min
BRDY-0003RV: 120 {beats}/min
BRDY-0004RV: 120 {beats}/min
MODE SWITCH EPISODES: 1
RV LEAD THRESHOLD: 0.875 V
TZAT-0012SLOWVT: 200 ms
TZAT-0013SLOWVT: 4
TZAT-0018SLOWVT: NEGATIVE
TZAT-0019SLOWVT: 7.5 V
TZAT-0020SLOWVT: 1 ms
TZON-0003SLOWVT: 375 ms
TZON-0004SLOWVT: 12
TZON-0005SLOWVT: 6
TZST-0001SLOWVT: 2
TZST-0001SLOWVT: 4
TZST-0003SLOWVT: 36 J

## 2012-12-08 LAB — BASIC METABOLIC PANEL
CO2: 30 mEq/L (ref 19–32)
Calcium: 9.3 mg/dL (ref 8.4–10.5)
Sodium: 138 mEq/L (ref 135–145)

## 2012-12-10 NOTE — Assessment & Plan Note (Addendum)
?   Elevated blood pressure in am.  Today's blood pressure is well controlled  Advised to bring home machine to next office visit.  Will split blood pressure med timing to see if this helps.  Check labs today    Plan  Change Ramipril 10mg  At bedtime   Continue on Metoprolol 100mg  in am.  Low salt diet  Claritin 10mg   daily As needed  Drainage.  Saline nasal  Rinses  As needed   Please contact office for sooner follow up if symptoms do not improve or worsen or seek emergency care  Follow up with with Dr. Kriste Basque  As planned and As needed

## 2012-12-11 NOTE — Progress Notes (Signed)
Quick Note:  Called spoke with patient, advised of lab results / recs as stated by TP. Pt verbalized his understanding and denied any questions. ______ 

## 2012-12-12 ENCOUNTER — Ambulatory Visit (INDEPENDENT_AMBULATORY_CARE_PROVIDER_SITE_OTHER): Payer: BC Managed Care – PPO

## 2012-12-12 DIAGNOSIS — D649 Anemia, unspecified: Secondary | ICD-10-CM | POA: Diagnosis not present

## 2012-12-12 MED ORDER — CYANOCOBALAMIN 1000 MCG/ML IJ SOLN
1000.0000 ug | Freq: Once | INTRAMUSCULAR | Status: AC
  Administered 2012-12-12: 1000 ug via INTRAMUSCULAR

## 2012-12-25 ENCOUNTER — Encounter: Payer: Self-pay | Admitting: *Deleted

## 2013-01-09 ENCOUNTER — Ambulatory Visit (INDEPENDENT_AMBULATORY_CARE_PROVIDER_SITE_OTHER): Payer: BC Managed Care – PPO

## 2013-01-09 DIAGNOSIS — D649 Anemia, unspecified: Secondary | ICD-10-CM | POA: Diagnosis not present

## 2013-01-12 MED ORDER — CYANOCOBALAMIN 1000 MCG/ML IJ SOLN
1000.0000 ug | Freq: Once | INTRAMUSCULAR | Status: AC
  Administered 2013-01-12: 1000 ug via INTRAMUSCULAR

## 2013-02-09 ENCOUNTER — Ambulatory Visit (INDEPENDENT_AMBULATORY_CARE_PROVIDER_SITE_OTHER): Payer: BC Managed Care – PPO

## 2013-02-09 DIAGNOSIS — D649 Anemia, unspecified: Secondary | ICD-10-CM | POA: Diagnosis not present

## 2013-02-11 MED ORDER — CYANOCOBALAMIN 1000 MCG/ML IJ SOLN
1000.0000 ug | Freq: Once | INTRAMUSCULAR | Status: AC
  Administered 2013-02-11: 1000 ug via INTRAMUSCULAR

## 2013-02-23 ENCOUNTER — Telehealth: Payer: Self-pay | Admitting: Pulmonary Disease

## 2013-02-23 DIAGNOSIS — E785 Hyperlipidemia, unspecified: Secondary | ICD-10-CM

## 2013-02-23 NOTE — Telephone Encounter (Signed)
Spoke with patient-aware that I am sending to SN and Leigh to advise and once labs are in EPIC we will call patient to let him know. Pt would like to come in the AM of 03-06-13. Thanks.

## 2013-02-24 NOTE — Telephone Encounter (Signed)
Order placed and pt is aware. [name], CMA  

## 2013-02-24 NOTE — Telephone Encounter (Signed)
Per SN---labs were good 08/2012 and 12/2012 just will need LIP now.  thanks

## 2013-03-05 ENCOUNTER — Other Ambulatory Visit (INDEPENDENT_AMBULATORY_CARE_PROVIDER_SITE_OTHER): Payer: BC Managed Care – PPO

## 2013-03-05 DIAGNOSIS — E785 Hyperlipidemia, unspecified: Secondary | ICD-10-CM

## 2013-03-05 LAB — LIPID PANEL
Cholesterol: 171 mg/dL (ref 0–200)
HDL: 46.7 mg/dL (ref >39.00)
VLDL: 20 mg/dL (ref 0.0–40.0)

## 2013-03-09 ENCOUNTER — Ambulatory Visit (INDEPENDENT_AMBULATORY_CARE_PROVIDER_SITE_OTHER): Payer: BC Managed Care – PPO | Admitting: *Deleted

## 2013-03-09 ENCOUNTER — Encounter: Payer: Self-pay | Admitting: Internal Medicine

## 2013-03-09 DIAGNOSIS — I428 Other cardiomyopathies: Secondary | ICD-10-CM | POA: Diagnosis not present

## 2013-03-09 DIAGNOSIS — I5022 Chronic systolic (congestive) heart failure: Secondary | ICD-10-CM | POA: Diagnosis not present

## 2013-03-09 DIAGNOSIS — I429 Cardiomyopathy, unspecified: Secondary | ICD-10-CM

## 2013-03-09 DIAGNOSIS — Z95810 Presence of automatic (implantable) cardiac defibrillator: Secondary | ICD-10-CM | POA: Diagnosis not present

## 2013-03-10 LAB — REMOTE ICD DEVICE
AL AMPLITUDE: 2.2 mv
AL IMPEDENCE ICD: 450 Ohm
ATRIAL PACING ICD: 36 pct
BAMS-0001: 150 {beats}/min
BAMS-0003: 70 {beats}/min
BRDY-0002RV: 50 {beats}/min
BRDY-0003RV: 120 {beats}/min
DEVICE MODEL ICD: 610170
RV LEAD IMPEDENCE ICD: 380 Ohm
TZAT-0004SLOWVT: 8
TZAT-0012SLOWVT: 200 ms
TZAT-0013SLOWVT: 4
TZAT-0018SLOWVT: NEGATIVE
TZAT-0019SLOWVT: 7.5 V
TZAT-0020SLOWVT: 1 ms
TZON-0003SLOWVT: 375 ms
TZON-0004SLOWVT: 12
TZON-0005SLOWVT: 6
TZST-0001SLOWVT: 3
TZST-0003SLOWVT: 36 J

## 2013-03-11 ENCOUNTER — Ambulatory Visit (INDEPENDENT_AMBULATORY_CARE_PROVIDER_SITE_OTHER): Payer: BC Managed Care – PPO | Admitting: Pulmonary Disease

## 2013-03-11 ENCOUNTER — Encounter: Payer: Self-pay | Admitting: Pulmonary Disease

## 2013-03-11 VITALS — BP 120/70 | HR 50 | Temp 98.2°F | Ht 71.0 in | Wt 169.2 lb

## 2013-03-11 DIAGNOSIS — I350 Nonrheumatic aortic (valve) stenosis: Secondary | ICD-10-CM

## 2013-03-11 DIAGNOSIS — I10 Essential (primary) hypertension: Secondary | ICD-10-CM | POA: Diagnosis not present

## 2013-03-11 DIAGNOSIS — I340 Nonrheumatic mitral (valve) insufficiency: Secondary | ICD-10-CM

## 2013-03-11 DIAGNOSIS — I359 Nonrheumatic aortic valve disorder, unspecified: Secondary | ICD-10-CM | POA: Diagnosis not present

## 2013-03-11 DIAGNOSIS — E538 Deficiency of other specified B group vitamins: Secondary | ICD-10-CM | POA: Diagnosis not present

## 2013-03-11 DIAGNOSIS — I428 Other cardiomyopathies: Secondary | ICD-10-CM

## 2013-03-11 DIAGNOSIS — I2510 Atherosclerotic heart disease of native coronary artery without angina pectoris: Secondary | ICD-10-CM | POA: Diagnosis not present

## 2013-03-11 DIAGNOSIS — I059 Rheumatic mitral valve disease, unspecified: Secondary | ICD-10-CM

## 2013-03-11 DIAGNOSIS — M1990 Unspecified osteoarthritis, unspecified site: Secondary | ICD-10-CM

## 2013-03-11 DIAGNOSIS — E785 Hyperlipidemia, unspecified: Secondary | ICD-10-CM

## 2013-03-11 DIAGNOSIS — I429 Cardiomyopathy, unspecified: Secondary | ICD-10-CM

## 2013-03-11 MED ORDER — CYANOCOBALAMIN 1000 MCG/ML IJ SOLN
1000.0000 ug | Freq: Once | INTRAMUSCULAR | Status: AC
  Administered 2013-03-11: 1000 ug via INTRAMUSCULAR

## 2013-03-11 NOTE — Patient Instructions (Addendum)
Today we updated your med list in our EPIC system...    Continue your current medications the same...  We reviewed your recent lipid profile 7 your numbers look ok on the Simvastatin & Gemfibrifol; continue same...  Call for any questions...   Let's plan a follow up visit in 40mo, sooner if needed for problems.Marland KitchenMarland Kitchen

## 2013-03-16 ENCOUNTER — Encounter: Payer: Self-pay | Admitting: Pulmonary Disease

## 2013-03-16 NOTE — Progress Notes (Signed)
Subjective:    Patient ID: [name], male    DOB: 04/19/1932, 79 y.o.   MRN: [id number]  HPI 79 y/o WM here for a follow up visit & review of mult medical problems... 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  ~  September 05, 2010:  another good 52mo- now working three 12H shifts per week & denies CP, palpit, SOB, etc... he tells me he also walks regularly & notes that he's good for about 1/2 mile then his jaw starts to hurt & he has to stop... he saw DrTaylor 9/11- OK w/ new generator for his AICD;  he saw [facility] 10/11 & f/u 2DEcho showed mild LVH, decr LVF w/ EF= 35-40% & mild HK, gr1DD, mild AS & MR, mild LAdil ==> no change in meds... BP controlled, Chol looks good on 1/2 tab, he still gets monthly B12 shots...   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  ~  September 07, 2011:  52mo ROV & he reports a good interval w/o new complaints or concerns;  Requests B12 shot today & refill all meds for 90d supplies...    BP remains controlled on Metoprolol & Ramipril;  Cardiac stable on ASA/ Plavix & he denies CP, palpit, dizzy, syncope, SOB, or edema;  Followed by Delton See for Cards (seen 10/12 doing satis, no changes made) & DrTaylor for EP- AICD is functioning normally as of last check;  FLP looks good on his Simva40, Lopid600, & FishOil;  CBC is wnl on his B12 shots monthly...  ~  March 06, 2012:  52mo ROV & [name] continues stable, still working part time, & his CC is heel pain/ plantar fasciitis> he has seen Ortho/ Podiatry & we reviewed hot soaks, stretching exercises, etc...   He saw DrTaylor for Cards 2/13> ischemic cardiomyopathy, heart block w/ ICD (generator change ~77yr ago), class 1-2 symptoms- doing well & no changes made...    We reviewed prob list, meds, xrays and labs> see below for updates >> LABS 7/13:  FLP- ok x LDL=110 on Simva40;  LFTs- wnl...  ~  September 10, 2012:  52mo ROV & [name] continues to do well- no new complaints or concerns... We reviewed the following medical problems during today's office visit >>     HBP> on MetopER100, Altace10;  BP= 148/60 & he denies CP, palpit, dizzy, SOB, edema...    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    Hx Syncope, AV block, Implanted Defib> followed by DrTaylor w/ pacer checks every 2-78mo he says; generator changed 6/11; he denies CP/angina, dizzy/ syncope, etc...    Chol> on Simva40, Lopid600, FishOil; last FLP 1/14 showed TChol 152, TG 74, HDL 38, LDL 99    GI- GERD> he had LapChole 2006; he notes some  constip & uses Benefiber which helps...    DJD> he just uses Tylenol as needed...    VitB12 defic> on B12 shots monthly... We reviewed prob list, meds, xrays and labs> see below for updates >> he had the 2013 Flu vaccine 10/13; he requests 90d refills...  ~  March 11, 2013:  19mo ROV & [name] continues to do well- no new complaints or concerns;  We reviewed the following medical problems during today's office visit >>     HBP> on MetopER100AM, Altace10PM;  BP= 120/70 & he denies CP, palpit, dizzy, SOB, edema...    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    Hx Syncope, AV block,  Implanted Defib> followed by DrTaylor (seen 2/14 w/ his StJude biventricICD working normally) w/ pacer checks every 2-13mo he says; generator changed 6/11; he denies CP/angina, dizzy/ syncope, etc...    Chol> on Simva40, Lopid600, FishOil; FLP 7/14 shows TChol 171, TG 100, HDL 47, LDL 104    GI- GERD> he had LapChole 2006; he notes some constip & uses Benefiber which helps; cannot find reference to prev colonoscopy in Epic or [facility] but he says neg colon by DrPatterson in the past...    DJD> he just uses Tylenol as needed...    VitB12 defic> on B12 shots monthly... We reviewed prob list, meds, xrays and labs> see below for updates >>            Problem List:  HYPERTENSION (ICD-401.9) - controlled on TOPROL XL 100mg /d & ALTACE 10mg /d... takes meds regularly and tolerates well...  ~  7/12: BP= 120/68 and OK at home per pt; he gets rare HA which he believes is related to his BP; denies fatigue, visual changes, CP, palipit, dizziness, syncope, dyspnea, edema, etc... ~  CXR 9/12 showed dual lead pacer/ AICD, norm heart size, clear lungs, NAD... ~  2/13:  BP= 124/68 & he remains asymptomatic... ~  8/13:  BP= 122/58 & he denies CP, palpit, SOB, edema, etc... ~  2/14: on MetopER100, Altace10;  BP= 148/60 & he denies CP, palpit, dizzy, SOB, edema. ~  8/14: on MetopER100 in AM, Altace10 in PM;  BP= 120/70 & he denies CP, palpit, dizzy, SOB, edema.  CAD (ICD-414.00) - on ASA 81mg /d & PLAVIX 75mg /d> followed by Delton See... ISCHEMIC CARDIOMYOPATHY (ICD-414.8) - he's had prev MI w/ mult caths/ interventions. CHRONIC SYSTOLIC HEART FAILURE (ICD-428.22) AORTIC STENOSIS (ICD-424.1) > Mild by 2DEcho 10/11 MITRAL REGURGITATION (ICD-396.3) ~  last cath 5/08 showed norm Lmain, patent stent in prox LAD, 80-90% lesion in mid-LAD w/ PCI & stent placed, & 50% ostial stenosis of 1st diagonal branch of LAD; 40%mid & 70%distal CIRC; 50%prox 40%mid 40%distal RCA; Global HK w/EF=30%. ~  NuclearStressTest 4/09 showed  infer scar, no ischemia, EF=40%. ~  2DEcho 10/11 showed mild LVH, decr LVF w/ EF= 35-40% & mild HK, gr1DD, mild AS & MR, mild LAdil. ~  EKG 10/12 showed pacer rhythm... 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   Hx of SYNCOPE (ICD-780.2)  AV BLOCK, COMPLETE (ICD-426.0) AUTOMATIC IMPLANTABLE CARDIAC DEFIBRILLATOR SITU (ICD-V45.02) ~  Pacer/ ICD per DrTaylor w/ several adjustments in 2008... ~  5/11:  f/u DrTaylor w/ decision for elective generator change> done 6/11 & tol well. ~  He continues to f/u w/ DrTaylor every 6 months... ~  2/13: f/u DrTaylor doing satis, device working normally, no changes made & f/u 49yr... ~  2/14: Hx Syncope, AV block, Implanted Defib> followed by DrTaylor w/ pacer checks every 2-68mo he says; generator changed 6/11; he denies CP/angina, dizzy/ syncope, etc.  HYPERCHOLESTEROLEMIA (ICD-272.0) - takes SIMVASTATIN 40mg /d, GEMFIBRIZOL 600mg /d, & FISH OIL Bid... ~  FLP 08/14/07 on Simva40 showed TChol 163, TG 94, HDL 34, LDL 111... rec incr Simvastatin to 80mg /d. ~  FLP 02/24/08 on Simva80 showed TChol 136, TG 92, HDL 38, LDL 80... continue same. ~  FLP 1/10 showed TChol 125, TG 67, HDL 32, LDL 80... rec- continue same. ~  FLP 7/10 showed TChol 124, TG 86, HDL 36, LDL 71 ~  FLP 1/11 on Simva80+Gemfib600 showed TChol 137, TG 99, HDL 39, LDL 78 ~  FLP 8/11 on Simva80+Gemfib600 showed TChol 134, TG 82, HDL 36, LDL 82... Simva decr to 40. ~  FLP 1/12 on Simva40+Gemfib600 showed TChol 157, TG 139, HDL 38, LDL 92 ~  FLP 7/12 on Simva40+Gemfib600 showed TChol 163, TG 202, HDL 43, LDL 96... Needs better low fat diet. ~  FLP 1/13 on Simva40+Gemfib600 showed TChol 149, TG 50, HDL 39, LDL 100 ~  FLP 7/13 on Simva40+Gemfib600 showed TChol 172, TG 70, HDL 48, LDL 110 ~  FLP 1/14 on  Simva40+Gemfib600 showed TChol 152, TG 74, HDL 38, LDL 99  ~  FLP 8/14 on Simva40+Gemfib600 showed TChol 171, TG 100, HDL 47, LDL 104  GERD (ICD-530.81) - he had ERCP in 8/06 w/ norm ducts, +gallstones, s/p lap chole 2006... he uses OTC meds Prn... ~  he notes that he had a colonoscopy in the past from DrPatterson, & he reports that it was neg.  DEGENERATIVE JOINT DISEASE (ICD-715.90) - he tried Mobic but it bothered his stomach, therefore uses Tylenol Prn.  VITAMIN B12 DEFICIENCY (ICD-266.2) - old chart not available- ? details of this Dx... on B12 shots xyrs. ~  labs 1/09 showed Hg=12.9 ~  labs 1/10 showed Hg= 12.7 ~  labs 1/11 showed Hg= 12.6 ~  labs 1/12 showed Hg= 13.2 ~  Labs 1/13 showed Hg= 12.6 ~  Labs 1/14 showed Hg= 12.7  Health Maintenance: ~  GI:  Followed by DrPatterson ~  GU:  PSA checked yearly & remains wnl... ~  Immunizations:  he gets the yearly Flu vaccine;  [place] he says;  given Pneumovax 1/11 age 60.   Past Surgical History  Procedure Laterality Date  . Icd implanted  2005  . Laparoscopic cholecystectomy  04/2005    Dr. Ezzard Standing  . Cardiac catheterization  2008    Outpatient Encounter Prescriptions as of 03/11/2013  Medication Sig Dispense Refill  . aspirin 81 MG tablet Take 81 mg by mouth daily.        . clopidogrel (PLAVIX) 75 MG tablet Take 1 tablet (75 mg total) by mouth daily.  90 tablet  4  . cyanocobalamin (,VITAMIN B-12,) 1000 MCG/ML injection Inject 1,000 mcg into the muscle every 30 (thirty) days.        . fish oil-omega-3 fatty acids 1000 MG capsule Take 2 g by mouth daily.        Marland Kitchen  gemfibrozil (LOPID) 600 MG tablet Take 1 tablet (600 mg total) by mouth daily.  90 tablet  4  . metoprolol succinate (TOPROL-XL) 100 MG 24 hr tablet Take 1 tablet (100 mg total) by mouth daily.  90 tablet  4  . Multiple Vitamins-Minerals (MENS MULTIVITAMIN PLUS PO) Take 1 tablet by mouth daily.        . ramipril (ALTACE) 10 MG capsule  Take 1 capsule (10 mg total) by mouth daily.  90 capsule  4  . simvastatin (ZOCOR) 40 MG tablet Take 1 tablet (40 mg total) by mouth every evening.  90 tablet  3  . [EXPIRED] cyanocobalamin ((VITAMIN B-12)) injection 1,000 mcg        No facility-administered encounter medications on file as of 03/11/2013.    No Known Allergies   Current Medications, Allergies, Past Medical History, Past Surgical History, Family History, and Social History were reviewed in [employer] record.    Review of Systems        See HPI - all other systems neg except as noted...  The patient complains of dyspnea on exertion.  The patient denies anorexia, fever, weight loss, weight gain, vision loss, decreased hearing, hoarseness, chest pain, syncope, peripheral edema, prolonged cough, headaches, hemoptysis, abdominal pain, melena, hematochezia, severe indigestion/heartburn, hematuria, incontinence, muscle weakness, suspicious skin lesions, transient blindness, difficulty walking, depression, unusual weight change, abnormal bleeding, enlarged lymph nodes, and angioedema.     Objective:   Physical Exam     WD, WN, 79 y/o WM in NAD... wt=170# ht=6'1" BMI=22... GENERAL:  Alert & oriented; pleasant & cooperative... HEENT:  [place]/AT, EOM-wnl, PERRLA, EACs-clear, TMs-wnl, NOSE-clear, THROAT-clear & wnl. NECK:  Supple w/ fairROM; no JVD; normal carotid impulses w/o bruits; no thyromegaly or nodules palpated; no lymphadenopathy. CHEST:  Clear to P & A; without wheezes/ rales/ or rhonchi heard... HEART:  Pacer in L upper chest area~ nontender, RR, gr 1/6 SEM without rubs or gallops detected... ABDOMEN:  Soft & nontender; normal bowel sounds; no organomegaly or masses palpated... EXT: without deformities, mild arthritic changes; no varicose veins/ venous insuffic/ or edema. NEURO:  CN's intact; no focal neuro deficits apprec... DERM:  No lesions noted; no rash etc...  RADIOLOGY DATA:  Reviewed in the  EPIC EMR & discussed w/ the patient...   LABORATORY DATA:  Reviewed in the EPIC EMR & discussed w/ the patient...     Assessment & Plan:    HBP>  Controlled on Metoprolol & Ramipril, continue same...  CAD, Cardiomyopathy, CHF, ValvHeartDis w/ AS & MR>  Followed by DrKatz/ Ladona Ridgel & stable on ASA/ Plavix, still working 3d per week etc...  Hx syncope, AVBlock, Pacer/AICD>  Followed by DrTaylor & stable, continue same...  LIPIDS>  FLP looks OK on Simva40, Gemfib 600mg /d, & Fish Oil...  DJD>  He uses Tylenol as needed; still working 3d per week...  B-12 defic>  He remains on B12 shots monthly...   Patient's Medications  New Prescriptions   No medications on file  Previous Medications   ASPIRIN 81 MG TABLET    Take 81 mg by mouth daily.     CLOPIDOGREL (PLAVIX) 75 MG TABLET    Take 1 tablet (75 mg total) by mouth daily.   CYANOCOBALAMIN (,VITAMIN B-12,) 1000 MCG/ML INJECTION    Inject 1,000 mcg into the muscle every 30 (thirty) days.     FISH OIL-OMEGA-3 FATTY ACIDS 1000 MG CAPSULE    Take 2 g by mouth daily.  GEMFIBROZIL (LOPID) 600 MG TABLET    Take 1 tablet (600 mg total) by mouth daily.   METOPROLOL SUCCINATE (TOPROL-XL) 100 MG 24 HR TABLET    Take 1 tablet (100 mg total) by mouth daily.   MULTIPLE VITAMINS-MINERALS (MENS MULTIVITAMIN PLUS PO)    Take 1 tablet by mouth daily.     RAMIPRIL (ALTACE) 10 MG CAPSULE    Take 1 capsule (10 mg total) by mouth daily.   SIMVASTATIN (ZOCOR) 40 MG TABLET    Take 1 tablet (40 mg total) by mouth every evening.  Modified Medications   No medications on file  Discontinued Medications   No medications on file

## 2013-03-27 ENCOUNTER — Encounter: Payer: Self-pay | Admitting: *Deleted

## 2013-04-08 DIAGNOSIS — H2510 Age-related nuclear cataract, unspecified eye: Secondary | ICD-10-CM | POA: Diagnosis not present

## 2013-04-15 ENCOUNTER — Ambulatory Visit (INDEPENDENT_AMBULATORY_CARE_PROVIDER_SITE_OTHER): Payer: BC Managed Care – PPO

## 2013-04-15 DIAGNOSIS — E538 Deficiency of other specified B group vitamins: Secondary | ICD-10-CM

## 2013-04-15 MED ORDER — CYANOCOBALAMIN 1000 MCG/ML IJ SOLN
1000.0000 ug | Freq: Once | INTRAMUSCULAR | Status: AC
  Administered 2013-04-15: 1000 ug via INTRAMUSCULAR

## 2013-05-13 ENCOUNTER — Ambulatory Visit (INDEPENDENT_AMBULATORY_CARE_PROVIDER_SITE_OTHER): Payer: BC Managed Care – PPO

## 2013-05-13 DIAGNOSIS — E538 Deficiency of other specified B group vitamins: Secondary | ICD-10-CM | POA: Diagnosis not present

## 2013-05-14 MED ORDER — CYANOCOBALAMIN 1000 MCG/ML IJ SOLN
1000.0000 ug | Freq: Once | INTRAMUSCULAR | Status: AC
  Administered 2013-05-14: 1000 ug via INTRAMUSCULAR

## 2013-06-08 ENCOUNTER — Encounter: Payer: Self-pay | Admitting: Internal Medicine

## 2013-06-08 ENCOUNTER — Encounter: Payer: BC Managed Care – PPO | Admitting: *Deleted

## 2013-06-08 DIAGNOSIS — I429 Cardiomyopathy, unspecified: Secondary | ICD-10-CM

## 2013-06-08 DIAGNOSIS — Z95810 Presence of automatic (implantable) cardiac defibrillator: Secondary | ICD-10-CM

## 2013-06-08 DIAGNOSIS — I5022 Chronic systolic (congestive) heart failure: Secondary | ICD-10-CM

## 2013-06-08 LAB — REMOTE ICD DEVICE
AL IMPEDENCE ICD: 410 Ohm
BRDY-0002RA: 50 {beats}/min
BRDY-0004RA: 120 {beats}/min
MODE SWITCH EPISODES: 3
RV LEAD IMPEDENCE ICD: 360 Ohm
TZAT-0001SLOWVT: 1
TZAT-0018SLOWVT: NEGATIVE
TZAT-0019SLOWVT: 7.5 V
TZAT-0020SLOWVT: 1 ms
TZON-0003SLOWVT: 375 ms
TZON-0004SLOWVT: 12
TZON-0010SLOWVT: 40 ms
TZST-0001SLOWVT: 2
TZST-0001SLOWVT: 4
TZST-0003SLOWVT: 40 J

## 2013-06-10 ENCOUNTER — Ambulatory Visit (INDEPENDENT_AMBULATORY_CARE_PROVIDER_SITE_OTHER): Payer: BC Managed Care – PPO

## 2013-06-10 DIAGNOSIS — E538 Deficiency of other specified B group vitamins: Secondary | ICD-10-CM | POA: Diagnosis not present

## 2013-06-11 MED ORDER — CYANOCOBALAMIN 1000 MCG/ML IJ SOLN
1000.0000 ug | Freq: Once | INTRAMUSCULAR | Status: AC
  Administered 2013-06-11: 1000 ug via INTRAMUSCULAR

## 2013-06-12 ENCOUNTER — Encounter: Payer: Self-pay | Admitting: *Deleted

## 2013-07-07 ENCOUNTER — Ambulatory Visit (INDEPENDENT_AMBULATORY_CARE_PROVIDER_SITE_OTHER): Payer: BC Managed Care – PPO

## 2013-07-07 ENCOUNTER — Ambulatory Visit (INDEPENDENT_AMBULATORY_CARE_PROVIDER_SITE_OTHER): Payer: BC Managed Care – PPO | Admitting: Gastroenterology

## 2013-07-07 ENCOUNTER — Encounter: Payer: Self-pay | Admitting: Gastroenterology

## 2013-07-07 VITALS — BP 130/72 | HR 64 | Ht 71.0 in | Wt 166.2 lb

## 2013-07-07 DIAGNOSIS — K6289 Other specified diseases of anus and rectum: Secondary | ICD-10-CM

## 2013-07-07 DIAGNOSIS — K624 Stenosis of anus and rectum: Secondary | ICD-10-CM

## 2013-07-07 DIAGNOSIS — D649 Anemia, unspecified: Secondary | ICD-10-CM

## 2013-07-07 DIAGNOSIS — K5900 Constipation, unspecified: Secondary | ICD-10-CM | POA: Diagnosis not present

## 2013-07-07 MED ORDER — LINACLOTIDE 145 MCG PO CAPS: 145.0000 ug | ORAL_CAPSULE | Freq: Every day | ORAL | Status: DC

## 2013-07-07 MED ORDER — LACTULOSE 10 G PO PACK: 10.0000 g | PACK | Freq: Three times a day (TID) | ORAL | Status: DC

## 2013-07-07 MED ORDER — HYDROCORTISONE ACETATE 25 MG RE SUPP: 25.0000 mg | Freq: Every day | RECTAL | Status: DC

## 2013-07-07 NOTE — Progress Notes (Signed)
History of Present Illness:  This is a very complicated 79 year old occasional male with aortic stenosis, mitral regurgitation, coronary artery disease, systolic heart failure low ejection fraction, and a permanent defibrillator and pacemaker.  He comes in office today complaining of increasing constipation and straining with a bowel movement with some discomfort in his rectum but no rectal bleeding.  He also denies abdominal pain but does have some distention.  Is on multiple medications listed and reviewed.  He denies upper GI or hepatobiliary complaints.  He apparently had a colonoscopy in 2005 which was unremarkable, but I cannot find this report.  Family history is noncontributory.  He is tried multiple laxatives and ex-lax seems to work the best.  There is a previous history of a rectal fissure and some rectal stenosis.    I have reviewed this patient's present history, medical and surgical past history, allergies and medications.     ROS:   All systems were reviewed and are negative unless otherwise stated in the HPI.    Physical Exam: Blood pressure 130/72, pulse 64 and regular and weight 166 with a BMI of 23.19. General well developed well nourished patient in no acute distress, appearing their stated age Eyes PERRLA, no icterus, fundoscopic exam per opthamologist Skin no lesions noted Neck supple, no adenopathy, no thyroid enlargement, no tenderness Chest clear to percussion and auscultation Heart no significant murmurs, gallops or rubs noted Abdomen no hepatosplenomegaly masses or tenderness, BS normal.  Rectal inspection normal no fissures, or fistulae noted.  No masses or tenderness on digital exam.  There is rather severe anal stenosis with digital dilatation performed.  Endoscopy was also performed and showed a posterior medial and lateral internal hemorrhoids but no definite fissure or bleeding. Extremities no acute joint lesions, edema, phlebitis or evidence of  cellulitis. Neurologic patient oriented x 3, cranial nerves intact, no focal neurologic deficits noted. Psychological mental status normal and normal affect.  Assessment and plan:Rectal Outlet Constipation in an elderly male on multiple medications who has chronic systolic heart failure, chronic anticoagulation, and was a poor candidate for repeat colonoscopy.  I placed him on Linzess and 145 mcg a day, Chronulac 10 mg at bedtime, Colace 100 milligrams twice a day, Anusol-HC suppositories at bedtime.  Also ordered a stool Cologuard DNA exam for colon cancer.  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  He is already on daily Benefiber and to try and use liberal by mouth fluids.  Otherwise continue his multiple medications per cardiology and Dr. Kriste Basque in primary care.

## 2013-07-07 NOTE — Patient Instructions (Addendum)
We have given you samples of the following medication to take: Linzess 145 mcg, please take one capsule by mouth thirty minutes before breakfast  Lactulose 10 grams, please take one packet and pour in half glass of water and stir. Drink entire glass of water.(DRINK AT BEDTIME  We have sent the following medications to your pharmacy for you to pick up at your convenience: Anusol Suppositories, please place one suppository in your rectum at bedtime   Please purchase Colace 100 mg over the counter and take one tablet by mouth twice daily  Please stay on your Benefiber and do lots of fluids   You will receive a call regarding your stool test   _________________________________________________________________________________________________  Constipation, Adult Constipation is when a person has fewer than 3 bowel movements a week; has difficulty having a bowel movement; or has stools that are dry, hard, or larger than normal. As people grow older, constipation is more common. If you try to fix constipation with medicines that make you have a bowel movement (laxatives), the problem may get worse. Long-term laxative use may cause the muscles of the colon to become weak. A low-fiber diet, not taking in enough fluids, and taking certain medicines may make constipation worse. CAUSES   Certain medicines, such as antidepressants, pain medicine, iron supplements, antacids, and water pills.   Certain diseases, such as diabetes, irritable bowel syndrome (IBS), thyroid disease, or depression.   Not drinking enough water.   Not eating enough fiber-rich foods.   Stress or travel.  Lack of physical activity or exercise.  Not going to the restroom when there is the urge to have a bowel movement.  Ignoring the urge to have a bowel movement.  Using laxatives too much. SYMPTOMS   Having fewer than 3 bowel movements a week.   Straining to have a bowel movement.   Having hard, dry, or larger  than normal stools.   Feeling full or bloated.   Pain in the lower abdomen.  Not feeling relief after having a bowel movement. DIAGNOSIS  Your caregiver will take a medical history and perform a physical exam. Further testing may be done for severe constipation. Some tests may include:   A barium enema X-ray to examine your rectum, colon, and sometimes, your small intestine.  A sigmoidoscopy to examine your lower colon.  A colonoscopy to examine your entire colon. TREATMENT  Treatment will depend on the severity of your constipation and what is causing it. Some dietary treatments include drinking more fluids and eating more fiber-rich foods. Lifestyle treatments may include regular exercise. If these diet and lifestyle recommendations do not help, your caregiver may recommend taking over-the-counter laxative medicines to help you have bowel movements. Prescription medicines may be prescribed if over-the-counter medicines do not work.  HOME CARE INSTRUCTIONS   Increase dietary fiber in your diet, such as fruits, vegetables, whole grains, and beans. Limit high-fat and processed sugars in your diet, such as [place] fries, hamburgers, cookies, candies, and soda.   A fiber supplement may be added to your diet if you cannot get enough fiber from foods.   Drink enough fluids to keep your urine clear or pale yellow.   Exercise regularly or as directed by your caregiver.   Go to the restroom when you have the urge to go. Do not hold it.  Only take medicines as directed by your caregiver. Do not take other medicines for constipation without talking to your caregiver first. SEEK IMMEDIATE MEDICAL CARE IF:  You have bright red blood in your stool.   Your constipation lasts for more than 4 days or gets worse.   You have abdominal or rectal pain.   You have thin, pencil-like stools.  You have unexplained weight loss. MAKE SURE YOU:   Understand these instructions.  Will watch  your condition.  Will get help right away if you are not doing well or get worse. Document Released: 04/20/2004 Document Revised: 10/15/2011 Document Reviewed: 06/26/2011 [facility] Patient Information 2014 [place], [place].

## 2013-07-08 ENCOUNTER — Ambulatory Visit: Payer: BC Managed Care – PPO

## 2013-07-08 MED ORDER — CYANOCOBALAMIN 1000 MCG/ML IJ SOLN
1000.0000 ug | Freq: Once | INTRAMUSCULAR | Status: AC
  Administered 2013-07-08: 1000 ug via INTRAMUSCULAR

## 2013-07-21 DIAGNOSIS — Z1211 Encounter for screening for malignant neoplasm of colon: Secondary | ICD-10-CM | POA: Diagnosis not present

## 2013-07-21 LAB — IFOBT (OCCULT BLOOD): IFOBT: NEGATIVE

## 2013-07-28 ENCOUNTER — Encounter: Payer: Self-pay | Admitting: Gastroenterology

## 2013-08-12 ENCOUNTER — Ambulatory Visit (INDEPENDENT_AMBULATORY_CARE_PROVIDER_SITE_OTHER): Payer: BC Managed Care – PPO

## 2013-08-12 DIAGNOSIS — D649 Anemia, unspecified: Secondary | ICD-10-CM | POA: Diagnosis not present

## 2013-08-13 MED ORDER — CYANOCOBALAMIN 1000 MCG/ML IJ SOLN
1000.0000 ug | Freq: Once | INTRAMUSCULAR | Status: AC
  Administered 2013-08-13: 1000 ug via INTRAMUSCULAR

## 2013-08-28 ENCOUNTER — Encounter: Payer: Self-pay | Admitting: Gastroenterology

## 2013-09-02 ENCOUNTER — Telehealth: Payer: Self-pay | Admitting: Pulmonary Disease

## 2013-09-02 DIAGNOSIS — E785 Hyperlipidemia, unspecified: Secondary | ICD-10-CM

## 2013-09-02 DIAGNOSIS — D649 Anemia, unspecified: Secondary | ICD-10-CM

## 2013-09-02 DIAGNOSIS — I10 Essential (primary) hypertension: Secondary | ICD-10-CM

## 2013-09-02 DIAGNOSIS — F419 Anxiety disorder, unspecified: Secondary | ICD-10-CM

## 2013-09-02 DIAGNOSIS — N320 Bladder-neck obstruction: Secondary | ICD-10-CM

## 2013-09-02 NOTE — Telephone Encounter (Signed)
Please advise SN what labs pt needs to have done. thanks 

## 2013-09-03 NOTE — Telephone Encounter (Signed)
lmomtcb x1 for pt 

## 2013-09-03 NOTE — Telephone Encounter (Signed)
Per SN--  Ok for labs to be done prior to OV.  Labs have been placed in the computer for the pt. thanks

## 2013-09-03 NOTE — Telephone Encounter (Signed)
Pt aware that orders have been placed.  Nothing further needed

## 2013-09-04 ENCOUNTER — Other Ambulatory Visit (INDEPENDENT_AMBULATORY_CARE_PROVIDER_SITE_OTHER): Payer: BC Managed Care – PPO

## 2013-09-04 DIAGNOSIS — E785 Hyperlipidemia, unspecified: Secondary | ICD-10-CM

## 2013-09-04 DIAGNOSIS — F419 Anxiety disorder, unspecified: Secondary | ICD-10-CM

## 2013-09-04 DIAGNOSIS — F411 Generalized anxiety disorder: Secondary | ICD-10-CM

## 2013-09-04 DIAGNOSIS — D649 Anemia, unspecified: Secondary | ICD-10-CM | POA: Diagnosis not present

## 2013-09-04 DIAGNOSIS — I10 Essential (primary) hypertension: Secondary | ICD-10-CM

## 2013-09-04 DIAGNOSIS — N320 Bladder-neck obstruction: Secondary | ICD-10-CM

## 2013-09-04 LAB — CBC WITH DIFFERENTIAL/PLATELET
BASOS ABS: 0 10*3/uL (ref 0.0–0.1)
Basophils Relative: 0.4 % (ref 0.0–3.0)
EOS ABS: 0.6 10*3/uL (ref 0.0–0.7)
Eosinophils Relative: 9.9 % — ABNORMAL HIGH (ref 0.0–5.0)
HEMATOCRIT: 37.5 % — AB (ref 39.0–52.0)
HEMOGLOBIN: 12.6 g/dL — AB (ref 13.0–17.0)
LYMPHS ABS: 1.2 10*3/uL (ref 0.7–4.0)
Lymphocytes Relative: 20.6 % (ref 12.0–46.0)
MCHC: 33.5 g/dL (ref 30.0–36.0)
MCV: 94.6 fl (ref 78.0–100.0)
Monocytes Absolute: 0.7 10*3/uL (ref 0.1–1.0)
Monocytes Relative: 11.6 % (ref 3.0–12.0)
NEUTROS ABS: 3.3 10*3/uL (ref 1.4–7.7)
Neutrophils Relative %: 57.5 % (ref 43.0–77.0)
PLATELETS: 205 10*3/uL (ref 150.0–400.0)
RBC: 3.96 Mil/uL — ABNORMAL LOW (ref 4.22–5.81)
RDW: 13.6 % (ref 11.5–14.6)
WBC: 5.7 10*3/uL (ref 4.5–10.5)

## 2013-09-04 LAB — LIPID PANEL
CHOL/HDL RATIO: 4
Cholesterol: 159 mg/dL (ref 0–200)
HDL: 43.2 mg/dL (ref >39.00)
LDL Cholesterol: 103 mg/dL — ABNORMAL HIGH (ref 0–99)
TRIGLYCERIDES: 64 mg/dL (ref 0.0–149.0)
VLDL: 12.8 mg/dL (ref 0.0–40.0)

## 2013-09-04 LAB — HEPATIC FUNCTION PANEL
ALT: 16 U/L (ref 0–53)
AST: 24 U/L (ref 0–37)
Albumin: 4 g/dL (ref 3.5–5.2)
Alkaline Phosphatase: 60 U/L (ref 39–117)
BILIRUBIN DIRECT: 0.2 mg/dL (ref 0.0–0.3)
Total Bilirubin: 1.3 mg/dL — ABNORMAL HIGH (ref 0.3–1.2)
Total Protein: 6.9 g/dL (ref 6.0–8.3)

## 2013-09-04 LAB — BASIC METABOLIC PANEL
BUN: 15 mg/dL (ref 6–23)
CALCIUM: 9.3 mg/dL (ref 8.4–10.5)
CO2: 27 mEq/L (ref 19–32)
Chloride: 108 mEq/L (ref 96–112)
Creatinine, Ser: 0.9 mg/dL (ref 0.4–1.5)
GFR: 88.74 mL/min (ref >60.00)
Glucose, Bld: 96 mg/dL (ref 70–99)
POTASSIUM: 4.6 meq/L (ref 3.5–5.1)
Sodium: 141 mEq/L (ref 135–145)

## 2013-09-04 LAB — PSA: PSA: 3.47 ng/mL (ref 0.10–4.00)

## 2013-09-04 LAB — TSH: TSH: 3.78 u[IU]/mL (ref 0.35–5.50)

## 2013-09-09 ENCOUNTER — Encounter: Payer: Self-pay | Admitting: Pulmonary Disease

## 2013-09-09 ENCOUNTER — Ambulatory Visit (INDEPENDENT_AMBULATORY_CARE_PROVIDER_SITE_OTHER): Payer: BC Managed Care – PPO | Admitting: Pulmonary Disease

## 2013-09-09 ENCOUNTER — Ambulatory Visit: Payer: BC Managed Care – PPO

## 2013-09-09 VITALS — BP 136/64 | HR 50 | Temp 97.0°F | Ht 71.0 in | Wt 171.2 lb

## 2013-09-09 DIAGNOSIS — I059 Rheumatic mitral valve disease, unspecified: Secondary | ICD-10-CM

## 2013-09-09 DIAGNOSIS — E785 Hyperlipidemia, unspecified: Secondary | ICD-10-CM

## 2013-09-09 DIAGNOSIS — E538 Deficiency of other specified B group vitamins: Secondary | ICD-10-CM

## 2013-09-09 DIAGNOSIS — I350 Nonrheumatic aortic (valve) stenosis: Secondary | ICD-10-CM

## 2013-09-09 DIAGNOSIS — I2510 Atherosclerotic heart disease of native coronary artery without angina pectoris: Secondary | ICD-10-CM | POA: Diagnosis not present

## 2013-09-09 DIAGNOSIS — I428 Other cardiomyopathies: Secondary | ICD-10-CM | POA: Diagnosis not present

## 2013-09-09 DIAGNOSIS — I359 Nonrheumatic aortic valve disorder, unspecified: Secondary | ICD-10-CM

## 2013-09-09 DIAGNOSIS — I10 Essential (primary) hypertension: Secondary | ICD-10-CM | POA: Diagnosis not present

## 2013-09-09 DIAGNOSIS — M1990 Unspecified osteoarthritis, unspecified site: Secondary | ICD-10-CM

## 2013-09-09 DIAGNOSIS — I429 Cardiomyopathy, unspecified: Secondary | ICD-10-CM

## 2013-09-09 DIAGNOSIS — I340 Nonrheumatic mitral (valve) insufficiency: Secondary | ICD-10-CM

## 2013-09-09 MED ORDER — CYANOCOBALAMIN 1000 MCG/ML IJ SOLN
1000.0000 ug | Freq: Once | INTRAMUSCULAR | Status: AC
  Administered 2013-09-09: 1000 ug via INTRAMUSCULAR

## 2013-09-09 NOTE — Patient Instructions (Signed)
Today we updated your med list in our EPIC system...    Continue your current medications the same...    We refilled your meds per request...  Today we reviewed your recent FASTING blood work & gave you a copy for your records...  Call for any questions...  You should plan for a general medical check up in about 6 months.Marland KitchenMarland Kitchen

## 2013-09-10 ENCOUNTER — Ambulatory Visit: Payer: BC Managed Care – PPO

## 2013-09-18 ENCOUNTER — Encounter: Payer: Self-pay | Admitting: Internal Medicine

## 2013-09-18 ENCOUNTER — Ambulatory Visit (INDEPENDENT_AMBULATORY_CARE_PROVIDER_SITE_OTHER): Payer: BC Managed Care – PPO | Admitting: Internal Medicine

## 2013-09-18 VITALS — BP 134/70 | HR 53 | Ht 71.0 in | Wt 172.0 lb

## 2013-09-18 DIAGNOSIS — Z95810 Presence of automatic (implantable) cardiac defibrillator: Secondary | ICD-10-CM

## 2013-09-18 DIAGNOSIS — I5022 Chronic systolic (congestive) heart failure: Secondary | ICD-10-CM

## 2013-09-18 DIAGNOSIS — I429 Cardiomyopathy, unspecified: Secondary | ICD-10-CM

## 2013-09-18 DIAGNOSIS — I428 Other cardiomyopathies: Secondary | ICD-10-CM

## 2013-09-18 DIAGNOSIS — R55 Syncope and collapse: Secondary | ICD-10-CM

## 2013-09-18 DIAGNOSIS — I2510 Atherosclerotic heart disease of native coronary artery without angina pectoris: Secondary | ICD-10-CM

## 2013-09-18 LAB — MDC_IDC_ENUM_SESS_TYPE_INCLINIC
Battery Remaining Longevity: 52.8 mo
Brady Statistic RA Percent Paced: 34 %
Date Time Interrogation Session: 20150213220611
Implantable Pulse Generator Serial Number: 610170
Lead Channel Impedance Value: 362.5 Ohm
Lead Channel Impedance Value: 425 Ohm
Lead Channel Pacing Threshold Amplitude: 0.75 V
Lead Channel Pacing Threshold Pulse Width: 0.5 ms
Lead Channel Sensing Intrinsic Amplitude: 5.1 mV
Lead Channel Setting Pacing Amplitude: 2 V
MDC IDC MSMT LEADCHNL RA PACING THRESHOLD AMPLITUDE: 0.75 V
MDC IDC MSMT LEADCHNL RA PACING THRESHOLD AMPLITUDE: 0.75 V
MDC IDC MSMT LEADCHNL RA PACING THRESHOLD PULSEWIDTH: 0.5 ms
MDC IDC MSMT LEADCHNL RA SENSING INTR AMPL: 2.6 mV
MDC IDC MSMT LEADCHNL RV PACING THRESHOLD PULSEWIDTH: 0.5 ms
MDC IDC SET LEADCHNL RV PACING AMPLITUDE: 2 V
MDC IDC SET LEADCHNL RV PACING PULSEWIDTH: 0.5 ms
MDC IDC SET LEADCHNL RV SENSING SENSITIVITY: 0.5 mV
MDC IDC STAT BRADY RV PERCENT PACED: 99.93 %
Zone Setting Detection Interval: 300 ms
Zone Setting Detection Interval: 375 ms

## 2013-09-18 NOTE — Progress Notes (Signed)
HPI [name] returns today for followup. He is a very pleasant 80 year old man with an ischemic cardiomyopathy, complete heart block, dyslipidemia, chronic systolic heart failure, status post biventricular ICD implantation. In the interim, he has done well. He continues to work full time. He denies chest pain, shortness of breath, peripheral edema, syncope, and has had no ICD shocks. He is looking forward to starting his garden early this spring. No Known Allergies   Current Outpatient Prescriptions  Medication Sig Dispense Refill  . aspirin 81 MG tablet Take 81 mg by mouth daily.        . clopidogrel (PLAVIX) 75 MG tablet Take 1 tablet (75 mg total) by mouth daily.  90 tablet  4  . cyanocobalamin (,VITAMIN B-12,) 1000 MCG/ML injection Inject 1,000 mcg into the muscle every 30 (thirty) days.        . fish oil-omega-3 fatty acids 1000 MG capsule Take 2 g by mouth daily.        Marland Kitchen gemfibrozil (LOPID) 600 MG tablet Take 1 tablet (600 mg total) by mouth daily.  90 tablet  4  . hydrocortisone (ANUSOL-HC) 25 MG suppository Place 1 suppository (25 mg total) rectally at bedtime.  12 suppository  1  . metoprolol succinate (TOPROL-XL) 100 MG 24 hr tablet Take 1 tablet (100 mg total) by mouth daily.  90 tablet  4  . Multiple Vitamins-Minerals (MENS MULTIVITAMIN PLUS PO) Take 1 tablet by mouth daily.        . ramipril (ALTACE) 10 MG capsule Take 1 capsule (10 mg total) by mouth daily.  90 capsule  4  . simvastatin (ZOCOR) 40 MG tablet Take 1 tablet (40 mg total) by mouth every evening.  90 tablet  3   No current facility-administered medications for this visit.     Past Medical History  Diagnosis Date  . Hypertension   . CAD (coronary artery disease)     DES to LAD 2005 /  DES to LAD 2008  . LBBB (left bundle branch block)   . Cardiomyopathy     Ischemic, EF 30%, 2005  . Mitral regurgitation     Mild, echo, October, 2011  . Aortic stenosis     Mild, echo, October, 2011  . AV block, complete      Permanent pacemaker  . ICD (implantable cardiac defibrillator), single, in situ     for syncope and arrhythmia 2005 / generator change June, 2011  . Dyslipidemia   . GERD (gastroesophageal reflux disease)   . DJD (degenerative joint disease)   . Anemia, unspecified   . Vitamin B12 deficiency   . Ejection fraction < 50%     EF 30%, echo, 2005  /  EF 30%, catheterization, 2008  . Syncope     2005, ICD placed  . Systolic CHF, chronic   . Rectal fissure   . Hyperlipidemia     ROS:   All systems reviewed and negative except as noted in the HPI.   Past Surgical History  Procedure Laterality Date  . Icd implanted  2005  . Laparoscopic cholecystectomy  04/2005    Dr. Lucia Gaskins  . Cardiac catheterization  2008     Family History  Problem Relation Age of Onset  . Stroke Brother   . Lung cancer Brother   . Stroke Mother   . Cancer Mother 84  . Stroke Father 16     History   Social History  . Marital Status: Widowed    Spouse Name: N/A  Number of Children: 2  . Years of Education: N/A   Occupational History  . repairs looms at [employer]    Social History Main Topics  . Smoking status: Former Smoker -- 1.00 packs/day for 2 years    Types: Cigarettes    Quit date: 08/06/1952  . Smokeless tobacco: Never Used  . Alcohol Use: No  . Drug Use: No  . Sexual Activity: Not on file   Other Topics Concern  . Not on file   Social History Narrative  . No narrative on file     BP 134/70  Pulse 53  Ht 5\' 11"  (1.803 m)  Wt 172 lb (78.019 kg)  BMI 24.00 kg/m2  Physical Exam:  Well appearing 80 year old man, NAD HEENT: Unremarkable Neck:  No JVD, no thyromegally Lungs:  Clear with no wheezes, rales, or rhonchi. HEART:  Regular rate rhythm, no murmurs, no rubs, no clicks Abd:  soft, positive bowel sounds, no organomegally, no rebound, no guarding Ext:  2 plus pulses, no edema, no cyanosis, no clubbing Skin:  No rashes no nodules Neuro:  CN II through XII intact,  motor grossly intact  DEVICE  Normal device function.  See PaceArt for details.   Assess/Plan:

## 2013-09-18 NOTE — Patient Instructions (Signed)
Your physician wants you to follow-up in: 12 months with Dr Knox Saliva will receive a reminder letter in the mail two months in advance. If you don't receive a letter, please call our office to schedule the follow-up appointment.   Remote monitoring is used to monitor your Pacemaker of ICD from home. This monitoring reduces the number of office visits required to check your device to one time per year. It allows [place] to keep an eye on the functioning of your device to ensure it is working properly. You are scheduled for a device check from home on 12/21/13. You may send your transmission at any time that day. If you have a wireless device, the transmission will be sent automatically. After your physician reviews your transmission, you will receive a postcard with your next transmission date.

## 2013-09-18 NOTE — Assessment & Plan Note (Signed)
He denies anginal symptoms. No change in medical therapy. 

## 2013-09-18 NOTE — Assessment & Plan Note (Signed)
His symptoms are class 2. He will continue his current medical therapy. I have asked the patient to reduce his sodium intake.

## 2013-09-18 NOTE — Assessment & Plan Note (Signed)
His St. Jude device is working normally. Will recheck in several months.  

## 2013-10-11 ENCOUNTER — Other Ambulatory Visit: Payer: Self-pay | Admitting: Pulmonary Disease

## 2013-10-12 ENCOUNTER — Ambulatory Visit (INDEPENDENT_AMBULATORY_CARE_PROVIDER_SITE_OTHER): Payer: BC Managed Care – PPO

## 2013-10-12 DIAGNOSIS — E538 Deficiency of other specified B group vitamins: Secondary | ICD-10-CM | POA: Diagnosis not present

## 2013-10-14 MED ORDER — CYANOCOBALAMIN 1000 MCG/ML IJ SOLN
1000.0000 ug | Freq: Once | INTRAMUSCULAR | Status: AC
  Administered 2013-10-14: 1000 ug via INTRAMUSCULAR

## 2013-11-11 ENCOUNTER — Ambulatory Visit (INDEPENDENT_AMBULATORY_CARE_PROVIDER_SITE_OTHER): Payer: BC Managed Care – PPO

## 2013-11-11 DIAGNOSIS — E538 Deficiency of other specified B group vitamins: Secondary | ICD-10-CM | POA: Diagnosis not present

## 2013-11-12 MED ORDER — CYANOCOBALAMIN 1000 MCG/ML IJ SOLN
1000.0000 ug | Freq: Once | INTRAMUSCULAR | Status: AC
  Administered 2013-11-12: 1000 ug via INTRAMUSCULAR

## 2013-12-11 ENCOUNTER — Ambulatory Visit (INDEPENDENT_AMBULATORY_CARE_PROVIDER_SITE_OTHER): Payer: BC Managed Care – PPO

## 2013-12-11 DIAGNOSIS — E538 Deficiency of other specified B group vitamins: Secondary | ICD-10-CM | POA: Diagnosis not present

## 2013-12-14 MED ORDER — CYANOCOBALAMIN 1000 MCG/ML IJ SOLN
1000.0000 ug | Freq: Once | INTRAMUSCULAR | Status: AC
  Administered 2013-12-14: 1000 ug via INTRAMUSCULAR

## 2013-12-21 ENCOUNTER — Encounter: Payer: Self-pay | Admitting: Internal Medicine

## 2013-12-21 ENCOUNTER — Ambulatory Visit (INDEPENDENT_AMBULATORY_CARE_PROVIDER_SITE_OTHER): Payer: BC Managed Care – PPO | Admitting: *Deleted

## 2013-12-21 DIAGNOSIS — R55 Syncope and collapse: Secondary | ICD-10-CM | POA: Diagnosis not present

## 2013-12-21 DIAGNOSIS — I428 Other cardiomyopathies: Secondary | ICD-10-CM | POA: Diagnosis not present

## 2013-12-21 DIAGNOSIS — I429 Cardiomyopathy, unspecified: Secondary | ICD-10-CM

## 2013-12-21 NOTE — Progress Notes (Signed)
Remote ICD transmission.   

## 2013-12-23 LAB — MDC_IDC_ENUM_SESS_TYPE_REMOTE
Battery Remaining Longevity: 50 mo
Brady Statistic RA Percent Paced: 31 %
HighPow Impedance: 34 Ohm
Implantable Pulse Generator Serial Number: 610170
Lead Channel Pacing Threshold Amplitude: 1 V
Lead Channel Pacing Threshold Pulse Width: 0.5 ms
Lead Channel Sensing Intrinsic Amplitude: 11.1 mV
Lead Channel Setting Pacing Pulse Width: 0.5 ms
Lead Channel Setting Sensing Sensitivity: 0.5 mV
MDC IDC MSMT LEADCHNL RA IMPEDANCE VALUE: 390 Ohm
MDC IDC MSMT LEADCHNL RA SENSING INTR AMPL: 1.6 mV
MDC IDC MSMT LEADCHNL RV IMPEDANCE VALUE: 390 Ohm
MDC IDC SET LEADCHNL RA PACING AMPLITUDE: 2 V
MDC IDC SET LEADCHNL RV PACING AMPLITUDE: 2 V
MDC IDC SET ZONE DETECTION INTERVAL: 300 ms
MDC IDC STAT BRADY RV PERCENT PACED: 100 %
Zone Setting Detection Interval: 375 ms

## 2013-12-26 ENCOUNTER — Encounter: Payer: Self-pay | Admitting: Pulmonary Disease

## 2013-12-26 NOTE — Progress Notes (Signed)
Subjective:    Patient ID: [name], male    DOB: 1931/11/14, 81 y.o.   MRN: [id number]  HPI 80 y/o WM here for a follow up visit & review of mult medical problems... 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  ~  September 07, 2011:  24mo ROV & [name] reports a good interval w/o new complaints or concerns;  Requests B12 shot today & refill all meds for 90d supplies...    BP remains controlled on Metoprolol & Ramipril;  Cardiac stable on ASA/ Plavix & [name] denies CP, palpit, dizzy, syncope, SOB, or edema;  Followed by Benay Spice for Cards (seen 10/12 doing satis, no changes made) & DrTaylor for EP- AICD is functioning normally as of last check;  FLP looks good on his Simva40, Lopid600, & FishOil;  CBC is wnl on his B12 shots monthly...  ~  March 06, 2012:  67mo ROV & [name] continues stable, still working part time, & his CC is heel pain/ plantar fasciitis> [name] has seen Ortho/ Podiatry & we reviewed hot soaks, stretching exercises, etc...     [name] saw DrTaylor for Cards 2/13> ischemic cardiomyopathy, heart block w/ ICD (generator change ~75yr ago), class 1-2 symptoms- doing well & no changes made...    We reviewed prob list, meds, xrays and labs> see below for updates >>  LABS 7/13:  FLP- ok x LDL=110 on Simva40;  LFTs- wnl...  ~  September 10, 2012:  49mo ROV & [name] continues to do well- no new complaints or concerns... We reviewed the following medical problems during today's office visit >>     HBP> on MetopER100, Altace10;  BP= 148/60 & [name] denies CP, palpit, dizzy, SOB, edema...    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    Hx Syncope, AV block, Implanted Defib> followed by DrTaylor w/ pacer checks every 2-4mo [name] says;  generator changed 6/11; [name] denies CP/angina, dizzy/ syncope, etc...    Chol> on Simva40, Lopid600, FishOil; last FLP 1/14 showed TChol 152, TG 74, HDL 38, LDL 99    GI- GERD> [name] had LapChole 2006; [name] notes some constip & uses Benefiber which helps...    DJD> [name] just uses Tylenol as needed...    VitB12 defic> on B12 shots monthly... We reviewed prob list, meds, xrays and labs> see below for updates >> [name] had the 2013 Flu vaccine 10/13; [name] requests 90d refills...  ~  March 11, 2013:  80mo ROV & [name] continues to do well- no new complaints or concerns;  We reviewed the following medical problems during today's office visit >>     HBP> on MetopER100AM, Altace10PM;  BP= 120/70 & [name] denies CP, palpit, dizzy, SOB, edema...    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    Hx Syncope, AV block, Implanted Defib> followed by DrTaylor (seen 2/14 w/ his StJude biventricICD working normally) w/ pacer checks every 2-25mo [name] says; generator changed 6/11;  [name] denies CP/angina, dizzy/ syncope, etc...    Chol> on Simva40, Lopid600, FishOil; FLP 7/14 shows TChol 171, TG 100, HDL 47, LDL 104    GI- GERD> [name] had LapChole 2006; [name] notes some constip & uses Benefiber which helps; cannot find reference to prev colonoscopy in Epic or [facility] but [name] says neg colon by DrPatterson in the past...    DJD> [name] just uses Tylenol as needed...    VitB12 defic> on B12 shots monthly... We reviewed prob list, meds, xrays and labs> see below for updates >>   ~  September 09, 2013:  63mo ROV & [name] continues to do well- no new complaints or concerns;  We reviewed the following medical problems during today's office visit >>     HBP> on MetopER100AM, Altace10PM;  BP= 136/64 & [name] denies CP, palpit, dizzy,  SOB, edema...    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    Hx Syncope, AV block, Implanted Defib> followed by DrTaylor (seen 2/14 w/ his StJude biventricICD working normally) w/ pacer checks every 2-78mo [name] says; generator changed 6/11; [name] denies CP/angina, dizzy/ syncope, etc...    Chol> on Simva40, Lopid600, FishOil; FLP 1/15 shows TChol 171, TG 100, HDL 47, LDL 104    GI- GERD, constip> [name] had LapChole 2006; [name] notes constip & had eval by DrPatterson; cannot find reference to prev colonoscopy in Epic or [facility]ity but [name] says neg colon 2005 by DrPatterson; Rectal exam showed anal stenosis- dilated via anoscopy; [name] was treated w/ Linzess & Chronulac & AnusolHC and improved...     DJD> [name] just uses Tylenol as needed...    VitB12 defic> on B12 shots monthly... We reviewed prob list, meds, xrays and labs> see below for updates >>   LABS 1/15:  FLP- ok on Simva40+Lopid600;  Chems- wnl;  CBC- ok w/ Hg=12.6;  TSH=3.78;  PSA=3.47...           Problem List:  HYPERTENSION (ICD-401.9) - controlled on TOPROL XL 100mg /d & ALTACE 10mg /d... takes meds regularly and tolerates well...  ~  7/12: BP= 120/68 and OK at home per pt; [name] gets rare HA which [name] believes is related to his BP; denies fatigue, visual changes, CP, palipit, dizziness, syncope, dyspnea, edema, etc... ~  CXR 9/12 showed dual lead pacer/ AICD, norm heart size, clear lungs, NAD... ~  2/13:  BP= 124/68 & [name] remains asymptomatic... ~  8/13:  BP= 122/58 & [name] denies CP, palpit, SOB, edema, etc... ~  2/14: on MetopER100, Altace10;  BP= 148/60 & [name] denies CP, palpit, dizzy, SOB, edema. ~  8/14: on MetopER100 in AM, Altace10 in PM;  BP= 120/70 & [name] denies CP, palpit, dizzy, SOB, edema. ~  2/15: on MetopER100 &  Altace10; BP= 136/64 & [name] remains asymptomatic...  CAD (ICD-414.00) - on ASA 81mg /d & PLAVIX 75mg /d> followed by Benay Spice... ISCHEMIC CARDIOMYOPATHY (ICD-414.8) - [name]'s had prev MI w/ mult caths/ interventions. CHRONIC SYSTOLIC HEART FAILURE (DGU-440.34) AORTIC STENOSIS (ICD-424.1) > Mild by 2DEcho 10/11 MITRAL REGURGITATION (ICD-396.3) ~  last cath 5/08 showed norm Lmain, patent stent in prox LAD, 80-90% lesion in mid-LAD w/ PCI & stent placed, & 50% ostial stenosis of 1st diagonal branch of LAD; 40%mid & 70%distal CIRC; 50%prox  40%mid 40%distal RCA; Global HK w/EF=30%. ~  NuclearStressTest 4/09 showed infer scar, no ischemia, EF=40%. ~  2DEcho 10/11 showed mild LVH, decr LVF w/ EF= 35-40% & mild HK, gr1DD, mild AS & MR, mild LAdil. ~  EKG 10/12 showed pacer rhythm... 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   Hx of SYNCOPE (ICD-780.2) AV BLOCK, COMPLETE (ICD-426.0) AUTOMATIC IMPLANTABLE CARDIAC DEFIBRILLATOR SITU (ICD-V45.02) ~  Pacer/ ICD per DrTaylor w/ several adjustments in 2008... ~  5/11:  f/u DrTaylor w/ decision for elective generator change> done 6/11 & tol well. ~  [name] continues to f/u w/ DrTaylor every 6 months... ~  2/13: f/u DrTaylor doing satis, device working normally, no changes made & f/u 53yr... ~  2/14: Hx Syncope, AV block, Implanted Defib> followed by DrTaylor w/ pacer checks every 2-72mo [name] says; generator changed 6/11; [name] denies CP/angina, dizzy/ syncope, etc.  HYPERCHOLESTEROLEMIA (ICD-272.0) - takes SIMVASTATIN 40mg /d, GEMFIBRIZOL 600mg /d, & FISH OIL Bid... ~  [place] 08/14/07 on Simva40 showed TChol 163, TG 94, HDL 34, LDL 111... rec incr Simvastatin to 80mg /d. ~  FLP 02/24/08 on Simva80 showed TChol 136, TG 92, HDL 38, LDL 80... continue same. ~  FLP 1/10 showed TChol  125, TG 67, HDL 32, LDL 80... rec- continue same. ~  FLP 7/10 showed TChol 124, TG 86, HDL 36, LDL 71 ~  FLP 1/11 on Simva80+Gemfib600 showed TChol 137, TG 99, HDL 39, LDL 78 ~  FLP 8/11 on Simva80+Gemfib600 showed TChol 134, TG 82, HDL 36, LDL 82... Simva decr to 40. ~  FLP 1/12 on Simva40+Gemfib600 showed TChol 157, TG 139, HDL 38, LDL 92 ~  FLP 7/12 on Simva40+Gemfib600 showed TChol 163, TG 202, HDL 43, LDL 96... Needs better low fat diet. ~  FLP 1/13 on Simva40+Gemfib600 showed TChol 149, TG 50, HDL 39, LDL 100 ~  FLP 7/13 on Simva40+Gemfib600 showed TChol 172, TG 70, HDL 48, LDL 110 ~  FLP 1/14 on Simva40+Gemfib600 showed TChol 152, TG 74, HDL 38, LDL 99  ~  FLP 8/14 on Simva40+Gemfib600 showed TChol 171, TG 100, HDL 47, LDL 104 ~  FLP 1/15 on Simva40+Gemfib600 showed TChol 159, TG 64, HDL 43, LDL 103  GERD (ICD-530.81) - [name] had ERCP in 8/06 w/ norm ducts, +gallstones, s/p lap chole 2006... [name] uses OTC meds Prn... ~  [name] notes that [name] had a colonoscopy in the past from DrPatterson, & [name] reports that it was neg.  DEGENERATIVE JOINT DISEASE (ICD-715.90) - [name] tried Mobic but it bothered his stomach, therefore uses Tylenol Prn.  VITAMIN B12 DEFICIENCY (ICD-266.2) - old chart not available- ? details of this Dx... on B12 shots xyrs. ~  labs 1/09 showed Hg=12.9 ~  labs 1/10 showed Hg= 12.7 ~  labs 1/11 showed Hg= 12.6 ~  labs 1/12 showed Hg= 13.2 ~  Labs 1/13 showed Hg= 12.6 ~  Labs 1/14 showed Hg= 12.7 ~  Labs 1/15 showed Hg= 12.6  Health Maintenance: ~  GI:  Followed by DrPatterson ~  GU:  PSA checked yearly & remains wnl... ~  Immunizations:  [name] gets the yearly Flu vaccine;  [name], [name] says;  given Pneumovax 1/11 age 33.   Past Surgical History  Procedure Laterality  Date  . Icd implanted  2005  . Laparoscopic cholecystectomy  04/2005    Dr. Lucia Gaskins  . Cardiac catheterization  2008    Outpatient Encounter Prescriptions as of 09/09/2013   Medication Sig  . aspirin 81 MG tablet Take 81 mg by mouth daily.    . cyanocobalamin (,VITAMIN B-12,) 1000 MCG/ML injection Inject 1,000 mcg into the muscle every 30 (thirty) days.    . fish oil-omega-3 fatty acids 1000 MG capsule Take 2 g by mouth daily.    . hydrocortisone (ANUSOL-HC) 25 MG suppository Place 1 suppository (25 mg total) rectally at bedtime.  . Multiple Vitamins-Minerals (MENS MULTIVITAMIN PLUS PO) Take 1 tablet by mouth daily.    . [DISCONTINUED] clopidogrel (PLAVIX) 75 MG tablet Take 1 tablet (75 mg total) by mouth daily.  . [DISCONTINUED] gemfibrozil (LOPID) 600 MG tablet Take 1 tablet (600 mg total) by mouth daily.  . [DISCONTINUED] ramipril (ALTACE) 10 MG capsule Take 1 capsule (10 mg total) by mouth daily.  . [DISCONTINUED] simvastatin (ZOCOR) 40 MG tablet Take 1 tablet (40 mg total) by mouth every evening.  . [DISCONTINUED] lactulose (CEPHULAC) 10 G packet Take 1 packet (10 g total) by mouth 3 (three) times daily.  . [DISCONTINUED] Linaclotide (LINZESS) 145 MCG CAPS capsule Take 1 capsule (145 mcg total) by mouth daily.  . [DISCONTINUED] metoprolol succinate (TOPROL-XL) 100 MG 24 hr tablet Take 1 tablet (100 mg total) by mouth daily.  . [EXPIRED] cyanocobalamin ((VITAMIN B-12)) injection 1,000 mcg     No Known Allergies   Current Medications, Allergies, Past Medical History, Past Surgical History, Family History, and Social History were reviewed in [facility] record.    Review of Systems        See HPI - all other systems neg except as noted...  The patient complains of dyspnea on exertion.  The patient denies anorexia, fever, weight loss, weight gain, vision loss, decreased hearing, hoarseness, chest pain, syncope, peripheral edema, prolonged cough, headaches, hemoptysis, abdominal pain, melena, hematochezia, severe indigestion/heartburn, hematuria, incontinence, muscle weakness, suspicious skin lesions, transient blindness, difficulty  walking, depression, unusual weight change, abnormal bleeding, enlarged lymph nodes, and angioedema.     Objective:   Physical Exam     WD, WN, 80 y/o WM in NAD... wt=170# ht=6'1" BMI=22... GENERAL:  Alert & oriented; pleasant & cooperative... HEENT:  Carrier/AT, EOM-wnl, PERRLA, EACs-clear, TMs-wnl, NOSE-clear, THROAT-clear & wnl. NECK:  Supple w/ fairROM; no JVD; normal carotid impulses w/o bruits; no thyromegaly or nodules palpated; no lymphadenopathy. CHEST:  Clear to P & A; without wheezes/ rales/ or rhonchi heard... HEART:  Pacer in L upper chest area~ nontender, RR, gr 1/6 SEM without rubs or gallops detected... ABDOMEN:  Soft & nontender; normal bowel sounds; no organomegaly or masses palpated... EXT: without deformities, mild arthritic changes; no varicose veins/ venous insuffic/ or edema. NEURO:  CN's intact; no focal neuro deficits apprec... DERM:  No lesions noted; no rash etc...  RADIOLOGY DATA:  Reviewed in the EPIC EMR & discussed w/ the patient...   LABORATORY DATA:  Reviewed in the EPIC EMR & discussed w/ the patient...     Assessment & Plan:    HBP>  Controlled on Metoprolol & Ramipril, continue same...  CAD, Cardiomyopathy, CHF, ValvHeartDis w/ AS & MR>  Followed by DrKatz/ [name] & stable on ASA/ Plavix, still working 3d per week etc...  Hx syncope, AVBlock, Pacer/AICD>  Followed by DrTaylor & stable, continue same...  LIPIDS>  FLP looks OK on Simva40,  Gemfib 600mg /d, & Fish Oil...  DJD>  [name] uses Tylenol as needed; still working 3d per week...  B-12 defic>  [name] remains on B12 shots monthly...   Patient's Medications  New Prescriptions   No medications on file  Previous Medications   ASPIRIN 81 MG TABLET    Take 81 mg by mouth daily.     CYANOCOBALAMIN (,VITAMIN B-12,) 1000 MCG/ML INJECTION    Inject 1,000 mcg into the muscle every 30 (thirty) days.     FISH OIL-OMEGA-3 FATTY ACIDS 1000 MG CAPSULE    Take 2 g by mouth daily.     HYDROCORTISONE (ANUSOL-HC) 25  MG SUPPOSITORY    Place 1 suppository (25 mg total) rectally at bedtime.   MULTIPLE VITAMINS-MINERALS (MENS MULTIVITAMIN PLUS PO)    Take 1 tablet by mouth daily.    Modified Medications   Modified Medication Previous Medication   CLOPIDOGREL (PLAVIX) 75 MG TABLET clopidogrel (PLAVIX) 75 MG tablet      TAKE 1 TABLET DAILY    Take 1 tablet (75 mg total) by mouth daily.   GEMFIBROZIL (LOPID) 600 MG TABLET gemfibrozil (LOPID) 600 MG tablet      TAKE 1 TABLET DAILY    Take 1 tablet (600 mg total) by mouth daily.   METOPROLOL SUCCINATE (TOPROL-XL) 100 MG 24 HR TABLET metoprolol succinate (TOPROL-XL) 100 MG 24 hr tablet      TAKE 1 TABLET DAILY    Take 1 tablet (100 mg total) by mouth daily.   RAMIPRIL (ALTACE) 10 MG CAPSULE ramipril (ALTACE) 10 MG capsule      TAKE 1 CAPSULE DAILY    Take 1 capsule (10 mg total) by mouth daily.   SIMVASTATIN (ZOCOR) 40 MG TABLET simvastatin (ZOCOR) 40 MG tablet      TAKE 1 TABLET EVERY EVENING    Take 1 tablet (40 mg total) by mouth every evening.  Discontinued Medications   LACTULOSE (CEPHULAC) 10 G PACKET    Take 1 packet (10 g total) by mouth 3 (three) times daily.   LINACLOTIDE (LINZESS) 145 MCG CAPS CAPSULE    Take 1 capsule (145 mcg total) by mouth daily.

## 2014-01-01 ENCOUNTER — Encounter: Payer: Self-pay | Admitting: Cardiology

## 2014-01-11 ENCOUNTER — Ambulatory Visit (INDEPENDENT_AMBULATORY_CARE_PROVIDER_SITE_OTHER): Payer: BC Managed Care – PPO

## 2014-01-11 DIAGNOSIS — E538 Deficiency of other specified B group vitamins: Secondary | ICD-10-CM

## 2014-01-12 MED ORDER — CYANOCOBALAMIN 1000 MCG/ML IJ SOLN
1000.0000 ug | Freq: Once | INTRAMUSCULAR | Status: AC
  Administered 2014-01-12: 1000 ug via INTRAMUSCULAR

## 2014-01-15 ENCOUNTER — Ambulatory Visit (INDEPENDENT_AMBULATORY_CARE_PROVIDER_SITE_OTHER): Payer: BC Managed Care – PPO | Admitting: Cardiology

## 2014-01-15 ENCOUNTER — Encounter: Payer: Self-pay | Admitting: Cardiology

## 2014-01-15 VITALS — BP 138/68 | HR 50 | Ht 71.0 in | Wt 164.1 lb

## 2014-01-15 DIAGNOSIS — R0989 Other specified symptoms and signs involving the circulatory and respiratory systems: Secondary | ICD-10-CM

## 2014-01-15 DIAGNOSIS — I429 Cardiomyopathy, unspecified: Secondary | ICD-10-CM

## 2014-01-15 DIAGNOSIS — R55 Syncope and collapse: Secondary | ICD-10-CM

## 2014-01-15 DIAGNOSIS — I059 Rheumatic mitral valve disease, unspecified: Secondary | ICD-10-CM | POA: Diagnosis not present

## 2014-01-15 DIAGNOSIS — I428 Other cardiomyopathies: Secondary | ICD-10-CM

## 2014-01-15 DIAGNOSIS — I10 Essential (primary) hypertension: Secondary | ICD-10-CM

## 2014-01-15 DIAGNOSIS — E785 Hyperlipidemia, unspecified: Secondary | ICD-10-CM

## 2014-01-15 DIAGNOSIS — R9430 Abnormal result of cardiovascular function study, unspecified: Secondary | ICD-10-CM

## 2014-01-15 DIAGNOSIS — I2510 Atherosclerotic heart disease of native coronary artery without angina pectoris: Secondary | ICD-10-CM | POA: Diagnosis not present

## 2014-01-15 DIAGNOSIS — I359 Nonrheumatic aortic valve disorder, unspecified: Secondary | ICD-10-CM

## 2014-01-15 DIAGNOSIS — IMO0002 Reserved for concepts with insufficient information to code with codable children: Secondary | ICD-10-CM

## 2014-01-15 DIAGNOSIS — I340 Nonrheumatic mitral (valve) insufficiency: Secondary | ICD-10-CM

## 2014-01-15 DIAGNOSIS — Z95810 Presence of automatic (implantable) cardiac defibrillator: Secondary | ICD-10-CM

## 2014-01-15 DIAGNOSIS — I350 Nonrheumatic aortic (valve) stenosis: Secondary | ICD-10-CM

## 2014-01-15 DIAGNOSIS — I5022 Chronic systolic (congestive) heart failure: Secondary | ICD-10-CM

## 2014-01-15 NOTE — Assessment & Plan Note (Signed)
His volume status is stable. He does not require diuretics. No further workup. No change in therapy.  As part of today's evaluation I spent greater than 25 minutes with is total care. More than half of this time is been with direct cont have not seen him for more than 2 years. We carefully discussed his care over the past 2 years.

## 2014-01-15 NOTE — Assessment & Plan Note (Signed)
Patient is on an ACE inhibitor and a beta blocker. I have not started spironolactone.

## 2014-01-15 NOTE — Assessment & Plan Note (Signed)
There is mild mitral regurgitation in the past. He does not need an echo.

## 2014-01-15 NOTE — Assessment & Plan Note (Signed)
His ICD is in place. It is followed carefully by Dr. [name].

## 2014-01-15 NOTE — Assessment & Plan Note (Signed)
He had syncope in 2005 and an ICD was placed. He's had no recurrent syncope. No further workup.

## 2014-01-15 NOTE — Assessment & Plan Note (Signed)
Coronary disease is stable. He is not having any symptoms. He does not need an exercise test. He is on aspirin. He is also on a statin. On a check his lipids before considering any change in his medications.

## 2014-01-15 NOTE — Assessment & Plan Note (Signed)
He has mild aortic valvular disease by history. He does not need an echo now.

## 2014-01-15 NOTE — Patient Instructions (Signed)
**Note De-Identified Dann Galicia Obfuscation** Your physician has requested that you have a carotid duplex. This test is an ultrasound of the carotid arteries in your neck. It looks at blood flow through these arteries that supply the brain with blood. Allow one hour for this exam. There are no restrictions or special instructions.  Your physician recommends that you return for lab work in: Fasting Lipid profile and a BMET on the same day as Carotid Duplex.  Your physician wants you to follow-up in: 1 year. You will receive a reminder letter in the mail two months in advance. If you don't receive a letter, please call our office to schedule the follow-up appointment.

## 2014-01-15 NOTE — Assessment & Plan Note (Signed)
Blood pressure is controlled. No change in therapy. 

## 2014-01-15 NOTE — Progress Notes (Signed)
Patient ID: [name], male   DOB: 1932-01-30, 80 y.o.   MRN: [id number]    HPI  The patient is seen to followup cardiomyopathy, coronary disease, aortic valve disease, mitral regurgitation. I saw him last October, 2012. He has an ICD in place and this has been followed by Dr. [name]. He's doing well. He has not had any discharges. He does not have shortness of breath or chest pain. His last echo was in 2011. His EF was in the range of 35-40% at that time. He had mild aortic stenosis.  No Known Allergies  Current Outpatient Prescriptions  Medication Sig Dispense Refill  . aspirin 81 MG tablet Take 81 mg by mouth daily.        . clopidogrel (PLAVIX) 75 MG tablet TAKE 1 TABLET DAILY  90 tablet  1  . cyanocobalamin (,VITAMIN B-12,) 1000 MCG/ML injection Inject 1,000 mcg into the muscle every 30 (thirty) days.        . fish oil-omega-3 fatty acids 1000 MG capsule Take 2 g by mouth daily.        Marland Kitchen gemfibrozil (LOPID) 600 MG tablet TAKE 1 TABLET DAILY  90 tablet  1  . metoprolol succinate (TOPROL-XL) 100 MG 24 hr tablet TAKE 1 TABLET DAILY  90 tablet  1  . Multiple Vitamins-Minerals (MENS MULTIVITAMIN PLUS PO) Take 1 tablet by mouth daily.        . ramipril (ALTACE) 10 MG capsule TAKE 1 CAPSULE DAILY  90 capsule  1  . simvastatin (ZOCOR) 40 MG tablet TAKE 1 TABLET EVERY EVENING  90 tablet  1   No current facility-administered medications for this visit.    History   Social History  . Marital Status: Widowed    Spouse Name: N/A    Number of Children: 2  . Years of Education: N/A   Occupational History  . repairs looms at [employer]    Social History Main Topics  . Smoking status: Former Smoker -- 1.00 packs/day for 2 years    Types: Cigarettes    Quit date: 08/06/1952  . Smokeless tobacco: Never Used  . Alcohol Use: No  . Drug Use: No  . Sexual Activity: Not on file   Other Topics Concern  . Not on file   Social History Narrative  . No narrative on file    Family  History  Problem Relation Age of Onset  . Stroke Brother   . Lung cancer Brother   . Stroke Mother   . Cancer Mother 56  . Stroke Father 57    Past Medical History  Diagnosis Date  . Hypertension   . CAD (coronary artery disease)     DES to LAD 2005 /  DES to LAD 2008  . LBBB (left bundle branch block)   . Cardiomyopathy     Ischemic, EF 30%, 2005  . Mitral regurgitation     Mild, echo, October, 2011  . Aortic stenosis     Mild, echo, October, 2011  . AV block, complete     Permanent pacemaker  . ICD (implantable cardiac defibrillator), single, in situ     for syncope and arrhythmia 2005 / generator change June, 2011  . Dyslipidemia   . GERD (gastroesophageal reflux disease)   . DJD (degenerative joint disease)   . Anemia, unspecified   . Vitamin B12 deficiency   . Ejection fraction < 50%     EF 30%, echo, 2005  /  EF 30%, catheterization, 2008  .  Syncope     2005, ICD placed  . Systolic CHF, chronic   . Rectal fissure   . Hyperlipidemia     Past Surgical History  Procedure Laterality Date  . Icd implanted  2005  . Laparoscopic cholecystectomy  04/2005    Dr. Lucia Gaskins  . Cardiac catheterization  2008    Patient Active Problem List   Diagnosis Date Noted  . Ejection fraction < 50%     Priority: High  . CAD (coronary artery disease)     Priority: High  . Cardiomyopathy     Priority: High  . Mitral regurgitation     Priority: High  . Single implantable cardiac defibrillator in situ     Priority: High  . Syncope     Priority: High  . Left carotid bruit 01/15/2014  . Automatic implantable cardioverter-defibrillator in situ 09/10/2011  . Chronic systolic heart failure 14/78/2956  . Hypertension   . LBBB (left bundle branch block)   . Aortic stenosis   . Dyslipidemia   . GERD (gastroesophageal reflux disease)   . UNSPECIFIED ANEMIA 08/03/2008  . VITAMIN B12 DEFICIENCY 03/01/2008  . DEGENERATIVE JOINT DISEASE 08/19/2007    ROS   Patient denies fever,  chills, headache, sweats, rash, change in vision, change in hearing, chest pain, cough, nausea vomiting, urinary symptoms. All other systems are reviewed and are negative  PHYSICAL EXAM  Patient looks quite good. He does not appear to have a visually in the last several years. He is oriented to person time and place. Affect is normal. Head is atraumatic. Sclera and conjunctiva are normal. There is no jugulovenous distention. There is a left carotid bruit. Lungs are clear. Respiratory effort is nonlabored. Cardiac exam reveals S1 and S2. There is a 2/6 systolic murmur. The abdomen is soft. There is no peripheral edema. There no musculoskeletal deformities. There are no skin rashes.  Filed Vitals:   01/15/14 1431  BP: 138/68  Pulse: 50  Height: 5\' 11"  (1.803 m)  Weight: 164 lb 1.9 oz (74.444 kg)   EKG is done today and reviewed by me. The rhythm is paced with a rate of 50.  ASSESSMENT & PLAN

## 2014-01-15 NOTE — Assessment & Plan Note (Signed)
Ejection fraction was checked last in 2011. Was in the 35-40% range. He is on appropriate medications with appropriate devices. No change in therapy at this time. He does not need an echo.

## 2014-01-15 NOTE — Assessment & Plan Note (Signed)
Today I hear a bruit. I have no records of her carotid Doppler being done over the years. Carotid Doppler will be arranged.

## 2014-01-20 ENCOUNTER — Ambulatory Visit (HOSPITAL_COMMUNITY): Payer: BC Managed Care – PPO | Attending: Cardiology | Admitting: *Deleted

## 2014-01-20 ENCOUNTER — Other Ambulatory Visit (INDEPENDENT_AMBULATORY_CARE_PROVIDER_SITE_OTHER): Payer: BC Managed Care – PPO

## 2014-01-20 DIAGNOSIS — I658 Occlusion and stenosis of other precerebral arteries: Secondary | ICD-10-CM | POA: Insufficient documentation

## 2014-01-20 DIAGNOSIS — R0989 Other specified symptoms and signs involving the circulatory and respiratory systems: Secondary | ICD-10-CM | POA: Insufficient documentation

## 2014-01-20 DIAGNOSIS — Z87891 Personal history of nicotine dependence: Secondary | ICD-10-CM | POA: Insufficient documentation

## 2014-01-20 DIAGNOSIS — I10 Essential (primary) hypertension: Secondary | ICD-10-CM | POA: Insufficient documentation

## 2014-01-20 DIAGNOSIS — I6529 Occlusion and stenosis of unspecified carotid artery: Secondary | ICD-10-CM | POA: Diagnosis not present

## 2014-01-20 DIAGNOSIS — I2510 Atherosclerotic heart disease of native coronary artery without angina pectoris: Secondary | ICD-10-CM | POA: Diagnosis not present

## 2014-01-20 DIAGNOSIS — E785 Hyperlipidemia, unspecified: Secondary | ICD-10-CM

## 2014-01-20 LAB — LIPID PANEL
CHOLESTEROL: 168 mg/dL (ref 0–200)
HDL: 40.8 mg/dL (ref >39.00)
LDL CALC: 104 mg/dL — AB (ref 0–99)
NonHDL: 127.2
Total CHOL/HDL Ratio: 4
Triglycerides: 117 mg/dL (ref 0.0–149.0)
VLDL: 23.4 mg/dL (ref 0.0–40.0)

## 2014-01-20 NOTE — Progress Notes (Signed)
Carotid duplex complete 

## 2014-01-21 ENCOUNTER — Encounter: Payer: Self-pay | Admitting: Cardiology

## 2014-01-21 ENCOUNTER — Telehealth: Payer: Self-pay | Admitting: Cardiology

## 2014-01-21 DIAGNOSIS — I779 Disorder of arteries and arterioles, unspecified: Secondary | ICD-10-CM | POA: Insufficient documentation

## 2014-01-21 DIAGNOSIS — I739 Peripheral vascular disease, unspecified: Secondary | ICD-10-CM

## 2014-01-21 NOTE — Telephone Encounter (Signed)
The pt is advised of his lab results and he verbalized understanding. 

## 2014-01-21 NOTE — Telephone Encounter (Signed)
Follow up    Per pt returning your call please give him a call back.

## 2014-02-10 ENCOUNTER — Ambulatory Visit (INDEPENDENT_AMBULATORY_CARE_PROVIDER_SITE_OTHER): Payer: BC Managed Care – PPO

## 2014-02-10 DIAGNOSIS — E538 Deficiency of other specified B group vitamins: Secondary | ICD-10-CM

## 2014-02-11 MED ORDER — CYANOCOBALAMIN 1000 MCG/ML IJ SOLN
1000.0000 ug | Freq: Once | INTRAMUSCULAR | Status: AC
  Administered 2014-02-11: 1000 ug via INTRAMUSCULAR

## 2014-02-17 ENCOUNTER — Telehealth: Payer: Self-pay | Admitting: Pulmonary Disease

## 2014-02-17 NOTE — Telephone Encounter (Signed)
[name] returned triage's call & asks to be reached at [phone].  Satira Anis

## 2014-02-17 NOTE — Telephone Encounter (Signed)
Called spoke with daughter. appt scheduled to see TP tomorrow. Nothing further needed

## 2014-02-17 NOTE — Telephone Encounter (Signed)
lmtcb x1 w/ co-worker

## 2014-02-18 ENCOUNTER — Ambulatory Visit (INDEPENDENT_AMBULATORY_CARE_PROVIDER_SITE_OTHER): Payer: BC Managed Care – PPO | Admitting: Adult Health

## 2014-02-18 ENCOUNTER — Encounter: Payer: Self-pay | Admitting: Adult Health

## 2014-02-18 ENCOUNTER — Other Ambulatory Visit (INDEPENDENT_AMBULATORY_CARE_PROVIDER_SITE_OTHER): Payer: BC Managed Care – PPO

## 2014-02-18 VITALS — BP 120/60 | HR 50 | Temp 97.6°F | Ht 71.5 in | Wt 165.0 lb

## 2014-02-18 DIAGNOSIS — R1032 Left lower quadrant pain: Secondary | ICD-10-CM

## 2014-02-18 DIAGNOSIS — I2510 Atherosclerotic heart disease of native coronary artery without angina pectoris: Secondary | ICD-10-CM

## 2014-02-18 DIAGNOSIS — K5289 Other specified noninfective gastroenteritis and colitis: Secondary | ICD-10-CM

## 2014-02-18 DIAGNOSIS — K529 Noninfective gastroenteritis and colitis, unspecified: Secondary | ICD-10-CM | POA: Insufficient documentation

## 2014-02-18 LAB — URINALYSIS, ROUTINE W REFLEX MICROSCOPIC
BILIRUBIN URINE: NEGATIVE
HGB URINE DIPSTICK: NEGATIVE
KETONES UR: NEGATIVE
LEUKOCYTES UA: NEGATIVE
Nitrite: NEGATIVE
RBC / HPF: NONE SEEN (ref 0–?)
Specific Gravity, Urine: 1.01 (ref 1.000–1.030)
Total Protein, Urine: NEGATIVE
Urine Glucose: NEGATIVE
Urobilinogen, UA: 0.2 (ref 0.0–1.0)
pH: 6 (ref 5.0–8.0)

## 2014-02-18 LAB — BASIC METABOLIC PANEL
BUN: 14 mg/dL (ref 6–23)
CO2: 29 mEq/L (ref 19–32)
CREATININE: 1 mg/dL (ref 0.4–1.5)
Calcium: 9.2 mg/dL (ref 8.4–10.5)
Chloride: 104 mEq/L (ref 96–112)
GFR: 73.92 mL/min (ref >60.00)
GLUCOSE: 100 mg/dL — AB (ref 70–99)
POTASSIUM: 4.6 meq/L (ref 3.5–5.1)
Sodium: 139 mEq/L (ref 135–145)

## 2014-02-18 LAB — HEPATIC FUNCTION PANEL
ALBUMIN: 3.5 g/dL (ref 3.5–5.2)
ALT: 16 U/L (ref 0–53)
AST: 21 U/L (ref 0–37)
Alkaline Phosphatase: 58 U/L (ref 39–117)
Bilirubin, Direct: 0.2 mg/dL (ref 0.0–0.3)
Total Bilirubin: 1 mg/dL (ref 0.2–1.2)
Total Protein: 6.6 g/dL (ref 6.0–8.3)

## 2014-02-18 LAB — CBC WITH DIFFERENTIAL/PLATELET
BASOS PCT: 0.1 % (ref 0.0–3.0)
Basophils Absolute: 0 10*3/uL (ref 0.0–0.1)
Eosinophils Absolute: 9.6 10*3/uL — ABNORMAL HIGH (ref 0.0–0.7)
HCT: 34.4 % — ABNORMAL LOW (ref 39.0–52.0)
HEMOGLOBIN: 11.7 g/dL — AB (ref 13.0–17.0)
Lymphocytes Relative: 9.9 % — ABNORMAL LOW (ref 12.0–46.0)
Lymphs Abs: 1.7 10*3/uL (ref 0.7–4.0)
MCHC: 34 g/dL (ref 30.0–36.0)
MCV: 92.7 fl (ref 78.0–100.0)
MONO ABS: 0.7 10*3/uL (ref 0.1–1.0)
Monocytes Relative: 4.1 % (ref 3.0–12.0)
NEUTROS PCT: 29.1 % — AB (ref 43.0–77.0)
Neutro Abs: 4.9 10*3/uL (ref 1.4–7.7)
Platelets: 216 10*3/uL (ref 150.0–400.0)
RBC: 3.71 Mil/uL — ABNORMAL LOW (ref 4.22–5.81)
RDW: 13.2 % (ref 11.5–15.5)
WBC: 16.9 10*3/uL — AB (ref 4.0–10.5)

## 2014-02-18 LAB — SEDIMENTATION RATE: Sed Rate: 35 mm/hr — ABNORMAL HIGH (ref 0–22)

## 2014-02-18 NOTE — Patient Instructions (Addendum)
Begin Colace stool softner daily  Use Miralax 1 capful daily for constipation As needed   Gas X As needed  Gas /bloating.  Advance a bland diet  Avoid lactose for 5 days .  Labs today . follow up Dr. Lenna Gilford  As planned and As needed   Please contact office for sooner follow up if symptoms do not improve or worsen or seek emergency care    Late add  Labs returned w/ elevated WBC and significant eosinophilia  Refer to GI for eval

## 2014-02-18 NOTE — Progress Notes (Signed)
Subjective:    Patient ID: [name], male    DOB: 07-24-1932, 80 y.o.   MRN: [id number]  HPI 80 y/o WM    02/18/2014 Acute OV  Presents for an acute office visit.  Complains of 1 week of initial diarrhea for 2 days then diarrhea stopped. But nausea and pain in lower stomach persisted. Nausea improved but now complains of constipation. Had a very hard stools , painful to go at times w/ gas and bloating.  No bloody stools or fever.  No back pain or urinary symptoms.  No recent travel or abx use.  Has not taken any meds for symptoms.             Problem List:  HYPERTENSION (ICD-401.9) - controlled on TOPROL XL 100mg /d & ALTACE 10mg /d... takes meds regularly and tolerates well...  ~  7/12: BP= 120/68 and OK at home per pt; he gets rare HA which he believes is related to his BP; denies fatigue, visual changes, CP, palipit, dizziness, syncope, dyspnea, edema, etc... ~  CXR 9/12 showed dual lead pacer/ AICD, norm heart size, clear lungs, NAD... ~  2/13:  BP= 124/68 & he remains asymptomatic... ~  8/13:  BP= 122/58 & he denies CP, palpit, SOB, edema, etc... ~  2/14: on MetopER100, Altace10;  BP= 148/60 & he denies CP, palpit, dizzy, SOB, edema.  CAD (ICD-414.00) - on ASA 81mg /d & PLAVIX 75mg /d> followed by Benay Spice... ISCHEMIC CARDIOMYOPATHY (ICD-414.8) - he's had prev MI w/ mult caths/ interventions. CHRONIC SYSTOLIC HEART FAILURE (ZJQ-734.19) AORTIC STENOSIS (ICD-424.1) > Mild by 2DEcho 10/11 MITRAL REGURGITATION (ICD-396.3) ~  last cath 5/08 showed norm Lmain, patent stent in prox LAD, 80-90% lesion in mid-LAD w/ PCI & stent placed, & 50% ostial stenosis of 1st diagonal branch of LAD; 40%mid & 70%distal CIRC; 50%prox 40%mid 40%distal RCA; Global HK w/EF=30%. ~  NuclearStressTest 4/09 showed infer scar, no ischemia, EF=40%. ~  2DEcho 10/11 showed mild LVH, decr LVF w/ EF= 35-40% & mild HK, gr1DD, mild AS & MR, mild LAdil. ~  EKG 10/12 showed pacer rhythm... 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   Hx of SYNCOPE (ICD-780.2) AV BLOCK, COMPLETE (ICD-426.0) AUTOMATIC IMPLANTABLE CARDIAC DEFIBRILLATOR SITU (ICD-V45.02) ~  Pacer/ ICD per DrTaylor w/ several adjustments in 2008... ~  5/11:  f/u DrTaylor w/ decision for elective generator change> done 6/11 & tol well. ~  He continues to f/u w/ DrTaylor every 6 months... ~  2/13: f/u DrTaylor doing satis, device working normally, no changes made & f/u 69yr... ~  2/14: Hx Syncope, AV block, Implanted Defib> followed by DrTaylor w/ pacer checks every 2-89mo he says; generator changed 6/11; he denies CP/angina, dizzy/ syncope, etc.  HYPERCHOLESTEROLEMIA (ICD-272.0) - takes SIMVASTATIN 40mg /d, GEMFIBRIZOL 600mg /d, & FISH OIL Bid... ~  [place] 08/14/07 on Simva40 showed TChol 163, TG 94, HDL 34, LDL 111... rec incr Simvastatin to 80mg /d. ~  FLP 02/24/08 on Simva80 showed TChol 136, TG 92, HDL 38, LDL 80... continue same. ~  FLP 1/10 showed TChol 125, TG 67, HDL 32, LDL 80... rec- continue same. ~  FLP 7/10 showed TChol 124, TG 86, HDL 36, LDL 71 ~  FLP 1/11 on Simva80+Gemfib600 showed TChol 137, TG 99, HDL 39, LDL 78 ~  FLP 8/11 on Simva80+Gemfib600 showed TChol 134, TG 82, HDL 36, LDL 82... Simva decr to 40. ~  FLP 1/12 on Simva40+Gemfib600 showed TChol 157, TG 139, HDL 38, LDL 92 ~  FLP 7/12 on Simva40+Gemfib600 showed TChol 163, TG 202, HDL 43, LDL 96... Needs better low fat diet. ~  FLP 1/13 on Simva40+Gemfib600 showed TChol 149, TG 50, HDL 39, LDL 100 ~  FLP 7/13 on Simva40+Gemfib600 showed TChol 172, TG 70, HDL 48, LDL 110  GERD (ICD-530.81) - he had ERCP in 8/06 w/ norm ducts, +gallstones, s/p lap chole 2006... he uses OTC meds Prn... ~  he notes that he had a colonoscopy in the past from DrPatterson, & he reports  that it was neg.  DEGENERATIVE JOINT DISEASE (ICD-715.90) - he tried Mobic but it bothered his stomach, therefore uses Tylenol Prn.  VITAMIN B12 DEFICIENCY (ICD-266.2) - old chart not available- ? details of this Dx... on B12 shots xyrs. ~  labs 1/09 showed Hg=12.9 ~  labs 1/10 showed Hg= 12.7 ~  labs 1/11 showed Hg= 12.6 ~  labs 1/12 showed Hg= 13.2 ~  Labs 1/13 showed Hg= 12.6  Health Maintenance: ~  GI:  Followed by DrPatterson ~  GU:  PSA checked yearly & remains wnl... ~  Immunizations:  he gets the yearly Flu vaccine;  [place] he says;  given Pneumovax 1/11 age 39.   Past Surgical History  Procedure Laterality Date  . Icd implanted  2005  . Laparoscopic cholecystectomy  04/2005    Dr. Lucia Gaskins  . Cardiac catheterization  2008    Outpatient Encounter Prescriptions as of 02/18/2014  Medication Sig  . aspirin 81 MG tablet Take 81 mg by mouth daily.    . clopidogrel (PLAVIX) 75 MG tablet TAKE 1 TABLET DAILY  . cyanocobalamin (,VITAMIN B-12,) 1000 MCG/ML injection Inject 1,000 mcg into the muscle every 30 (thirty) days.    . fish oil-omega-3 fatty acids 1000 MG capsule Take 2 g by mouth daily.    Marland Kitchen gemfibrozil (LOPID) 600 MG tablet TAKE 1 TABLET DAILY  . metoprolol succinate (TOPROL-XL) 100 MG 24 hr tablet TAKE 1 TABLET DAILY  . Multiple Vitamins-Minerals (MENS MULTIVITAMIN PLUS PO) Take 1 tablet by mouth daily.    . ramipril (ALTACE) 10 MG capsule TAKE 1 CAPSULE DAILY  . simvastatin (ZOCOR) 40 MG tablet TAKE 1 TABLET EVERY EVENING    No Known Allergies   Current Medications, Allergies, Past Medical History, Past Surgical History, Family History, and Social History were reviewed in [facility] record.    Review of Systems        See HPI - all other systems neg except as noted...  The patient complains of dyspnea on exertion.  The patient denies anorexia, fever, weight loss, weight gain, vision loss, decreased  hearing, hoarseness, chest pain, syncope, peripheral edema, prolonged cough, headaches, hemoptysis, abdominal pain, melena, hematochezia, severe indigestion/heartburn, hematuria, incontinence, muscle weakness, suspicious skin lesions, transient blindness, difficulty walking, depression, unusual weight change, abnormal bleeding, enlarged lymph nodes, and angioedema.     Objective:   Physical Exam     WD, WN, 80 y/o WM in NAD  GENERAL:  Alert & oriented; pleasant & cooperative... HEENT:  [place]/AT,  , EACs-clear, TMs-wnl, NOSE-clear without bleeding noted , THROAT-clear & wnl. NECK:  Supple w/ fairROM; no JVD; normal carotid impulses w/o bruits; no thyromegaly or nodules palpated; no lymphadenopathy. CHEST:  Clear to P & A; without  wheezes/ rales/ or rhonchi heard... HEART:  Pacer in L upper chest area~ nontender, RR, gr 1/6 SEM without rubs or gallops detected... ABDOMEN:  Soft & nontender; normal bowel sounds; no organomegaly or masses palpated...no guarding or rebound  EXT: without deformities, mild arthritic changes; no varicose veins/ venous insuffic/ or edema. NEURO:    no focal neuro deficits apprec...     Assessment & Plan:

## 2014-02-18 NOTE — Assessment & Plan Note (Signed)
Acute gastroenteritis seems to be resolving  Check labs   Plan  Begin Colace stool softner daily  Use Miralax 1 capful daily for constipation As needed   Gas X As needed  Gas /bloating.  Advance a bland diet  Avoid lactose for 5 days .  Labs today . follow up Dr. Lenna Gilford  As planned and As needed   Please contact office for sooner follow up if symptoms do not improve or  worsen or seek emergency care

## 2014-02-19 ENCOUNTER — Encounter: Payer: Self-pay | Admitting: Internal Medicine

## 2014-02-19 ENCOUNTER — Other Ambulatory Visit: Payer: Self-pay | Admitting: Adult Health

## 2014-02-19 ENCOUNTER — Telehealth: Payer: Self-pay | Admitting: Adult Health

## 2014-02-19 DIAGNOSIS — K529 Noninfective gastroenteritis and colitis, unspecified: Secondary | ICD-10-CM

## 2014-02-19 NOTE — Progress Notes (Signed)
Quick Note:  Pt aware - See 7.17.15 phone note. ______

## 2014-02-19 NOTE — Progress Notes (Signed)
Per 7.16.15 lab result note: Notes Recorded by Rinaldo Ratel, CMA on 02/19/2014 at 11:35 AM LMOM TCB x1. Orders only encounter created for the GI referral since it's the weekend. Notes Recorded by Melvenia Needles, NP on 02/18/2014 at 5:35 PM Recent gastroenteritis w/ diarrhea  Labs show elevated eosinophil count ? Eosinophilia gastroenteritis  Refer to GI for evaluation  Will need repeat CBC in couple of weeks.  Kidney fxn is normal  Please contact office for sooner follow up if symptoms do not improve or worsen or seek emergency care

## 2014-02-19 NOTE — Telephone Encounter (Signed)
Per 7.16.15 lab result note: Notes Recorded by Melvenia Needles, NP on 02/18/2014 at 5:35 PM Recent gastroenteritis w/ diarrhea  Labs show elevated eosinophil count ? Eosinophilia gastroenteritis  Refer to GI for evaluation  Will need repeat CBC in couple of weeks.  Kidney fxn is normal  Please contact office for sooner follow up if symptoms do not improve or worsen or seek emergency care   Called spoke with patient and discussed the above lab results / recs with him as stated by TP.  Pt okay with this and verbalized his understanding.  Appt has already been scheduled with Dr Henrene Pastor for 7.28.15.  Pt was established with Dr Sharlett Iles, whom has recently retired.  Called GI, no sooner appts.  Advised pt to call the office if symptoms return - reports no symptoms currently, but has to eat frequently.  Nothing further needed; will sign off.

## 2014-02-19 NOTE — Progress Notes (Signed)
Quick Note:  LMOM TCB x1. Orders only encounter created for the GI referral since it's the weekend. ______

## 2014-03-02 ENCOUNTER — Ambulatory Visit (INDEPENDENT_AMBULATORY_CARE_PROVIDER_SITE_OTHER): Payer: BC Managed Care – PPO | Admitting: Internal Medicine

## 2014-03-02 ENCOUNTER — Telehealth: Payer: Self-pay | Admitting: Pulmonary Disease

## 2014-03-02 ENCOUNTER — Encounter: Payer: Self-pay | Admitting: Internal Medicine

## 2014-03-02 VITALS — BP 130/62 | HR 60 | Ht 71.0 in | Wt 160.2 lb

## 2014-03-02 DIAGNOSIS — K5900 Constipation, unspecified: Secondary | ICD-10-CM | POA: Diagnosis not present

## 2014-03-02 DIAGNOSIS — R109 Unspecified abdominal pain: Secondary | ICD-10-CM

## 2014-03-02 DIAGNOSIS — K624 Stenosis of anus and rectum: Secondary | ICD-10-CM

## 2014-03-02 DIAGNOSIS — I2510 Atherosclerotic heart disease of native coronary artery without angina pectoris: Secondary | ICD-10-CM

## 2014-03-02 MED ORDER — POLYETHYLENE GLYCOL 3350 17 GM/SCOOP PO POWD: ORAL | Status: DC

## 2014-03-02 NOTE — Progress Notes (Signed)
HISTORY OF PRESENT ILLNESS:  Kyle KELEMEN is a 80 y.o. male with multiple significant complicated medical problems as listed below. He presents today regarding chronic abdominal discomfort, constipation, and rectal discomfort with defecation. The patient was a previous GI patient Dr. Verl Blalock. Last seen by Dr. Sharlett Iles 07/07/2013 regarding constipation and rectal pain. He was found to have anal stenosis on physical exam which was manually dilated. He was not felt to be a candidate for colonoscopy given his comorbidities.Cologaurd was performed and returned negative. He did undergo complete colonoscopy 10/13/2003, which was entirely normal with excellent preparation. He was placed on Colace and fiber. As well given samples of Linzess. Apparently Linzess was helpful. He is not clear why he is no longer on Linzess. He does not have daily bowel movements. Blood work from 2 weeks ago was unremarkable with normal comprehensive metabolic panel. CBC did reveal leukocytosis (felt to have gastroenteritis at that time). Patient's had no further problems with nausea or vomiting. Diarrhea is resolved. Back to baseline constipation with abdominal and rectal discomfort. No bleeding.  REVIEW OF SYSTEMS:  All non-GI ROS negative except for sinus and allergy trouble, arthritis, hearing problems  Past Medical History  Diagnosis Date  . Hypertension   . CAD (coronary artery disease)     DES to LAD 2005 /  DES to LAD 2008  . LBBB (left bundle branch block)   . Cardiomyopathy     Ischemic, EF 30%, 2005  . Mitral regurgitation     Mild, echo, October, 2011  . Aortic stenosis     Mild, echo, October, 2011  . AV block, complete     Permanent pacemaker  . ICD (implantable cardiac defibrillator), single, in situ     for syncope and arrhythmia 2005 / generator change June, 2011  . Dyslipidemia   . GERD (gastroesophageal reflux disease)   . DJD (degenerative joint disease)   . Anemia, unspecified   .  Vitamin B12 deficiency   . Ejection fraction < 50%     EF 30%, echo, 2005  /  EF 30%, catheterization, 2008  . Syncope     2005, ICD placed  . Systolic CHF, chronic   . Rectal fissure   . Hyperlipidemia     Past Surgical History  Procedure Laterality Date  . Icd implanted  2005  . Laparoscopic cholecystectomy  04/2005    Dr. Lucia Gaskins  . Cardiac catheterization  2008    Social History ENIS RIECKE  reports that he quit smoking about 61 years ago. His smoking use included Cigarettes. He has a 2 pack-year smoking history. He has never used smokeless tobacco. He reports that he does not drink alcohol or use illicit drugs.  family history includes Cancer (age of onset: 40) in his mother; Lung cancer in his brother; Stroke in his brother and mother; Stroke (age of onset: 78) in his father.  No Known Allergies     PHYSICAL EXAMINATION: Vital signs: BP 130/62  Pulse 60  Ht 5\' 11"  (1.803 m)  Wt 160 lb 3.2 oz (72.666 kg)  BMI 22.35 kg/m2 General: Well-developed, well-nourished, no acute distress HEENT: Sclerae are anicteric, conjunctiva pink. Oral mucosa intact Lungs: Clear Heart: Regular Abdomen: soft, nontender, nondistended, no obvious ascites, no peritoneal signs, normal bowel sounds. No organomegaly. Extremities: No edema Psychiatric: alert and oriented x3. Cooperative    ASSESSMENT:  #1. Chronic constipation #2. Anal stenosis #3. Abdominal pain secondary to #1 above #4. Normal colonoscopy 2005. Normal cologuard December  2014. #5. Multiple significant medical problems   PLAN:  #1. Continue Colace 3 daily #2. Continue fiber #3. Add MiraLax 2 doses daily. Titrated to need #4. GI followup as needed

## 2014-03-02 NOTE — Patient Instructions (Addendum)
Continue your Colace daily.  Start over the counter Miralax mixing 2 scoops in 8 oz of water or juice daily. Titrate the Miralax depending on your bowel movements.  Please follow up with Dr. Henrene Pastor as needed.

## 2014-03-02 NOTE — Telephone Encounter (Signed)
Called and spoke with pt and he is aware that he will not need to be fasting for his appt with SN.  Pt voiced his understanding and nothing further is needed

## 2014-03-09 ENCOUNTER — Ambulatory Visit (INDEPENDENT_AMBULATORY_CARE_PROVIDER_SITE_OTHER): Payer: BC Managed Care – PPO | Admitting: Pulmonary Disease

## 2014-03-09 ENCOUNTER — Encounter: Payer: Self-pay | Admitting: Pulmonary Disease

## 2014-03-09 ENCOUNTER — Ambulatory Visit (INDEPENDENT_AMBULATORY_CARE_PROVIDER_SITE_OTHER)
Admission: RE | Admit: 2014-03-09 | Discharge: 2014-03-09 | Disposition: A | Discharge status: Home / Self Care | Payer: BC Managed Care – PPO | Source: Ambulatory Visit | Attending: Pulmonary Disease | Admitting: Pulmonary Disease

## 2014-03-09 ENCOUNTER — Ambulatory Visit: Payer: BC Managed Care – PPO | Admitting: Pulmonary Disease

## 2014-03-09 ENCOUNTER — Other Ambulatory Visit (INDEPENDENT_AMBULATORY_CARE_PROVIDER_SITE_OTHER): Payer: BC Managed Care – PPO

## 2014-03-09 VITALS — BP 90/58 | HR 50 | Temp 97.7°F | Ht 71.5 in | Wt 162.8 lb

## 2014-03-09 DIAGNOSIS — I350 Nonrheumatic aortic (valve) stenosis: Secondary | ICD-10-CM

## 2014-03-09 DIAGNOSIS — I10 Essential (primary) hypertension: Secondary | ICD-10-CM | POA: Diagnosis not present

## 2014-03-09 DIAGNOSIS — I059 Rheumatic mitral valve disease, unspecified: Secondary | ICD-10-CM

## 2014-03-09 DIAGNOSIS — I2510 Atherosclerotic heart disease of native coronary artery without angina pectoris: Secondary | ICD-10-CM

## 2014-03-09 DIAGNOSIS — M1990 Unspecified osteoarthritis, unspecified site: Secondary | ICD-10-CM

## 2014-03-09 DIAGNOSIS — I428 Other cardiomyopathies: Secondary | ICD-10-CM

## 2014-03-09 DIAGNOSIS — E538 Deficiency of other specified B group vitamins: Secondary | ICD-10-CM

## 2014-03-09 DIAGNOSIS — K219 Gastro-esophageal reflux disease without esophagitis: Secondary | ICD-10-CM

## 2014-03-09 DIAGNOSIS — I429 Cardiomyopathy, unspecified: Secondary | ICD-10-CM

## 2014-03-09 DIAGNOSIS — E785 Hyperlipidemia, unspecified: Secondary | ICD-10-CM

## 2014-03-09 DIAGNOSIS — I359 Nonrheumatic aortic valve disorder, unspecified: Secondary | ICD-10-CM | POA: Diagnosis not present

## 2014-03-09 DIAGNOSIS — I340 Nonrheumatic mitral (valve) insufficiency: Secondary | ICD-10-CM

## 2014-03-09 LAB — CBC WITH DIFFERENTIAL/PLATELET
BASOS PCT: 0.3 % (ref 0.0–3.0)
Basophils Absolute: 0 10*3/uL (ref 0.0–0.1)
Eosinophils Absolute: 1.2 10*3/uL — ABNORMAL HIGH (ref 0.0–0.7)
Eosinophils Relative: 17.5 % — ABNORMAL HIGH (ref 0.0–5.0)
HCT: 34.3 % — ABNORMAL LOW (ref 39.0–52.0)
HEMOGLOBIN: 11.6 g/dL — AB (ref 13.0–17.0)
Lymphocytes Relative: 21 % (ref 12.0–46.0)
Lymphs Abs: 1.4 10*3/uL (ref 0.7–4.0)
MCHC: 33.8 g/dL (ref 30.0–36.0)
MCV: 92.6 fl (ref 78.0–100.0)
Monocytes Absolute: 0.8 10*3/uL (ref 0.1–1.0)
Monocytes Relative: 10.9 % (ref 3.0–12.0)
NEUTROS PCT: 50.3 % (ref 43.0–77.0)
Neutro Abs: 3.5 10*3/uL (ref 1.4–7.7)
Platelets: 273 10*3/uL (ref 150.0–400.0)
RBC: 3.7 Mil/uL — AB (ref 4.22–5.81)
RDW: 13.8 % (ref 11.5–15.5)
WBC: 6.9 10*3/uL (ref 4.0–10.5)

## 2014-03-09 MED ORDER — CYANOCOBALAMIN 1000 MCG/ML IJ SOLN
1000.0000 ug | Freq: Once | INTRAMUSCULAR | Status: AC
  Administered 2014-03-09: 1000 ug via INTRAMUSCULAR

## 2014-03-09 NOTE — Patient Instructions (Signed)
Today we updated your med list in our EPIC system...    Continue your current medications the same...  Today we did your follow up CXR & CBC blood count...    We will contact you w/ the results when available...   Call for any questions...  Let's plan a follow up visit in 18mo, sooner if needed for problems.Marland KitchenMarland Kitchen

## 2014-03-09 NOTE — Progress Notes (Signed)
Subjective:    Patient ID: [name], male    DOB: [birth year], 80 y.o.   MRN: [id number]  HPI 80 y/o WM here for a follow up visit & review of mult medical problems... 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  ~  September 10, 2012:  52mo ROV & [name] continues to do well- no new complaints or concerns... We reviewed the following medical problems during today's office visit >>     HBP> on MetopER100, Altace10;  BP= 148/60 & he denies CP, palpit, dizzy, SOB, edema...    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    Hx Syncope, AV block, Implanted Defib> followed by DrTaylor w/ pacer checks every 2-89mo he says; generator changed 6/11; he denies CP/angina, dizzy/ syncope, etc...    Chol> on Simva40, Lopid600, FishOil; last FLP 1/14 showed TChol 152, TG 74, HDL 38, LDL 99    GI- GERD> he had LapChole 2006; he notes some constip & uses Benefiber which helps...    DJD> he just uses Tylenol as needed...    VitB12 defic> on B12 shots monthly... We reviewed prob list, meds, xrays and labs> see below for updates >> he had the 2013 Flu vaccine 10/13; he requests 90d refills...  ~  March 11, 2013:  44mo ROV & [name] continues to do well- no new complaints or concerns;  We reviewed the following medical problems during today's office visit >>     HBP> on MetopER100AM, Altace10PM;  BP= 120/70 & he denies CP, palpit, dizzy, SOB, edema...    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    Hx  Syncope, AV block, Implanted Defib> followed by DrTaylor (seen 2/14 w/ his StJude biventricICD working normally) w/ pacer checks every 2-64mo he says; generator changed 6/11; he denies CP/angina, dizzy/ syncope, etc...    Chol> on Simva40, Lopid600, FishOil; FLP 7/14 shows TChol 171, TG 100, HDL 47, LDL 104    GI- GERD> he had LapChole 2006; he notes some constip & uses Benefiber which helps; cannot find reference to prev colonoscopy in Epic or Centricity but he says neg colon by DrPatterson in the past...    DJD> he just uses Tylenol as needed...    VitB12 defic> on B12 shots monthly... We reviewed prob list, meds, xrays and labs> see below for updates >>   ~  September 09, 2013:  59mo ROV & [name] continues to do well- no new complaints or concerns;  We reviewed the following medical problems during today's office visit >>     HBP> on MetopER100AM, Altace10PM;  BP= 136/64 & he denies CP, palpit, dizzy, SOB, edema...    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    Hx Syncope, AV block, Implanted Defib> followed by DrTaylor (seen 2/14 w/ his StJude biventricICD working normally) w/ pacer checks every 2-51mo he says; generator changed 6/11; he denies CP/angina, dizzy/ syncope, etc...    Chol> on Simva40, Lopid600, FishOil; FLP 1/15 shows TChol 171, TG 100, HDL 47, LDL 104    GI- GERD, constip> he had LapChole 2006; he notes constip & had eval by DrPatterson; cannot find reference to prev colonoscopy in Epic or Centricity but he says neg colon 2005 by DrPatterson; Rectal exam showed anal stenosis- dilated via anoscopy; he was treated w/ Linzess & Chronulac & AnusolHC and improved...     DJD> he just uses Tylenol as needed...    VitB12 defic> on B12 shots monthly... We  reviewed prob list, meds, xrays and labs> see below for updates >>   LABS 1/15:  FLP- ok on Simva40+Lopid600;  Chems- wnl;  CBC- ok w/ Hg=12.6;  TSH=3.78;  PSA=3.47...  ~  March 09, 2014:  22mo ROV & [name] had a recent GI problem w/ lower abd pain, nausea, diarrhea=>then constipation, no bl seen; he saw TP 02/18/14 w/ labs done & all norm x WBC-16.9 w/ 57%eos; placed on Colace and Miralax and referred to GI-DrPerry; seen 03/02/14 (note reviewed) felt to have chr constipation, hx anal stenosis, and asked to continue Colace & Miralax regularly (no comment made about the eosinophilia)... He is now much improved- feeling better, energy good, still working regularly; he notes that appetite is good, swallowing is OK & he deniesCP, heartburn, reflux, etc... We did f/u CBC showing Hg=11.6 and WBC improved to 6.9 w/ 17%eos (in light of his clinical improvement, we will continue to follow)...    HBP> on MetopER100AM, Altace10PM;  BP= 100/60 & he denies CP, palpit, dizzy, SOB, edema; states feeling well & energy is good...    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    Hx Syncope, AV block, Implanted Defib> followed by DrTaylor (seen 2/15- note reviewed) w/ his StJude biventricICD working normally) w/ pacer checks every 2-50mo he says; generator changed 6/11; he denies CP/angina, dizzy/ syncope, etc...    Chol> on Simva40, Lopid600, FishOil; FLP 6/15 shows TChol 168, TG 117, HDL 41, LDL 104... No changes made, we stressed diet...    GI- GERD, constip> he had LapChole 2006; he notes constip & had eval by DrPatterson; cannot find reference to prev colonoscopy in Epic or Centricity but he says neg colon 2005 by DrPatterson; Rectal exam showed anal  stenosis- dilated via anoscopy; he was treated w/ Linzess & Chronulac & AnusolHC and improved...     DJD> he just uses Tylenol as needed...    VitB12 defic> on B12 shots monthly... We reviewed prob list, meds, xrays and labs> see below for updates >> Given B12 shot today...   LABS 6/15 & 7/15 showed> FLP- fair w/ LDL=104;  Chems- wnl;  CBC- ok x WBC=16.9 w/ 57%eos; f/u CBC 8/15 showed Hg=11.6, WBC=6.9 w/ 17%eos...  CXR 8/15 showed norm heart size, Pacer/ICD stable, stent noted, lungs clear, NAD.Marland KitchenMarland Kitchen            Problem List:  HYPERTENSION (ICD-401.9) - controlled on TOPROL XL 100mg /d &  ALTACE 10mg /d... takes meds regularly and tolerates well...  ~  7/12: BP= 120/68 and OK at home per pt; he gets rare HA which he believes is related to his BP; denies fatigue, visual changes, CP, palipit, dizziness, syncope, dyspnea, edema, etc... ~  CXR 9/12 showed dual lead pacer/ AICD, norm heart size, clear lungs, NAD... ~  2/13:  BP= 124/68 & he remains asymptomatic... ~  8/13:  BP= 122/58 & he denies CP, palpit, SOB, edema, etc... ~  2/14: on MetopER100, Altace10;  BP= 148/60 & he denies CP, palpit, dizzy, SOB, edema. ~  8/14: on MetopER100 in AM, Altace10 in PM;  BP= 120/70 & he denies CP, palpit, dizzy, SOB, edema. ~  2/15: on MetopER100 & Altace10; BP= 136/64 & he remains asymptomatic... ~  8/15: on MetopER100AM, Altace10PM;  BP= 100/60 & he denies CP, palpit, dizzy, SOB, edema; states feeling well & energy is good ~  CXR 8/15 showed norm heart size, Pacer/ICD stable, stent noted, lungs clear, NAD.  CAD (ICD-414.00) - on ASA 81mg /d & PLAVIX 75mg /d> followed by Benay Spice... ISCHEMIC CARDIOMYOPATHY (ICD-414.8) - he's had prev MI w/ mult caths/ interventions. CHRONIC SYSTOLIC HEART FAILURE (VVO-160.73) AORTIC STENOSIS (ICD-424.1) > Mild by 2DEcho 10/11 MITRAL REGURGITATION (ICD-396.3) ~  last cath 5/08 showed norm Lmain, patent stent in prox LAD, 80-90% lesion in mid-LAD w/ PCI & stent placed, & 50% ostial  stenosis of 1st diagonal branch of LAD; 40%mid & 70%distal CIRC; 50%prox 40%mid 40%distal RCA; Global HK w/EF=30%. ~  NuclearStressTest 4/09 showed infer scar, no ischemia, EF=40%. ~  2DEcho 10/11 showed mild LVH, decr LVF w/ EF= 35-40% & mild HK, gr1DD, mild AS & MR, mild LAdil. ~  EKG 10/12 showed pacer rhythm... 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  Hx of SYNCOPE (ICD-780.2) AV BLOCK, COMPLETE (ICD-426.0) AUTOMATIC IMPLANTABLE CARDIAC DEFIBRILLATOR SITU (ICD-V45.02) ~  Pacer/ ICD per DrTaylor w/ several adjustments in 2008... ~  5/11:  f/u DrTaylor w/ decision for elective generator change> done 6/11 & tol well. ~  He continues to f/u w/ DrTaylor every 6 months... ~  2/13: f/u DrTaylor doing satis, device working normally, no changes made & f/u 41yr... ~  2/14: Hx Syncope, AV block, Implanted Defib> followed by DrTaylor w/ pacer checks every 2-64mo he says; generator changed 6/11; he denies CP/angina, dizzy/ syncope, etc. ~  2/15: he had f/u DrTaylor> stable & doing satis...  HYPERCHOLESTEROLEMIA (ICD-272.0) - takes SIMVASTATIN 40mg /d, GEMFIBRIZOL 600mg /d, & FISH OIL Bid... ~  [place] 08/14/07 on Simva40 showed TChol 163, TG 94, HDL 34, LDL 111... rec incr Simvastatin to 80mg /d. ~  FLP 02/24/08 on Simva80 showed TChol 136, TG 92, HDL 38, LDL 80... continue same. ~  FLP 1/10 showed TChol 125, TG 67, HDL 32, LDL 80... rec- continue same. ~  FLP 7/10 showed TChol 124, TG 86, HDL 36, LDL 71 ~  FLP 1/11 on Simva80+Gemfib600 showed TChol 137, TG 99, HDL 39, LDL  78 ~  FLP 8/11 on  Simva80+Gemfib600 showed TChol 134, TG 82, HDL 36, LDL 82... Simva decr to 40. ~  FLP 1/12 on Simva40+Gemfib600 showed TChol 157, TG 139, HDL 38, LDL 92 ~  FLP 7/12 on Simva40+Gemfib600 showed TChol 163, TG 202, HDL 43, LDL 96... Needs better low fat diet. ~  FLP 1/13 on Simva40+Gemfib600 showed TChol 149, TG 50, HDL 39, LDL 100 ~  FLP 7/13 on Simva40+Gemfib600 showed TChol 172, TG 70, HDL 48, LDL 110 ~  FLP 1/14 on Simva40+Gemfib600 showed TChol 152, TG 74, HDL 38, LDL 99  ~  FLP 8/14 on Simva40+Gemfib600 showed TChol 171, TG 100, HDL 47, LDL 104 ~  FLP 1/15 on Simva40+Gemfib600 showed TChol 159, TG 64, HDL 43, LDL 103 ~  FLP 6/15 on Simva40+Gemfib600 showed TChol 168, TG 117, HDL 41, LDL 104   GERD (ICD-530.81) - he had ERCP in 8/06 w/ norm ducts, +gallstones, s/p lap chole 2006... he uses OTC meds Prn... ~  he notes that he had a colonoscopy in the past from DrPatterson, & he reports that it was neg. ~  7/15> GI eval from DrPerry, he didn't cooment on the eosinophilia...  DEGENERATIVE JOINT DISEASE (ICD-715.90) - he tried Mobic but it bothered his stomach, therefore uses Tylenol Prn.  VITAMIN B12 DEFICIENCY (ICD-266.2) - old chart not available- ? details of this Dx... on B12 shots xyrs. ~  labs 1/09 showed Hg=12.9 ~  labs 1/10 showed Hg= 12.7 ~  labs 1/11 showed Hg= 12.6 ~  labs 1/12 showed Hg= 13.2 ~  Labs 1/13 showed Hg= 12.6 ~  Labs 1/14 showed Hg= 12.7 ~  Labs 1/15 showed Hg= 12.6, WBC=5.7 w/ 10%eos ~  Labs 7/15 showed Hg= 11.7, WBC=16.9 w/ 57%eos... ~  Labs 8/15 showed Hg=11.6, WBC=6.9, w/ 17%eos...  Health Maintenance: ~  GI:  Followed by DrPatterson ~  GU:  PSA checked yearly & remains wnl... ~  Immunizations:  he gets the yearly Flu vaccine;  [place] he says;  given Pneumovax 1/11 age 51.   Past Surgical History  Procedure Laterality Date  . Icd implanted  2005  . Laparoscopic cholecystectomy  04/2005    Dr. Lucia Gaskins   . Cardiac catheterization  2008    Outpatient Encounter Prescriptions as of 03/09/2014  Medication Sig  . aspirin 81 MG tablet Take 81 mg by mouth daily.    . clopidogrel (PLAVIX) 75 MG tablet TAKE 1 TABLET DAILY  . cyanocobalamin (,VITAMIN B-12,) 1000 MCG/ML injection Inject 1,000 mcg into the muscle every 30 (thirty) days.    Marland Kitchen docusate sodium (COLACE) 100 MG capsule Take 100 mg by mouth 3 (three) times daily.  . fish oil-omega-3 fatty acids 1000 MG capsule Take 2 g by mouth daily.    Marland Kitchen gemfibrozil (LOPID) 600 MG tablet TAKE 1 TABLET DAILY  . metoprolol succinate (TOPROL-XL) 100 MG 24 hr tablet TAKE 1 TABLET DAILY  . Multiple Vitamins-Minerals (MENS MULTIVITAMIN PLUS PO) Take 1 tablet by mouth daily.    . polyethylene glycol powder (GLYCOLAX/MIRALAX) powder 2 scoops daily  . ramipril (ALTACE) 10 MG capsule TAKE 1 CAPSULE DAILY  . simvastatin (ZOCOR) 40 MG tablet TAKE 1 TABLET EVERY EVENING    No Known Allergies   Current Medications, Allergies, Past Medical History, Past Surgical History, Family History, and Social History were reviewed in [employer] record.    Review of Systems        See HPI - all other systems neg  except as noted...  The patient complains of dyspnea on exertion.  The patient denies anorexia, fever, weight loss, weight gain, vision loss, decreased hearing, hoarseness, chest pain, syncope, peripheral edema, prolonged cough, headaches, hemoptysis, abdominal pain, melena, hematochezia, severe indigestion/heartburn, hematuria, incontinence, muscle weakness, suspicious skin lesions, transient blindness, difficulty walking, depression, unusual weight change, abnormal bleeding, enlarged lymph nodes, and angioedema.     Objective:   Physical Exam     WD, WN, 80 y/o WM in NAD... wt=170# ht=6'1" BMI=22... GENERAL:  Alert & oriented; pleasant & cooperative... HEENT:  Miner/AT, EOM-wnl, PERRLA, EACs-clear, TMs-wnl, NOSE-clear, THROAT-clear &  wnl. NECK:  Supple w/ fairROM; no JVD; normal carotid impulses w/o bruits; no thyromegaly or nodules palpated; no lymphadenopathy. CHEST:  Clear to P & A; without wheezes/ rales/ or rhonchi heard... HEART:  Pacer in L upper chest area~ nontender, RR, gr 1/6 SEM without rubs or gallops detected... ABDOMEN:  Soft & nontender; normal bowel sounds; no organomegaly or masses palpated... EXT: without deformities, mild arthritic changes; no varicose veins/ venous insuffic/ or edema. NEURO:  CN's intact; no focal neuro deficits apprec... DERM:  No lesions noted; no rash etc...  RADIOLOGY DATA:  Reviewed in the EPIC EMR & discussed w/ the patient...   LABORATORY DATA:  Reviewed in the EPIC EMR & discussed w/ the patient...     Assessment & Plan:    HBP>  Controlled on Metoprolol & Ramipril, continue same...  CAD, Cardiomyopathy, CHF, ValvHeartDis w/ AS & MR>  Followed by DrKatz/ [name] & stable on ASA/ Plavix, still working 3d per week etc...  Hx syncope, AVBlock, Pacer/AICD>  Followed by DrTaylor & stable, continue same...  LIPIDS>  FLP looks OK on Simva40, Gemfib 600mg /d, & Fish Oil...  GI symptoms 7/15>  Resolved w/ Colace & Miralax... He feels he is back to baseline...  DJD>  He uses Tylenol as needed; still working regularly...  B-12 defic>  He remains on B12 shots monthly...   Patient's Medications  New Prescriptions   No medications on file  Previous Medications   ASPIRIN 81 MG TABLET    Take 81 mg by mouth daily.     CLOPIDOGREL (PLAVIX) 75 MG TABLET    TAKE 1 TABLET DAILY   CYANOCOBALAMIN (,VITAMIN B-12,) 1000 MCG/ML INJECTION    Inject 1,000 mcg into the muscle every 30 (thirty) days.     DOCUSATE SODIUM (COLACE) 100 MG CAPSULE    Take 100 mg by mouth 3 (three) times daily.   FISH OIL-OMEGA-3 FATTY ACIDS 1000 MG CAPSULE    Take 2 g by mouth daily.     GEMFIBROZIL (LOPID) 600 MG TABLET    TAKE 1 TABLET DAILY   METOPROLOL SUCCINATE (TOPROL-XL) 100 MG 24 HR TABLET    TAKE 1  TABLET DAILY   MULTIPLE VITAMINS-MINERALS (MENS MULTIVITAMIN PLUS PO)    Take 1 tablet by mouth daily.     POLYETHYLENE GLYCOL POWDER (GLYCOLAX/MIRALAX) POWDER    2 scoops daily   RAMIPRIL (ALTACE) 10 MG CAPSULE    TAKE 1 CAPSULE DAILY   SIMVASTATIN (ZOCOR) 40 MG TABLET    TAKE 1 TABLET EVERY EVENING  Modified Medications   No medications on file  Discontinued Medications   No medications on file

## 2014-03-12 ENCOUNTER — Ambulatory Visit: Payer: BC Managed Care – PPO

## 2014-03-24 ENCOUNTER — Ambulatory Visit (INDEPENDENT_AMBULATORY_CARE_PROVIDER_SITE_OTHER): Payer: BC Managed Care – PPO | Admitting: *Deleted

## 2014-03-24 DIAGNOSIS — I428 Other cardiomyopathies: Secondary | ICD-10-CM

## 2014-03-24 DIAGNOSIS — I429 Cardiomyopathy, unspecified: Secondary | ICD-10-CM

## 2014-03-24 DIAGNOSIS — I5022 Chronic systolic (congestive) heart failure: Secondary | ICD-10-CM

## 2014-03-24 NOTE — Progress Notes (Signed)
Remote ICD transmission.   

## 2014-04-01 ENCOUNTER — Encounter: Payer: Self-pay | Admitting: Internal Medicine

## 2014-04-01 DIAGNOSIS — I428 Other cardiomyopathies: Secondary | ICD-10-CM

## 2014-04-01 LAB — MDC_IDC_ENUM_SESS_TYPE_REMOTE
Brady Statistic RV Percent Paced: 99 %
Lead Channel Impedance Value: 360 Ohm
Lead Channel Impedance Value: 430 Ohm
Lead Channel Pacing Threshold Amplitude: 0.75 V
Lead Channel Sensing Intrinsic Amplitude: 1.5 mV
MDC IDC MSMT LEADCHNL RV PACING THRESHOLD PULSEWIDTH: 0.5 ms
MDC IDC MSMT LEADCHNL RV SENSING INTR AMPL: 10.2 mV
MDC IDC PG SERIAL: 610170
MDC IDC STAT BRADY RA PERCENT PACED: 31 %

## 2014-04-09 ENCOUNTER — Ambulatory Visit (INDEPENDENT_AMBULATORY_CARE_PROVIDER_SITE_OTHER): Payer: BC Managed Care – PPO

## 2014-04-09 DIAGNOSIS — E538 Deficiency of other specified B group vitamins: Secondary | ICD-10-CM

## 2014-04-15 ENCOUNTER — Encounter: Payer: Self-pay | Admitting: Cardiology

## 2014-04-17 ENCOUNTER — Other Ambulatory Visit: Payer: Self-pay | Admitting: Pulmonary Disease

## 2014-04-20 MED ORDER — CYANOCOBALAMIN 1000 MCG/ML IJ SOLN
1000.0000 ug | Freq: Once | INTRAMUSCULAR | Status: AC
  Administered 2014-04-20: 1000 ug via INTRAMUSCULAR

## 2014-05-07 ENCOUNTER — Ambulatory Visit (INDEPENDENT_AMBULATORY_CARE_PROVIDER_SITE_OTHER): Payer: BC Managed Care – PPO

## 2014-05-07 DIAGNOSIS — E538 Deficiency of other specified B group vitamins: Secondary | ICD-10-CM | POA: Diagnosis not present

## 2014-05-20 MED ORDER — CYANOCOBALAMIN 1000 MCG/ML IJ SOLN
1000.0000 ug | Freq: Once | INTRAMUSCULAR | Status: AC
  Administered 2014-05-20: 1000 ug via INTRAMUSCULAR

## 2014-06-04 ENCOUNTER — Ambulatory Visit (INDEPENDENT_AMBULATORY_CARE_PROVIDER_SITE_OTHER): Payer: BC Managed Care – PPO

## 2014-06-04 ENCOUNTER — Telehealth: Payer: Self-pay | Admitting: Pulmonary Disease

## 2014-06-04 DIAGNOSIS — E538 Deficiency of other specified B group vitamins: Secondary | ICD-10-CM

## 2014-06-08 MED ORDER — METOPROLOL SUCCINATE ER 100 MG PO TB24: ORAL_TABLET | ORAL | Status: DC

## 2014-06-08 MED ORDER — CLOPIDOGREL BISULFATE 75 MG PO TABS: ORAL_TABLET | ORAL | Status: DC

## 2014-06-08 MED ORDER — SIMVASTATIN 40 MG PO TABS: ORAL_TABLET | ORAL | Status: DC

## 2014-06-08 MED ORDER — RAMIPRIL 10 MG PO CAPS: ORAL_CAPSULE | ORAL | Status: DC

## 2014-06-08 MED ORDER — GEMFIBROZIL 600 MG PO TABS: ORAL_TABLET | ORAL | Status: DC

## 2014-06-08 NOTE — Telephone Encounter (Signed)
LMTC x 1  

## 2014-06-08 NOTE — Telephone Encounter (Signed)
These rx have been sent in to the mail order and nothing further is needed.

## 2014-06-14 MED ORDER — CYANOCOBALAMIN 1000 MCG/ML IJ SOLN
1000.0000 ug | Freq: Once | INTRAMUSCULAR | Status: AC
  Administered 2014-06-14: 1000 ug via INTRAMUSCULAR

## 2014-06-28 ENCOUNTER — Ambulatory Visit (INDEPENDENT_AMBULATORY_CARE_PROVIDER_SITE_OTHER): Payer: BC Managed Care – PPO | Admitting: *Deleted

## 2014-06-28 ENCOUNTER — Encounter: Payer: Self-pay | Admitting: Internal Medicine

## 2014-06-28 DIAGNOSIS — I5022 Chronic systolic (congestive) heart failure: Secondary | ICD-10-CM | POA: Diagnosis not present

## 2014-06-28 DIAGNOSIS — I429 Cardiomyopathy, unspecified: Secondary | ICD-10-CM

## 2014-06-28 LAB — MDC_IDC_ENUM_SESS_TYPE_REMOTE
Battery Remaining Longevity: 3.7
Brady Statistic RA Percent Paced: 32 %
Brady Statistic RV Percent Paced: 99 %
HighPow Impedance: 39 Ohm
Implantable Pulse Generator Serial Number: 610170
Lead Channel Impedance Value: 410 Ohm
Lead Channel Sensing Intrinsic Amplitude: 1.7 mV
Lead Channel Setting Pacing Amplitude: 2 V
MDC IDC MSMT BATTERY REMAINING PERCENTAGE: 44 %
MDC IDC MSMT LEADCHNL RV IMPEDANCE VALUE: 340 Ohm
MDC IDC MSMT LEADCHNL RV PACING THRESHOLD AMPLITUDE: 0.875 V
MDC IDC MSMT LEADCHNL RV PACING THRESHOLD PULSEWIDTH: 0.4 ms
MDC IDC SET LEADCHNL RA PACING AMPLITUDE: 2 V
MDC IDC SET LEADCHNL RV PACING PULSEWIDTH: 0.5 ms
MDC IDC SET LEADCHNL RV SENSING SENSITIVITY: 0.5 mV
MDC IDC SET ZONE DETECTION INTERVAL: 300 ms
Zone Setting Detection Interval: 375 ms

## 2014-06-28 NOTE — Progress Notes (Signed)
Remote ICD transmission.   

## 2014-06-30 ENCOUNTER — Encounter: Payer: Self-pay | Admitting: Cardiology

## 2014-07-02 ENCOUNTER — Ambulatory Visit: Payer: BC Managed Care – PPO

## 2014-07-05 ENCOUNTER — Ambulatory Visit (INDEPENDENT_AMBULATORY_CARE_PROVIDER_SITE_OTHER): Payer: BC Managed Care – PPO

## 2014-07-05 DIAGNOSIS — E538 Deficiency of other specified B group vitamins: Secondary | ICD-10-CM | POA: Diagnosis not present

## 2014-07-06 MED ORDER — CYANOCOBALAMIN 1000 MCG/ML IJ SOLN
1000.0000 ug | Freq: Once | INTRAMUSCULAR | Status: AC
  Administered 2014-07-05: 1000 ug via INTRAMUSCULAR

## 2014-08-02 ENCOUNTER — Ambulatory Visit (INDEPENDENT_AMBULATORY_CARE_PROVIDER_SITE_OTHER): Payer: BC Managed Care – PPO

## 2014-08-02 DIAGNOSIS — E538 Deficiency of other specified B group vitamins: Secondary | ICD-10-CM

## 2014-08-03 MED ORDER — CYANOCOBALAMIN 1000 MCG/ML IJ SOLN
1000.0000 ug | Freq: Once | INTRAMUSCULAR | Status: AC
  Administered 2014-08-02: 1000 ug via INTRAMUSCULAR

## 2014-08-09 ENCOUNTER — Ambulatory Visit: Payer: BC Managed Care – PPO

## 2014-08-14 ENCOUNTER — Emergency Department (HOSPITAL_BASED_OUTPATIENT_CLINIC_OR_DEPARTMENT_OTHER)
Admission: EM | Admit: 2014-08-14 | Discharge: 2014-08-14 | Disposition: A | Discharge status: Home / Self Care | Payer: Worker's Compensation | Attending: Emergency Medicine | Admitting: Emergency Medicine

## 2014-08-14 ENCOUNTER — Encounter (HOSPITAL_BASED_OUTPATIENT_CLINIC_OR_DEPARTMENT_OTHER): Payer: Self-pay | Admitting: *Deleted

## 2014-08-14 ENCOUNTER — Emergency Department (HOSPITAL_BASED_OUTPATIENT_CLINIC_OR_DEPARTMENT_OTHER): Payer: Worker's Compensation

## 2014-08-14 DIAGNOSIS — I10 Essential (primary) hypertension: Secondary | ICD-10-CM | POA: Diagnosis not present

## 2014-08-14 DIAGNOSIS — I2510 Atherosclerotic heart disease of native coronary artery without angina pectoris: Secondary | ICD-10-CM | POA: Diagnosis not present

## 2014-08-14 DIAGNOSIS — Y92096 Garden or yard of other non-institutional residence as the place of occurrence of the external cause: Secondary | ICD-10-CM | POA: Diagnosis not present

## 2014-08-14 DIAGNOSIS — W01198A Fall on same level from slipping, tripping and stumbling with subsequent striking against other object, initial encounter: Secondary | ICD-10-CM | POA: Diagnosis not present

## 2014-08-14 DIAGNOSIS — I5022 Chronic systolic (congestive) heart failure: Secondary | ICD-10-CM | POA: Insufficient documentation

## 2014-08-14 DIAGNOSIS — Z7902 Long term (current) use of antithrombotics/antiplatelets: Secondary | ICD-10-CM | POA: Insufficient documentation

## 2014-08-14 DIAGNOSIS — Y9389 Activity, other specified: Secondary | ICD-10-CM | POA: Diagnosis not present

## 2014-08-14 DIAGNOSIS — Z8719 Personal history of other diseases of the digestive system: Secondary | ICD-10-CM | POA: Insufficient documentation

## 2014-08-14 DIAGNOSIS — Z79899 Other long term (current) drug therapy: Secondary | ICD-10-CM | POA: Diagnosis not present

## 2014-08-14 DIAGNOSIS — E538 Deficiency of other specified B group vitamins: Secondary | ICD-10-CM | POA: Diagnosis not present

## 2014-08-14 DIAGNOSIS — M1990 Unspecified osteoarthritis, unspecified site: Secondary | ICD-10-CM | POA: Diagnosis not present

## 2014-08-14 DIAGNOSIS — W19XXXA Unspecified fall, initial encounter: Secondary | ICD-10-CM

## 2014-08-14 DIAGNOSIS — S2232XA Fracture of one rib, left side, initial encounter for closed fracture: Secondary | ICD-10-CM | POA: Diagnosis not present

## 2014-08-14 DIAGNOSIS — D649 Anemia, unspecified: Secondary | ICD-10-CM | POA: Insufficient documentation

## 2014-08-14 DIAGNOSIS — Y990 Civilian activity done for income or pay: Secondary | ICD-10-CM | POA: Diagnosis not present

## 2014-08-14 DIAGNOSIS — Z95810 Presence of automatic (implantable) cardiac defibrillator: Secondary | ICD-10-CM | POA: Insufficient documentation

## 2014-08-14 DIAGNOSIS — Z9889 Other specified postprocedural states: Secondary | ICD-10-CM | POA: Diagnosis not present

## 2014-08-14 DIAGNOSIS — Z87891 Personal history of nicotine dependence: Secondary | ICD-10-CM | POA: Diagnosis not present

## 2014-08-14 DIAGNOSIS — E785 Hyperlipidemia, unspecified: Secondary | ICD-10-CM | POA: Insufficient documentation

## 2014-08-14 DIAGNOSIS — Z7982 Long term (current) use of aspirin: Secondary | ICD-10-CM | POA: Insufficient documentation

## 2014-08-14 DIAGNOSIS — S299XXA Unspecified injury of thorax, initial encounter: Secondary | ICD-10-CM | POA: Diagnosis present

## 2014-08-14 MED ORDER — TRAMADOL HCL 50 MG PO TABS: 50.0000 mg | ORAL_TABLET | Freq: Four times a day (QID) | ORAL | Status: DC | PRN

## 2014-08-14 NOTE — ED Provider Notes (Signed)
CSN: [id number]     Arrival date & time 08/14/14  7412 History   First MD Initiated Contact with Patient 08/14/14 [phone]     Chief Complaint  Patient presents with  . Fall     (Consider location/radiation/quality/duration/timing/severity/associated sxs/prior Treatment) HPI Comments: Patient complains of pain to his left ribs. He states that 2 days ago he was pulling a piece of yard work and slipped and fell hitting his left ribs on a piece of board. He denies any other injuries. He did not hit his head. There is no loss consciousness. He denies any neck or back pain. He's had some constant throbbing pain to his ribs since that time. It's worse with breathing and coughing. He denies any shortness of breath. He's been taking Tylenol with some improvement of symptoms.  Patient is a 81 y.o. male presenting with fall.  Fall Associated symptoms include chest pain (rib pain). Pertinent negatives include no abdominal pain, no headaches and no shortness of breath.    Past Medical History  Diagnosis Date  . Hypertension   . CAD (coronary artery disease)     DES to LAD 2005 /  DES to LAD 2008  . LBBB (left bundle branch block)   . Cardiomyopathy     Ischemic, EF 30%, 2005  . Mitral regurgitation     Mild, echo, October, 2011  . Aortic stenosis     Mild, echo, October, 2011  . AV block, complete     Permanent pacemaker  . ICD (implantable cardiac defibrillator), single, in situ     for syncope and arrhythmia 2005 / generator change June, 2011  . Dyslipidemia   . GERD (gastroesophageal reflux disease)   . DJD (degenerative joint disease)   . Anemia, unspecified   . Vitamin B12 deficiency   . Ejection fraction < 50%     EF 30%, echo, 2005  /  EF 30%, catheterization, 2008  . Syncope     2005, ICD placed  . Systolic CHF, chronic   . Rectal fissure   . Hyperlipidemia    Past Surgical History  Procedure Laterality Date  . Icd implanted  2005  . Laparoscopic cholecystectomy  04/2005   Dr. Lucia Gaskins  . Cardiac catheterization  2008   Family History  Problem Relation Age of Onset  . Stroke Brother   . Lung cancer Brother   . Stroke Mother   . Cancer Mother 9  . Stroke Father 1   History  Substance Use Topics  . Smoking status: Former Smoker -- 1.00 packs/day for 2 years    Types: Cigarettes    Quit date: 08/06/1952  . Smokeless tobacco: Never Used  . Alcohol Use: No    Review of Systems  Constitutional: Negative for fever, chills, diaphoresis and fatigue.  HENT: Negative for congestion, rhinorrhea and sneezing.   Eyes: Negative.   Respiratory: Negative for cough, chest tightness and shortness of breath.   Cardiovascular: Positive for chest pain (rib pain). Negative for leg swelling.  Gastrointestinal: Negative for nausea, vomiting, abdominal pain, diarrhea and blood in stool.  Genitourinary: Negative for frequency, hematuria, flank pain and difficulty urinating.  Musculoskeletal: Negative for back pain and arthralgias.  Skin: Negative for rash.  Neurological: Negative for dizziness, speech difficulty, weakness, numbness and headaches.      Allergies  Review of patient's allergies indicates no known allergies.  Home Medications   Prior to Admission medications   Medication Sig Start Date End Date Taking? Authorizing Provider  aspirin  81 MG tablet Take 81 mg by mouth daily.      Historical Provider, MD  clopidogrel (PLAVIX) 75 MG tablet TAKE 1 TABLET DAILY 06/08/14   Noralee Space, MD  cyanocobalamin (,VITAMIN B-12,) 1000 MCG/ML injection Inject 1,000 mcg into the muscle every 30 (thirty) days.      Historical Provider, MD  docusate sodium (COLACE) 100 MG capsule Take 100 mg by mouth 3 (three) times daily.    Historical Provider, MD  fish oil-omega-3 fatty acids 1000 MG capsule Take 2 g by mouth daily.      Historical Provider, MD  gemfibrozil (LOPID) 600 MG tablet TAKE 1 TABLET DAILY 06/08/14   Noralee Space, MD  metoprolol succinate (TOPROL-XL) 100 MG 24  hr tablet TAKE 1 TABLET DAILY 06/08/14   Noralee Space, MD  Multiple Vitamins-Minerals (MENS MULTIVITAMIN PLUS PO) Take 1 tablet by mouth daily.      Historical Provider, MD  polyethylene glycol powder (GLYCOLAX/MIRALAX) powder 2 scoops daily 03/02/14   Irene Shipper, MD  ramipril (ALTACE) 10 MG capsule TAKE 1 CAPSULE DAILY 06/08/14   Noralee Space, MD  simvastatin (ZOCOR) 40 MG tablet TAKE 1 TABLET EVERY EVENING 06/08/14   Noralee Space, MD  traMADol (ULTRAM) 50 MG tablet Take 1 tablet (50 mg total) by mouth every 6 (six) hours as needed. 08/14/14   Malvin Johns, MD   BP 125/66 mmHg  Pulse 50  Temp(Src) 98.2 F (36.8 C) (Oral)  Resp 18  Ht 5\' 9"  (1.753 m)  Wt 160 lb (72.576 kg)  BMI 23.62 kg/m2  SpO2 99% Physical Exam  Constitutional: He is oriented to person, place, and time. He appears well-developed and well-nourished.  HENT:  Head: Normocephalic and atraumatic.  Eyes: Pupils are equal, round, and reactive to light.  Neck: Normal range of motion. Neck supple.  Cardiovascular: Normal rate, regular rhythm and normal heart sounds.   Pulmonary/Chest: Effort normal and breath sounds normal. No respiratory distress. He has no wheezes. He has no rales. He exhibits tenderness (Positive ecchymosis and tenderness to the left lower posterior ribs. No crepitus or deformity.).  Abdominal: Soft. Bowel sounds are normal. There is no tenderness. There is no rebound and no guarding.  No pain around the spleen or liver  Musculoskeletal: Normal range of motion. He exhibits no edema.  No pain along the spine  Lymphadenopathy:    He has no cervical adenopathy.  Neurological: He is alert and oriented to person, place, and time.  Skin: Skin is warm and dry. No rash noted.  Psychiatric: He has a normal mood and affect.    ED Course  Procedures (including critical care time) Labs Review Labs Reviewed - No data to display  Imaging Review Dg Ribs Unilateral W/chest Left  08/14/2014   CLINICAL DATA:   Status post fall; landed on left posterior ribs, with pain on inspiration and movement. Initial encounter.  EXAM: LEFT RIBS AND CHEST - 3+ VIEW  COMPARISON:  Chest radiograph performed 03/09/2014  FINDINGS: The lungs are well-aerated. Mild peribronchial thickening is noted. There is no evidence of focal opacification, pleural effusion or pneumothorax.  The cardiomediastinal silhouette is normal in size. A pacemaker/AICD is noted overlying the left chest wall, with leads ending overlying the right atrium and right ventricle. There is suggestion of a minimally displaced fracture of the left anterolateral sixth rib. Multiple chronic left posterior rib deformities are seen.  IMPRESSION: 1. Suggestion of minimally displaced fracture of the left anterolateral sixth  rib. This could also be artifactual in nature. Multiple chronic left posterior rib deformities seen. 2. Mild peribronchial thickening noted; lungs otherwise clear.   Electronically Signed   By: Garald Balding M.D.   On: 08/14/2014 09:05     EKG Interpretation None      MDM   Final diagnoses:  Fall  Fracture of rib of left side, closed, initial encounter    Patient is given an incentive spirometer to use. There is no evidence of a pneumothorax. His pain seems to be fairly well controlled but he was given a prescription for Ultram to use in addition to the Tylenol that he is using at home. He was encouraged to have a follow-up with his primary care physician. Return precautions were given.    Malvin Johns, MD 08/14/14 [phone]

## 2014-08-14 NOTE — Discharge Instructions (Signed)

## 2014-08-14 NOTE — ED Notes (Signed)
Patient c/o L side rib pain resulting from fall at work on Thursday, no LOC

## 2014-08-27 ENCOUNTER — Ambulatory Visit: Payer: BC Managed Care – PPO

## 2014-08-30 ENCOUNTER — Telehealth: Payer: Self-pay | Admitting: Pulmonary Disease

## 2014-08-30 DIAGNOSIS — E785 Hyperlipidemia, unspecified: Secondary | ICD-10-CM

## 2014-08-30 DIAGNOSIS — D649 Anemia, unspecified: Secondary | ICD-10-CM

## 2014-08-30 DIAGNOSIS — F419 Anxiety disorder, unspecified: Secondary | ICD-10-CM

## 2014-08-30 DIAGNOSIS — I10 Essential (primary) hypertension: Secondary | ICD-10-CM

## 2014-08-30 DIAGNOSIS — N320 Bladder-neck obstruction: Secondary | ICD-10-CM

## 2014-08-30 NOTE — Telephone Encounter (Signed)
Pt has 6 month f/u 09/11/14 w/ SN. Please advise what labs pt will need. thanks

## 2014-09-01 NOTE — Telephone Encounter (Signed)
Labs have been placed in the computer for the pt to do prior to his appt.  Called and spoke with pt and he is aware and nothing further is needed.

## 2014-09-01 NOTE — Telephone Encounter (Signed)
Pt has not heard from our office yet in regards to lab work. Please call pt to let him know he needs it or does not. cb at [phone]

## 2014-09-01 NOTE — Telephone Encounter (Signed)
Please advise SN thanks 

## 2014-09-03 ENCOUNTER — Other Ambulatory Visit (INDEPENDENT_AMBULATORY_CARE_PROVIDER_SITE_OTHER): Payer: BLUE CROSS/BLUE SHIELD

## 2014-09-03 DIAGNOSIS — N320 Bladder-neck obstruction: Secondary | ICD-10-CM

## 2014-09-03 DIAGNOSIS — D649 Anemia, unspecified: Secondary | ICD-10-CM

## 2014-09-03 DIAGNOSIS — F419 Anxiety disorder, unspecified: Secondary | ICD-10-CM

## 2014-09-03 DIAGNOSIS — E785 Hyperlipidemia, unspecified: Secondary | ICD-10-CM

## 2014-09-03 DIAGNOSIS — I10 Essential (primary) hypertension: Secondary | ICD-10-CM

## 2014-09-03 LAB — LIPID PANEL
CHOL/HDL RATIO: 3
Cholesterol: 148 mg/dL (ref 0–200)
HDL: 45 mg/dL (ref >39.00)
LDL Cholesterol: 81 mg/dL (ref 0–99)
NonHDL: 103
Triglycerides: 111 mg/dL (ref 0.0–149.0)
VLDL: 22.2 mg/dL (ref 0.0–40.0)

## 2014-09-03 LAB — CBC WITH DIFFERENTIAL/PLATELET
BASOS PCT: 0.6 % (ref 0.0–3.0)
Basophils Absolute: 0 10*3/uL (ref 0.0–0.1)
EOS PCT: 9.1 % — AB (ref 0.0–5.0)
Eosinophils Absolute: 0.4 10*3/uL (ref 0.0–0.7)
HCT: 36.5 % — ABNORMAL LOW (ref 39.0–52.0)
Hemoglobin: 12.7 g/dL — ABNORMAL LOW (ref 13.0–17.0)
LYMPHS ABS: 1.2 10*3/uL (ref 0.7–4.0)
Lymphocytes Relative: 27.4 % (ref 12.0–46.0)
MCHC: 34.7 g/dL (ref 30.0–36.0)
MCV: 91.3 fl (ref 78.0–100.0)
MONOS PCT: 12.2 % — AB (ref 3.0–12.0)
Monocytes Absolute: 0.5 10*3/uL (ref 0.1–1.0)
NEUTROS ABS: 2.3 10*3/uL (ref 1.4–7.7)
NEUTROS PCT: 50.7 % (ref 43.0–77.0)
Platelets: 223 10*3/uL (ref 150.0–400.0)
RBC: 3.99 Mil/uL — AB (ref 4.22–5.81)
RDW: 13.8 % (ref 11.5–15.5)
WBC: 4.4 10*3/uL (ref 4.0–10.5)

## 2014-09-03 LAB — BASIC METABOLIC PANEL
BUN: 25 mg/dL — AB (ref 6–23)
CALCIUM: 9.8 mg/dL (ref 8.4–10.5)
CHLORIDE: 104 meq/L (ref 96–112)
CO2: 26 meq/L (ref 19–32)
CREATININE: 1.08 mg/dL (ref 0.40–1.50)
GFR: 69.89 mL/min (ref >60.00)
Glucose, Bld: 88 mg/dL (ref 70–99)
Potassium: 5.1 mEq/L (ref 3.5–5.1)
Sodium: 138 mEq/L (ref 135–145)

## 2014-09-03 LAB — HEPATIC FUNCTION PANEL
ALBUMIN: 4.6 g/dL (ref 3.5–5.2)
ALK PHOS: 65 U/L (ref 39–117)
ALT: 13 U/L (ref 0–53)
AST: 23 U/L (ref 0–37)
Bilirubin, Direct: 0.3 mg/dL (ref 0.0–0.3)
Total Bilirubin: 1.6 mg/dL — ABNORMAL HIGH (ref 0.2–1.2)
Total Protein: 7.3 g/dL (ref 6.0–8.3)

## 2014-09-03 LAB — PSA: PSA: 5.27 ng/mL — AB (ref 0.10–4.00)

## 2014-09-03 LAB — TSH: TSH: 5.4 u[IU]/mL — AB (ref 0.35–4.50)

## 2014-09-09 ENCOUNTER — Ambulatory Visit: Payer: Medicare Other

## 2014-09-09 ENCOUNTER — Encounter: Payer: Self-pay | Admitting: Pulmonary Disease

## 2014-09-09 ENCOUNTER — Ambulatory Visit (INDEPENDENT_AMBULATORY_CARE_PROVIDER_SITE_OTHER): Payer: BLUE CROSS/BLUE SHIELD | Admitting: Pulmonary Disease

## 2014-09-09 VITALS — BP 138/80 | HR 50 | Temp 97.8°F | Ht 71.5 in | Wt 157.2 lb

## 2014-09-09 DIAGNOSIS — Z95810 Presence of automatic (implantable) cardiac defibrillator: Secondary | ICD-10-CM

## 2014-09-09 DIAGNOSIS — I2510 Atherosclerotic heart disease of native coronary artery without angina pectoris: Secondary | ICD-10-CM

## 2014-09-09 DIAGNOSIS — E785 Hyperlipidemia, unspecified: Secondary | ICD-10-CM | POA: Diagnosis not present

## 2014-09-09 DIAGNOSIS — M159 Polyosteoarthritis, unspecified: Secondary | ICD-10-CM

## 2014-09-09 DIAGNOSIS — K5909 Other constipation: Secondary | ICD-10-CM | POA: Insufficient documentation

## 2014-09-09 DIAGNOSIS — E538 Deficiency of other specified B group vitamins: Secondary | ICD-10-CM

## 2014-09-09 DIAGNOSIS — K5900 Constipation, unspecified: Secondary | ICD-10-CM | POA: Insufficient documentation

## 2014-09-09 DIAGNOSIS — M150 Primary generalized (osteo)arthritis: Secondary | ICD-10-CM

## 2014-09-09 DIAGNOSIS — I447 Left bundle-branch block, unspecified: Secondary | ICD-10-CM

## 2014-09-09 DIAGNOSIS — I10 Essential (primary) hypertension: Secondary | ICD-10-CM

## 2014-09-09 DIAGNOSIS — R972 Elevated prostate specific antigen [PSA]: Secondary | ICD-10-CM | POA: Insufficient documentation

## 2014-09-09 DIAGNOSIS — I350 Nonrheumatic aortic (valve) stenosis: Secondary | ICD-10-CM | POA: Diagnosis not present

## 2014-09-09 DIAGNOSIS — I429 Cardiomyopathy, unspecified: Secondary | ICD-10-CM

## 2014-09-09 DIAGNOSIS — I340 Nonrheumatic mitral (valve) insufficiency: Secondary | ICD-10-CM | POA: Diagnosis not present

## 2014-09-09 DIAGNOSIS — Z23 Encounter for immunization: Secondary | ICD-10-CM | POA: Diagnosis not present

## 2014-09-09 DIAGNOSIS — I5022 Chronic systolic (congestive) heart failure: Secondary | ICD-10-CM

## 2014-09-09 MED ORDER — RAMIPRIL 10 MG PO CAPS: ORAL_CAPSULE | ORAL | Status: DC

## 2014-09-09 MED ORDER — CYANOCOBALAMIN 1000 MCG/ML IJ SOLN
1000.0000 ug | Freq: Once | INTRAMUSCULAR | Status: AC
  Administered 2014-09-09: 1000 ug via INTRAMUSCULAR

## 2014-09-09 MED ORDER — METOPROLOL SUCCINATE ER 100 MG PO TB24: ORAL_TABLET | ORAL | Status: DC

## 2014-09-09 MED ORDER — GEMFIBROZIL 600 MG PO TABS: ORAL_TABLET | ORAL | Status: DC

## 2014-09-09 MED ORDER — SIMVASTATIN 40 MG PO TABS: ORAL_TABLET | ORAL | Status: DC

## 2014-09-09 MED ORDER — CLOPIDOGREL BISULFATE 75 MG PO TABS: ORAL_TABLET | ORAL | Status: DC

## 2014-09-09 NOTE — Patient Instructions (Signed)
Today we updated your med list in our EPIC system...    Continue your current medications the same...  We reviewed your recent blood work 7 gave you a copy for your records...  We will arrange for an appt w/ a Urologist regarding the elevated PSA (to r/o prostate cancer)...  We gave you the PREVNAR-13 pneumonia shot today...    That is the last pneumonia vaccine you will require...  Call for any questions...  Let's plan a follow up visit in 52mo, sooner if needed for problems...    Your tyroid blood test indicated a sluggish thyroid & we will recheck this lab on return.Marland KitchenMarland Kitchen

## 2014-09-09 NOTE — Progress Notes (Signed)
Subjective:    Patient ID: [name], male    DOB: November 08, 1931, 81 y.o.   MRN: [id number]  HPI 81 y/o WM here for a follow up visit & review of mult medical problems... 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 ~  SEE PREV EPIC NOTES FOR OLDER DATA >>   ~  March 11, 2013:  68mo ROV & [name] continues to do well- no new complaints or concerns;  We reviewed the following medical problems during today's office visit >>     HBP> on MetopER100AM, Altace10PM;  BP= 120/70 & he denies CP, palpit, dizzy, SOB, edema...    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    Hx Syncope, AV block, Implanted Defib> followed by DrTaylor (seen 2/14 w/ his StJude biventricICD working normally) w/ pacer checks every 2-48mo he says; generator changed 6/11; he denies CP/angina, dizzy/ syncope, etc...    Chol> on Simva40, Lopid600, FishOil; FLP 7/14 shows TChol 171, TG 100, HDL 47, LDL 104    GI- GERD> he had LapChole 2006; he notes some constip & uses Benefiber which helps; cannot find reference to prev colonoscopy in Epic or Centricity but he says neg colon by DrPatterson in the past...    DJD> he just uses Tylenol as needed...    VitB12 defic> on B12 shots monthly... We reviewed prob list, meds, xrays and labs> see below for updates >>   ~  September 09, 2013:  48mo ROV & [name] continues to do well- no new complaints or concerns;  We reviewed the following medical problems during today's office visit >>     HBP> on MetopER100AM, Altace10PM;  BP= 136/64 & he denies CP, palpit, dizzy, SOB, edema...    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    Hx Syncope, AV block, Implanted Defib> followed by DrTaylor (seen 2/14 w/ his StJude biventricICD working normally) w/ pacer checks every 2-79mo he says; generator changed 6/11; he denies CP/angina, dizzy/ syncope, etc...    Chol> on Simva40, Lopid600, FishOil; FLP 1/15 shows TChol 171, TG 100, HDL 47, LDL 104    GI- GERD, constip> he had LapChole 2006; he notes constip & had eval by DrPatterson; cannot find reference to prev colonoscopy in Epic or Centricity but he says neg colon 2005 by DrPatterson; Rectal exam showed anal stenosis- dilated via anoscopy; he was treated w/ Linzess & Chronulac & AnusolHC and improved...     DJD> he just uses Tylenol as needed...    VitB12 defic> on B12 shots monthly... We reviewed prob list, meds, xrays and labs> see below for updates >>   LABS 1/15:  FLP- ok on Simva40+Lopid600;  Chems- wnl;  CBC- ok w/ Hg=12.6;  TSH=3.78;  PSA=3.47...  ~  March 09, 2014:  23mo ROV & [name] had a recent GI problem w/ lower abd pain, nausea, diarrhea=>then constipation, no bl seen; he saw  TP 02/18/14 w/ labs done & all norm x WBC-16.9 w/ 57%eos; placed on Colace and Miralax and referred to GI-DrPerry; seen 03/02/14 (note reviewed) felt to have chr constipation, hx anal stenosis, and asked to continue Colace & Miralax regularly (no comment made about the eosinophilia)... He is now much improved- feeling better, energy good, still working regularly; he notes that appetite is good, swallowing is OK & he deniesCP, heartburn, reflux, etc... We did f/u CBC showing Hg=11.6 and WBC improved to 6.9 w/ 17%eos (in light of his clinical improvement, we will continue to follow)...    HBP> on MetopER100AM, Altace10PM;  BP= 100/60 & he denies CP, palpit, dizzy, SOB, edema; states feeling well & energy is good...    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    Hx Syncope, AV block, Implanted Defib> followed by DrTaylor (seen 2/15- note reviewed) w/ his StJude biventricICD working normally) w/ pacer checks every 2-37mo he says; generator changed 6/11; he denies CP/angina, dizzy/ syncope, etc...    Chol> on Simva40, Lopid600, FishOil; FLP 6/15 shows TChol 168, TG 117, HDL 41, LDL 104... No changes made, we stressed diet...    GI- GERD, constip> he had LapChole 2006; he notes constip & had eval by DrPatterson; cannot find reference to prev colonoscopy in Epic or Centricity but he says neg colon 2005 by DrPatterson; Rectal exam showed anal stenosis- dilated via anoscopy; he was treated w/ Linzess & Chronulac & AnusolHC and improved...     DJD> he just uses Tylenol as needed...    VitB12 defic> on B12 shots monthly... We reviewed prob list, meds, xrays and labs> see below for updates >> Given B12 shot today...   LABS 6/15 & 7/15 showed> FLP- fair w/ LDL=104;  Chems- wnl;  CBC- ok x WBC=16.9 w/ 57%eos; f/u CBC 8/15 showed Hg=11.6, WBC=6.9 w/ 17%eos...  CXR 8/15 showed norm heart size, Pacer/ICD stable, stent noted, lungs clear, NAD...    ~  September 09, 2014:  19mo ROV & [name] was injured at work (fell pulling yarn out of a machine) & sustained fx rib, went to ER 08/14/14, note reviewed, XRay w/ left anterolat 6th rib fx, treated w/ rest/ heat/ Tramadol/ Tylenol/ etc... Despite being 81 y/o he says he's going to keep on going!     He remains on MetopER100 & Altace10; BP= 138/80 & he denies any current CP, palpit, SOB, edema...    Hx CAD, cardiomyop, chr sys CHF, AS & MR, AVB & defib> followed by Benay Spice & DrTaylor on ASA/ Plavix & he states stable...    On Simva40,  Lopid600; FLP 1/16 showed TChol 148, TG 111, HDL 45, LDL 81    Labs show elev PSA= 5.27 & we will refer to Urology... We reviewed prob list, meds, xrays and labs> see below for updates >>    LABS 1/16:  FLP looks good on Simva40/Lopid600; Chems- wnl;  CBC- wnl;  TSH=5.40;  PSA=5.27...           Problem List:  HYPERTENSION (ICD-401.9) - controlled on TOPROL XL 100mg /d & ALTACE 10mg /d... takes meds regularly and tolerates well...  ~  7/12: BP= 120/68 and OK at home per pt; he gets rare  HA which he believes is related to his BP; denies fatigue, visual changes, CP, palipit, dizziness, syncope, dyspnea, edema, etc... ~  CXR 9/12 showed dual lead pacer/ AICD, norm heart size, clear lungs, NAD... ~  2/13:  BP= 124/68 & he remains asymptomatic... ~  8/13:  BP= 122/58 & he denies CP, palpit, SOB, edema, etc... ~  2/14: on MetopER100, Altace10;  BP= 148/60 & he denies CP, palpit, dizzy, SOB, edema. ~  8/14: on MetopER100 in AM, Altace10 in PM;  BP= 120/70 & he denies CP, palpit, dizzy, SOB, edema. ~  2/15: on MetopER100 & Altace10; BP= 136/64 & he remains asymptomatic... ~  8/15: on MetopER100AM, Altace10PM;  BP= 100/60 & he denies CP, palpit, dizzy, SOB, edema; states feeling well & energy is good ~  CXR 8/15 showed norm heart size, Pacer/ICD stable, stent noted, lungs clear, NAD. ~  2/16:  He remains on MetopER100 & Altace10; BP= 138/80 & he denies any current CP, palpit, SOB, edema.  CAD (ICD-414.00) - on ASA 81mg /d & PLAVIX 75mg /d> followed by Benay Spice... ISCHEMIC CARDIOMYOPATHY (ICD-414.8) - he's had prev MI w/ mult caths/ interventions. CHRONIC SYSTOLIC HEART FAILURE (OYD-741.28) AORTIC STENOSIS (ICD-424.1) > Mild by 2DEcho 10/11 MITRAL REGURGITATION (ICD-396.3) ~  last cath 5/08 showed norm Lmain, patent stent in prox LAD, 80-90% lesion in mid-LAD w/ PCI & stent placed, & 50% ostial stenosis of 1st diagonal branch of LAD; 40%mid & 70%distal CIRC; 50%prox 40%mid 40%distal RCA; Global HK  w/EF=30%. ~  NuclearStressTest 4/09 showed infer scar, no ischemia, EF=40%. ~  2DEcho 10/11 showed mild LVH, decr LVF w/ EF= 35-40% & mild HK, gr1DD, mild AS & MR, mild LAdil. ~  EKG 10/12 showed pacer rhythm... 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  Hx of SYNCOPE (ICD-780.2) AV BLOCK, COMPLETE (ICD-426.0) AUTOMATIC IMPLANTABLE CARDIAC DEFIBRILLATOR SITU (ICD-V45.02) ~  Pacer/ ICD per DrTaylor w/ several adjustments in 2008... ~  5/11:  f/u DrTaylor w/ decision for elective generator change> done 6/11 & tol well. ~  He continues to f/u w/ DrTaylor every 6 months... ~  2/13: f/u DrTaylor doing satis, device working normally, no changes made & f/u 47yr... ~  2/14: Hx Syncope, AV block, Implanted Defib> followed by DrTaylor w/ pacer checks every 2-107mo he says; generator changed 6/11; he denies CP/angina, dizzy/ syncope, etc. ~  2/15: he had f/u DrTaylor> stable & doing satis...  HYPERCHOLESTEROLEMIA (ICD-272.0) - takes SIMVASTATIN 40mg /d, GEMFIBRIZOL 600mg /d, & FISH OIL Bid... ~  [place] 08/14/07 on Simva40 showed TChol 163, TG 94, HDL 34, LDL 111... rec incr Simvastatin to 80mg /d. ~  FLP 1/10 showed TChol 125, TG 67, HDL 32, LDL 80... rec- continue same. ~  FLP 1/11 on Simva80+Gemfib600 showed TChol 137, TG 99, HDL 39, LDL 78 ~  FLP 1/12 on Simva40+Gemfib600 showed TChol 157, TG 139, HDL 38, LDL 92 ~  FLP 1/13 on Simva40+Gemfib600 showed TChol 149, TG 50, HDL 39, LDL 100 ~  FLP 1/14 on Simva40+Gemfib600 showed TChol  152, TG 74, HDL 38, LDL 99  ~  FLP 1/15 on  Simva40+Gemfib600 showed TChol 159, TG 64, HDL 43, LDL 103 ~  FLP 6/15 on Simva40+Gemfib600 showed TChol 168, TG 117, HDL 41, LDL 104  ~  FLP 1/16 on Simva40+Gemfib600 showed TChol 148, TG 111, HDL 45, LDL 81  BORDERLINE TSH >> ~  Labs 1/15 showed TSH= 3.78 ~  Labs 1/16 showed an elev TSH for the 1st time w/ TSH= 5.40, he is clinically euthyroid.  GERD (ICD-530.81) - he had ERCP in 8/06 w/ norm ducts, +gallstones, s/p lap chole 2006... he uses OTC meds Prn... ~  he notes that he had a colonoscopy in the past from DrPatterson, & he reports that it was neg. ~  7/15> GI eval from DrPerry, he didn't cooment on the eosinophilia...  ELEVATED PSA >> ~  Labs 1/16 showed an elev PSA for the 1st time w/ PSA= 5.27  DEGENERATIVE JOINT DISEASE (ICD-715.90) - he tried Mobic but it bothered his stomach, therefore uses Tylenol Prn.  VITAMIN B12 DEFICIENCY (ICD-266.2) - old chart not available- ? details of this Dx... on B12 shots xyrs. ~  labs 1/09 showed Hg=12.9 ~  labs 1/10 showed Hg= 12.7 ~  labs 1/11 showed Hg= 12.6 ~  labs 1/12 showed Hg= 13.2 ~  Labs 1/13 showed Hg= 12.6 ~  Labs 1/14 showed Hg= 12.7 ~  Labs 1/15 showed Hg= 12.6, WBC= 5.7 w/ 10%eos ~  Labs 7/15 showed Hg= 11.7, WBC= 16.9 w/ 57%eos... ~  Labs 8/15 showed Hg= 11.6, WBC= 6.9, w/ 17%eos... ~  Labs 1/16 showed Hg= 12.7, WBC= 4.4 w/ 9% eos  Health Maintenance: ~  GI:  Followed by DrPatterson ~  GU:  PSA checked yearly & remains wnl... ~  Immunizations:  he gets the yearly Flu vaccine;  [place] says;  given Pneumovax 1/11 age 23.   Past Surgical History  Procedure Laterality Date  . Icd implanted  2005  . Laparoscopic cholecystectomy  04/2005    Dr. Lucia Gaskins  . Cardiac catheterization  2008    Outpatient Encounter Prescriptions as of 09/09/2014  Medication Sig  . aspirin 81 MG tablet Take 81 mg by mouth daily.    . clopidogrel (PLAVIX) 75 MG tablet TAKE 1 TABLET DAILY  . cyanocobalamin  (,VITAMIN B-12,) 1000 MCG/ML injection Inject 1,000 mcg into the muscle every 30 (thirty) days.    Marland Kitchen docusate sodium (COLACE) 100 MG capsule Take 100 mg by mouth 3 (three) times daily.  . fish oil-omega-3 fatty acids 1000 MG capsule Take 2 g by mouth daily.    Marland Kitchen gemfibrozil (LOPID) 600 MG tablet TAKE 1 TABLET DAILY  . metoprolol succinate (TOPROL-XL) 100 MG 24 hr tablet TAKE 1 TABLET DAILY  . Multiple Vitamins-Minerals (MENS MULTIVITAMIN PLUS PO) Take 1 tablet by mouth daily.    . polyethylene glycol powder (GLYCOLAX/MIRALAX) powder 2 scoops daily  . ramipril (ALTACE) 10 MG capsule TAKE 1 CAPSULE DAILY  . simvastatin (ZOCOR) 40 MG tablet TAKE 1 TABLET EVERY EVENING  . [DISCONTINUED] traMADol (ULTRAM) 50 MG tablet Take 1 tablet (50 mg total) by mouth every 6 (six) hours as needed. (Patient not taking: Reported on 09/09/2014)    No Known Allergies   Current Medications, Allergies, Past Medical History, Past Surgical History, Family History, and Social History were reviewed in [facility] record.    Review of Systems  See HPI - all other systems neg except as noted...  The patient complains of dyspnea on exertion.  The patient denies anorexia, fever, weight loss, weight gain, vision loss, decreased hearing, hoarseness, chest pain, syncope, peripheral edema, prolonged cough, headaches, hemoptysis, abdominal pain, melena, hematochezia, severe indigestion/heartburn, hematuria, incontinence, muscle weakness, suspicious skin lesions, transient blindness, difficulty walking, depression, unusual weight change, abnormal bleeding, enlarged lymph nodes, and angioedema.     Objective:   Physical Exam     WD, WN, 81 y/o WM in NAD... wt=170# ht=6'1" BMI=22... GENERAL:  Alert & oriented; pleasant & cooperative... HEENT:  [place]/AT, EOM-wnl, PERRLA, EACs-clear, TMs-wnl, NOSE-clear, THROAT-clear & wnl. NECK:  Supple w/ fairROM; no JVD; normal carotid impulses w/o bruits; no  thyromegaly or nodules palpated; no lymphadenopathy. CHEST:  Clear to P & A; without wheezes/ rales/ or rhonchi heard... HEART:  Pacer in L upper chest area~ nontender, RR, gr 1/6 SEM without rubs or gallops detected... ABDOMEN:  Soft & nontender; normal bowel sounds; no organomegaly or masses palpated... EXT: without deformities, mild arthritic changes; no varicose veins/ venous insuffic/ or edema. NEURO:  CN's intact; no focal neuro deficits apprec... DERM:  No lesions noted; no rash etc...  RADIOLOGY DATA:  Reviewed in the EPIC EMR & discussed w/ the patient...   LABORATORY DATA:  Reviewed in the EPIC EMR & discussed w/ the patient...     Assessment & Plan:    HBP>  Controlled on Metoprolol & Ramipril, continue same...  CAD, Cardiomyopathy, CHF, ValvHeartDis w/ AS & MR>  Followed by DrKatz/ [name] & stable on ASA/ Plavix, still working 3d per week etc...  Hx syncope, AVBlock, Pacer/AICD>  Followed by DrTaylor & stable, continue same...  LIPIDS>  FLP looks OK on Simva40, Gemfib 600mg /d, & Fish Oil...  GI symptoms 7/15>  Resolved w/ Colace & Miralax... He feels he is back to baseline...  GU> Elev PSA noted 1/16 & referred to Urology...  DJD>  He uses Tylenol as needed; still working regularly...  B-12 defic>  He remains on B12 shots monthly...   Patient's Medications  New Prescriptions   No medications on file  Previous Medications   ASPIRIN 81 MG TABLET    Take 81 mg by mouth daily.     CYANOCOBALAMIN (,VITAMIN B-12,) 1000 MCG/ML INJECTION    Inject 1,000 mcg into the muscle every 30 (thirty) days.     DOCUSATE SODIUM (COLACE) 100 MG CAPSULE    Take 100 mg by mouth 3 (three) times daily.   FISH OIL-OMEGA-3 FATTY ACIDS 1000 MG CAPSULE    Take 2 g by mouth daily.     MULTIPLE VITAMINS-MINERALS (MENS MULTIVITAMIN PLUS PO)    Take 1 tablet by mouth daily.     POLYETHYLENE GLYCOL POWDER (GLYCOLAX/MIRALAX) POWDER    2 scoops daily  Modified Medications   Modified Medication  Previous Medication   CLOPIDOGREL (PLAVIX) 75 MG TABLET clopidogrel (PLAVIX) 75 MG tablet      TAKE 1 TABLET DAILY    TAKE 1 TABLET DAILY   GEMFIBROZIL (LOPID) 600 MG TABLET gemfibrozil (LOPID) 600 MG tablet      TAKE 1 TABLET DAILY    TAKE 1 TABLET DAILY   METOPROLOL SUCCINATE (TOPROL-XL) 100 MG 24 HR TABLET metoprolol succinate (TOPROL-XL) 100 MG 24 hr tablet      TAKE 1 TABLET DAILY    TAKE 1 TABLET DAILY   RAMIPRIL (ALTACE) 10 MG CAPSULE ramipril (ALTACE) 10 MG capsule      TAKE 1  CAPSULE DAILY    TAKE 1 CAPSULE DAILY   SIMVASTATIN (ZOCOR) 40 MG TABLET simvastatin (ZOCOR) 40 MG tablet      TAKE 1 TABLET EVERY EVENING    TAKE 1 TABLET EVERY EVENING  Discontinued Medications   TRAMADOL (ULTRAM) 50 MG TABLET    Take 1 tablet (50 mg total) by mouth every 6 (six) hours as needed.

## 2014-09-21 ENCOUNTER — Ambulatory Visit (INDEPENDENT_AMBULATORY_CARE_PROVIDER_SITE_OTHER): Payer: BLUE CROSS/BLUE SHIELD | Admitting: Internal Medicine

## 2014-09-21 ENCOUNTER — Encounter: Payer: Self-pay | Admitting: Internal Medicine

## 2014-09-21 VITALS — BP 144/76 | HR 51 | Ht 71.5 in | Wt 162.2 lb

## 2014-09-21 DIAGNOSIS — I350 Nonrheumatic aortic (valve) stenosis: Secondary | ICD-10-CM

## 2014-09-21 DIAGNOSIS — I10 Essential (primary) hypertension: Secondary | ICD-10-CM

## 2014-09-21 DIAGNOSIS — I429 Cardiomyopathy, unspecified: Secondary | ICD-10-CM

## 2014-09-21 DIAGNOSIS — I5022 Chronic systolic (congestive) heart failure: Secondary | ICD-10-CM | POA: Diagnosis not present

## 2014-09-21 DIAGNOSIS — Z95810 Presence of automatic (implantable) cardiac defibrillator: Secondary | ICD-10-CM

## 2014-09-21 DIAGNOSIS — I2510 Atherosclerotic heart disease of native coronary artery without angina pectoris: Secondary | ICD-10-CM

## 2014-09-21 LAB — MDC_IDC_ENUM_SESS_TYPE_INCLINIC
Battery Remaining Longevity: 42 mo
Brady Statistic RA Percent Paced: 36 %
Date Time Interrogation Session: 20160216173747
HighPow Impedance: 40.3393
Implantable Pulse Generator Serial Number: 610170
Lead Channel Impedance Value: 300 Ohm
Lead Channel Impedance Value: 425 Ohm
Lead Channel Pacing Threshold Amplitude: 0.75 V
Lead Channel Pacing Threshold Pulse Width: 0.5 ms
Lead Channel Sensing Intrinsic Amplitude: 10.2 mV
Lead Channel Sensing Intrinsic Amplitude: 2.9 mV
MDC IDC MSMT LEADCHNL RA PACING THRESHOLD AMPLITUDE: 0.75 V
MDC IDC MSMT LEADCHNL RA PACING THRESHOLD AMPLITUDE: 0.75 V
MDC IDC MSMT LEADCHNL RA PACING THRESHOLD PULSEWIDTH: 0.5 ms
MDC IDC MSMT LEADCHNL RV PACING THRESHOLD PULSEWIDTH: 0.5 ms
MDC IDC SET LEADCHNL RA PACING AMPLITUDE: 2 V
MDC IDC SET LEADCHNL RV PACING AMPLITUDE: 2 V
MDC IDC SET LEADCHNL RV PACING PULSEWIDTH: 0.5 ms
MDC IDC SET LEADCHNL RV SENSING SENSITIVITY: 0.5 mV
MDC IDC STAT BRADY RV PERCENT PACED: 99.11 %
Zone Setting Detection Interval: 300 ms
Zone Setting Detection Interval: 375 ms

## 2014-09-21 NOTE — Assessment & Plan Note (Signed)
His aortic stenosis is moderate. He will continue a period of watchful waiting.

## 2014-09-21 NOTE — Assessment & Plan Note (Signed)
His symptoms remain class I despite complete heart block, and severe left ventricular dysfunction. No indication to place a left ventricular lead at this point. He will continue his current medical therapy. He will maintain a low-sodium diet.

## 2014-09-21 NOTE — Progress Notes (Signed)
HPI [name] returns today for followup. He is a very pleasant 81 year old man with an ischemic cardiomyopathy, complete heart block, dyslipidemia, chronic systolic heart failure, status post biventricular ICD implantation, with no attempt to place left ventricular lead because of his lack of class II or 3 symptoms. In the interim, he has done well. He continues to work full time. He denies chest pain, shortness of breath, peripheral edema, syncope, and has had no ICD shocks.  No Known Allergies   Current Outpatient Prescriptions  Medication Sig Dispense Refill  . aspirin 81 MG tablet Take 81 mg by mouth daily.      . clopidogrel (PLAVIX) 75 MG tablet TAKE 1 TABLET DAILY (Patient taking differently: TAKE 1 TABLET BY MOUTH DAILY) 90 tablet 3  . cyanocobalamin (,VITAMIN B-12,) 1000 MCG/ML injection Inject 1,000 mcg into the muscle every 30 (thirty) days.      . fish oil-omega-3 fatty acids 1000 MG capsule Take 2 g by mouth daily.      Marland Kitchen gemfibrozil (LOPID) 600 MG tablet TAKE 1 TABLET DAILY (Patient taking differently: TAKE 1 TABLET BY MOUTH DAILY) 90 tablet 3  . metoprolol succinate (TOPROL-XL) 100 MG 24 hr tablet TAKE 1 TABLET DAILY (Patient taking differently: TAKE 1 TABLET BY MOUTH DAILY) 90 tablet 3  . Multiple Vitamins-Minerals (MENS MULTIVITAMIN PLUS PO) Take 1 tablet by mouth daily.      . Polyethylene Glycol POWD Take 1 scoop by mouth daily.     . ramipril (ALTACE) 10 MG capsule TAKE 1 CAPSULE DAILY (Patient taking differently: TAKE 1 CAPSULE BY MOUTH DAILY) 90 capsule 3  . simvastatin (ZOCOR) 40 MG tablet TAKE 1 TABLET EVERY EVENING (Patient taking differently: TAKE 1 TABLET BY MOUTH EVERY EVENING) 90 tablet 3   No current facility-administered medications for this visit.     Past Medical History  Diagnosis Date  . Hypertension   . CAD (coronary artery disease)     DES to LAD 2005 /  DES to LAD 2008  . LBBB (left bundle branch block)   . Cardiomyopathy     Ischemic, EF 30%,  2005  . Mitral regurgitation     Mild, echo, October, 2011  . Aortic stenosis     Mild, echo, October, 2011  . AV block, complete     Permanent pacemaker  . ICD (implantable cardiac defibrillator), single, in situ     for syncope and arrhythmia 2005 / generator change June, 2011  . Dyslipidemia   . GERD (gastroesophageal reflux disease)   . DJD (degenerative joint disease)   . Anemia, unspecified   . Vitamin B12 deficiency   . Ejection fraction < 50%     EF 30%, echo, 2005  /  EF 30%, catheterization, 2008  . Syncope     2005, ICD placed  . Systolic CHF, chronic   . Rectal fissure   . Hyperlipidemia     ROS:   All systems reviewed and negative except as noted in the HPI.   Past Surgical History  Procedure Laterality Date  . Icd implanted  2005  . Laparoscopic cholecystectomy  04/2005    Dr. Lucia Gaskins  . Cardiac catheterization  2008     Family History  Problem Relation Age of Onset  . Stroke Brother   . Lung cancer Brother   . Stroke Mother   . Cancer Mother 23  . Stroke Father 48     History   Social History  . Marital Status: Widowed  Spouse Name: N/A  . Number of Children: 2  . Years of Education: N/A   Occupational History  . repairs looms at [employer]    Social History Main Topics  . Smoking status: Former Smoker -- 1.00 packs/day for 2 years    Types: Cigarettes    Quit date: 08/06/1952  . Smokeless tobacco: Never Used  . Alcohol Use: No  . Drug Use: No  . Sexual Activity: Not on file   Other Topics Concern  . Not on file   Social History Narrative     BP 144/76 mmHg  Pulse 51  Ht 5' 11.5" (1.816 m)  Wt 162 lb 3.2 oz (73.573 kg)  BMI 22.31 kg/m2  Physical Exam:  Well appearing 81 year old man, NAD HEENT: Unremarkable Neck:  No JVD, no thyromegally Lungs:  Clear with no wheezes, rales, or rhonchi. HEART:  Regular rate rhythm, grade 2/6 systolic murmurs, no rubs, no clicks Abd:  soft, positive bowel sounds, no organomegally, no  rebound, no guarding Ext:  2 plus pulses, no edema, no cyanosis, no clubbing Skin:  No rashes no nodules Neuro:  CN II through XII intact, motor grossly intact  DEVICE  Normal device function.  See PaceArt for details.   Assess/Plan:

## 2014-09-21 NOTE — Patient Instructions (Signed)
Remote monitoring is used to monitor your ICD from home. This monitoring reduces the number of office visits required to check your device to one time per year. It allows [place] to keep an eye on the functioning of your device to ensure it is working properly. You are scheduled for a device check from home on 12-21-2014. You may send your transmission at any time that day. If you have a wireless device, the transmission will be sent automatically. After your physician reviews your transmission, you will receive a postcard with your next transmission date.  Your physician recommends that you schedule a follow-up appointment in: 12 months with Dr.Taylor

## 2014-09-21 NOTE — Assessment & Plan Note (Signed)
His blood pressure is slightly elevated today. I encouraged the patient to reduce his sodium intake. He will continue his current medication for now.

## 2014-09-21 NOTE — Assessment & Plan Note (Signed)
His St. Jude dual-chamber ICD is working normally. His LV port is plugged. We'll plan to recheck in several months.

## 2014-10-11 ENCOUNTER — Ambulatory Visit (INDEPENDENT_AMBULATORY_CARE_PROVIDER_SITE_OTHER): Payer: BLUE CROSS/BLUE SHIELD

## 2014-10-11 DIAGNOSIS — E538 Deficiency of other specified B group vitamins: Secondary | ICD-10-CM | POA: Diagnosis not present

## 2014-10-11 DIAGNOSIS — R972 Elevated prostate specific antigen [PSA]: Secondary | ICD-10-CM | POA: Diagnosis not present

## 2014-10-13 MED ORDER — CYANOCOBALAMIN 1000 MCG/ML IJ SOLN
1000.0000 ug | Freq: Once | INTRAMUSCULAR | Status: AC
  Administered 2014-10-11: 1000 ug via INTRAMUSCULAR

## 2014-11-11 ENCOUNTER — Ambulatory Visit (INDEPENDENT_AMBULATORY_CARE_PROVIDER_SITE_OTHER): Payer: BLUE CROSS/BLUE SHIELD

## 2014-11-11 DIAGNOSIS — E538 Deficiency of other specified B group vitamins: Secondary | ICD-10-CM

## 2014-11-12 DIAGNOSIS — E538 Deficiency of other specified B group vitamins: Secondary | ICD-10-CM | POA: Diagnosis not present

## 2014-11-12 MED ORDER — CYANOCOBALAMIN 1000 MCG/ML IJ SOLN
1000.0000 ug | Freq: Once | INTRAMUSCULAR | Status: AC
  Administered 2014-11-11: 1000 ug via INTRAMUSCULAR

## 2014-12-13 ENCOUNTER — Ambulatory Visit (INDEPENDENT_AMBULATORY_CARE_PROVIDER_SITE_OTHER): Payer: BLUE CROSS/BLUE SHIELD

## 2014-12-13 DIAGNOSIS — E538 Deficiency of other specified B group vitamins: Secondary | ICD-10-CM | POA: Diagnosis not present

## 2014-12-14 ENCOUNTER — Encounter: Payer: Self-pay | Admitting: Cardiology

## 2014-12-15 MED ORDER — CYANOCOBALAMIN 1000 MCG/ML IJ SOLN
1000.0000 ug | Freq: Once | INTRAMUSCULAR | Status: AC
  Administered 2014-12-13: 1000 ug via INTRAMUSCULAR

## 2014-12-21 ENCOUNTER — Ambulatory Visit (INDEPENDENT_AMBULATORY_CARE_PROVIDER_SITE_OTHER): Payer: BLUE CROSS/BLUE SHIELD | Admitting: *Deleted

## 2014-12-21 ENCOUNTER — Telehealth: Payer: Self-pay | Admitting: Cardiology

## 2014-12-21 ENCOUNTER — Encounter: Payer: Self-pay | Admitting: Internal Medicine

## 2014-12-21 DIAGNOSIS — I429 Cardiomyopathy, unspecified: Secondary | ICD-10-CM

## 2014-12-21 DIAGNOSIS — I5022 Chronic systolic (congestive) heart failure: Secondary | ICD-10-CM | POA: Diagnosis not present

## 2014-12-21 NOTE — Telephone Encounter (Signed)
LMOVM reminding pt to send remote transmission.   

## 2014-12-22 NOTE — Progress Notes (Signed)
Remote ICD transmission.   

## 2014-12-29 LAB — CUP PACEART REMOTE DEVICE CHECK
Battery Remaining Percentage: 39 %
Brady Statistic RA Percent Paced: 37 %
Brady Statistic RV Percent Paced: 99 %
HIGH POWER IMPEDANCE MEASURED VALUE: 40 Ohm
Lead Channel Impedance Value: 310 Ohm
Lead Channel Impedance Value: 400 Ohm
Lead Channel Pacing Threshold Amplitude: 0.75 V
Lead Channel Pacing Threshold Pulse Width: 0.5 ms
Lead Channel Setting Pacing Amplitude: 2 V
MDC IDC MSMT LEADCHNL RA SENSING INTR AMPL: 2.5 mV
MDC IDC SESS DTM: 20160525193413
MDC IDC SET LEADCHNL RV PACING AMPLITUDE: 2 V
MDC IDC SET LEADCHNL RV PACING PULSEWIDTH: 0.5 ms
MDC IDC SET LEADCHNL RV SENSING SENSITIVITY: 0.5 mV
MDC IDC SET ZONE DETECTION INTERVAL: 300 ms
Pulse Gen Serial Number: 610170
Zone Setting Detection Interval: 375 ms

## 2015-01-05 ENCOUNTER — Encounter: Payer: Self-pay | Admitting: Cardiology

## 2015-01-10 ENCOUNTER — Ambulatory Visit (INDEPENDENT_AMBULATORY_CARE_PROVIDER_SITE_OTHER): Payer: BLUE CROSS/BLUE SHIELD

## 2015-01-10 DIAGNOSIS — E538 Deficiency of other specified B group vitamins: Secondary | ICD-10-CM

## 2015-01-12 DIAGNOSIS — E538 Deficiency of other specified B group vitamins: Secondary | ICD-10-CM | POA: Diagnosis not present

## 2015-01-12 MED ORDER — CYANOCOBALAMIN 1000 MCG/ML IJ SOLN
1000.0000 ug | Freq: Once | INTRAMUSCULAR | Status: AC
  Administered 2015-01-10: 1000 ug via INTRAMUSCULAR

## 2015-01-17 ENCOUNTER — Ambulatory Visit: Payer: Self-pay | Admitting: Cardiology

## 2015-01-17 DIAGNOSIS — M79672 Pain in left foot: Secondary | ICD-10-CM | POA: Diagnosis not present

## 2015-01-17 DIAGNOSIS — M7732 Calcaneal spur, left foot: Secondary | ICD-10-CM | POA: Diagnosis not present

## 2015-01-17 DIAGNOSIS — M722 Plantar fascial fibromatosis: Secondary | ICD-10-CM | POA: Diagnosis not present

## 2015-02-09 ENCOUNTER — Ambulatory Visit (INDEPENDENT_AMBULATORY_CARE_PROVIDER_SITE_OTHER): Payer: BLUE CROSS/BLUE SHIELD

## 2015-02-09 DIAGNOSIS — E538 Deficiency of other specified B group vitamins: Secondary | ICD-10-CM | POA: Diagnosis not present

## 2015-02-10 MED ORDER — CYANOCOBALAMIN 1000 MCG/ML IJ SOLN: 1000.0000 ug | Freq: Once | INTRAMUSCULAR | Status: DC

## 2015-02-11 ENCOUNTER — Ambulatory Visit (INDEPENDENT_AMBULATORY_CARE_PROVIDER_SITE_OTHER): Payer: BLUE CROSS/BLUE SHIELD | Admitting: Cardiology

## 2015-02-11 ENCOUNTER — Encounter: Payer: Self-pay | Admitting: Cardiology

## 2015-02-11 VITALS — BP 132/74 | HR 50 | Ht 71.5 in | Wt 159.2 lb

## 2015-02-11 DIAGNOSIS — I340 Nonrheumatic mitral (valve) insufficiency: Secondary | ICD-10-CM

## 2015-02-11 DIAGNOSIS — I429 Cardiomyopathy, unspecified: Secondary | ICD-10-CM | POA: Diagnosis not present

## 2015-02-11 DIAGNOSIS — Z95810 Presence of automatic (implantable) cardiac defibrillator: Secondary | ICD-10-CM

## 2015-02-11 DIAGNOSIS — I739 Peripheral vascular disease, unspecified: Secondary | ICD-10-CM

## 2015-02-11 DIAGNOSIS — I779 Disorder of arteries and arterioles, unspecified: Secondary | ICD-10-CM

## 2015-02-11 DIAGNOSIS — I2510 Atherosclerotic heart disease of native coronary artery without angina pectoris: Secondary | ICD-10-CM | POA: Diagnosis not present

## 2015-02-11 DIAGNOSIS — I5022 Chronic systolic (congestive) heart failure: Secondary | ICD-10-CM

## 2015-02-11 DIAGNOSIS — I350 Nonrheumatic aortic (valve) stenosis: Secondary | ICD-10-CM

## 2015-02-11 NOTE — Assessment & Plan Note (Signed)
He had a carotid Doppler in June, 2015 showing mild disease. Plan will be a follow-up in June of 2017.

## 2015-02-11 NOTE — Assessment & Plan Note (Signed)
His volume status is stable with no diuretics. No change in therapy.

## 2015-02-11 NOTE — Progress Notes (Signed)
Cardiology Office Note   Date:  02/11/2015   ID:  [name], [name] 1932/06/01, MRN [id number]  PCP:  Noralee Space, MD  Cardiologist:  Dola Argyle, MD   Chief Complaint  Patient presents with  . Appointment    Follow-up CAD      History of Present Illness: [name] is a 81 y.o. male who presents today to follow-up coronary disease, mild aortic stenosis, and cardiomyopathy. I saw him last June, 2015. His last echo was in 2011. Ejection fraction at that time was in the 35-40% range. He has an ICD in place and is followed by Dr. [name].. He has not been having any chest pain or shortness of breath. He injured a rib in the past 6 months but there was no syncope or presyncope.    Past Medical History  Diagnosis Date  . Hypertension   . CAD (coronary artery disease)     DES to LAD 2005 /  DES to LAD 2008  . LBBB (left bundle branch block)   . Cardiomyopathy     Ischemic, EF 30%, 2005  . Mitral regurgitation     Mild, echo, October, 2011  . Aortic stenosis     Mild, echo, October, 2011  . AV block, complete     Permanent pacemaker  . ICD (implantable cardiac defibrillator), single, in situ     for syncope and arrhythmia 2005 / generator change June, 2011  . Dyslipidemia   . GERD (gastroesophageal reflux disease)   . DJD (degenerative joint disease)   . Anemia, unspecified   . Vitamin B12 deficiency   . Ejection fraction < 50%     EF 30%, echo, 2005  /  EF 30%, catheterization, 2008  . Syncope     2005, ICD placed  . Systolic CHF, chronic   . Rectal fissure   . Hyperlipidemia     Past Surgical History  Procedure Laterality Date  . Icd implanted  2005  . Laparoscopic cholecystectomy  04/2005    Dr. Lucia Gaskins  . Cardiac catheterization  2008    Patient Active Problem List   Diagnosis Date Noted  . Ejection fraction < 50%     Priority: High  . CAD (coronary artery disease)     Priority: High  . Cardiomyopathy     Priority: High  . Mitral  regurgitation     Priority: High  . Single implantable cardioverter-defibrillator in situ     Priority: High  . Syncope     Priority: High  . Constipation 09/09/2014  . Elevated PSA 09/09/2014  . Gastroenteritis, acute 02/18/2014  . Carotid artery disease without cerebral infarction 01/21/2014  . Automatic implantable cardioverter-defibrillator in situ 09/10/2011  . Chronic systolic heart failure 65/10/5463  . Hypertension   . LBBB (left bundle branch block)   . Aortic stenosis   . Dyslipidemia   . GERD (gastroesophageal reflux disease)   . Anemia 08/03/2008  . Vitamin B 12 deficiency 03/01/2008  . Osteoarthritis 08/19/2007      Current Outpatient Prescriptions  Medication Sig Dispense Refill  . aspirin 81 MG tablet Take 81 mg by mouth daily.      . clopidogrel (PLAVIX) 75 MG tablet Take 75 mg by mouth daily.    . cyanocobalamin (,VITAMIN B-12,) 1000 MCG/ML injection Inject 1,000 mcg into the muscle every 30 (thirty) days.      . fish oil-omega-3 fatty acids 1000 MG capsule Take 2 g by mouth daily.      Marland Kitchen  gemfibrozil (LOPID) 600 MG tablet Take 600 mg by mouth daily.    . metoprolol succinate (TOPROL-XL) 100 MG 24 hr tablet Take 100 mg by mouth daily. Take with or immediately following a meal.    . Multiple Vitamins-Minerals (MENS MULTIVITAMIN PLUS PO) Take 1 tablet by mouth daily.      . Polyethylene Glycol POWD Take 1 scoop by mouth daily.     . ramipril (ALTACE) 10 MG capsule Take 10 mg by mouth daily.    . simvastatin (ZOCOR) 40 MG tablet Take 40 mg by mouth every evening.     No current facility-administered medications for this visit.    Allergies:   Review of patient's allergies indicates no known allergies.    Social History:  The patient  reports that he quit smoking about 62 years ago. His smoking use included Cigarettes. He has a 2 pack-year smoking history. He has never used smokeless tobacco. He reports that he does not drink alcohol or use illicit drugs.    Family History:  The patient's  family history includes Cancer (age of onset: 27) in his mother; Lung cancer in his brother; Stroke in his brother and mother; Stroke (age of onset: 67) in his father.    ROS:  Please see the history of present illness.   Patient denies fever, chills, headache, sweats, rash, change in vision, change in hearing, chest pain, cough, nausea or vomiting, urinary symptoms. All other systems are reviewed and are negative.     PHYSICAL EXAM: VS:  BP 132/74 mmHg  Pulse 50  Ht 5' 11.5" (1.816 m)  Wt 159 lb 3.2 oz (72.213 kg)  BMI 21.90 kg/m2 , Patient is oriented to person time and place. Affect is normal. He looks great. Head is atraumatic. Sclera and conjunctiva are normal. There is no jugular venous distention. Lungs are clear. Respiratory effort is nonlabored. Cardiac exam reveals an old and S2. There is a crescendo decrescendo systolic murmur. The abdomen is soft. There is no peripheral edema. There are no musculoskeletal deformities. There are no skin rashes. Neurologic is grossly intact.  EKG:   EKG reveals AV sequential pacing.   Recent Labs: 09/03/2014: ALT 13; BUN 25*; Creatinine, Ser 1.08; Hemoglobin 12.7*; Platelets 223.0; Potassium 5.1; Sodium 138; TSH 5.40*    Lipid Panel    Component Value Date/Time   CHOL 148 09/03/2014 0736   TRIG 111.0 09/03/2014 0736   HDL 45.00 09/03/2014 0736   CHOLHDL 3 09/03/2014 0736   VLDL 22.2 09/03/2014 0736   LDLCALC 81 09/03/2014 0736   LDLDIRECT 96.3 02/26/2011 0735      Wt Readings from Last 3 Encounters:  02/11/15 159 lb 3.2 oz (72.213 kg)  09/21/14 162 lb 3.2 oz (73.573 kg)  09/09/14 157 lb 4 oz (71.328 kg)      Current medicines are reviewed  The patient understands his medications.    ASSESSMENT AND PLAN:

## 2015-02-11 NOTE — Assessment & Plan Note (Signed)
His last echo in 2011 revealed mild aortic stenosis. It is time now for a follow-up echo to reassess his aortic stenosis.

## 2015-02-11 NOTE — Assessment & Plan Note (Signed)
Patient had a drug-eluting stent to the LAD in 2005 and a drug-eluting stent to the LAD in 2008. He is not having any significant symptoms. I've chosen not to proceed with any exercise testing in this 81 year old gentleman who is doing well. He is on aspirin and Plavix.

## 2015-02-11 NOTE — Assessment & Plan Note (Signed)
Patient has an ICD in place. This was placed in 2005 when he had syncope. He had a generator change in June, 2011.

## 2015-02-11 NOTE — Assessment & Plan Note (Signed)
The most recent ejection fraction was 35-40%. We will do a repeat echo at this time since his last study was 2011. He is on a beta blocker and an ACE inhibitor. Over time I've chosen not to use spironolactone.

## 2015-02-11 NOTE — Assessment & Plan Note (Signed)
There is a history of mitral regurgitation that has been mild. Echo will reassess this.

## 2015-02-11 NOTE — Patient Instructions (Addendum)
**Note De-Identified Britzy Graul Obfuscation** Medication Instructions:  Same-no change  Labwork: None  Testing/Procedures: Your physician has requested that you have an echocardiogram. Echocardiography is a painless test that uses sound waves to create images of your heart. It provides your doctor with information about the size and shape of your heart and how well your heart's chambers and valves are working. This procedure takes approximately one hour. There are no restrictions for this procedure.  Follow-Up: Your physician wants you to follow-up in: 1 week with Dr Meda Coffee. You will receive a reminder letter in the mail two months in advance. If you don't receive a letter, please call our office to schedule the follow-up appointment.

## 2015-02-14 DIAGNOSIS — M79672 Pain in left foot: Secondary | ICD-10-CM | POA: Diagnosis not present

## 2015-02-14 DIAGNOSIS — M7732 Calcaneal spur, left foot: Secondary | ICD-10-CM | POA: Diagnosis not present

## 2015-02-14 DIAGNOSIS — M722 Plantar fascial fibromatosis: Secondary | ICD-10-CM | POA: Diagnosis not present

## 2015-02-16 ENCOUNTER — Ambulatory Visit (HOSPITAL_COMMUNITY): Payer: BLUE CROSS/BLUE SHIELD | Attending: Cardiology

## 2015-02-16 ENCOUNTER — Other Ambulatory Visit: Payer: Self-pay

## 2015-02-16 DIAGNOSIS — I779 Disorder of arteries and arterioles, unspecified: Secondary | ICD-10-CM | POA: Diagnosis not present

## 2015-02-16 DIAGNOSIS — I429 Cardiomyopathy, unspecified: Secondary | ICD-10-CM | POA: Diagnosis not present

## 2015-02-16 DIAGNOSIS — I351 Nonrheumatic aortic (valve) insufficiency: Secondary | ICD-10-CM | POA: Insufficient documentation

## 2015-02-16 DIAGNOSIS — R29898 Other symptoms and signs involving the musculoskeletal system: Secondary | ICD-10-CM | POA: Insufficient documentation

## 2015-02-16 DIAGNOSIS — I371 Nonrheumatic pulmonary valve insufficiency: Secondary | ICD-10-CM | POA: Diagnosis not present

## 2015-02-16 DIAGNOSIS — I059 Rheumatic mitral valve disease, unspecified: Secondary | ICD-10-CM | POA: Insufficient documentation

## 2015-02-16 DIAGNOSIS — I350 Nonrheumatic aortic (valve) stenosis: Secondary | ICD-10-CM

## 2015-02-16 DIAGNOSIS — I358 Other nonrheumatic aortic valve disorders: Secondary | ICD-10-CM | POA: Insufficient documentation

## 2015-02-16 DIAGNOSIS — I739 Peripheral vascular disease, unspecified: Secondary | ICD-10-CM

## 2015-02-22 ENCOUNTER — Telehealth: Payer: Self-pay | Admitting: Cardiology

## 2015-02-22 NOTE — Telephone Encounter (Addendum)
**Note De-Identified [name] Obfuscation** I called the pt and advised him of his Echo results. He verbalized understanding. I also called his daughter, [name], and advised her of the pts Echo results. She verbalized understanding as well.

## 2015-02-22 NOTE — Telephone Encounter (Signed)
New message ° ° ° ° ° °Calling to get echo results °

## 2015-03-07 ENCOUNTER — Telehealth: Payer: Self-pay | Admitting: Pulmonary Disease

## 2015-03-07 DIAGNOSIS — I10 Essential (primary) hypertension: Secondary | ICD-10-CM

## 2015-03-07 DIAGNOSIS — D649 Anemia, unspecified: Secondary | ICD-10-CM

## 2015-03-07 NOTE — Telephone Encounter (Signed)
Attempted to call pt. No answer.  SN - please advise what labs you would like ordered on this patient. Thanks.

## 2015-03-08 NOTE — Telephone Encounter (Signed)
lmtcb for pt.  

## 2015-03-08 NOTE — Telephone Encounter (Signed)
Pt is aware of below. Lab ordered placed. Nothing further needed

## 2015-03-08 NOTE — Telephone Encounter (Signed)
Per SN,  Please order BMET, CBC with diff, and TSH Inform pt of labs   Thanks

## 2015-03-08 NOTE — Telephone Encounter (Signed)
Pt returned call [phone]

## 2015-03-09 ENCOUNTER — Other Ambulatory Visit (INDEPENDENT_AMBULATORY_CARE_PROVIDER_SITE_OTHER): Payer: BLUE CROSS/BLUE SHIELD

## 2015-03-09 DIAGNOSIS — I10 Essential (primary) hypertension: Secondary | ICD-10-CM | POA: Diagnosis not present

## 2015-03-09 DIAGNOSIS — D649 Anemia, unspecified: Secondary | ICD-10-CM

## 2015-03-09 LAB — CBC WITH DIFFERENTIAL/PLATELET
BASOS PCT: 0.3 % (ref 0.0–3.0)
Basophils Absolute: 0 10*3/uL (ref 0.0–0.1)
EOS ABS: 0.2 10*3/uL (ref 0.0–0.7)
EOS PCT: 3.5 % (ref 0.0–5.0)
HCT: 37.7 % — ABNORMAL LOW (ref 39.0–52.0)
HEMOGLOBIN: 12.9 g/dL — AB (ref 13.0–17.0)
LYMPHS PCT: 21.5 % (ref 12.0–46.0)
Lymphs Abs: 1.3 10*3/uL (ref 0.7–4.0)
MCHC: 34.1 g/dL (ref 30.0–36.0)
MCV: 93.8 fl (ref 78.0–100.0)
MONO ABS: 0.5 10*3/uL (ref 0.1–1.0)
Monocytes Relative: 9.4 % (ref 3.0–12.0)
NEUTROS PCT: 65.3 % (ref 43.0–77.0)
Neutro Abs: 3.8 10*3/uL (ref 1.4–7.7)
PLATELETS: 240 10*3/uL (ref 150.0–400.0)
RBC: 4.02 Mil/uL — ABNORMAL LOW (ref 4.22–5.81)
RDW: 14.1 % (ref 11.5–15.5)
WBC: 5.9 10*3/uL (ref 4.0–10.5)

## 2015-03-09 LAB — BASIC METABOLIC PANEL
BUN: 20 mg/dL (ref 6–23)
CO2: 29 mEq/L (ref 19–32)
CREATININE: 1 mg/dL (ref 0.40–1.50)
Calcium: 9.7 mg/dL (ref 8.4–10.5)
Chloride: 104 mEq/L (ref 96–112)
GFR: 76.28 mL/min (ref >60.00)
GLUCOSE: 96 mg/dL (ref 70–99)
POTASSIUM: 4.3 meq/L (ref 3.5–5.1)
Sodium: 141 mEq/L (ref 135–145)

## 2015-03-09 LAB — TSH: TSH: 4.07 u[IU]/mL (ref 0.35–4.50)

## 2015-03-14 ENCOUNTER — Ambulatory Visit (INDEPENDENT_AMBULATORY_CARE_PROVIDER_SITE_OTHER): Payer: BLUE CROSS/BLUE SHIELD | Admitting: Pulmonary Disease

## 2015-03-14 ENCOUNTER — Ambulatory Visit (INDEPENDENT_AMBULATORY_CARE_PROVIDER_SITE_OTHER): Payer: Medicare Other

## 2015-03-14 ENCOUNTER — Encounter: Payer: Self-pay | Admitting: Pulmonary Disease

## 2015-03-14 ENCOUNTER — Ambulatory Visit: Payer: Self-pay | Admitting: Pulmonary Disease

## 2015-03-14 VITALS — BP 130/78 | HR 50 | Temp 97.3°F | Wt 160.6 lb

## 2015-03-14 DIAGNOSIS — M159 Polyosteoarthritis, unspecified: Secondary | ICD-10-CM

## 2015-03-14 DIAGNOSIS — E785 Hyperlipidemia, unspecified: Secondary | ICD-10-CM

## 2015-03-14 DIAGNOSIS — I2510 Atherosclerotic heart disease of native coronary artery without angina pectoris: Secondary | ICD-10-CM

## 2015-03-14 DIAGNOSIS — M150 Primary generalized (osteo)arthritis: Secondary | ICD-10-CM

## 2015-03-14 DIAGNOSIS — I5022 Chronic systolic (congestive) heart failure: Secondary | ICD-10-CM | POA: Diagnosis not present

## 2015-03-14 DIAGNOSIS — I10 Essential (primary) hypertension: Secondary | ICD-10-CM | POA: Diagnosis not present

## 2015-03-14 DIAGNOSIS — E538 Deficiency of other specified B group vitamins: Secondary | ICD-10-CM

## 2015-03-14 DIAGNOSIS — R972 Elevated prostate specific antigen [PSA]: Secondary | ICD-10-CM

## 2015-03-14 DIAGNOSIS — I429 Cardiomyopathy, unspecified: Secondary | ICD-10-CM

## 2015-03-14 DIAGNOSIS — Z95810 Presence of automatic (implantable) cardiac defibrillator: Secondary | ICD-10-CM

## 2015-03-14 DIAGNOSIS — I447 Left bundle-branch block, unspecified: Secondary | ICD-10-CM

## 2015-03-14 DIAGNOSIS — I340 Nonrheumatic mitral (valve) insufficiency: Secondary | ICD-10-CM

## 2015-03-14 DIAGNOSIS — I350 Nonrheumatic aortic (valve) stenosis: Secondary | ICD-10-CM

## 2015-03-14 MED ORDER — CYANOCOBALAMIN 1000 MCG/ML IJ SOLN
1000.0000 ug | Freq: Once | INTRAMUSCULAR | Status: AC
  Administered 2015-03-14: 1000 ug via INTRAMUSCULAR

## 2015-03-14 NOTE — Patient Instructions (Signed)
Today we updated your med list in our EPIC system...    Continue your current medications the same...  Today we reviewed your recent follow up blood work...  We gave you a monthly B12 shot as well...  Good luck w/ your Urology f/u next month...  Call for any questions...  Let's plan a follow up visit in 101mo, sooner if needed for problems.Marland KitchenMarland Kitchen

## 2015-03-14 NOTE — Progress Notes (Signed)
Subjective:    Patient ID: [name], male    DOB: 24-Oct-1931, 81 y.o.   MRN: [id number]  HPI 81 y/o WM here for a follow up visit & review of mult medical problems... 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 ~  SEE PREV EPIC NOTES FOR OLDER DATA >>   ~  September 09, 2013:  34mo ROV & [name] continues to do well- no new complaints or concerns;  We reviewed the following medical problems during today's office visit >>     HBP> on MetopER100AM, Altace10PM;  BP= 136/64 & he denies CP, palpit, dizzy, SOB, edema...    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    Hx Syncope, AV block, Implanted Defib> followed by DrTaylor (seen 2/14 w/ his StJude biventricICD working normally) w/ pacer checks every 2-68mo he says; generator changed 6/11; he denies CP/angina, dizzy/ syncope, etc...    Chol> on Simva40, Lopid600, FishOil; FLP 1/15 shows TChol 171, TG 100, HDL 47, LDL 104    GI- GERD, constip> he had LapChole 2006; he notes constip & had eval by DrPatterson; cannot find reference to prev colonoscopy in Epic or Centricity but he says neg colon 2005 by DrPatterson; Rectal exam showed anal stenosis- dilated via anoscopy; he was treated w/ Linzess & Chronulac & AnusolHC and improved...     DJD> he just uses Tylenol as needed...    VitB12 defic> on B12 shots monthly... We reviewed prob list, meds, xrays and labs> see below for updates >>   LABS 1/15:  FLP- ok on Simva40+Lopid600;  Chems- wnl;  CBC- ok w/ Hg=12.6;  TSH=3.78;  PSA=3.47...  ~  March 09, 2014:  46mo ROV & Dona had a recent GI problem w/ lower abd pain, nausea, diarrhea=>then constipation, no bl seen; he saw TP 02/18/14 w/  labs done & all norm x WBC-16.9 w/ 57%eos; placed on Colace and Miralax and referred to GI-DrPerry; seen 03/02/14 (note reviewed) felt to have chr constipation, hx anal stenosis, and asked to continue Colace & Miralax regularly (no comment made about the eosinophilia)... He is now much improved- feeling better, energy good, still working regularly; he notes that appetite is good, swallowing is OK & he deniesCP, heartburn, reflux, etc... We did f/u CBC showing Hg=11.6 and WBC improved to 6.9 w/ 17%eos (in light of his clinical improvement, we will continue to follow)...    HBP> on MetopER100AM, Altace10PM;  BP= 100/60 & he denies CP, palpit, dizzy, SOB, edema; states feeling well & energy is good...    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    Hx Syncope, AV block, Implanted Defib> followed by DrTaylor (seen 2/15- note reviewed) w/ his StJude biventricICD working normally) w/ pacer checks every 2-34mo he says; generator changed 6/11; he denies CP/angina, dizzy/ syncope, etc...    Chol> on Simva40, Lopid600, FishOil; FLP 6/15  shows TChol 168, TG 117, HDL 41, LDL 104... No changes made, we stressed diet...    GI- GERD, constip> he had LapChole 2006; he notes constip & had eval by DrPatterson; cannot find reference to prev colonoscopy in Epic or Centricity but he says neg colon 2005 by DrPatterson; Rectal exam showed anal stenosis- dilated via anoscopy; he was treated w/ Linzess & Chronulac & AnusolHC and improved...     DJD> he just uses Tylenol as needed...    VitB12 defic> on B12 shots monthly... We reviewed prob list, meds, xrays and labs> see below for updates >> Given B12 shot today...   LABS 6/15 & 7/15 showed> FLP- fair w/  LDL=104;  Chems- wnl;  CBC- ok x WBC=16.9 w/ 57%eos; f/u CBC 8/15 showed Hg=11.6, WBC=6.9 w/ 17%eos...  CXR 8/15 showed norm heart size, Pacer/ICD stable, stent noted, lungs clear, NAD...    ~  September 09, 2014:  66mo ROV & [name] was injured at work (fell pulling yarn out of a machine) & sustained fx rib, went to ER 08/14/14, note reviewed, XRay w/ left anterolat 6th rib fx, treated w/ rest/ heat/ Tramadol/ Tylenol/ etc... Despite being 81 y/o he says he's going to keep on going!     He remains on MetopER100 & Altace10; BP= 138/80 & he denies any current CP, palpit, SOB, edema...    Hx CAD, cardiomyop, chr sys CHF, AS & MR, AVB & defib> followed by Benay Spice & DrTaylor on ASA/ Plavix & he states stable...    On Simva40, Lopid600; FLP 1/16 showed TChol 148, TG 111, HDL 45, LDL 81    Labs show elev PSA= 5.27 & we will refer to Urology... We reviewed prob list, meds, xrays and labs> see below for updates >>    LABS 1/16:  FLP looks good on Simva40/Lopid600; Chems- wnl;  CBC- wnl;  TSH=5.40;  PSA=5.27...   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 We reviewed the following medical problems during today's office visit >>     HBP> on MetopER100AM, Altace10PM;  BP= 130/78 & he denies CP, palpit, dizzy, SOB, edema; states feeling well & energy is good...    CAD, Ischemic cardiomyop EF=35-40%, chr sys heart failure, AS & MR> on ASA/ Plavix; followed by Benay Spice & seen 7/16 (note reviewed); he's had prev MI & mult interventions, last 2DEcho 10/11 showed EF=35-40% & mild HK;  Repeat 2DEcho 7/16 showed decr LVF w/ EF=40-45%, AK of apex & septum, Gr1DD, paradox septal motion, AoV sclerosis &mild AI, mild MV leaflet thickening (no change in meds).    Hx Syncope, AV block, Implanted Defib> followed by DrTaylor (seen 2/16- note  reviewed) w/ his StJude biventricICD working normally) w/ pacer checks every 2-64mo he says; generator changed 6/11; he denies CP/angina, dizzy/ syncope.     Chol> on Simva40, Lopid600, FishOil; FLP 1/16 shows TChol 148, TG 111, HDL 45, LDL 81... No changes made, we stressed diet...    GI- GERD, constip> he had LapChole 2006; he notes constip & had eval by DrPatterson; cannot find reference to prev colonoscopy in Epic or Centricity but he says neg colon 2005 by DrPatterson; Rectal exam showed anal stenosis- dilated via anoscopy; he was treated w/ Linzess & Chronulac & AnusolHC and improved (now  just using Miralax)...     GU- elev PSA> He saw DrHerrick & now follows w/ him for elev PSA- they are approaching the prob conservatively w/ PSA Q43mo & consider hormone therapy if it continues to rise...    DJD> he just uses Tylenol as needed...    VitB12 defic> on B12 shots monthly... We reviewed prob list, meds, xrays and labs> see below for updates >>   LABS 8/16:  Chems- wnl;  CBC- wnl;  TSH=4.07           Problem List:  HYPERTENSION (ICD-401.9) - controlled on TOPROL XL 100mg /d & ALTACE 10mg /d... takes meds regularly and tolerates well...  ~  7/12: BP= 120/68 and OK at home per pt; he gets rare HA which he believes is related to his BP; denies fatigue, visual changes, CP, palipit, dizziness, syncope, dyspnea, edema, etc... ~  CXR 9/12 showed dual lead pacer/ AICD, norm heart size, clear lungs, NAD... ~  2/13:  BP= 124/68 & he remains asymptomatic... ~  8/13:  BP= 122/58 & he denies CP, palpit, SOB, edema, etc... ~  2/14: on MetopER100, Altace10;  BP= 148/60 & he denies CP, palpit, dizzy, SOB, edema. ~  8/14: on MetopER100 in AM, Altace10 in PM;  BP= 120/70 & he denies CP, palpit, dizzy, SOB, edema. ~  2/15: on MetopER100 & Altace10; BP= 136/64 & he remains asymptomatic... ~  8/15: on MetopER100AM, Altace10PM;  BP= 100/60 & he denies CP, palpit, dizzy, SOB, edema; states feeling well & energy is  good ~  CXR 8/15 showed norm heart size, Pacer/ICD stable, stent noted, lungs clear, NAD. ~  2/16:  He remains on MetopER100 & Altace10; BP= 138/80 & he denies any current CP, palpit, SOB, edema.  CAD (ICD-414.00) - on ASA 81mg /d & PLAVIX 75mg /d> followed by Benay Spice... ISCHEMIC CARDIOMYOPATHY (ICD-414.8) - he's had prev MI w/ mult caths/ interventions. CHRONIC SYSTOLIC HEART FAILURE (KGY-185.63) AORTIC STENOSIS (ICD-424.1) > Mild by 2DEcho 10/11 MITRAL REGURGITATION (ICD-396.3) ~  last cath 5/08 showed norm Lmain, patent stent in prox LAD, 80-90% lesion in mid-LAD w/ PCI & stent placed, & 50% ostial stenosis of 1st diagonal branch of LAD; 40%mid & 70%distal CIRC; 50%prox 40%mid 40%distal RCA; Global HK w/EF=30%. ~  NuclearStressTest 4/09 showed infer scar, no ischemia, EF=40%. ~  2DEcho 10/11 showed mild LVH, decr LVF w/ EF= 35-40% & mild HK, gr1DD, mild AS & MR, mild LAdil. ~  EKG 10/12 showed pacer rhythm... 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  Hx of SYNCOPE (ICD-780.2) AV BLOCK, COMPLETE (ICD-426.0) AUTOMATIC IMPLANTABLE CARDIAC DEFIBRILLATOR SITU (ICD-V45.02) ~  Pacer/ ICD per DrTaylor w/ several adjustments in 2008... ~  5/11:  f/u DrTaylor w/ decision for elective generator change> done 6/11 & tol well. ~  He continues to f/u w/ DrTaylor every 6 months... ~  2/13: f/u DrTaylor doing satis, device working normally, no changes made & f/u 67yr... ~  2/14: Hx Syncope, AV block, Implanted Defib> followed by DrTaylor w/  pacer checks every 2-79mo he  says; generator changed 6/11; he denies CP/angina, dizzy/ syncope, etc. ~  2/15: he had f/u DrTaylor> stable & doing satis...  HYPERCHOLESTEROLEMIA (ICD-272.0) - takes SIMVASTATIN 40mg /d, GEMFIBRIZOL 600mg /d, & FISH OIL Bid... ~  [place] 08/14/07 on Simva40 showed TChol 163, TG 94, HDL 34, LDL 111... rec incr Simvastatin to 80mg /d. ~  FLP 1/10 showed TChol 125, TG 67, HDL 32, LDL 80... rec- continue same. ~  FLP 1/11 on Simva80+Gemfib600 showed TChol 137, TG 99, HDL 39, LDL 78 ~  FLP 1/12 on Simva40+Gemfib600 showed TChol 157, TG 139, HDL 38, LDL 92 ~  FLP 1/13 on Simva40+Gemfib600 showed TChol 149, TG 50, HDL 39, LDL 100 ~  FLP 1/14 on Simva40+Gemfib600 showed TChol 152, TG 74, HDL 38, LDL 99  ~  FLP 1/15 on Simva40+Gemfib600 showed TChol 159, TG 64, HDL 43, LDL 103 ~  FLP 6/15 on Simva40+Gemfib600 showed TChol 168, TG 117, HDL 41, LDL 104  ~  FLP 1/16 on Simva40+Gemfib600 showed TChol 148, TG 111, HDL 45, LDL 81  BORDERLINE TSH >> ~  Labs 1/15 showed TSH= 3.78 ~  Labs 1/16 showed an elev TSH for the 1st time w/ TSH= 5.40, he is clinically euthyroid.  GERD (ICD-530.81) - he had ERCP in 8/06 w/ norm ducts, +gallstones, s/p lap chole 2006... he uses OTC meds Prn... ~  he notes that he had a colonoscopy in the past from DrPatterson, & he reports that it was neg. ~  7/15> GI eval from DrPerry, he didn't cooment on the eosinophilia...  ELEVATED PSA >> ~  Labs 1/16 showed an elev PSA for the 1st time w/ PSA= 5.27  DEGENERATIVE JOINT DISEASE (ICD-715.90) - he tried Mobic but it bothered his stomach, therefore uses Tylenol Prn.  VITAMIN B12 DEFICIENCY (ICD-266.2) - old chart not available- ? details of this Dx... on B12 shots xyrs. ~  labs 1/09 showed Hg=12.9 ~  labs 1/10 showed Hg= 12.7 ~  labs 1/11 showed Hg= 12.6 ~  labs 1/12 showed Hg= 13.2 ~  Labs 1/13 showed Hg= 12.6 ~  Labs 1/14 showed Hg= 12.7 ~  Labs 1/15 showed Hg= 12.6, WBC= 5.7 w/ 10%eos ~  Labs 7/15 showed Hg= 11.7, WBC= 16.9 w/  57%eos... ~  Labs 8/15 showed Hg= 11.6, WBC= 6.9, w/ 17%eos... ~  Labs 1/16 showed Hg= 12.7, WBC= 4.4 w/ 9% eos  Health Maintenance: ~  GI:  Followed by DrPatterson ~  GU:  PSA checked yearly & remains wnl... ~  Immunizations:  he gets the yearly Flu vaccine;  [place] he says;  given Pneumovax 1/11 age 89.   Past Surgical History  Procedure Laterality Date  . Icd implanted  2005  . Laparoscopic cholecystectomy  04/2005    Dr. Lucia Gaskins  . Cardiac catheterization  2008    Outpatient Encounter Prescriptions as of 03/14/2015  Medication Sig  . aspirin 81 MG tablet Take 81 mg by mouth daily.    . clopidogrel (PLAVIX) 75 MG tablet Take 75 mg by mouth daily.  . cyanocobalamin (,VITAMIN B-12,) 1000 MCG/ML injection Inject 1,000 mcg into the muscle every 30 (thirty) days.    . fish oil-omega-3 fatty acids 1000 MG capsule Take 2 g by mouth daily.    Marland Kitchen gemfibrozil (LOPID) 600 MG tablet Take 600 mg by mouth daily.  . metoprolol succinate (TOPROL-XL) 100 MG 24 hr tablet Take 100 mg by mouth daily. Take with or  immediately following a meal.  . Multiple Vitamins-Minerals (MENS MULTIVITAMIN PLUS PO) Take 1 tablet by mouth daily.    . Polyethylene Glycol POWD Take 1 scoop by mouth daily.   . ramipril (ALTACE) 10 MG capsule Take 10 mg by mouth daily.  . simvastatin (ZOCOR) 40 MG tablet Take 40 mg by mouth every evening.   No facility-administered encounter medications on file as of 03/14/2015.    No Known Allergies   Current Medications, Allergies, Past Medical History, Past Surgical History, Family History, and Social History were reviewed in [facility] record.    Review of Systems        See HPI - all other systems neg except as noted...  The patient complains of dyspnea on exertion.  The patient denies anorexia, fever, weight loss, weight gain, vision loss, decreased hearing, hoarseness, chest pain, syncope, peripheral edema, prolonged  cough, headaches, hemoptysis, abdominal pain, melena, hematochezia, severe indigestion/heartburn, hematuria, incontinence, muscle weakness, suspicious skin lesions, transient blindness, difficulty walking, depression, unusual weight change, abnormal bleeding, enlarged lymph nodes, and angioedema.     Objective:   Physical Exam     WD, WN, 81 y/o WM in NAD... wt=170# ht=6'1" BMI=22... GENERAL:  Alert & oriented; pleasant & cooperative... HEENT:  [place]/AT, EOM-wnl, PERRLA, EACs-clear, TMs-wnl, NOSE-clear, THROAT-clear & wnl. NECK:  Supple w/ fairROM; no JVD; normal carotid impulses w/o bruits; no thyromegaly or nodules palpated; no lymphadenopathy. CHEST:  Clear to P & A; without wheezes/ rales/ or rhonchi heard... HEART:  Pacer in L upper chest area~ nontender, RR, gr 1/6 SEM without rubs or gallops detected... ABDOMEN:  Soft & nontender; normal bowel sounds; no organomegaly or masses palpated... EXT: without deformities, mild arthritic changes; no varicose veins/ venous insuffic/ or edema. NEURO:  CN's intact; no focal neuro deficits apprec... DERM:  No lesions noted; no rash etc...  RADIOLOGY DATA:  Reviewed in the EPIC EMR & discussed w/ the patient...   LABORATORY DATA:  Reviewed in the EPIC EMR & discussed w/ the patient...     Assessment & Plan:    HBP>  Controlled on Metoprolol & Ramipril, continue same...  CAD, Cardiomyopathy, CHF, ValvHeartDis w/ AS & MR>  Followed by DrKatz/ [name] & stable on ASA/ Plavix, still working 3d per week etc...  Hx syncope, AVBlock, Pacer/AICD>  Followed by DrTaylor & stable, continue same...  LIPIDS>  FLP looks OK on Simva40, Gemfib 600mg /d, & Fish Oil...  GI symptoms 7/15>  Resolved w/ Colace & Miralax... He feels he is back to baseline...  GU> Elev PSA noted 1/16 & referred to Urology...  DJD>  He uses Tylenol as needed; still working regularly...  B-12 defic>  He remains on B12 shots monthly...   Patient's Medications  New Prescriptions    No medications on file  Previous Medications   ASPIRIN 81 MG TABLET    Take 81 mg by mouth daily.     CLOPIDOGREL (PLAVIX) 75 MG TABLET    Take 75 mg by mouth daily.   CYANOCOBALAMIN (,VITAMIN B-12,) 1000 MCG/ML INJECTION    Inject 1,000 mcg into the muscle every 30 (thirty) days.     FISH OIL-OMEGA-3 FATTY ACIDS 1000 MG CAPSULE    Take 2 g by mouth daily.     GEMFIBROZIL (LOPID) 600 MG TABLET    Take 600 mg by mouth daily.   METOPROLOL SUCCINATE (TOPROL-XL) 100 MG 24 HR TABLET    Take 100 mg by mouth daily. Take with or immediately following a  meal.   MULTIPLE VITAMINS-MINERALS (MENS MULTIVITAMIN PLUS PO)    Take 1 tablet by mouth daily.     POLYETHYLENE GLYCOL POWD    Take 1 scoop by mouth daily.    RAMIPRIL (ALTACE) 10 MG CAPSULE    Take 10 mg by mouth daily.   SIMVASTATIN (ZOCOR) 40 MG TABLET    Take 40 mg by mouth every evening.  Modified Medications   No medications on file  Discontinued Medications   No medications on file

## 2015-03-24 ENCOUNTER — Encounter: Payer: Self-pay | Admitting: Internal Medicine

## 2015-03-24 ENCOUNTER — Ambulatory Visit (INDEPENDENT_AMBULATORY_CARE_PROVIDER_SITE_OTHER): Payer: BLUE CROSS/BLUE SHIELD | Admitting: *Deleted

## 2015-03-24 DIAGNOSIS — I429 Cardiomyopathy, unspecified: Secondary | ICD-10-CM | POA: Diagnosis not present

## 2015-03-24 DIAGNOSIS — I5022 Chronic systolic (congestive) heart failure: Secondary | ICD-10-CM

## 2015-03-24 NOTE — Progress Notes (Signed)
Remote ICD transmission.   

## 2015-04-01 LAB — CUP PACEART REMOTE DEVICE CHECK
Battery Remaining Longevity: 3.1
Battery Remaining Percentage: 36 %
Brady Statistic RA Percent Paced: 39 %
Brady Statistic RV Percent Paced: 99 %
HighPow Impedance: 38 Ohm
Lead Channel Pacing Threshold Amplitude: 0.875 V
Lead Channel Sensing Intrinsic Amplitude: 1.6 mV
Lead Channel Setting Pacing Amplitude: 2 V
Lead Channel Setting Pacing Pulse Width: 0.5 ms
MDC IDC MSMT LEADCHNL RA IMPEDANCE VALUE: 430 Ohm
MDC IDC MSMT LEADCHNL RV IMPEDANCE VALUE: 340 Ohm
MDC IDC MSMT LEADCHNL RV PACING THRESHOLD PULSEWIDTH: 0.5 ms
MDC IDC PG SERIAL: 610170
MDC IDC SESS DTM: 20160826092350
MDC IDC SET LEADCHNL RV PACING AMPLITUDE: 2 V
MDC IDC SET LEADCHNL RV SENSING SENSITIVITY: 0.5 mV
Zone Setting Detection Interval: 300 ms
Zone Setting Detection Interval: 375 ms

## 2015-04-06 ENCOUNTER — Encounter: Payer: Self-pay | Admitting: Cardiology

## 2015-04-18 ENCOUNTER — Ambulatory Visit (INDEPENDENT_AMBULATORY_CARE_PROVIDER_SITE_OTHER): Payer: BLUE CROSS/BLUE SHIELD

## 2015-04-18 DIAGNOSIS — E538 Deficiency of other specified B group vitamins: Secondary | ICD-10-CM | POA: Diagnosis not present

## 2015-04-18 MED ORDER — CYANOCOBALAMIN 1000 MCG/ML IJ SOLN
1000.0000 ug | Freq: Once | INTRAMUSCULAR | Status: AC
  Administered 2015-04-18: 1000 ug via INTRAMUSCULAR

## 2015-04-22 DIAGNOSIS — R972 Elevated prostate specific antigen [PSA]: Secondary | ICD-10-CM | POA: Diagnosis not present

## 2015-05-16 ENCOUNTER — Ambulatory Visit (INDEPENDENT_AMBULATORY_CARE_PROVIDER_SITE_OTHER): Payer: BLUE CROSS/BLUE SHIELD

## 2015-05-16 DIAGNOSIS — E538 Deficiency of other specified B group vitamins: Secondary | ICD-10-CM

## 2015-05-16 MED ORDER — CYANOCOBALAMIN 1000 MCG/ML IJ SOLN
1000.0000 ug | Freq: Once | INTRAMUSCULAR | Status: AC
  Administered 2015-05-16: 1000 ug via INTRAMUSCULAR

## 2015-06-20 ENCOUNTER — Ambulatory Visit (INDEPENDENT_AMBULATORY_CARE_PROVIDER_SITE_OTHER): Payer: BLUE CROSS/BLUE SHIELD

## 2015-06-20 DIAGNOSIS — E538 Deficiency of other specified B group vitamins: Secondary | ICD-10-CM

## 2015-06-20 MED ORDER — CYANOCOBALAMIN 1000 MCG/ML IJ SOLN
1000.0000 ug | Freq: Once | INTRAMUSCULAR | Status: AC
  Administered 2015-06-20: 1000 ug via INTRAMUSCULAR

## 2015-06-27 ENCOUNTER — Ambulatory Visit (INDEPENDENT_AMBULATORY_CARE_PROVIDER_SITE_OTHER): Payer: BLUE CROSS/BLUE SHIELD | Admitting: *Deleted

## 2015-06-27 DIAGNOSIS — I429 Cardiomyopathy, unspecified: Secondary | ICD-10-CM

## 2015-06-27 DIAGNOSIS — I5022 Chronic systolic (congestive) heart failure: Secondary | ICD-10-CM

## 2015-06-27 NOTE — Progress Notes (Signed)
Remote ICD transmission.   

## 2015-07-11 LAB — CUP PACEART REMOTE DEVICE CHECK
Battery Remaining Percentage: 35 %
Brady Statistic RA Percent Paced: 39 %
HighPow Impedance: 39 Ohm
Implantable Lead Implant Date: 20050509
Implantable Lead Location: 753860
Lead Channel Pacing Threshold Pulse Width: 0.5 ms
Lead Channel Setting Pacing Amplitude: 2 V
Lead Channel Setting Pacing Pulse Width: 0.5 ms
MDC IDC LEAD IMPLANT DT: 20050509
MDC IDC LEAD LOCATION: 753859
MDC IDC MSMT BATTERY REMAINING LONGEVITY: 33 mo
MDC IDC MSMT LEADCHNL RA IMPEDANCE VALUE: 430 Ohm
MDC IDC MSMT LEADCHNL RA SENSING INTR AMPL: 2.5 mV
MDC IDC MSMT LEADCHNL RV IMPEDANCE VALUE: 310 Ohm
MDC IDC MSMT LEADCHNL RV PACING THRESHOLD AMPLITUDE: 0.875 V
MDC IDC SESS DTM: 20161205101251
MDC IDC SET LEADCHNL RV PACING AMPLITUDE: 2 V
MDC IDC SET LEADCHNL RV SENSING SENSITIVITY: 0.5 mV
MDC IDC STAT BRADY RV PERCENT PACED: 99 % — AB
Pulse Gen Serial Number: 610170

## 2015-07-12 ENCOUNTER — Encounter: Payer: Self-pay | Admitting: Cardiology

## 2015-07-18 ENCOUNTER — Ambulatory Visit (INDEPENDENT_AMBULATORY_CARE_PROVIDER_SITE_OTHER): Payer: BLUE CROSS/BLUE SHIELD

## 2015-07-18 DIAGNOSIS — E538 Deficiency of other specified B group vitamins: Secondary | ICD-10-CM

## 2015-07-19 MED ORDER — CYANOCOBALAMIN 1000 MCG/ML IJ SOLN
1000.0000 ug | Freq: Once | INTRAMUSCULAR | Status: AC
  Administered 2015-07-19: 1000 ug via INTRAMUSCULAR

## 2015-07-22 DIAGNOSIS — R972 Elevated prostate specific antigen [PSA]: Secondary | ICD-10-CM | POA: Diagnosis not present

## 2015-08-15 ENCOUNTER — Ambulatory Visit (INDEPENDENT_AMBULATORY_CARE_PROVIDER_SITE_OTHER): Payer: BLUE CROSS/BLUE SHIELD

## 2015-08-15 DIAGNOSIS — E538 Deficiency of other specified B group vitamins: Secondary | ICD-10-CM | POA: Diagnosis not present

## 2015-08-17 MED ORDER — CYANOCOBALAMIN 1000 MCG/ML IJ SOLN
1000.0000 ug | Freq: Once | INTRAMUSCULAR | Status: AC
  Administered 2015-08-15: 1000 ug via INTRAMUSCULAR

## 2015-08-19 DIAGNOSIS — M19012 Primary osteoarthritis, left shoulder: Secondary | ICD-10-CM | POA: Diagnosis not present

## 2015-08-19 DIAGNOSIS — M25512 Pain in left shoulder: Secondary | ICD-10-CM | POA: Diagnosis not present

## 2015-08-19 DIAGNOSIS — M7542 Impingement syndrome of left shoulder: Secondary | ICD-10-CM | POA: Diagnosis not present

## 2015-08-19 DIAGNOSIS — M25612 Stiffness of left shoulder, not elsewhere classified: Secondary | ICD-10-CM | POA: Diagnosis not present

## 2015-08-24 ENCOUNTER — Other Ambulatory Visit: Payer: Medicare Other

## 2015-08-24 ENCOUNTER — Ambulatory Visit (INDEPENDENT_AMBULATORY_CARE_PROVIDER_SITE_OTHER): Payer: BLUE CROSS/BLUE SHIELD | Admitting: Adult Health

## 2015-08-24 ENCOUNTER — Encounter: Payer: Self-pay | Admitting: Adult Health

## 2015-08-24 VITALS — BP 112/78 | HR 50 | Temp 98.1°F | Ht 71.5 in | Wt 162.8 lb

## 2015-08-24 DIAGNOSIS — B370 Candidal stomatitis: Secondary | ICD-10-CM

## 2015-08-24 DIAGNOSIS — J020 Streptococcal pharyngitis: Secondary | ICD-10-CM

## 2015-08-24 LAB — BETA STREP SCREEN: Streptococcus, Group A Screen (Direct): NEGATIVE

## 2015-08-24 MED ORDER — MAGIC MOUTHWASH: ORAL | Status: DC

## 2015-08-24 MED ORDER — FLUCONAZOLE 100 MG PO TABS: ORAL_TABLET | ORAL | Status: DC

## 2015-08-24 NOTE — Patient Instructions (Signed)
Your strep swab was negative   Magic mouthwash swish and swallow three times daily as needed  Diflucan - 2 tabs by mouth today then 1 tab by mouth x 7 days  Good denture cleaning  Follow up with Dr. Lenna Gilford in February as previously scheduled  Call or return sooner if needed

## 2015-08-24 NOTE — Progress Notes (Signed)
Chief Complaint  Patient presents with  . Acute Visit    c/o sore throat with blisters. discomfort more on left side when swallowing. Notice yesterday.  Denies any fever, chills or night sweats.      Tests Strep swab >>> negative   Past medical hx Past Medical History  Diagnosis Date  . Hypertension   . CAD (coronary artery disease)     DES to LAD 2005 /  DES to LAD 2008  . LBBB (left bundle branch block)   . Cardiomyopathy     Ischemic, EF 30%, 2005  . Mitral regurgitation     Mild, echo, October, 2011  . Aortic stenosis     Mild, echo, October, 2011  . AV block, complete (HCC)     Permanent pacemaker  . ICD (implantable cardiac defibrillator), single, in situ     for syncope and arrhythmia 2005 / generator change June, 2011  . Dyslipidemia   . GERD (gastroesophageal reflux disease)   . DJD (degenerative joint disease)   . Anemia, unspecified   . Vitamin B12 deficiency   . Ejection fraction < 50%     EF 30%, echo, 2005  /  EF 30%, catheterization, 2008  . Syncope     2005, ICD placed  . Systolic CHF, chronic ([place])   . Rectal fissure   . Hyperlipidemia      Past surgical hx, Allergies, Family hx, Social hx all reviewed.  Current Outpatient Prescriptions on File Prior to Visit  Medication Sig  . aspirin 81 MG tablet Take 81 mg by mouth daily.    . clopidogrel (PLAVIX) 75 MG tablet Take 75 mg by mouth daily.  . cyanocobalamin (,VITAMIN B-12,) 1000 MCG/ML injection Inject 1,000 mcg into the muscle every 30 (thirty) days.    . fish oil-omega-3 fatty acids 1000 MG capsule Take 2 g by mouth daily.    Marland Kitchen gemfibrozil (LOPID) 600 MG tablet Take 600 mg by mouth daily.  . metoprolol succinate (TOPROL-XL) 100 MG 24 hr tablet Take 100 mg by mouth daily. Take with or immediately following a meal.  . Multiple Vitamins-Minerals (MENS MULTIVITAMIN PLUS PO) Take 1 tablet by mouth daily.    . Polyethylene Glycol POWD Take 1 scoop by mouth daily.   . ramipril (ALTACE) 10 MG capsule  Take 10 mg by mouth daily.  . simvastatin (ZOCOR) 40 MG tablet Take 40 mg by mouth every evening.   No current facility-administered medications on file prior to visit.     Vital Signs BP 112/78 mmHg  Pulse 50  Temp(Src) 98.1 F (36.7 C) (Oral)  Ht 5' 11.5" (1.816 m)  Wt 162 lb 12.8 oz (73.846 kg)  BMI 22.39 kg/m2  SpO2 99%  History of Present Illness [name] is a 82 y.o. male with hx HTN, CAD, systolic heart failure presented 1/18 for acute OV with 1 day hx sore throat.  States he was eating peanuts last night and had difficulty swallowing then noticed some "blisters".  Denies fevers, chills, nausea, vomiting, neck or lymph node swelling, SOB.  Ate full breakfast this am without difficulty.  Denies difficulty or pain with swallowing saliva.  No noticeable GERD s/s.  No Has had mild intermittent cough over last 3 weeks which was "not bothersome" and  he associated with seasonal allergies.  No hx herpes or "cold sores".  No recent sick contacts.  No hx peanut or other food allergies. Eats peanuts regularly.  Does wear full upper dentures.  Physical Exam  General - Pleasant elderly male, No distress ENT - No sinus tenderness, no oral exudate, no palpable LAN, no thyromegaly.  Posterior pharynx is erythematous with few scattered white patches.  Tonsils erythematous but no significant swelling.  No other ulcerations noted in oral cavity.  Swallows without difficulty.   Cardiac - s1s2 regular, no murmur Chest - resps even non labored on RA, No wheeze/rales/dullness  Abd - Soft, non-tender Ext - No edema Neuro - awake, alert, appropriate, Normal strength Skin - No rashes Psych - normal mood, and behavior   Assessment/Plan  Oral/pharyngeal candidiasis    Patient Instructions  Your strep swab was negative   Magic mouthwash swish and swallow three times daily as needed  Diflucan - 2 tabs by mouth today then 1 tab by mouth x 7 days  Good denture cleaning  Follow up with Dr.  Lenna Gilford in February as previously scheduled  Call or return sooner if needed        Nickolas Madrid, NP 08/24/2015  11:04 AM Pager: [phone]

## 2015-08-29 DIAGNOSIS — M7542 Impingement syndrome of left shoulder: Secondary | ICD-10-CM | POA: Diagnosis not present

## 2015-09-06 ENCOUNTER — Telehealth: Payer: Self-pay | Admitting: Pulmonary Disease

## 2015-09-06 DIAGNOSIS — E785 Hyperlipidemia, unspecified: Secondary | ICD-10-CM

## 2015-09-06 DIAGNOSIS — I10 Essential (primary) hypertension: Secondary | ICD-10-CM

## 2015-09-06 DIAGNOSIS — R972 Elevated prostate specific antigen [PSA]: Secondary | ICD-10-CM

## 2015-09-06 NOTE — Telephone Encounter (Signed)
Spoke with pt, states he needs to have labs drawn a few days before his appt with SN.  Pt has pending appt on 09/14/15. SN please advise if pt needs labs, and if so what labs are needed.  Thanks!

## 2015-09-07 NOTE — Telephone Encounter (Signed)
Per SN: Ok to order FLP, BMET, Liver, CBC with diff, TSH, and PSA.   LMTCB for pt.

## 2015-09-07 NOTE — Telephone Encounter (Signed)
Labs ordered and [facility] for the pt to be made aware

## 2015-09-09 ENCOUNTER — Other Ambulatory Visit (INDEPENDENT_AMBULATORY_CARE_PROVIDER_SITE_OTHER): Payer: BLUE CROSS/BLUE SHIELD

## 2015-09-09 DIAGNOSIS — E785 Hyperlipidemia, unspecified: Secondary | ICD-10-CM | POA: Diagnosis not present

## 2015-09-09 DIAGNOSIS — R972 Elevated prostate specific antigen [PSA]: Secondary | ICD-10-CM

## 2015-09-09 DIAGNOSIS — I10 Essential (primary) hypertension: Secondary | ICD-10-CM | POA: Diagnosis not present

## 2015-09-09 LAB — LIPID PANEL
CHOL/HDL RATIO: 3
Cholesterol: 148 mg/dL (ref 0–200)
HDL: 52.6 mg/dL (ref >39.00)
LDL CALC: 85 mg/dL (ref 0–99)
NonHDL: 95.75
TRIGLYCERIDES: 55 mg/dL (ref 0.0–149.0)
VLDL: 11 mg/dL (ref 0.0–40.0)

## 2015-09-09 LAB — BASIC METABOLIC PANEL
BUN: 17 mg/dL (ref 6–23)
CHLORIDE: 108 meq/L (ref 96–112)
CO2: 30 mEq/L (ref 19–32)
CREATININE: 0.95 mg/dL (ref 0.40–1.50)
Calcium: 9.5 mg/dL (ref 8.4–10.5)
GFR: 80.83 mL/min (ref >60.00)
Glucose, Bld: 101 mg/dL — ABNORMAL HIGH (ref 70–99)
Potassium: 5 mEq/L (ref 3.5–5.1)
Sodium: 143 mEq/L (ref 135–145)

## 2015-09-09 LAB — HEPATIC FUNCTION PANEL
ALBUMIN: 4.1 g/dL (ref 3.5–5.2)
ALT: 15 U/L (ref 0–53)
AST: 23 U/L (ref 0–37)
Alkaline Phosphatase: 60 U/L (ref 39–117)
BILIRUBIN TOTAL: 0.9 mg/dL (ref 0.2–1.2)
Bilirubin, Direct: 0.2 mg/dL (ref 0.0–0.3)
Total Protein: 6.4 g/dL (ref 6.0–8.3)

## 2015-09-09 LAB — CBC WITH DIFFERENTIAL/PLATELET
BASOS ABS: 0 10*3/uL (ref 0.0–0.1)
BASOS PCT: 0.3 % (ref 0.0–3.0)
EOS ABS: 0.2 10*3/uL (ref 0.0–0.7)
Eosinophils Relative: 3.1 % (ref 0.0–5.0)
HEMATOCRIT: 35.3 % — AB (ref 39.0–52.0)
HEMOGLOBIN: 12 g/dL — AB (ref 13.0–17.0)
LYMPHS PCT: 20.6 % (ref 12.0–46.0)
Lymphs Abs: 1.1 10*3/uL (ref 0.7–4.0)
MCHC: 34 g/dL (ref 30.0–36.0)
MCV: 93.8 fl (ref 78.0–100.0)
Monocytes Absolute: 0.5 10*3/uL (ref 0.1–1.0)
Monocytes Relative: 9.4 % (ref 3.0–12.0)
Neutro Abs: 3.5 10*3/uL (ref 1.4–7.7)
Neutrophils Relative %: 66.6 % (ref 43.0–77.0)
Platelets: 209 10*3/uL (ref 150.0–400.0)
RBC: 3.76 Mil/uL — AB (ref 4.22–5.81)
RDW: 13.7 % (ref 11.5–15.5)
WBC: 5.2 10*3/uL (ref 4.0–10.5)

## 2015-09-09 LAB — PSA: PSA: 12.82 ng/mL — AB (ref 0.10–4.00)

## 2015-09-09 LAB — TSH: TSH: 3.64 u[IU]/mL (ref 0.35–4.50)

## 2015-09-14 ENCOUNTER — Encounter: Payer: Self-pay | Admitting: Pulmonary Disease

## 2015-09-14 ENCOUNTER — Ambulatory Visit (INDEPENDENT_AMBULATORY_CARE_PROVIDER_SITE_OTHER): Payer: BLUE CROSS/BLUE SHIELD | Admitting: Pulmonary Disease

## 2015-09-14 VITALS — BP 140/68 | HR 54 | Temp 98.0°F | Ht 71.0 in | Wt 166.6 lb

## 2015-09-14 DIAGNOSIS — I5022 Chronic systolic (congestive) heart failure: Secondary | ICD-10-CM

## 2015-09-14 DIAGNOSIS — I10 Essential (primary) hypertension: Secondary | ICD-10-CM | POA: Diagnosis not present

## 2015-09-14 DIAGNOSIS — Z95810 Presence of automatic (implantable) cardiac defibrillator: Secondary | ICD-10-CM

## 2015-09-14 DIAGNOSIS — I429 Cardiomyopathy, unspecified: Secondary | ICD-10-CM

## 2015-09-14 DIAGNOSIS — I2510 Atherosclerotic heart disease of native coronary artery without angina pectoris: Secondary | ICD-10-CM

## 2015-09-14 DIAGNOSIS — M159 Polyosteoarthritis, unspecified: Secondary | ICD-10-CM

## 2015-09-14 DIAGNOSIS — I340 Nonrheumatic mitral (valve) insufficiency: Secondary | ICD-10-CM

## 2015-09-14 DIAGNOSIS — M150 Primary generalized (osteo)arthritis: Secondary | ICD-10-CM

## 2015-09-14 DIAGNOSIS — I350 Nonrheumatic aortic (valve) stenosis: Secondary | ICD-10-CM

## 2015-09-14 DIAGNOSIS — R972 Elevated prostate specific antigen [PSA]: Secondary | ICD-10-CM

## 2015-09-14 DIAGNOSIS — E538 Deficiency of other specified B group vitamins: Secondary | ICD-10-CM

## 2015-09-14 DIAGNOSIS — E785 Hyperlipidemia, unspecified: Secondary | ICD-10-CM

## 2015-09-14 DIAGNOSIS — I447 Left bundle-branch block, unspecified: Secondary | ICD-10-CM

## 2015-09-14 MED ORDER — CYANOCOBALAMIN 1000 MCG/ML IJ SOLN: 1000.0000 ug | INTRAMUSCULAR | Status: DC

## 2015-09-14 MED ORDER — CLOPIDOGREL BISULFATE 75 MG PO TABS: 75.0000 mg | ORAL_TABLET | Freq: Every day | ORAL | Status: DC

## 2015-09-14 MED ORDER — METOPROLOL SUCCINATE ER 100 MG PO TB24: 100.0000 mg | ORAL_TABLET | Freq: Every day | ORAL | Status: DC

## 2015-09-14 MED ORDER — RAMIPRIL 10 MG PO CAPS: 10.0000 mg | ORAL_CAPSULE | Freq: Every day | ORAL | Status: DC

## 2015-09-14 MED ORDER — GEMFIBROZIL 600 MG PO TABS: 600.0000 mg | ORAL_TABLET | Freq: Every day | ORAL | Status: DC

## 2015-09-14 MED ORDER — SIMVASTATIN 40 MG PO TABS: 40.0000 mg | ORAL_TABLET | Freq: Every evening | ORAL | Status: DC

## 2015-09-14 NOTE — Patient Instructions (Signed)
Today we updated your med list in our EPIC system...    Continue your current medications the same...    We refilled your meds per request...  Bring your lab sheet to the Urologist for your follow up in March...  Call for any questions...  Let's plan a follow up visit in 44mo, sooner if needed for problems.Marland KitchenMarland Kitchen

## 2015-09-14 NOTE — Progress Notes (Addendum)
Subjective:    Patient ID: [name], male    DOB: Jul 05, 1932, 82 y.o.   MRN: [id number]  HPI 82 y/o WM here for a follow up visit & review of mult medical problems... 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 ~  SEE PREV EPIC NOTES FOR OLDER DATA >>    2DEcho 10/11 showed EF=35-40% & mild HK  LABS 1/15:  FLP- ok on Simva40+Lopid600;  Chems- wnl;  CBC- ok w/ Hg=12.6;  TSH=3.78;  PSA=3.47...  LABS 6/15 & 7/15 showed> FLP- fair w/ LDL=104;  Chems- wnl;  CBC- ok x WBC=16.9 w/ 57%eos; f/u CBC 8/15 showed Hg=11.6, WBC=6.9 w/ 17%eos...  CXR 8/15 showed norm heart size, Pacer/ICD stable, stent noted, lungs clear, NAD...    ~  September 09, 2014:  65mo ROV & [name] was injured at work (fell pulling yarn out of a machine) & sustained fx rib, went to ER 08/14/14, note reviewed, XRay w/ left anterolat 6th rib fx, treated w/ rest/ heat/ Tramadol/ Tylenol/ etc... Despite being 82 y/o he says he's going to keep on going!     He remains on MetopER100 & Altace10; BP= 138/80 & he denies any current CP, palpit, SOB, edema...    Hx CAD, cardiomyop, chr sys CHF, AS & MR, AVB & defib> followed by Benay Spice & DrTaylor on ASA/ Plavix & he states stable...    On Simva40, Lopid600; FLP 1/16 showed TChol 148, TG 111, HDL 45, LDL 81    Labs show elev PSA= 5.27 & we will refer to Urology... We reviewed prob list, meds, xrays and labs> see below for updates >>    CXR 08/14/14 showed norm heart size, Pacer/AICD on left, clear lungs w/ mild peribronch thickening, mult old rib fxs on left, NAD...   LABS 1/16:  FLP looks good on Simva40/Lopid600; Chems- wnl;  CBC- wnl;  TSH=5.40;  PSA=5.27...   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 We reviewed the  following medical problems during today's office visit >>     HBP> on MetopER100AM, Altace10PM;  BP= 130/78 & he denies CP, palpit, dizzy, SOB, edema; states feeling well & energy is good...    CAD, Ischemic cardiomyop EF=35-40%, chr sys heart failure, AS & MR> on ASA/ Plavix; followed by Benay Spice & seen 7/16 (note reviewed); he's had prev MI & mult interventions, last 2DEcho 10/11 showed EF=35-40% & mild HK;  Repeat 2DEcho 7/16 showed decr LVF w/ EF=40-45%, AK of apex & septum, Gr1DD, paradox septal motion, AoV sclerosis &mild AI, mild MV leaflet thickening (no change in meds).    Hx Syncope, AV block, Implanted Defib> followed by DrTaylor (seen 2/16- note reviewed) w/ his StJude biventricICD working normally) w/ pacer checks every 2-71mo he says; generator changed 6/11; he denies CP/angina, dizzy/ syncope.     Chol> on Simva40, Lopid600, FishOil; FLP 1/16 shows TChol 148, TG 111, HDL 45, LDL 81... No changes made, we stressed diet...    GI- GERD, constip> he had LapChole 2006; hx chr constip & anal stenosis- had eval by DrPatterson; cannot find reference to prev colonoscopy in Epic or [facility] but he says neg colon 2005 by DrPatterson; Rectal exam showed  anal stenosis- dilated via anoscopy; he was treated w/ Linzess & Chronulac & AnusolHC and improved (now just using Miralax)...     GU- elev PSA> He saw DrHerrick & now follows w/ him for elev PSA- they are approaching the prob conservatively w/ PSA Q74mo & consider hormone therapy if it continues to rise...    DJD> he just uses Tylenol as needed...    VitB12 defic> on B12 shots monthly... We reviewed prob list, meds, xrays and labs> see below for updates >>   EKG 02/11/15 showed SBrady, rate50, AV sequential pacing...   2DEcho 7/16 showed decr LVF w/ EF=40-45%, AK of apex & septum, Gr1DD, paradox septal motion, AoV sclerosis &mild AI, mild MV leaflet thickening (no change in meds).  LABS 8/16:  Chems- wnl;  CBC- wnl;  TSH=4.07   ADDENDUM>  He saw KW  08/24/15 w/ sore throat & exam c/w candida/thrush- strep swab neg, treated w/ MMW & Diflucan, resolved...   ~  September 14, 2015:  56mo [name] reports doing well- still working 5d/wk for [employer];  We reviewed the following medical problems during today's office visit >>     HBP> on MetopER100AM, Altace10PM;  BP= 140/68 & he denies CP, palpit, dizzy, SOB, edema; states feeling well & energy is good...    CAD w/ MI & interventions, Ischemic cardiomyop EF=35-40%, chr sys heart failure, AS & MR> on ASA/ Plavix; followed by DrKatz/Taylor & seen 7/16 (note reviewed); he's had prev MI & mult interventions, 2DEcho 10/11 showed EF=35-40% & mild HK;  Repeat 2DEcho 7/16 showed decr LVF w/ EF=40-45%, AK of apex & septum, Gr1DD, paradox septal motion, AoV sclerosis &mild AI, mild MV leaflet thickening (no change in meds).    Hx Syncope, AV block, Implanted Defib> followed by DrTaylor (seen 2/16- note reviewed) w/ his StJude biventricICD working normally) w/ pacer checks every 2-4mo he says; generator changed 6/11; he denies CP/angina, dizzy/ syncope.     Chol> on Simva40, Lopid600, FishOil; FLP 2/17 shows TChol 148, TG 55, HDL 53, LDL 85... No changes made, we stressed diet...    GI- GERD, constip> he had LapChole 2006; hx chr constip & anal stenosis- had eval by DrPatterson; Colonoscopy 2005 by DrPatterson was neg/ wnl; Rectal exam showed anal stenosis- dilated via anoscopy; he was treated w/ Linzess & Chronulac & AnusolHC and improved (now just using Miralax)...     GU- elev PSA> He saw DrHerrick & now follows w/ him for elev PSA- they are approaching the prob conservatively w/ PSA Q3-41mo & consider Bx & hormone therapy if it continues to rise; last seen 12/16- PSA =9.45, note reviewed...    DJD> he just uses Tylenol as needed...    VitB12 defic> on B12 shots monthly... EXAM shows Afeb, VSS, O2sat=98% on RA;  HEENT- neg, mallampati1;  Chest- clear w/o w/r/r;  Heart- RR w/o gr1/6SEM w/o r/g;  Abd-  soft, neg;  Ext- +arthritic changes, w/o c/c/e;  Neuro- intact...  LABS 09/09/15> FLP- all parameters at goals;  Chem- wnl;  CBC- ok w/ Hg=12.0, MCV=94;  TSH-3.64;  PSA=12.82 & pt is encouraged to f/u w/ Urology- [place] ASAP... IMP/PLAN>>  [place] is clinically stable, still working 5d/wk, no new complaints or concerns;  He has HBP, CAD, cardiomyop w/ EF 40-45% & ICD in place- followed by DrTaylor;  He has a rising PSA followed by DrHerrick & given a copy of labs to take to Urology... Same meds/ ROV 37mo.Marland KitchenMarland Kitchen  Problem List:  HYPERTENSION (ICD-401.9) - controlled on TOPROL XL 100mg /d & ALTACE 10mg /d... takes meds regularly and tolerates well...  ~  7/12: BP= 120/68 and OK at home per pt; he gets rare HA which he believes is related to his BP; denies fatigue, visual changes, CP, palipit, dizziness, syncope, dyspnea, edema, etc... ~  CXR 9/12 showed dual lead pacer/ AICD, norm heart size, clear lungs, NAD... ~  2/13:  BP= 124/68 & he remains asymptomatic... ~  8/13:  BP= 122/58 & he denies CP, palpit, SOB, edema, etc... ~  2/14: on MetopER100, Altace10;  BP= 148/60 & he denies CP, palpit, dizzy, SOB, edema. ~  8/14: on MetopER100 in AM, Altace10 in PM;  BP= 120/70 & he denies CP, palpit, dizzy, SOB, edema. ~  2/15: on MetopER100 & Altace10; BP= 136/64 & he remains asymptomatic... ~  8/15: on MetopER100AM, Altace10PM;  BP= 100/60 & he denies CP, palpit, dizzy, SOB, edema; states feeling well & energy is good ~  CXR 8/15 showed norm heart size, Pacer/ICD stable, stent noted, lungs clear, NAD. ~  2/16:  He remains on MetopER100 & Altace10; BP= 138/80 & he denies any current CP, palpit, SOB, edema.  CAD (ICD-414.00) - on ASA 81mg /d & PLAVIX 75mg /d> followed by Benay Spice... ISCHEMIC CARDIOMYOPATHY (ICD-414.8) - he's had prev MI w/ mult caths/ interventions. CHRONIC SYSTOLIC HEART FAILURE (123456) AORTIC STENOSIS (ICD-424.1) > Mild by 2DEcho 10/11 MITRAL REGURGITATION (ICD-396.3) ~  last cath  5/08 showed norm Lmain, patent stent in prox LAD, 80-90% lesion in mid-LAD w/ PCI & stent placed, & 50% ostial stenosis of 1st diagonal branch of LAD; 40%mid & 70%distal CIRC; 50%prox 40%mid 40%distal RCA; Global HK w/EF=30%. ~  NuclearStressTest 4/09 showed infer scar, no ischemia, EF=40%. ~  2DEcho 10/11 showed mild LVH, decr LVF w/ EF= 35-40% & mild HK, gr1DD, mild AS & MR, mild LAdil. ~  EKG 10/12 showed pacer rhythm... 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  Hx of SYNCOPE (ICD-780.2) AV BLOCK, COMPLETE (ICD-426.0) AUTOMATIC IMPLANTABLE CARDIAC DEFIBRILLATOR SITU (ICD-V45.02) ~  Pacer/ ICD per DrTaylor w/ several adjustments in 2008... ~  5/11:  f/u DrTaylor w/ decision for elective generator change> done 6/11 & tol well. ~  He continues to f/u w/ DrTaylor every 6 months... ~  2/13: f/u DrTaylor doing satis, device working normally, no changes made & f/u 44yr... ~  2/14: Hx Syncope, AV block, Implanted Defib> followed by DrTaylor w/ pacer checks every 2-22mo he says; generator changed 6/11; he denies CP/angina, dizzy/ syncope, etc. ~  2/15: he had f/u DrTaylor> stable & doing satis...  HYPERCHOLESTEROLEMIA (ICD-272.0) - takes SIMVASTATIN 40mg /d, GEMFIBRIZOL 600mg /d, & FISH OIL Bid... ~  [place] 08/14/07 on Simva40 showed TChol 163, TG 94, HDL 34, LDL 111... rec incr Simvastatin to 80mg /d. ~  FLP 1/10 showed TChol 125, TG 67, HDL 32, LDL 80... rec- continue same. ~  FLP 1/11 on Simva80+Gemfib600 showed TChol 137, TG 99, HDL 39, LDL 78 ~  FLP  1/12 on  Simva40+Gemfib600 showed TChol 157, TG 139, HDL 38, LDL 92 ~  FLP 1/13 on Simva40+Gemfib600 showed TChol 149, TG 50, HDL 39, LDL 100 ~  FLP 1/14 on Simva40+Gemfib600 showed TChol 152, TG 74, HDL 38, LDL 99  ~  FLP 1/15 on Simva40+Gemfib600 showed TChol 159, TG 64, HDL 43, LDL 103 ~  FLP 6/15 on Simva40+Gemfib600 showed TChol 168, TG 117, HDL 41, LDL 104  ~  FLP 1/16 on Simva40+Gemfib600 showed TChol 148, TG 111, HDL 45, LDL 81  BORDERLINE TSH >> ~  Labs 1/15 showed TSH= 3.78 ~  Labs 1/16 showed an elev TSH for the 1st time w/ TSH= 5.40, he is clinically euthyroid.  GERD (ICD-530.81) - he had ERCP in 8/06 w/ norm ducts, +gallstones, s/p lap chole 2006... he uses OTC meds Prn... ~  he notes that he had a colonoscopy in the past from DrPatterson, & he reports that it was neg. ~  7/15> GI eval from DrPerry, he didn't cooment on the eosinophilia...  ELEVATED PSA >> ~  Labs 1/16 showed an elev PSA for the 1st time w/ PSA= 5.27  DEGENERATIVE JOINT DISEASE (ICD-715.90) - he tried Mobic but it bothered his stomach, therefore uses Tylenol Prn.  VITAMIN B12 DEFICIENCY (ICD-266.2) - old chart not available- ? details of this Dx... on B12 shots xyrs. ~  labs 1/09 showed Hg=12.9 ~  labs 1/10 showed Hg= 12.7 ~  labs 1/11 showed Hg= 12.6 ~  labs 1/12 showed Hg= 13.2 ~  Labs 1/13 showed Hg= 12.6 ~  Labs 1/14 showed Hg= 12.7 ~  Labs 1/15 showed Hg= 12.6, WBC= 5.7 w/ 10%eos ~  Labs 7/15 showed Hg= 11.7, WBC= 16.9 w/ 57%eos... ~  Labs 8/15 showed Hg= 11.6, WBC= 6.9, w/ 17%eos... ~  Labs 1/16 showed Hg= 12.7, WBC= 4.4 w/ 9% eos  Health Maintenance: ~  GI:  Followed by DrPatterson ~  GU:  PSA checked yearly & remains wnl... ~  Immunizations:  he gets the yearly Flu vaccine;  [place] he says;  given Pneumovax 1/11 age 59.   Past Surgical History  Procedure Laterality Date  . Icd implanted  2005  . Laparoscopic cholecystectomy  04/2005    Dr. Lucia Gaskins  . Cardiac  catheterization  2008    Outpatient Encounter Prescriptions as of 09/14/2015  Medication Sig  . aspirin 81 MG tablet Take 81 mg by mouth daily.    . clopidogrel (PLAVIX) 75 MG tablet Take 1 tablet (75 mg total) by mouth daily.  . cyanocobalamin (,VITAMIN B-12,) 1000 MCG/ML injection Inject 1 mL (1,000 mcg total) into the muscle every 30 (thirty) days.  . fish oil-omega-3 fatty acids 1000 MG capsule Take 2 g by mouth daily.    Marland Kitchen gemfibrozil (LOPID) 600 MG tablet Take 1 tablet (600 mg total) by mouth daily.  . metoprolol succinate (TOPROL-XL) 100 MG 24 hr tablet Take 1 tablet (100 mg total) by mouth daily. Take with or immediately following a meal.  . Multiple Vitamins-Minerals (MENS MULTIVITAMIN PLUS PO) Take 1 tablet by mouth daily.    . Polyethylene Glycol POWD Take 1 scoop by mouth daily.   . ramipril (ALTACE) 10 MG capsule Take 1 capsule (10 mg total) by mouth daily.  . simvastatin (ZOCOR) 40 MG tablet Take 1 tablet (40 mg total) by mouth every evening.  . [DISCONTINUED] clopidogrel (PLAVIX) 75 MG tablet Take 75 mg by mouth daily.  . [DISCONTINUED] cyanocobalamin (,  VITAMIN B-12,) 1000 MCG/ML injection Inject 1,000 mcg into the muscle every 30 (thirty) days.    . [DISCONTINUED] gemfibrozil (LOPID) 600 MG tablet Take 600 mg by mouth daily.  . [DISCONTINUED] magic mouthwash SOLN 44ml three times daily as needed for mouth/throat pain.  Swish and swallow.  . [DISCONTINUED] metoprolol succinate (TOPROL-XL) 100 MG 24 hr tablet Take 100 mg by mouth daily. Take with or immediately following a meal.  . [DISCONTINUED] ramipril (ALTACE) 10 MG capsule Take 10 mg by mouth daily.  . [DISCONTINUED] simvastatin (ZOCOR) 40 MG tablet Take 40 mg by mouth every evening.  . [DISCONTINUED] fluconazole (DIFLUCAN) 100 MG tablet 2 tabs by mouth today then 1 tab by mouth daily x 6 days   No facility-administered encounter medications on file as of 09/14/2015.    No Known Allergies   Current Medications, Allergies,  Past Medical History, Past Surgical History, Family History, and Social History were reviewed in [facility] record.    Review of Systems        See HPI - all other systems neg except as noted...  The patient complains of dyspnea on exertion.  The patient denies anorexia, fever, weight loss, weight gain, vision loss, decreased hearing, hoarseness, chest pain, syncope, peripheral edema, prolonged cough, headaches, hemoptysis, abdominal pain, melena, hematochezia, severe indigestion/heartburn, hematuria, incontinence, muscle weakness, suspicious skin lesions, transient blindness, difficulty walking, depression, unusual weight change, abnormal bleeding, enlarged lymph nodes, and angioedema.     Objective:   Physical Exam     WD, WN, 82 y/o WM in NAD... wt=170# ht=6'1" BMI=22... GENERAL:  Alert & oriented; pleasant & cooperative... HEENT:  [place]/AT, EOM-wnl, PERRLA, EACs-clear, TMs-wnl, NOSE-clear, THROAT-clear & wnl. NECK:  Supple w/ fairROM; no JVD; normal carotid impulses w/o bruits; no thyromegaly or nodules palpated; no lymphadenopathy. CHEST:  Clear to P & A; without wheezes/ rales/ or rhonchi heard... HEART:  Pacer in L upper chest area~ nontender, RR, gr 1/6 SEM without rubs or gallops detected... ABDOMEN:  Soft & nontender; normal bowel sounds; no organomegaly or masses palpated... EXT: without deformities, mild arthritic changes; no varicose veins/ venous insuffic/ or edema. NEURO:  CN's intact; no focal neuro deficits apprec... DERM:  No lesions noted; no rash etc...  RADIOLOGY DATA:  Reviewed in the EPIC EMR & discussed w/ the patient...   LABORATORY DATA:  Reviewed in the EPIC EMR & discussed w/ the patient...     Assessment & Plan:    HBP>  Controlled on Metoprolol & Ramipril, continue same...  CAD, Cardiomyopathy, CHF, ValvHeartDis w/ AS & MR>  Followed by DrKatz/ [name] & stable on ASA/ Plavix, still working 5d per week etc...  Hx syncope, AVBlock,  Pacer/AICD>  Followed by DrTaylor & stable, continue same...  LIPIDS>  FLP looks OK on Simva40, Gemfib 600mg /d, & Fish Oil...  GI symptoms 7/15>  Resolved w/ Colace & Miralax... He feels he is back to baseline...  GU> Elev PSA noted 1/16 & referred to Urology...  DJD>  He uses Tylenol as needed; still working regularly...  B-12 defic>  He remains on B12 shots monthly...   Patient's Medications  New Prescriptions   No medications on file  Previous Medications   ASPIRIN 81 MG TABLET    Take 81 mg by mouth daily.     FISH OIL-OMEGA-3 FATTY ACIDS 1000 MG CAPSULE    Take 2 g by mouth daily.     MULTIPLE VITAMINS-MINERALS (MENS MULTIVITAMIN PLUS PO)    Take 1 tablet  by mouth daily.     POLYETHYLENE GLYCOL POWD    Take 1 scoop by mouth daily.   Modified Medications   Modified Medication Previous Medication   CLOPIDOGREL (PLAVIX) 75 MG TABLET clopidogrel (PLAVIX) 75 MG tablet      Take 1 tablet (75 mg total) by mouth daily.    Take 75 mg by mouth daily.   CYANOCOBALAMIN (,VITAMIN B-12,) 1000 MCG/ML INJECTION cyanocobalamin (,VITAMIN B-12,) 1000 MCG/ML injection      Inject 1 mL (1,000 mcg total) into the muscle every 30 (thirty) days.    Inject 1,000 mcg into the muscle every 30 (thirty) days.     GEMFIBROZIL (LOPID) 600 MG TABLET gemfibrozil (LOPID) 600 MG tablet      Take 1 tablet (600 mg total) by mouth daily.    Take 600 mg by mouth daily.   METOPROLOL SUCCINATE (TOPROL-XL) 100 MG 24 HR TABLET metoprolol succinate (TOPROL-XL) 100 MG 24 hr tablet      Take 1 tablet (100 mg total) by mouth daily. Take with or immediately following a meal.    Take 100 mg by mouth daily. Take with or immediately following a meal.   RAMIPRIL (ALTACE) 10 MG CAPSULE ramipril (ALTACE) 10 MG capsule      Take 1 capsule (10 mg total) by mouth daily.    Take 10 mg by mouth daily.   SIMVASTATIN (ZOCOR) 40 MG TABLET simvastatin (ZOCOR) 40 MG tablet      Take 1 tablet (40 mg total) by mouth every evening.    Take 40  mg by mouth every evening.  Discontinued Medications   FLUCONAZOLE (DIFLUCAN) 100 MG TABLET    2 tabs by mouth today then 1 tab by mouth daily x 6 days   MAGIC MOUTHWASH SOLN    60ml three times daily as needed for mouth/throat pain.  Swish and swallow.

## 2015-09-19 ENCOUNTER — Ambulatory Visit (INDEPENDENT_AMBULATORY_CARE_PROVIDER_SITE_OTHER): Payer: BLUE CROSS/BLUE SHIELD

## 2015-09-19 DIAGNOSIS — E538 Deficiency of other specified B group vitamins: Secondary | ICD-10-CM | POA: Diagnosis not present

## 2015-09-20 MED ORDER — CYANOCOBALAMIN 1000 MCG/ML IJ SOLN
1000.0000 ug | Freq: Once | INTRAMUSCULAR | Status: AC
Start: 2015-09-19 — End: 2015-09-19
  Administered 2015-09-19: 1000 ug via INTRAMUSCULAR

## 2015-09-27 ENCOUNTER — Encounter: Payer: Self-pay | Admitting: Internal Medicine

## 2015-09-27 ENCOUNTER — Ambulatory Visit (INDEPENDENT_AMBULATORY_CARE_PROVIDER_SITE_OTHER): Payer: BLUE CROSS/BLUE SHIELD | Admitting: Internal Medicine

## 2015-09-27 VITALS — BP 130/78 | HR 50 | Ht 71.0 in | Wt 167.4 lb

## 2015-09-27 DIAGNOSIS — I5022 Chronic systolic (congestive) heart failure: Secondary | ICD-10-CM | POA: Diagnosis not present

## 2015-09-27 DIAGNOSIS — Z95810 Presence of automatic (implantable) cardiac defibrillator: Secondary | ICD-10-CM

## 2015-09-27 DIAGNOSIS — I2510 Atherosclerotic heart disease of native coronary artery without angina pectoris: Secondary | ICD-10-CM

## 2015-09-27 NOTE — Progress Notes (Signed)
HPI Mr. Cappo returns today for followup. He is a very pleasant 82 year old man with an ischemic cardiomyopathy, complete heart block, dyslipidemia, chronic systolic heart failure, status post biventricular ICD implantation, with no attempt to place left ventricular lead because of his lack of class II or 3 symptoms. In the interim, he has done well. He continues to work full time. He denies chest pain, shortness of breath, peripheral edema, syncope, and has had no ICD shocks.  No Known Allergies   Current Outpatient Prescriptions  Medication Sig Dispense Refill  . aspirin 81 MG tablet Take 81 mg by mouth daily.      . clopidogrel (PLAVIX) 75 MG tablet Take 1 tablet (75 mg total) by mouth daily. 90 tablet 3  . cyanocobalamin (,VITAMIN B-12,) 1000 MCG/ML injection Inject 1 mL (1,000 mcg total) into the muscle every 30 (thirty) days. 1 mL 5  . fish oil-omega-3 fatty acids 1000 MG capsule Take 2 g by mouth daily.      Marland Kitchen gemfibrozil (LOPID) 600 MG tablet Take 1 tablet (600 mg total) by mouth daily. 90 tablet 3  . metoprolol succinate (TOPROL-XL) 100 MG 24 hr tablet Take 1 tablet (100 mg total) by mouth daily. Take with or immediately following a meal. 90 tablet 3  . Multiple Vitamins-Minerals (MENS MULTIVITAMIN PLUS PO) Take 1 tablet by mouth daily.      . Polyethylene Glycol POWD Take 1 scoop by mouth daily.     . ramipril (ALTACE) 10 MG capsule Take 1 capsule (10 mg total) by mouth daily. 90 capsule 3  . simvastatin (ZOCOR) 40 MG tablet Take 1 tablet (40 mg total) by mouth every evening. 90 tablet 3   No current facility-administered medications for this visit.     Past Medical History  Diagnosis Date  . Hypertension   . CAD (coronary artery disease)     DES to LAD 2005 /  DES to LAD 2008  . LBBB (left bundle branch block)   . Cardiomyopathy     Ischemic, EF 30%, 2005  . Mitral regurgitation     Mild, echo, October, 2011  . Aortic stenosis     Mild, echo, October, 2011  . AV block,  complete (HCC)     Permanent pacemaker  . ICD (implantable cardiac defibrillator), single, in situ     for syncope and arrhythmia 2005 / generator change June, 2011  . Dyslipidemia   . GERD (gastroesophageal reflux disease)   . DJD (degenerative joint disease)   . Anemia, unspecified   . Vitamin B12 deficiency   . Ejection fraction < 50%     EF 30%, echo, 2005  /  EF 30%, catheterization, 2008  . Syncope     2005, ICD placed  . Systolic CHF, chronic ([place])   . Rectal fissure   . Hyperlipidemia     ROS:   All systems reviewed and negative except as noted in the HPI.   Past Surgical History  Procedure Laterality Date  . Icd implanted  2005  . Laparoscopic cholecystectomy  04/2005    Dr. Lucia Gaskins  . Cardiac catheterization  2008     Family History  Problem Relation Age of Onset  . Stroke Brother   . Lung cancer Brother   . Stroke Mother   . Cancer Mother 52  . Stroke Father 54     Social History   Social History  . Marital Status: Widowed    Spouse Name: N/A  . Number of Children:  2  . Years of Education: N/A   Occupational History  . repairs looms at [employer]    Social History Main Topics  . Smoking status: Former Smoker -- 1.00 packs/day for 2 years    Types: Cigarettes    Quit date: 08/06/1952  . Smokeless tobacco: Never Used  . Alcohol Use: No  . Drug Use: No  . Sexual Activity: Not on file   Other Topics Concern  . Not on file   Social History Narrative     BP 130/78 mmHg  Pulse 50  Ht 5\' 11"  (1.803 m)  Wt 167 lb 6.4 oz (75.932 kg)  BMI 23.36 kg/m2  SpO2 98%  Physical Exam:  Well appearing 82 year old man, NAD HEENT: Unremarkable Neck:  6 cm JVD, no thyromegally Lungs:  Clear with no wheezes, rales, or rhonchi. HEART:  Regular rate rhythm, grade 2/6 systolic murmurs, no rubs, no clicks Abd:  soft, positive bowel sounds, no organomegally, no rebound, no guarding Ext:  2 plus pulses, no edema, no cyanosis, no clubbing Skin:  No rashes  no nodules Neuro:  CN II through XII intact, motor grossly intact  DEVICE  Normal device function.  See PaceArt for details.   Assess/Plan:  1. Chronic systolic heart failure - his symptoms remain class 1. He will continue his current meds and maintain a low sodium diet. 2.ICD - his St. Jude ICD is working normally. Will recheck in several months.  3. CAD - he denies anginal symptoms. Will follow.  Mikle Bosworth.D.

## 2015-09-27 NOTE — Patient Instructions (Signed)
Medication Instructions:  Your physician recommends that you continue on your current medications as directed. Please refer to the Current Medication list given to you today.   Labwork: None ordered   Testing/Procedures: None ordered   Follow-Up: Your physician wants you to follow-up in: 12 months with Dr Knox Saliva will receive a reminder letter in the mail two months in advance. If you don't receive a letter, please call our office to schedule the follow-up appointment.  Remote monitoring is used to monitor your  ICD from home. This monitoring reduces the number of office visits required to check your device to one time per year. It allows [place] to keep an eye on the functioning of your device to ensure it is working properly. You are scheduled for a device check from home on 12/27/15. You may send your transmission at any time that day. If you have a wireless device, the transmission will be sent automatically. After your physician reviews your transmission, you will receive a postcard with your next transmission date.     Any Other Special Instructions Will Be Listed Below (If Applicable).     If you need a refill on your cardiac medications before your next appointment, please call your pharmacy.

## 2015-09-28 LAB — CUP PACEART INCLINIC DEVICE CHECK
Date Time Interrogation Session: 20170222075808
Implantable Lead Location: 753860
Implantable Lead Model: 1580
MDC IDC LEAD IMPLANT DT: 20050509
MDC IDC LEAD IMPLANT DT: 20050509
MDC IDC LEAD LOCATION: 753859
MDC IDC PG SERIAL: 610170

## 2015-10-17 ENCOUNTER — Ambulatory Visit (INDEPENDENT_AMBULATORY_CARE_PROVIDER_SITE_OTHER): Payer: BLUE CROSS/BLUE SHIELD

## 2015-10-17 DIAGNOSIS — E538 Deficiency of other specified B group vitamins: Secondary | ICD-10-CM

## 2015-10-18 MED ORDER — CYANOCOBALAMIN 1000 MCG/ML IJ SOLN
1000.0000 ug | Freq: Once | INTRAMUSCULAR | Status: AC
  Administered 2015-10-17: 1000 ug via INTRAMUSCULAR

## 2015-10-18 MED ORDER — CYANOCOBALAMIN 1000 MCG/ML IJ SOLN
1000.0000 ug | Freq: Once | INTRAMUSCULAR | Status: DC
  Administered 2015-10-17: 1000 ug via INTRAMUSCULAR

## 2015-10-21 DIAGNOSIS — R972 Elevated prostate specific antigen [PSA]: Secondary | ICD-10-CM | POA: Diagnosis not present

## 2015-11-17 ENCOUNTER — Ambulatory Visit (INDEPENDENT_AMBULATORY_CARE_PROVIDER_SITE_OTHER): Payer: BLUE CROSS/BLUE SHIELD

## 2015-11-17 DIAGNOSIS — E538 Deficiency of other specified B group vitamins: Secondary | ICD-10-CM | POA: Diagnosis not present

## 2015-11-29 MED ORDER — CYANOCOBALAMIN 1000 MCG/ML IJ SOLN
1000.0000 ug | Freq: Once | INTRAMUSCULAR | Status: AC
  Administered 2015-11-17: 1000 ug via INTRAMUSCULAR

## 2015-12-19 ENCOUNTER — Ambulatory Visit (INDEPENDENT_AMBULATORY_CARE_PROVIDER_SITE_OTHER): Payer: BLUE CROSS/BLUE SHIELD

## 2015-12-19 DIAGNOSIS — D649 Anemia, unspecified: Secondary | ICD-10-CM

## 2015-12-27 ENCOUNTER — Telehealth: Payer: Self-pay | Admitting: Cardiology

## 2015-12-27 ENCOUNTER — Ambulatory Visit (INDEPENDENT_AMBULATORY_CARE_PROVIDER_SITE_OTHER): Payer: BLUE CROSS/BLUE SHIELD | Admitting: *Deleted

## 2015-12-27 DIAGNOSIS — Z95810 Presence of automatic (implantable) cardiac defibrillator: Secondary | ICD-10-CM

## 2015-12-27 DIAGNOSIS — I429 Cardiomyopathy, unspecified: Secondary | ICD-10-CM

## 2015-12-27 NOTE — Telephone Encounter (Signed)
LMOVM reminding pt to send remote transmission.   

## 2015-12-28 NOTE — Progress Notes (Signed)
Remote ICD transmission.   

## 2016-01-06 MED ORDER — CYANOCOBALAMIN 1000 MCG/ML IJ SOLN
1000.0000 ug | Freq: Once | INTRAMUSCULAR | Status: AC
  Administered 2015-12-19: 1000 ug via INTRAMUSCULAR

## 2016-01-16 LAB — CUP PACEART REMOTE DEVICE CHECK
HighPow Impedance: 39 Ohm
Implantable Lead Implant Date: 20050509
Implantable Lead Location: 753860
Lead Channel Impedance Value: 440 Ohm
Lead Channel Sensing Intrinsic Amplitude: 1.7 mV
MDC IDC LEAD IMPLANT DT: 20050509
MDC IDC LEAD LOCATION: 753859
MDC IDC MSMT LEADCHNL RV IMPEDANCE VALUE: 310 Ohm
MDC IDC SESS DTM: 20170612122316
Pulse Gen Serial Number: 610170

## 2016-01-19 ENCOUNTER — Ambulatory Visit (INDEPENDENT_AMBULATORY_CARE_PROVIDER_SITE_OTHER): Payer: BLUE CROSS/BLUE SHIELD

## 2016-01-19 DIAGNOSIS — E538 Deficiency of other specified B group vitamins: Secondary | ICD-10-CM | POA: Diagnosis not present

## 2016-01-20 ENCOUNTER — Telehealth: Payer: Self-pay | Admitting: Pulmonary Disease

## 2016-01-20 MED ORDER — CYANOCOBALAMIN 1000 MCG/ML IJ SOLN
1000.0000 ug | Freq: Once | INTRAMUSCULAR | Status: AC
  Administered 2016-01-19: 1000 ug via INTRAMUSCULAR

## 2016-01-20 NOTE — Telephone Encounter (Signed)
Pt returning lindsays call

## 2016-01-20 NOTE — Telephone Encounter (Signed)
lmtcb x1 for pt. 

## 2016-01-20 NOTE — Telephone Encounter (Signed)
Spoke with pt, states he gets a 90 day supply of vit b12 from [facility].  Pt is unsure why he is having to get this mailed to him and does not wish to keep up with it at home.   Alroy Bailiff did you request this info from him?  Thanks!

## 2016-01-24 ENCOUNTER — Encounter: Payer: Self-pay | Admitting: Cardiology

## 2016-01-24 NOTE — Telephone Encounter (Signed)
Called [facility], they said they could not mail the B-12 to [place] they had to mail it to the pt.. Either his ins. Changed his rxs to mail order 90 day supply or it's a different ins. All together. Waiting for pt. To cb so I can make him aware of this info.Marland Kitchen

## 2016-01-24 NOTE — Telephone Encounter (Signed)
My bad I accidentally closed the encounter. That was 2 days and a weekend ago. I think maybe, I did call him. I spoke to [facility] about it and she said to get the info. I will call the pharm. And try to find out what is going on.

## 2016-01-24 NOTE — Telephone Encounter (Signed)
No

## 2016-01-25 NOTE — Telephone Encounter (Signed)
Pt's daughter called back. The only thing I can figure is the ins. Co. Changed to mail order pharm.. She is going to call them and see if he can start getting the shots from our office again.  Mrs. Carlis Abbott( daugh.) Called ins. Co., they told her to call [facility], she did and they told the dr.'s office would need to call them.([facility]) I called them and they told me the next time the pt. Orders his B-12 to have him give our address ea. Time, he has his rx refilled. I called Mrs.clark back and explained to her what they told me and gave her our address. Nothing further needed. Closing.

## 2016-01-27 ENCOUNTER — Other Ambulatory Visit: Payer: Self-pay | Admitting: Pulmonary Disease

## 2016-01-27 DIAGNOSIS — I6523 Occlusion and stenosis of bilateral carotid arteries: Secondary | ICD-10-CM

## 2016-02-01 ENCOUNTER — Ambulatory Visit (HOSPITAL_COMMUNITY)
Admission: RE | Admit: 2016-02-01 | Discharge: 2016-02-01 | Disposition: A | Discharge status: Home / Self Care | Payer: BLUE CROSS/BLUE SHIELD | Source: Ambulatory Visit | Attending: Cardiovascular Disease | Admitting: Cardiovascular Disease

## 2016-02-01 DIAGNOSIS — I110 Hypertensive heart disease with heart failure: Secondary | ICD-10-CM | POA: Insufficient documentation

## 2016-02-01 DIAGNOSIS — I5022 Chronic systolic (congestive) heart failure: Secondary | ICD-10-CM | POA: Diagnosis not present

## 2016-02-01 DIAGNOSIS — I6523 Occlusion and stenosis of bilateral carotid arteries: Secondary | ICD-10-CM | POA: Insufficient documentation

## 2016-02-01 DIAGNOSIS — I2510 Atherosclerotic heart disease of native coronary artery without angina pectoris: Secondary | ICD-10-CM | POA: Insufficient documentation

## 2016-02-01 DIAGNOSIS — E785 Hyperlipidemia, unspecified: Secondary | ICD-10-CM | POA: Diagnosis not present

## 2016-02-01 DIAGNOSIS — I447 Left bundle-branch block, unspecified: Secondary | ICD-10-CM | POA: Insufficient documentation

## 2016-02-01 DIAGNOSIS — K219 Gastro-esophageal reflux disease without esophagitis: Secondary | ICD-10-CM | POA: Diagnosis not present

## 2016-02-06 ENCOUNTER — Telehealth: Payer: Self-pay | Admitting: Pulmonary Disease

## 2016-02-06 NOTE — Telephone Encounter (Signed)
Pt daughter made aware of results. Nothing further needed

## 2016-02-20 ENCOUNTER — Ambulatory Visit (INDEPENDENT_AMBULATORY_CARE_PROVIDER_SITE_OTHER): Payer: BLUE CROSS/BLUE SHIELD

## 2016-02-20 DIAGNOSIS — E538 Deficiency of other specified B group vitamins: Secondary | ICD-10-CM

## 2016-02-21 ENCOUNTER — Encounter: Payer: Self-pay | Admitting: *Deleted

## 2016-02-21 MED ORDER — CYANOCOBALAMIN 1000 MCG/ML IJ SOLN
1000.0000 ug | Freq: Once | INTRAMUSCULAR | Status: AC
  Administered 2016-02-20: 1000 ug via INTRAMUSCULAR

## 2016-03-05 ENCOUNTER — Telehealth: Payer: Self-pay | Admitting: Pulmonary Disease

## 2016-03-05 DIAGNOSIS — R972 Elevated prostate specific antigen [PSA]: Secondary | ICD-10-CM

## 2016-03-05 DIAGNOSIS — I10 Essential (primary) hypertension: Secondary | ICD-10-CM

## 2016-03-05 DIAGNOSIS — I340 Nonrheumatic mitral (valve) insufficiency: Secondary | ICD-10-CM

## 2016-03-05 NOTE — Telephone Encounter (Signed)
Pt had labs last on 09/09/15  Does he need any for upcoming appt 03/13/16? If so, please advise and we can order, thanks!

## 2016-03-06 NOTE — Telephone Encounter (Signed)
Called and lmom to make the pt aware of labs that are in the computer and that he can come in for these when he is able.  Nothing further is needed.

## 2016-03-08 ENCOUNTER — Ambulatory Visit (INDEPENDENT_AMBULATORY_CARE_PROVIDER_SITE_OTHER): Payer: BLUE CROSS/BLUE SHIELD | Admitting: Cardiovascular Disease

## 2016-03-08 ENCOUNTER — Encounter: Payer: Self-pay | Admitting: Cardiovascular Disease

## 2016-03-08 VITALS — BP 120/60 | HR 50 | Ht 72.0 in | Wt 166.0 lb

## 2016-03-08 DIAGNOSIS — I255 Ischemic cardiomyopathy: Secondary | ICD-10-CM

## 2016-03-08 DIAGNOSIS — I5022 Chronic systolic (congestive) heart failure: Secondary | ICD-10-CM

## 2016-03-08 DIAGNOSIS — I10 Essential (primary) hypertension: Secondary | ICD-10-CM | POA: Diagnosis not present

## 2016-03-08 DIAGNOSIS — I2510 Atherosclerotic heart disease of native coronary artery without angina pectoris: Secondary | ICD-10-CM

## 2016-03-08 NOTE — Patient Instructions (Signed)

## 2016-03-08 NOTE — Progress Notes (Signed)
Chief Complaint  Patient presents with  . Follow-up    Annual    History of Present Illness: 82 yo male with history of complete heart block, CAD s/p stenting of the LAD in 2005 and 2008, ischemic cardiomyopathy s/p ICD ( implanted 2005/generator change 2011), HLD, HTN, chronic systolic CHF here today for cardiac follow up. He has been followed by Dr. Ron Parker and I am meeting him for the first time today. His ICD is followed by Dr. [name]. Echo July 2016 with LVEF=40-45%, mild AI, trivial MR.   He tells me today that he feels well. He has no chest pain or dyspnea. He is very active. He still works at [employer]. He works in the garden.   Primary Care Physician: Noralee Space, MD   Past Medical History:  Diagnosis Date  . Anemia, unspecified   . Aortic stenosis    Mild, echo, October, 2011  . AV block, complete (HCC)    Permanent pacemaker  . CAD (coronary artery disease)    DES to LAD 2005 /  DES to LAD 2008  . Cardiomyopathy    Ischemic, EF 30%, 2005  . DJD (degenerative joint disease)   . Dyslipidemia   . Ejection fraction < 50%    EF 30%, echo, 2005  /  EF 30%, catheterization, 2008  . GERD (gastroesophageal reflux disease)   . Hyperlipidemia   . Hypertension   . ICD (implantable cardiac defibrillator), single, in situ    for syncope and arrhythmia 2005 / generator change June, 2011  . LBBB (left bundle branch block)   . Mitral regurgitation    Mild, echo, October, 2011  . Rectal fissure   . Syncope    2005, ICD placed  . Systolic CHF, chronic ([facility])   . Vitamin B12 deficiency     Past Surgical History:  Procedure Laterality Date  . CARDIAC CATHETERIZATION  2008  . icd implanted  2005  . LAPAROSCOPIC CHOLECYSTECTOMY  04/2005   Dr. Lucia Gaskins  . ROTATOR CUFF REPAIR      Current Outpatient Prescriptions  Medication Sig Dispense Refill  . aspirin 81 MG tablet Take 81 mg by mouth daily.      . clopidogrel (PLAVIX) 75 MG tablet Take 1 tablet (75 mg total) by mouth  daily. 90 tablet 3  . cyanocobalamin (,VITAMIN B-12,) 1000 MCG/ML injection Inject 1 mL (1,000 mcg total) into the muscle every 30 (thirty) days. 1 mL 5  . fish oil-omega-3 fatty acids 1000 MG capsule Take 2 g by mouth daily.      Marland Kitchen gemfibrozil (LOPID) 600 MG tablet Take 1 tablet (600 mg total) by mouth daily. 90 tablet 3  . metoprolol succinate (TOPROL-XL) 100 MG 24 hr tablet Take 1 tablet (100 mg total) by mouth daily. Take with or immediately following a meal. 90 tablet 3  . Multiple Vitamins-Minerals (MENS MULTIVITAMIN PLUS PO) Take 1 tablet by mouth daily.      . Polyethylene Glycol POWD Take 1 scoop by mouth daily.     . ramipril (ALTACE) 10 MG capsule Take 1 capsule (10 mg total) by mouth daily. 90 capsule 3  . simvastatin (ZOCOR) 40 MG tablet Take 1 tablet (40 mg total) by mouth every evening. 90 tablet 3   No current facility-administered medications for this visit.     No Known Allergies  Social History   Social History  . Marital status: Widowed    Spouse name: N/A  . Number of children: 2  .  Years of education: N/A   Occupational History  . repairs looms at [employer]    Social History Main Topics  . Smoking status: Former Smoker    Packs/day: 1.00    Years: 2.00    Types: Cigarettes    Quit date: 08/06/1952  . Smokeless tobacco: Never Used  . Alcohol use No  . Drug use: No  . Sexual activity: Not on file   Other Topics Concern  . Not on file   Social History Narrative  . No narrative on file    Family History  Problem Relation Age of Onset  . Stroke Mother   . Cancer Mother 39  . Heart attack Father 34  . Stroke Brother   . Lung cancer Brother     Review of Systems:  As stated in the HPI and otherwise negative.   BP 120/60   Pulse (!) 50   Ht 6' (1.829 m)   Wt 166 lb (75.3 kg)   BMI 22.51 kg/m   Physical Examination: General: Well developed, well nourished, NAD  HEENT: OP clear, mucus membranes moist  SKIN: warm, dry. No rashes. Neuro: No  focal deficits  Musculoskeletal: Muscle strength 5/5 all ext  Psychiatric: Mood and affect normal  Neck: No JVD, no carotid bruits, no thyromegaly, no lymphadenopathy.  Lungs:Clear bilaterally, no wheezes, rhonci, crackles Cardiovascular: Regular rate and rhythm. No murmurs, gallops or rubs. Abdomen:Soft. Bowel sounds present. Non-tender.  Extremities: No lower extremity edema. Pulses are 2 + in the bilateral DP/PT.  Echo July 2016: Left ventricle: The cavity size was normal. Systolic function was   mildly to moderately reduced. The estimated ejection fraction was   in the range of 40% to 45%. Endocardial segments are not well   visulaized. There is akinesis of the apical septal myocardium.   There is akinesis of the midinferoseptal myocardium. There is   akinesis of the entire apical myocardium. Recommend limited echo   with definity contrast to adequately evaluate for wall motion   abnormalities. There was an increased relative contribution of   atrial contraction to ventricular filling. Doppler parameters are   consistent with abnormal left ventricular relaxation (grade 1   diastolic dysfunction). - Ventricular septum: Septal motion showed paradox. - Aortic valve: Severely focal calcification of the aortic vavle   annulus. Trileaflet. Mild thickening and calcification,   consistent with sclerosis. There was mild regurgitation. - Mitral valve: Calcified annulus. Mildly thickened leaflets . - Pulmonic valve: There was trivial regurgitation.  EKG:  EKG is ordered today. The ekg ordered today demonstrates paced rhythm.   Recent Labs: 09/09/2015: ALT 15; BUN 17; Creatinine, Ser 0.95; Hemoglobin 12.0; Platelets 209.0; Potassium 5.0; Sodium 143; TSH 3.64   Lipid Panel    Component Value Date/Time   CHOL 148 09/09/2015 0800   TRIG 55.0 09/09/2015 0800   HDL 52.60 09/09/2015 0800   CHOLHDL 3 09/09/2015 0800   VLDL 11.0 09/09/2015 0800   LDLCALC 85 09/09/2015 0800   LDLDIRECT 96.3  02/26/2011 0735     Wt Readings from Last 3 Encounters:  03/08/16 166 lb (75.3 kg)  09/27/15 167 lb 6.4 oz (75.9 kg)  09/14/15 166 lb 9.6 oz (75.6 kg)     Other studies Reviewed: Additional studies/ records that were reviewed today include: . Review of the above records demonstrates:   Assessment and Plan:   1. CAD: He has no chest pain concerning for angina. Will continue ASA, Plavix, statin and beta blocker.   2. Ischemic  cardiomyopathy: LVEF=40-45% by echo July 2016. Continue medical therapy with beta blocker and ace-inh. ICD in place.   3. Chronic systolic CHF: His weight is stable. He is not on diuretics.   4. HTN: BP controlled. No changes.   5. HLD: Lipids followed in primary care. He is on a statin. LDL near goal.   Current medicines are reviewed at length with the patient today.  The patient does not have concerns regarding medicines.  The following changes have been made:  no change  Labs/ tests ordered today include:   Orders Placed This Encounter  Procedures  . EKG 12-Lead    Disposition:   FU with me in 12 months  Signed, Lauree Chandler, MD 03/08/2016 4:51 PM    [facility] [address], [place], [place]  [address] Phone: [phone]; Fax: [phone]

## 2016-03-09 ENCOUNTER — Other Ambulatory Visit (INDEPENDENT_AMBULATORY_CARE_PROVIDER_SITE_OTHER): Payer: BLUE CROSS/BLUE SHIELD

## 2016-03-09 DIAGNOSIS — I340 Nonrheumatic mitral (valve) insufficiency: Secondary | ICD-10-CM

## 2016-03-09 DIAGNOSIS — R972 Elevated prostate specific antigen [PSA]: Secondary | ICD-10-CM

## 2016-03-09 DIAGNOSIS — H524 Presbyopia: Secondary | ICD-10-CM | POA: Diagnosis not present

## 2016-03-09 DIAGNOSIS — I10 Essential (primary) hypertension: Secondary | ICD-10-CM

## 2016-03-09 DIAGNOSIS — H2513 Age-related nuclear cataract, bilateral: Secondary | ICD-10-CM | POA: Diagnosis not present

## 2016-03-09 DIAGNOSIS — H5203 Hypermetropia, bilateral: Secondary | ICD-10-CM | POA: Diagnosis not present

## 2016-03-09 LAB — CBC WITH DIFFERENTIAL/PLATELET
Basophils Absolute: 0 10*3/uL (ref 0.0–0.1)
Basophils Relative: 0.6 % (ref 0.0–3.0)
EOS ABS: 0.2 10*3/uL (ref 0.0–0.7)
Eosinophils Relative: 5.9 % — ABNORMAL HIGH (ref 0.0–5.0)
HCT: 30.2 % — ABNORMAL LOW (ref 39.0–52.0)
HEMOGLOBIN: 10.4 g/dL — AB (ref 13.0–17.0)
Lymphocytes Relative: 25 % (ref 12.0–46.0)
Lymphs Abs: 1 10*3/uL (ref 0.7–4.0)
MCHC: 34.6 g/dL (ref 30.0–36.0)
MCV: 93.2 fl (ref 78.0–100.0)
MONO ABS: 0.4 10*3/uL (ref 0.1–1.0)
Monocytes Relative: 10.8 % (ref 3.0–12.0)
Neutro Abs: 2.3 10*3/uL (ref 1.4–7.7)
Neutrophils Relative %: 57.7 % (ref 43.0–77.0)
Platelets: 177 10*3/uL (ref 150.0–400.0)
RBC: 3.24 Mil/uL — AB (ref 4.22–5.81)
RDW: 13.5 % (ref 11.5–15.5)
WBC: 4 10*3/uL (ref 4.0–10.5)

## 2016-03-09 LAB — BASIC METABOLIC PANEL
BUN: 20 mg/dL (ref 6–23)
CO2: 28 meq/L (ref 19–32)
Calcium: 9.2 mg/dL (ref 8.4–10.5)
Chloride: 110 mEq/L (ref 96–112)
Creatinine, Ser: 0.95 mg/dL (ref 0.40–1.50)
GFR: 80.73 mL/min (ref >60.00)
GLUCOSE: 96 mg/dL (ref 70–99)
POTASSIUM: 4.2 meq/L (ref 3.5–5.1)
SODIUM: 144 meq/L (ref 135–145)

## 2016-03-09 LAB — PSA: PSA: 12.25 ng/mL — AB (ref 0.10–4.00)

## 2016-03-13 ENCOUNTER — Encounter: Payer: Self-pay | Admitting: Pulmonary Disease

## 2016-03-13 ENCOUNTER — Other Ambulatory Visit (INDEPENDENT_AMBULATORY_CARE_PROVIDER_SITE_OTHER): Payer: BLUE CROSS/BLUE SHIELD

## 2016-03-13 ENCOUNTER — Ambulatory Visit (INDEPENDENT_AMBULATORY_CARE_PROVIDER_SITE_OTHER): Payer: BLUE CROSS/BLUE SHIELD | Admitting: Pulmonary Disease

## 2016-03-13 VITALS — BP 114/68 | HR 50 | Temp 97.4°F | Ht 71.0 in | Wt 166.4 lb

## 2016-03-13 DIAGNOSIS — E785 Hyperlipidemia, unspecified: Secondary | ICD-10-CM

## 2016-03-13 DIAGNOSIS — I10 Essential (primary) hypertension: Secondary | ICD-10-CM | POA: Diagnosis not present

## 2016-03-13 DIAGNOSIS — I5022 Chronic systolic (congestive) heart failure: Secondary | ICD-10-CM

## 2016-03-13 DIAGNOSIS — I2510 Atherosclerotic heart disease of native coronary artery without angina pectoris: Secondary | ICD-10-CM

## 2016-03-13 DIAGNOSIS — Z95810 Presence of automatic (implantable) cardiac defibrillator: Secondary | ICD-10-CM

## 2016-03-13 DIAGNOSIS — M150 Primary generalized (osteo)arthritis: Secondary | ICD-10-CM

## 2016-03-13 DIAGNOSIS — D649 Anemia, unspecified: Secondary | ICD-10-CM | POA: Diagnosis not present

## 2016-03-13 DIAGNOSIS — I429 Cardiomyopathy, unspecified: Secondary | ICD-10-CM

## 2016-03-13 DIAGNOSIS — R972 Elevated prostate specific antigen [PSA]: Secondary | ICD-10-CM

## 2016-03-13 DIAGNOSIS — I350 Nonrheumatic aortic (valve) stenosis: Secondary | ICD-10-CM

## 2016-03-13 DIAGNOSIS — M159 Polyosteoarthritis, unspecified: Secondary | ICD-10-CM

## 2016-03-13 DIAGNOSIS — I255 Ischemic cardiomyopathy: Secondary | ICD-10-CM

## 2016-03-13 DIAGNOSIS — E538 Deficiency of other specified B group vitamins: Secondary | ICD-10-CM | POA: Diagnosis not present

## 2016-03-13 DIAGNOSIS — I447 Left bundle-branch block, unspecified: Secondary | ICD-10-CM

## 2016-03-13 LAB — VITAMIN B12: Vitamin B-12: 329 pg/mL (ref 211–911)

## 2016-03-13 LAB — CBC WITH DIFFERENTIAL/PLATELET
Basophils Absolute: 0 10*3/uL (ref 0.0–0.1)
Basophils Relative: 0.3 % (ref 0.0–3.0)
EOS PCT: 4.9 % (ref 0.0–5.0)
Eosinophils Absolute: 0.3 10*3/uL (ref 0.0–0.7)
HCT: 35.8 % — ABNORMAL LOW (ref 39.0–52.0)
Hemoglobin: 12.3 g/dL — ABNORMAL LOW (ref 13.0–17.0)
LYMPHS ABS: 1.2 10*3/uL (ref 0.7–4.0)
Lymphocytes Relative: 22.9 % (ref 12.0–46.0)
MCHC: 34.3 g/dL (ref 30.0–36.0)
MCV: 93.5 fl (ref 78.0–100.0)
MONOS PCT: 10.6 % (ref 3.0–12.0)
Monocytes Absolute: 0.5 10*3/uL (ref 0.1–1.0)
NEUTROS ABS: 3.1 10*3/uL (ref 1.4–7.7)
NEUTROS PCT: 61.3 % (ref 43.0–77.0)
PLATELETS: 205 10*3/uL (ref 150.0–400.0)
RBC: 3.83 Mil/uL — AB (ref 4.22–5.81)
RDW: 13.6 % (ref 11.5–15.5)
WBC: 5.1 10*3/uL (ref 4.0–10.5)

## 2016-03-13 LAB — IBC PANEL
Iron: 116 ug/dL (ref 42–165)
Saturation Ratios: 28.8 % (ref 20.0–50.0)
Transferrin: 288 mg/dL (ref 212.0–360.0)

## 2016-03-13 NOTE — Progress Notes (Signed)
Subjective:    Patient ID: [name], male    DOB: 1931/09/18, 82 y.o.   MRN: [id number]  HPI 82 y/o WM here for a follow up visit & review of mult medical problems... 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 ~  SEE PREV EPIC NOTES FOR OLDER DATA >>    2DEcho 10/11 showed EF=35-40% & mild HK  LABS 1/15:  FLP- ok on Simva40+Lopid600;  Chems- wnl;  CBC- ok w/ Hg=12.6;  TSH=3.78;  PSA=3.47...  LABS 6/15 & 7/15 showed> FLP- fair w/ LDL=104;  Chems- wnl;  CBC- ok x WBC=16.9 w/ 57%eos; f/u CBC 8/15 showed Hg=11.6, WBC=6.9 w/ 17%eos...  CXR 8/15 showed norm heart size, Pacer/ICD stable, stent noted, lungs clear, NAD...   ~   [id number]:  [name] was injured at work (fell pulling yarn out of a machine) & sustained fx rib, went to ER 08/14/14, note reviewed, XRay w/ left anterolat 6th rib fx, treated w/ rest/ heat/ Tramadol/ Tylenol/ etc...     CXR 08/14/14 showed norm heart size, Pacer/AICD on left, clear lungs w/ mild peribronch thickening, mult old rib fxs on left, NAD...   LABS 1/16:  FLP looks good on Simva40/Lopid600; Chems- wnl;  CBC- wnl;  TSH=5.40;  PSA=5.27...   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 We reviewed the following medical problems during today's office visit >>     HBP> on MetopER100AM, Altace10PM;  BP= 130/78 & he denies CP, palpit, dizzy, SOB, edema; states feeling well & energy is good...    CAD, Ischemic cardiomyop EF=35-40%, chr sys heart failure, AS & MR> on ASA/ Plavix; followed by Benay Spice & seen 7/16 (note reviewed); he's had prev MI & mult interventions, last 2DEcho 10/11 showed EF=35-40% & mild HK;  Repeat 2DEcho 7/16 showed decr LVF w/ EF=40-45%, AK of apex & septum, Gr1DD, paradox septal motion,  AoV sclerosis &mild AI, mild MV leaflet thickening (no change in meds).    Hx Syncope, AV block, Implanted Defib> followed by DrTaylor (seen 2/16- note reviewed) w/ his StJude biventricICD working normally) w/ pacer checks every 2-61mo he says; generator changed 6/11; he denies CP/angina, dizzy/ syncope.     Chol> on Simva40, Lopid600, FishOil; FLP 1/16 shows TChol 148, TG 111, HDL 45, LDL 81... No changes made, we stressed diet...    GI- GERD, constip> he had LapChole 2006; hx chr constip & anal stenosis- had eval by DrPatterson; cannot find reference to prev colonoscopy in Epic or [facility] but he says neg colon 2005 by DrPatterson; Rectal exam showed anal stenosis- dilated via anoscopy; he was treated w/ Linzess & Chronulac & AnusolHC and improved (now just using Miralax)...     GU- elev PSA> He saw DrHerrick & now follows w/ him for elev PSA- they are approaching the prob conservatively w/ PSA Q14mo & consider hormone therapy if it continues to rise...    DJD> he just uses Tylenol as needed...    VitB12 defic> on B12 shots monthly... We reviewed prob list, meds, xrays and labs> see below for updates >>   EKG 02/11/15 showed SBrady, rate50, AV sequential pacing...   2DEcho 7/16 showed decr LVF w/ EF=40-45%, AK  of apex & septum, Gr1DD, paradox septal motion, AoV sclerosis &mild AI, mild MV leaflet thickening (no change in meds).  LABS 8/16:  Chems- wnl;  CBC- wnl;  TSH=4.07   ADDENDUM>  He saw KW 08/24/15 w/ sore throat & exam c/w candida/thrush- strep swab neg, treated w/ MMW & Diflucan, resolved...   ~  September 14, 2015:  67mo [place] reports doing well- still working 5d/wk for [employer];  We reviewed the following medical problems during today's office visit >>     HBP> on MetopER100AM, Altace10PM;  BP= 140/68 & he denies CP, palpit, dizzy, SOB, edema; states feeling well & energy is good...    CAD w/ MI & interventions, Ischemic cardiomyop EF=35-40%, chr sys heart failure, AS &  MR> on ASA/ Plavix; followed by DrKatz/Taylor & seen 7/16 (note reviewed); he's had prev MI & mult interventions, 2DEcho 10/11 showed EF=35-40% & mild HK;  Repeat 2DEcho 7/16 showed decr LVF w/ EF=40-45%, AK of apex & septum, Gr1DD, paradox septal motion, AoV sclerosis &mild AI, mild MV leaflet thickening (no change in meds).    Hx Syncope, AV block, Implanted Defib> followed by DrTaylor (seen 2/16- note reviewed) w/ his StJude biventricICD working normally) w/ pacer checks every 2-73mo he says; generator changed 6/11; he denies CP/angina, dizzy/ syncope.     Chol> on Simva40, Lopid600, FishOil; FLP 2/17 shows TChol 148, TG 55, HDL 53, LDL 85... No changes made, we stressed diet...    GI- GERD, constip> he had LapChole 2006; hx chr constip & anal stenosis- had eval by DrPatterson; Colonoscopy 2005 by DrPatterson was neg/ wnl; Rectal exam showed anal stenosis- dilated via anoscopy; he was treated w/ Linzess & Chronulac & AnusolHC and improved (now just using Miralax)...     GU- elev PSA> He saw DrHerrick & now follows w/ him for elev PSA- they are approaching the prob conservatively w/ PSA Q3-52mo & consider Bx & hormone therapy if it continues to rise; last seen 12/16- PSA =9.45, note reviewed...    DJD> he just uses Tylenol as needed...    VitB12 defic> on B12 shots monthly... EXAM shows Afeb, VSS, O2sat=98% on RA;  HEENT- neg, mallampati1;  Chest- clear w/o w/r/r;  Heart- RR w/o gr1/6SEM w/o r/g;  Abd- soft, neg;  Ext- +arthritic changes, w/o c/c/e;  Neuro- intact...  LABS 09/09/15> FLP- all parameters at goals;  Chem- wnl;  CBC- ok w/ Hg=12.0, MCV=94;  TSH-3.64;  PSA=12.82 & pt is encouraged to f/u w/ Urology- [facility] ASAP... IMP/PLAN>>  [name] is clinically stable, still working 5d/wk, no new complaints or concerns;  He has HBP, CAD, cardiomyop w/ EF 40-45% & ICD in place- followed by DrTaylor;  He has a rising PSA followed by DrHerrick & given a copy of labs to take to Urology... Same meds/ ROV  18mo...  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  We reviewed the following medical problems during today's office visit >>     HBP> on MetopER100AM, Altace10PM;  BP= 114/68 & he denies CP, palpit, dizzy, SOB, edema; states feeling well & energy is good...    CAD w/ MI & interventions, Ischemic cardiomyop decr EF, chr sys heart failure, AS & MR> on ASA/ Plavix; followed by DrMcAlhany/Taylor & seen 8/17 (as above); he's had prev MI & mult interventions, 2DEcho 10/11 showed EF=35-40% & mild HK;  Repeat 2DEcho 7/16 showed decr LVF w/ EF=40-45%, AK of apex & septum, Gr1DD, paradox septal motion, AoV sclerosis &mild AI, mild MV leaflet thickening; Stable- no change in meds.    Hx Syncope, AV block, Implanted Defib> followed by DrTaylor (seen 2/17- note reviewed) w/ his StJude biventricICD working normally) w/ pacer checks every 2-85mo he says; generator changed 6/11; he denies CP/angina, dizzy/ syncope, etc...    Chol> on Simva40, Lopid600, FishOil; FLP 2/17 shows TChol 148, TG 55, HDL 53, LDL 85... No changes made, we stressed diet...    GI- GERD, constip> he had LapChole 2006; hx chr constip & anal stenosis- had eval by DrPatterson; Colonoscopy 2005 by DrPatterson was neg/ wnl; Rectal exam showed anal stenosis- dilated via anoscopy; he was treated w/ Linzess & Chronulac & AnusolHC and improved (now just using Miralax)...      GU- elev PSA> He sees DrHerrick for elev PSA- they are approaching this conservatively w/ PSA Q3-15mo & consider Bx & hormone therapy if it continues to rise; PSA 09/2015 was 12.8 & pt declined Bx (didn't want to consider XRT), they discussed options & are continuing to monitor (see note of 10/21/15- reviewed)... PSA 8/17= 12.25    DJD> he just uses Tylenol as needed...    VitB12 defic> on B12 shots monthly... EXAM shows Afeb, VSS, O2sat=98% on RA;  HEENT- neg, mallampati1;  Chest- clear w/o w/r/r;  Heart- RR w/o gr1/6SEM w/o r/g;  Abd- soft, neg;  Ext- +arthritic changes, w/o c/c/e;  Neuro- intact...  LABS 03/2016>  Chems- wnl;  CBC- new mild anemia w/ Hg=10.4, mcv=93;  Repeat CBC showed Hg=12.3,  Fe=116 (29%sat),  B12=329,  SPE/IEP- no monoclonal prot;  stool card was pos for hidden blood... IMP/PLAN>>  Tishon has mulitsys disease but is still going strong- CAD, cardiomyop, ICD etc; plus prostate cancer on watchful waiting from [place]; new prob is mild anemia, Fe/ B12/ SPE are ok but stool pos for occult blood; he has hx chr constip & anal stenosis, on Miralax, prev followed by DrPatterson- needs GI eval for +stool & we will refer...            Problem List:  HYPERTENSION (ICD-401.9) - controlled on TOPROL XL 100mg /d & ALTACE 10mg /d... takes meds regularly and tolerates well...  ~  7/12: BP= 120/68 and OK at home per pt; he gets rare HA which he believes is related to his BP; denies fatigue, visual changes, CP, palipit, dizziness, syncope, dyspnea, edema, etc... ~  CXR 9/12 showed dual lead pacer/ AICD, norm heart size, clear lungs, NAD... ~  2/13:  BP= 124/68 & he remains asymptomatic... ~  8/13:  BP= 122/58 & he denies CP, palpit, SOB, edema, etc... ~  2/14: on MetopER100, Altace10;  BP= 148/60 & he denies CP, palpit, dizzy, SOB, edema. ~  8/14: on MetopER100 in AM, Altace10 in PM;  BP= 120/70 & he denies CP, palpit, dizzy, SOB, edema. ~  2/15: on MetopER100 &  Altace10; BP= 136/64 & he  remains asymptomatic... ~  8/15: on MetopER100AM, Altace10PM;  BP= 100/60 & he denies CP, palpit, dizzy, SOB, edema; states feeling well & energy is good ~  CXR 8/15 showed norm heart size, Pacer/ICD stable, stent noted, lungs clear, NAD. ~  2/16:  He remains on MetopER100 & Altace10; BP= 138/80 & he denies any current CP, palpit, SOB, edema.  CAD (ICD-414.00) - on ASA 81mg /d & PLAVIX 75mg /d> followed by Benay Spice... ISCHEMIC CARDIOMYOPATHY (ICD-414.8) - he's had prev MI w/ mult caths/ interventions. CHRONIC SYSTOLIC HEART FAILURE (123456) AORTIC STENOSIS (ICD-424.1) > Mild by 2DEcho 10/11 MITRAL REGURGITATION (ICD-396.3) ~  last cath 5/08 showed norm Lmain, patent stent in prox LAD, 80-90% lesion in mid-LAD w/ PCI & stent placed, & 50% ostial stenosis of 1st diagonal branch of LAD; 40%mid & 70%distal CIRC; 50%prox 40%mid 40%distal RCA; Global HK w/EF=30%. ~  NuclearStressTest 4/09 showed infer scar, no ischemia, EF=40%. ~  2DEcho 10/11 showed mild LVH, decr LVF w/ EF= 35-40% & mild HK, gr1DD, mild AS & MR, mild LAdil. ~  EKG 10/12 showed pacer rhythm... 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  Hx of SYNCOPE (ICD-780.2) AV BLOCK, COMPLETE (ICD-426.0) AUTOMATIC IMPLANTABLE CARDIAC DEFIBRILLATOR SITU (ICD-V45.02) ~  Pacer/ ICD per DrTaylor w/ several adjustments in 2008... ~  5/11:  f/u DrTaylor w/ decision for elective generator change> done 6/11 & tol well. ~  He continues to f/u w/ DrTaylor every 6 months... ~  2/13: f/u DrTaylor doing satis, device  working normally, no changes made & f/u 32yr... ~  2/14: Hx Syncope, AV block, Implanted Defib> followed by DrTaylor w/ pacer checks every 2-20mo he says; generator changed 6/11; he denies CP/angina, dizzy/ syncope, etc. ~  2/15: he had f/u DrTaylor> stable & doing satis...  HYPERCHOLESTEROLEMIA (ICD-272.0) - takes SIMVASTATIN 40mg /d, GEMFIBRIZOL 600mg /d, & FISH OIL Bid... ~  [place] 08/14/07 on Simva40 showed TChol 163, TG 94, HDL 34, LDL 111... rec incr Simvastatin to 80mg /d. ~  FLP 1/10 showed TChol 125, TG 67, HDL 32, LDL 80... rec- continue same. ~  FLP 1/11 on Simva80+Gemfib600 showed TChol 137, TG 99, HDL 39, LDL 78 ~  FLP 1/12 on Simva40+Gemfib600 showed TChol 157, TG 139, HDL 38, LDL 92 ~  FLP 1/13 on Simva40+Gemfib600 showed TChol 149, TG 50, HDL 39, LDL 100 ~  FLP 1/14 on Simva40+Gemfib600 showed TChol 152, TG 74, HDL 38, LDL 99  ~  FLP 1/15 on Simva40+Gemfib600 showed TChol 159, TG 64, HDL 43, LDL 103 ~  FLP 6/15 on Simva40+Gemfib600 showed TChol 168, TG 117, HDL 41, LDL 104  ~  FLP 1/16 on Simva40+Gemfib600 showed TChol 148, TG 111, HDL 45, LDL 81  BORDERLINE TSH >> ~  Labs 1/15 showed TSH= 3.78 ~  Labs 1/16 showed an elev TSH for the 1st time w/ TSH= 5.40, he is clinically euthyroid.  GERD (ICD-530.81) - he had ERCP in 8/06 w/ norm ducts, +gallstones, s/p lap chole 2006... he  uses OTC meds Prn... ~  he notes that he had a colonoscopy in the past from DrPatterson, & he reports that it was neg. ~  7/15> GI eval from DrPerry, he didn't comment on the eosinophilia...  ELEVATED PSA >> ~  Labs 1/16 showed an elev PSA for the 1st time w/ PSA= 5.27  DEGENERATIVE JOINT DISEASE (ICD-715.90) - he tried Mobic but it bothered his stomach, therefore uses Tylenol Prn.  VITAMIN B12 DEFICIENCY (ICD-266.2) - old chart not available- ? details of this Dx... on B12 shots xyrs. ~  labs 1/09 showed Hg=12.9 ~  labs 1/10 showed Hg= 12.7 ~  labs 1/11 showed Hg= 12.6 ~  labs 1/12 showed Hg= 13.2 ~  Labs  1/13 showed Hg= 12.6 ~  Labs 1/14 showed Hg= 12.7 ~  Labs 1/15 showed Hg= 12.6, WBC= 5.7 w/ 10%eos ~  Labs 7/15 showed Hg= 11.7, WBC= 16.9 w/ 57%eos... ~  Labs 8/15 showed Hg= 11.6, WBC= 6.9, w/ 17%eos... ~  Labs 1/16 showed Hg= 12.7, WBC= 4.4 w/ 9% eos  Health Maintenance: ~  GI:  Followed by DrPatterson ~  GU:  PSA checked yearly & remains wnl... ~  Immunizations:  he gets the yearly Flu vaccine;  [place] he says;  given Pneumovax 1/11 age 47.   Past Surgical History:  Procedure Laterality Date  . CARDIAC CATHETERIZATION  2008  . icd implanted  2005  . LAPAROSCOPIC CHOLECYSTECTOMY  04/2005   Dr. Lucia Gaskins  . ROTATOR CUFF REPAIR      Outpatient Encounter Prescriptions as of 03/13/2016  Medication Sig Dispense Refill  . aspirin 81 MG tablet Take 81 mg by mouth daily.      . clopidogrel (PLAVIX) 75 MG tablet Take 1 tablet (75 mg total) by mouth daily. 90 tablet 3  . cyanocobalamin (,VITAMIN B-12,) 1000 MCG/ML injection Inject 1 mL (1,000 mcg total) into the muscle every 30 (thirty) days. 1 mL 5  . fish oil-omega-3 fatty acids 1000 MG capsule Take 2 g by mouth daily.      Marland Kitchen gemfibrozil (LOPID) 600 MG tablet Take 1 tablet (600 mg total) by mouth daily. 90 tablet 3  . metoprolol succinate (TOPROL-XL) 100 MG 24 hr tablet Take 1 tablet (100 mg total) by mouth daily. Take with or immediately following a meal. 90 tablet 3  . Multiple Vitamins-Minerals (MENS MULTIVITAMIN PLUS PO) Take 1 tablet by mouth daily.      . Polyethylene Glycol POWD Take 1 scoop by mouth daily.     . ramipril (ALTACE) 10 MG capsule Take 1 capsule (10 mg total) by mouth daily. 90 capsule 3  . simvastatin (ZOCOR) 40 MG tablet Take 1 tablet (40 mg total) by mouth every evening. 90 tablet 3   No facility-administered encounter medications on file as of 03/13/2016.     No Known Allergies   Current Medications, Allergies, Past Medical History, Past Surgical History, Family History, and  Social History were reviewed in [employer] record.    Review of Systems        See HPI - all other systems neg except as noted...  The patient complains of dyspnea on exertion.  The patient denies anorexia, fever, weight loss, weight gain, vision loss, decreased hearing, hoarseness, chest pain, syncope, peripheral edema, prolonged cough, headaches, hemoptysis, abdominal pain, melena, hematochezia, severe indigestion/heartburn, hematuria, incontinence, muscle weakness, suspicious skin lesions, transient blindness, difficulty walking, depression, unusual weight change, abnormal bleeding, enlarged  lymph nodes, and angioedema.     Objective:   Physical Exam     WD, WN, 82 y/o WM in NAD... wt=170# ht=6'1" BMI=22... GENERAL:  Alert & oriented; pleasant & cooperative... HEENT:  [place]/AT, EOM-wnl, PERRLA, EACs-clear, TMs-wnl, NOSE-clear, THROAT-clear & wnl. NECK:  Supple w/ fairROM; no JVD; normal carotid impulses w/o bruits; no thyromegaly or nodules palpated; no lymphadenopathy. CHEST:  Clear to P & A; without wheezes/ rales/ or rhonchi heard... HEART:  Pacer in L upper chest area~ nontender, RR, gr 1/6 SEM without rubs or gallops detected... ABDOMEN:  Soft & nontender; normal bowel sounds; no organomegaly or masses palpated... EXT: without deformities, mild arthritic changes; no varicose veins/ venous insuffic/ or edema. NEURO:  CN's intact; no focal neuro deficits apprec... DERM:  No lesions noted; no rash etc...  RADIOLOGY DATA:  Reviewed in the EPIC EMR & discussed w/ the patient...   LABORATORY DATA:  Reviewed in the EPIC EMR & discussed w/ the patient...     Assessment & Plan:    HBP>  Controlled on Metoprolol & Ramipril, continue same...  CAD, Cardiomyopathy, CHF, ValvHeartDis w/ AS & MR>  Followed by Tenny Craw & stable on ASA/ Plavix, still working 5d per week etc...  Hx syncope, AVBlock, Pacer/ICD>  Followed by DrTaylor & stable, continue  same...  LIPIDS>  FLP looks OK on Simva40, Gemfib 600mg /d, & Fish Oil...  GI symptoms 7/15>  Resolved w/ Colace & Miralax... He feels he is back to baseline... 03/2016>  He has mild anemia w/ Hg=10.4 & stool card pos for occult blood => refer to drPerry for further eval...  GU> Elev PSA/ prostate cancer => followed by DrHerrick for Urology on watchful waiting...  DJD>  He uses Tylenol as needed; still working regularly...  B-12 defic>  He remains on B12 shots monthly...   Patient's Medications  New Prescriptions   No medications on file  Previous Medications   ASPIRIN 81 MG TABLET    Take 81 mg by mouth daily.     CLOPIDOGREL (PLAVIX) 75 MG TABLET    Take 1 tablet (75 mg total) by mouth daily.   CYANOCOBALAMIN (,VITAMIN B-12,) 1000 MCG/ML INJECTION    Inject 1 mL (1,000 mcg total) into the muscle every 30 (thirty) days.   FISH OIL-OMEGA-3 FATTY ACIDS 1000 MG CAPSULE    Take 2 g by mouth daily.     GEMFIBROZIL (LOPID) 600 MG TABLET    Take 1 tablet (600 mg total) by mouth daily.   METOPROLOL SUCCINATE (TOPROL-XL) 100 MG 24 HR TABLET    Take 1 tablet (100 mg total) by mouth daily. Take with or immediately following a meal.   MULTIPLE VITAMINS-MINERALS (MENS MULTIVITAMIN PLUS PO)    Take 1 tablet by mouth daily.     POLYETHYLENE GLYCOL POWD    Take 1 scoop by mouth daily.    RAMIPRIL (ALTACE) 10 MG CAPSULE    Take 1 capsule (10 mg total) by mouth daily.   SIMVASTATIN (ZOCOR) 40 MG TABLET    Take 1 tablet (40 mg total) by mouth every evening.  Modified Medications   No medications on file  Discontinued Medications   No medications on file

## 2016-03-13 NOTE — Patient Instructions (Signed)
Today we updated your med list in our EPIC system...    Continue your current medications the same...  We are going to evaluate the ANEMIA found on your recent blood work-    Checking additional anemia labs today...    Collect the stool specimens to check for hidden blood...       We will contact you w/ the results when available...   Call for any questions...  Let's plan a follow up visit in 28mo, sooner if needed for problems.Marland KitchenMarland Kitchen

## 2016-03-15 LAB — PROTEIN ELECTROPHORESIS, SERUM, WITH REFLEX
ALBUMIN ELP: 4.4 g/dL (ref 3.8–4.8)
ALPHA-1-GLOBULIN: 0.3 g/dL (ref 0.2–0.3)
ALPHA-2-GLOBULIN: 0.6 g/dL (ref 0.5–0.9)
BETA 2: 0.2 g/dL (ref 0.2–0.5)
BETA GLOBULIN: 0.4 g/dL (ref 0.4–0.6)
GAMMA GLOBULIN: 0.8 g/dL (ref 0.8–1.7)
TOTAL PROTEIN, SERUM ELECTROPHOR: 6.6 g/dL (ref 6.1–8.1)

## 2016-03-20 ENCOUNTER — Other Ambulatory Visit (INDEPENDENT_AMBULATORY_CARE_PROVIDER_SITE_OTHER): Payer: Medicare Other

## 2016-03-20 DIAGNOSIS — D649 Anemia, unspecified: Secondary | ICD-10-CM | POA: Diagnosis not present

## 2016-03-21 LAB — FECAL OCCULT BLOOD, IMMUNOCHEMICAL: Fecal Occult Bld: POSITIVE — AB

## 2016-03-22 ENCOUNTER — Ambulatory Visit (INDEPENDENT_AMBULATORY_CARE_PROVIDER_SITE_OTHER): Payer: Medicare Other

## 2016-03-22 DIAGNOSIS — E538 Deficiency of other specified B group vitamins: Secondary | ICD-10-CM | POA: Diagnosis not present

## 2016-03-23 ENCOUNTER — Other Ambulatory Visit: Payer: Self-pay | Admitting: Pulmonary Disease

## 2016-03-23 DIAGNOSIS — D649 Anemia, unspecified: Secondary | ICD-10-CM

## 2016-03-23 DIAGNOSIS — K921 Melena: Secondary | ICD-10-CM

## 2016-03-23 MED ORDER — CYANOCOBALAMIN 1000 MCG/ML IJ SOLN
1000.0000 ug | Freq: Once | INTRAMUSCULAR | Status: AC
  Administered 2016-03-22: 1000 ug via INTRAMUSCULAR

## 2016-03-27 ENCOUNTER — Telehealth: Payer: Self-pay | Admitting: Cardiology

## 2016-03-27 ENCOUNTER — Ambulatory Visit (INDEPENDENT_AMBULATORY_CARE_PROVIDER_SITE_OTHER): Payer: Medicare Other | Admitting: *Deleted

## 2016-03-27 DIAGNOSIS — I255 Ischemic cardiomyopathy: Secondary | ICD-10-CM | POA: Diagnosis not present

## 2016-03-27 NOTE — Telephone Encounter (Signed)
LMOVM reminding pt to send remote transmission.   

## 2016-03-28 NOTE — Progress Notes (Signed)
Remote ICD transmission.   

## 2016-04-04 ENCOUNTER — Encounter: Payer: Self-pay | Admitting: Cardiology

## 2016-04-10 LAB — CUP PACEART REMOTE DEVICE CHECK
Brady Statistic RA Percent Paced: 42 %
Brady Statistic RV Percent Paced: 98 %
HIGH POWER IMPEDANCE MEASURED VALUE: 39 Ohm
Implantable Lead Implant Date: 20050509
Implantable Lead Implant Date: 20050509
Implantable Lead Location: 753859
Lead Channel Impedance Value: 450 Ohm
Lead Channel Pacing Threshold Pulse Width: 0.5 ms
Lead Channel Sensing Intrinsic Amplitude: 1.1 mV
Lead Channel Setting Pacing Amplitude: 2.125
Lead Channel Setting Pacing Pulse Width: 0.5 ms
MDC IDC LEAD LOCATION: 753860
MDC IDC MSMT BATTERY REMAINING LONGEVITY: 28 mo
MDC IDC MSMT BATTERY REMAINING PERCENTAGE: 29 %
MDC IDC MSMT LEADCHNL RV IMPEDANCE VALUE: 340 Ohm
MDC IDC MSMT LEADCHNL RV PACING THRESHOLD AMPLITUDE: 1.125 V
MDC IDC SESS DTM: 20170905084158
MDC IDC SET LEADCHNL RA PACING AMPLITUDE: 2 V
MDC IDC SET LEADCHNL RV SENSING SENSITIVITY: 0.5 mV
Pulse Gen Serial Number: 610170

## 2016-04-23 ENCOUNTER — Ambulatory Visit: Payer: BLUE CROSS/BLUE SHIELD

## 2016-04-24 ENCOUNTER — Ambulatory Visit (INDEPENDENT_AMBULATORY_CARE_PROVIDER_SITE_OTHER): Payer: Medicare Other

## 2016-04-24 DIAGNOSIS — E538 Deficiency of other specified B group vitamins: Secondary | ICD-10-CM

## 2016-04-25 DIAGNOSIS — E538 Deficiency of other specified B group vitamins: Secondary | ICD-10-CM

## 2016-04-25 MED ORDER — CYANOCOBALAMIN 1000 MCG/ML IJ SOLN
1000.0000 ug | Freq: Once | INTRAMUSCULAR | Status: AC
  Administered 2016-04-25: 1000 ug via INTRAMUSCULAR

## 2016-04-27 DIAGNOSIS — R972 Elevated prostate specific antigen [PSA]: Secondary | ICD-10-CM | POA: Diagnosis not present

## 2016-05-16 ENCOUNTER — Encounter: Payer: Self-pay | Admitting: Internal Medicine

## 2016-05-16 ENCOUNTER — Ambulatory Visit (INDEPENDENT_AMBULATORY_CARE_PROVIDER_SITE_OTHER): Payer: BLUE CROSS/BLUE SHIELD | Admitting: Internal Medicine

## 2016-05-16 VITALS — BP 140/72 | HR 68 | Ht 71.0 in | Wt 165.2 lb

## 2016-05-16 DIAGNOSIS — Z8719 Personal history of other diseases of the digestive system: Secondary | ICD-10-CM

## 2016-05-16 DIAGNOSIS — I255 Ischemic cardiomyopathy: Secondary | ICD-10-CM | POA: Diagnosis not present

## 2016-05-16 DIAGNOSIS — Z8711 Personal history of peptic ulcer disease: Secondary | ICD-10-CM

## 2016-05-16 DIAGNOSIS — R195 Other fecal abnormalities: Secondary | ICD-10-CM

## 2016-05-16 MED ORDER — OMEPRAZOLE 20 MG PO CPDR: 20.0000 mg | DELAYED_RELEASE_CAPSULE | Freq: Every day | ORAL | 11 refills | Status: DC

## 2016-05-16 NOTE — Progress Notes (Signed)
HISTORY OF PRESENT ILLNESS:  [name] is a 82 y.o. male with multiple significant complicated medical problems as listed below. He was evaluated 03/02/2014 for chronic abdominal discomfort, constipation, and rectal discomfort with defecation. Previous patient of [name]. Last colonoscopy 2005 was normal. Cologard testing 2014 was negative (felt too high risk for endoscopy). See that dictation for details. The clinical impressions were chronic constipation, anal stenosis, abdominal pain secondary to constipation, and multiple significant medical problems. He was treated with Colace, fiber, and MiraLAX. He is sent today by his primary care provider regarding anemia and Hemoccult-positive stool. The patient does have a history of pernicious anemia for she is on adequate B12 replacement. Reviewing outside records finds that the patient's hemoglobin has been quite stable for some time in the 12 range. However early August hemoglobin was noted to be 10.4. Iron studies, B12, folate, were unremarkable. Hemoglobin obtain just 4 days later was normal at 12.3. Patient did submit Hemoccult cards which were positive. Patient denies problems with reflux, dysphagia, abdominal pain, melena, or hematochezia. He is on chronic aspirin and Plavix therapy. His problems with constipation have improved with bowel regimen. No weight loss. Does have her remote history of ulcer disease.  REVIEW OF SYSTEMS:  All non-GI ROS negative except for arthritis, hearing problems  Past Medical History:  Diagnosis Date  . Anemia, unspecified   . Aortic stenosis    Mild, echo, October, 2011  . AV block, complete (HCC)    Permanent pacemaker  . CAD (coronary artery disease)    DES to LAD 2005 /  DES to LAD 2008  . Cardiomyopathy    Ischemic, EF 30%, 2005  . DJD (degenerative joint disease)   . Dyslipidemia   . Ejection fraction < 50%    EF 30%, echo, 2005  /  EF 30%, catheterization, 2008  . GERD  (gastroesophageal reflux disease)   . Hyperlipidemia   . Hypertension   . ICD (implantable cardiac defibrillator), single, in situ    for syncope and arrhythmia 2005 / generator change June, 2011  . LBBB (left bundle branch block)   . Mitral regurgitation    Mild, echo, October, 2011  . Rectal fissure   . Syncope    2005, ICD placed  . Systolic CHF, chronic ([name])   . Vitamin B12 deficiency     Past Surgical History:  Procedure Laterality Date  . CARDIAC CATHETERIZATION  2008  . icd implanted  2005  . LAPAROSCOPIC CHOLECYSTECTOMY  04/2005   [name]  . ROTATOR CUFF REPAIR      Social History [name]  reports that he quit smoking about 63 years ago. His smoking use included Cigarettes. He has a 2.00 pack-year smoking history. He has never used smokeless tobacco. He reports that he does not drink alcohol or use drugs.  family history includes Cancer (age of onset: 20) in his mother; Heart attack (age of onset: 71) in his father; Lung cancer in his brother; Stroke in his brother and mother.  No Known Allergies     PHYSICAL EXAMINATION: Vital signs: BP 140/72   Pulse 68   Ht 5\' 11"  (1.803 m)   Wt 165 lb 4 oz (75 kg)   BMI 23.05 kg/m   Constitutional: Thin elderly male, generally well-appearing, no acute distress Psychiatric: alert and oriented x3, cooperative Eyes: extraocular movements intact, anicteric, conjunctiva pink Mouth: oral pharynx moist, no lesions Neck: supple no lymphadenopathy Cardiovascular: heart regular rate and rhythm, systolic  ejection murmur Lungs: clear to auscultation bilaterally Abdomen: soft, nontender, nondistended, no obvious ascites, no peritoneal signs, normal bowel sounds, no organomegaly Rectal: Omitted Extremities: no lower extremity edema bilaterally Skin: no lesions on visible extremities Neuro: No focal deficits. Cranial nerves intact.   ASSESSMENT:  #1. "Anemia". Hemoglobin of 10.4 appeared to be spurious. No evidence  for iron deficiency. On adequate B12 replacement #2. Heme positive stool. Etiology unclear. Is on chronic aspirin and Plavix. Has a history of ulcer disease #3. History of significant constipation. Improved on bowel regimen. Continue bowel regimen  PLAN:  #1. Discussed spurious lab value #2. Discussed multiple causes for Hemoccult positive stool ranging from dietary antibodies to cancer #3. Patient still felt to be high risk for endoscopic examinations. No worrisome signs or symptoms. Previous evaluations as described. As such, recommend that he initiate omeprazole 20 mg daily for upper GI mucosal protection given the chronic need for aspirin and Plavix. Particular given his age, comorbidities, and a history of ulcer disease. The data is convincing in this regard. #4. Return to the care of [name]. GI follow-up as needed

## 2016-05-16 NOTE — Patient Instructions (Signed)
We have sent the following medications to your pharmacy for you to pick up at your convenience:  Omeprazole.  Please follow up in one year  

## 2016-05-20 ENCOUNTER — Other Ambulatory Visit: Payer: Self-pay | Admitting: Pulmonary Disease

## 2016-05-23 ENCOUNTER — Ambulatory Visit (INDEPENDENT_AMBULATORY_CARE_PROVIDER_SITE_OTHER): Payer: BLUE CROSS/BLUE SHIELD

## 2016-05-23 DIAGNOSIS — E538 Deficiency of other specified B group vitamins: Secondary | ICD-10-CM | POA: Diagnosis not present

## 2016-05-24 DIAGNOSIS — E538 Deficiency of other specified B group vitamins: Secondary | ICD-10-CM | POA: Diagnosis not present

## 2016-05-24 MED ORDER — CYANOCOBALAMIN 1000 MCG/ML IJ SOLN
1000.0000 ug | Freq: Once | INTRAMUSCULAR | Status: AC
  Administered 2016-05-24: 1000 ug via INTRAMUSCULAR

## 2016-06-25 ENCOUNTER — Ambulatory Visit (INDEPENDENT_AMBULATORY_CARE_PROVIDER_SITE_OTHER): Payer: BLUE CROSS/BLUE SHIELD

## 2016-06-25 DIAGNOSIS — E538 Deficiency of other specified B group vitamins: Secondary | ICD-10-CM

## 2016-06-25 MED ORDER — CYANOCOBALAMIN 1000 MCG/ML IJ SOLN
1000.0000 ug | Freq: Once | INTRAMUSCULAR | Status: AC
  Administered 2016-06-25: 1000 ug via INTRAMUSCULAR

## 2016-06-26 ENCOUNTER — Ambulatory Visit (INDEPENDENT_AMBULATORY_CARE_PROVIDER_SITE_OTHER): Payer: BLUE CROSS/BLUE SHIELD | Admitting: *Deleted

## 2016-06-26 DIAGNOSIS — I255 Ischemic cardiomyopathy: Secondary | ICD-10-CM | POA: Diagnosis not present

## 2016-06-26 NOTE — Progress Notes (Signed)
Remote ICD transmission.   

## 2016-07-05 ENCOUNTER — Encounter: Payer: Self-pay | Admitting: Cardiology

## 2016-07-19 LAB — CUP PACEART REMOTE DEVICE CHECK
Battery Remaining Longevity: 27 mo
Battery Remaining Percentage: 28 %
Brady Statistic RA Percent Paced: 43 %
Date Time Interrogation Session: 20171214105838
HighPow Impedance: 40 Ohm
Implantable Lead Implant Date: 20050509
Implantable Lead Location: 753859
Implantable Lead Location: 753860
Implantable Pulse Generator Implant Date: 20110609
Lead Channel Impedance Value: 350 Ohm
Lead Channel Setting Pacing Amplitude: 2 V
Lead Channel Setting Pacing Pulse Width: 0.5 ms
Lead Channel Setting Sensing Sensitivity: 0.5 mV
MDC IDC LEAD IMPLANT DT: 20050509
MDC IDC MSMT LEADCHNL RA IMPEDANCE VALUE: 440 Ohm
MDC IDC MSMT LEADCHNL RA SENSING INTR AMPL: 2.3 mV
MDC IDC MSMT LEADCHNL RV PACING THRESHOLD AMPLITUDE: 1.25 V
MDC IDC MSMT LEADCHNL RV PACING THRESHOLD PULSEWIDTH: 0.5 ms
MDC IDC PG SERIAL: 610170
MDC IDC SET LEADCHNL RV PACING AMPLITUDE: 2.125
MDC IDC STAT BRADY RV PERCENT PACED: 98 %

## 2016-07-25 ENCOUNTER — Ambulatory Visit (INDEPENDENT_AMBULATORY_CARE_PROVIDER_SITE_OTHER): Payer: BLUE CROSS/BLUE SHIELD

## 2016-07-25 DIAGNOSIS — E538 Deficiency of other specified B group vitamins: Secondary | ICD-10-CM | POA: Diagnosis not present

## 2016-07-26 MED ORDER — CYANOCOBALAMIN 1000 MCG/ML IJ SOLN
1000.0000 ug | Freq: Once | INTRAMUSCULAR | Status: AC
  Administered 2016-07-25: 1000 ug via INTRAMUSCULAR

## 2016-08-27 ENCOUNTER — Ambulatory Visit (INDEPENDENT_AMBULATORY_CARE_PROVIDER_SITE_OTHER): Payer: BLUE CROSS/BLUE SHIELD

## 2016-08-27 DIAGNOSIS — E538 Deficiency of other specified B group vitamins: Secondary | ICD-10-CM

## 2016-08-28 MED ORDER — CYANOCOBALAMIN 1000 MCG/ML IJ SOLN
1000.0000 ug | Freq: Once | INTRAMUSCULAR | Status: AC
  Administered 2016-08-27: 1000 ug via INTRAMUSCULAR

## 2016-09-10 ENCOUNTER — Telehealth: Payer: Self-pay | Admitting: Pulmonary Disease

## 2016-09-10 NOTE — Telephone Encounter (Signed)
lmtcb x1 for pt. 

## 2016-09-10 NOTE — Telephone Encounter (Signed)
Patient returned call, CB is [phone].

## 2016-09-10 NOTE — Telephone Encounter (Signed)
Spoke with pt. He is wanting to have lab work done prior to his appointment on 09/13/16.  SN - please advise on what lab work you would like the pt to have. Thanks.

## 2016-09-13 ENCOUNTER — Ambulatory Visit (INDEPENDENT_AMBULATORY_CARE_PROVIDER_SITE_OTHER)
Admission: RE | Admit: 2016-09-13 | Discharge: 2016-09-13 | Disposition: A | Discharge status: Home / Self Care | Payer: BLUE CROSS/BLUE SHIELD | Source: Ambulatory Visit | Attending: Pulmonary Disease | Admitting: Pulmonary Disease

## 2016-09-13 ENCOUNTER — Ambulatory Visit (INDEPENDENT_AMBULATORY_CARE_PROVIDER_SITE_OTHER): Payer: Medicare Other | Admitting: Pulmonary Disease

## 2016-09-13 VITALS — BP 140/80 | HR 50 | Temp 97.2°F | Ht 71.0 in | Wt 162.5 lb

## 2016-09-13 DIAGNOSIS — E538 Deficiency of other specified B group vitamins: Secondary | ICD-10-CM | POA: Diagnosis not present

## 2016-09-13 DIAGNOSIS — I255 Ischemic cardiomyopathy: Secondary | ICD-10-CM | POA: Diagnosis not present

## 2016-09-13 DIAGNOSIS — I5022 Chronic systolic (congestive) heart failure: Secondary | ICD-10-CM | POA: Diagnosis not present

## 2016-09-13 DIAGNOSIS — I10 Essential (primary) hypertension: Secondary | ICD-10-CM

## 2016-09-13 DIAGNOSIS — Z95810 Presence of automatic (implantable) cardiac defibrillator: Secondary | ICD-10-CM

## 2016-09-13 DIAGNOSIS — M150 Primary generalized (osteo)arthritis: Secondary | ICD-10-CM

## 2016-09-13 DIAGNOSIS — R972 Elevated prostate specific antigen [PSA]: Secondary | ICD-10-CM | POA: Diagnosis not present

## 2016-09-13 DIAGNOSIS — I447 Left bundle-branch block, unspecified: Secondary | ICD-10-CM

## 2016-09-13 DIAGNOSIS — I2510 Atherosclerotic heart disease of native coronary artery without angina pectoris: Secondary | ICD-10-CM

## 2016-09-13 DIAGNOSIS — M159 Polyosteoarthritis, unspecified: Secondary | ICD-10-CM

## 2016-09-13 DIAGNOSIS — E785 Hyperlipidemia, unspecified: Secondary | ICD-10-CM | POA: Diagnosis not present

## 2016-09-13 DIAGNOSIS — I350 Nonrheumatic aortic (valve) stenosis: Secondary | ICD-10-CM | POA: Diagnosis not present

## 2016-09-13 DIAGNOSIS — E0590 Thyrotoxicosis, unspecified without thyrotoxic crisis or storm: Secondary | ICD-10-CM

## 2016-09-13 DIAGNOSIS — D649 Anemia, unspecified: Secondary | ICD-10-CM | POA: Diagnosis not present

## 2016-09-13 MED ORDER — SIMVASTATIN 40 MG PO TABS: 40.0000 mg | ORAL_TABLET | Freq: Every evening | ORAL | 3 refills | Status: DC

## 2016-09-13 MED ORDER — OMEPRAZOLE 20 MG PO CPDR: 20.0000 mg | DELAYED_RELEASE_CAPSULE | Freq: Every day | ORAL | 3 refills | Status: DC

## 2016-09-13 MED ORDER — GEMFIBROZIL 600 MG PO TABS: 600.0000 mg | ORAL_TABLET | Freq: Every day | ORAL | 3 refills | Status: DC

## 2016-09-13 MED ORDER — RAMIPRIL 10 MG PO CAPS: 10.0000 mg | ORAL_CAPSULE | Freq: Every day | ORAL | 3 refills | Status: DC

## 2016-09-13 MED ORDER — METOPROLOL SUCCINATE ER 100 MG PO TB24: 100.0000 mg | ORAL_TABLET | Freq: Every day | ORAL | 3 refills | Status: DC

## 2016-09-13 MED ORDER — CYANOCOBALAMIN 1000 MCG/ML IJ SOLN: INTRAMUSCULAR | 5 refills | Status: DC

## 2016-09-13 NOTE — Telephone Encounter (Signed)
Pt was seen today and labs have been placed in the computer.

## 2016-09-13 NOTE — Patient Instructions (Signed)
Today we updated your med list in our EPIC system...    Continue your current medications the same...    We refilled the PRILOSEC 20mg  for a 90 d supply...  Today we checked a follow up CXR... Please return to our lab one morning soon for your FASTING blood work...    We will contact you w/ the results when available...   Call for any questions...  ENJOY YOUR RETIREMENT!  Let's plan a follow up visit in 33mo, sooner if needed for any problems.Marland KitchenMarland Kitchen

## 2016-09-13 NOTE — Telephone Encounter (Signed)
SN please advise. Thanks.  

## 2016-09-17 ENCOUNTER — Other Ambulatory Visit (INDEPENDENT_AMBULATORY_CARE_PROVIDER_SITE_OTHER): Payer: BLUE CROSS/BLUE SHIELD

## 2016-09-17 DIAGNOSIS — E785 Hyperlipidemia, unspecified: Secondary | ICD-10-CM

## 2016-09-17 DIAGNOSIS — D649 Anemia, unspecified: Secondary | ICD-10-CM

## 2016-09-17 DIAGNOSIS — I10 Essential (primary) hypertension: Secondary | ICD-10-CM

## 2016-09-17 DIAGNOSIS — E538 Deficiency of other specified B group vitamins: Secondary | ICD-10-CM

## 2016-09-17 DIAGNOSIS — E0590 Thyrotoxicosis, unspecified without thyrotoxic crisis or storm: Secondary | ICD-10-CM

## 2016-09-17 LAB — CBC WITH DIFFERENTIAL/PLATELET
BASOS PCT: 0.6 % (ref 0.0–3.0)
Basophils Absolute: 0 10*3/uL (ref 0.0–0.1)
EOS ABS: 0.3 10*3/uL (ref 0.0–0.7)
Eosinophils Relative: 5.3 % — ABNORMAL HIGH (ref 0.0–5.0)
HEMATOCRIT: 34.5 % — AB (ref 39.0–52.0)
HEMOGLOBIN: 12 g/dL — AB (ref 13.0–17.0)
LYMPHS PCT: 23.4 % (ref 12.0–46.0)
Lymphs Abs: 1.3 10*3/uL (ref 0.7–4.0)
MCHC: 34.8 g/dL (ref 30.0–36.0)
MCV: 93.1 fl (ref 78.0–100.0)
MONOS PCT: 10.9 % (ref 3.0–12.0)
Monocytes Absolute: 0.6 10*3/uL (ref 0.1–1.0)
Neutro Abs: 3.4 10*3/uL (ref 1.4–7.7)
Neutrophils Relative %: 59.8 % (ref 43.0–77.0)
Platelets: 187 10*3/uL (ref 150.0–400.0)
RBC: 3.71 Mil/uL — ABNORMAL LOW (ref 4.22–5.81)
RDW: 12.5 % (ref 11.5–15.5)
WBC: 5.7 10*3/uL (ref 4.0–10.5)

## 2016-09-17 LAB — LIPID PANEL
CHOLESTEROL: 147 mg/dL (ref 0–200)
HDL: 43.2 mg/dL (ref >39.00)
LDL CALC: 69 mg/dL (ref 0–99)
NonHDL: 104.09
TRIGLYCERIDES: 173 mg/dL — AB (ref 0.0–149.0)
Total CHOL/HDL Ratio: 3
VLDL: 34.6 mg/dL (ref 0.0–40.0)

## 2016-09-17 LAB — COMPREHENSIVE METABOLIC PANEL
ALBUMIN: 4.3 g/dL (ref 3.5–5.2)
ALT: 14 U/L (ref 0–53)
AST: 23 U/L (ref 0–37)
Alkaline Phosphatase: 48 U/L (ref 39–117)
BUN: 17 mg/dL (ref 6–23)
CALCIUM: 9.1 mg/dL (ref 8.4–10.5)
CHLORIDE: 107 meq/L (ref 96–112)
CO2: 29 mEq/L (ref 19–32)
Creatinine, Ser: 1 mg/dL (ref 0.40–1.50)
GFR: 75.99 mL/min (ref >60.00)
Glucose, Bld: 97 mg/dL (ref 70–99)
POTASSIUM: 4.1 meq/L (ref 3.5–5.1)
SODIUM: 142 meq/L (ref 135–145)
Total Bilirubin: 1.2 mg/dL (ref 0.2–1.2)
Total Protein: 6.5 g/dL (ref 6.0–8.3)

## 2016-09-17 LAB — TSH: TSH: 4.93 u[IU]/mL — ABNORMAL HIGH (ref 0.35–4.50)

## 2016-09-17 LAB — VITAMIN B12: Vitamin B-12: 240 pg/mL (ref 211–911)

## 2016-09-17 NOTE — Progress Notes (Signed)
Left message for patient to call back  

## 2016-09-18 ENCOUNTER — Telehealth: Payer: Self-pay | Admitting: Pulmonary Disease

## 2016-09-18 NOTE — Telephone Encounter (Signed)
Notes Recorded by Noralee Space, MD on 09/14/2016 at 1:20 PM EST Please notify patient>  CXR looks good w/ norm heart size, some atherosclerotic changes in Ao, clear lungs, AICD in good position in left chest... ------------------------------------------- Spoke with pt's daughter, Freda Munro. She is aware of results. Nothing further was needed.

## 2016-09-28 ENCOUNTER — Ambulatory Visit (INDEPENDENT_AMBULATORY_CARE_PROVIDER_SITE_OTHER): Payer: BLUE CROSS/BLUE SHIELD

## 2016-09-28 DIAGNOSIS — E538 Deficiency of other specified B group vitamins: Secondary | ICD-10-CM | POA: Diagnosis not present

## 2016-10-01 DIAGNOSIS — E538 Deficiency of other specified B group vitamins: Secondary | ICD-10-CM | POA: Diagnosis not present

## 2016-10-01 MED ORDER — CYANOCOBALAMIN 1000 MCG/ML IJ SOLN
1000.0000 ug | Freq: Once | INTRAMUSCULAR | Status: AC
  Administered 2016-10-01: 1000 ug via INTRAMUSCULAR

## 2016-10-01 NOTE — Progress Notes (Signed)
Documentation of medication administration of Vitamin B12 and charges have been completed by Desmond Dike, CMA based on the Vitamin B12 documentation sheet completed by Alroy Bailiff.

## 2016-10-09 DIAGNOSIS — C61 Malignant neoplasm of prostate: Secondary | ICD-10-CM | POA: Diagnosis not present

## 2016-10-14 ENCOUNTER — Other Ambulatory Visit: Payer: Self-pay | Admitting: Pulmonary Disease

## 2016-10-15 DIAGNOSIS — R972 Elevated prostate specific antigen [PSA]: Secondary | ICD-10-CM | POA: Diagnosis not present

## 2016-10-15 NOTE — Progress Notes (Signed)
Cardiology Office Note Date:  10/16/2016  Patient ID:  [name] 08-Sep-1931, MRN [id number] PCP:  Noralee Space, MD  Cardiologist:  Dr. Angelena Form Electrophysiologist: Dr. [name]    Chief Complaint: annual EP visit  History of Present Illness: [name] is a 83 y.o. male with history of CAD last PCI 2008, ICM w/hx of CHB w/ICD, HTN, HLD, chronic systolic CHF, LBBB (no LV lead place, by Dr. Tanna Furry notes given lack of any class II or III symptoms).  He comes in to the office today to be seen for Dr. [name], last seem by him Feb 2017, at that time with stable device function, doing well and no changes were made.  The patient is feeling very well, he denies any  Kind of CP, palpitations, no exertional intolerances, denies any dizziness, near syncope or syncope, he reports never being shocked by his device that he is aware of.  He is pending a prostate biopsy for increasing PSA, he states his urologist asked about any hx of AFib but not sure why, he thinks he may have misunderstood and was asked about his Defib.   DEVICE information SJM dual chamber ICD, gen change 01/12/10, original implant 2005    Past Medical History:  Diagnosis Date  . Anemia, unspecified   . Aortic stenosis    Mild, echo, October, 2011  . AV block, complete (HCC)    Permanent pacemaker  . CAD (coronary artery disease)    DES to LAD 2005 /  DES to LAD 2008  . Cardiomyopathy    Ischemic, EF 30%, 2005  . DJD (degenerative joint disease)   . Dyslipidemia   . Ejection fraction < 50%    EF 30%, echo, 2005  /  EF 30%, catheterization, 2008  . GERD (gastroesophageal reflux disease)   . Hyperlipidemia   . Hypertension   . ICD (implantable cardiac defibrillator), single, in situ    for syncope and arrhythmia 2005 / generator change June, 2011  . LBBB (left bundle branch block)   . Mitral regurgitation    Mild, echo, October, 2011  . Rectal fissure   . Syncope    2005, ICD placed  . Systolic  CHF, chronic ([place])   . Vitamin B12 deficiency     Past Surgical History:  Procedure Laterality Date  . CARDIAC CATHETERIZATION  2008  . icd implanted  2005  . LAPAROSCOPIC CHOLECYSTECTOMY  04/2005   Dr. Lucia Gaskins  . ROTATOR CUFF REPAIR      Current Outpatient Prescriptions  Medication Sig Dispense Refill  . aspirin 81 MG tablet Take 81 mg by mouth daily.      . clopidogrel (PLAVIX) 75 MG tablet TAKE 1 TABLET DAILY 90 tablet 3  . cyanocobalamin (,VITAMIN B-12,) 1000 MCG/ML injection INJECT 1 ML INTO THE MUSCLE EVERY 30 DAYS 1 mL 5  . fish oil-omega-3 fatty acids 1000 MG capsule Take 2 g by mouth daily.      Marland Kitchen gemfibrozil (LOPID) 600 MG tablet Take 1 tablet (600 mg total) by mouth daily. 90 tablet 3  . metoprolol succinate (TOPROL-XL) 100 MG 24 hr tablet Take 1 tablet (100 mg total) by mouth daily. Take with or immediately following a meal. 90 tablet 3  . Multiple Vitamins-Minerals (MENS MULTIVITAMIN PLUS PO) Take 1 tablet by mouth daily.      Marland Kitchen omeprazole (PRILOSEC) 20 MG capsule Take 1 capsule (20 mg total) by mouth daily. Take 30 minutes before a meal 90 capsule 3  .  Polyethylene Glycol POWD Take 1 scoop by mouth daily.     . ramipril (ALTACE) 10 MG capsule Take 1 capsule (10 mg total) by mouth daily. 90 capsule 3  . simvastatin (ZOCOR) 40 MG tablet Take 1 tablet (40 mg total) by mouth every evening. 90 tablet 3   No current facility-administered medications for this visit.     Allergies:   Patient has no known allergies.   Social History:  The patient  reports that he quit smoking about 64 years ago. His smoking use included Cigarettes. He has a 2.00 pack-year smoking history. He has never used smokeless tobacco. He reports that he does not drink alcohol or use drugs.   Family History:  The patient's family history includes Cancer (age of onset: 77) in his mother; Heart attack (age of onset: 2) in his father; Lung cancer in his brother; Stroke in his brother and mother.  ROS:   Please see the history of present illness.   All other systems are reviewed and otherwise negative.   PHYSICAL EXAM:  VS:  BP (!) 142/70   Pulse (!) 50   Ht 5\' 11"  (1.803 m)   Wt 170 lb (77.1 kg)   BMI 23.71 kg/m  BMI: Body mass index is 23.71 kg/m. Well nourished, well developed, in no acute distress  HEENT: normocephalic, atraumatic  Neck: no JVD, carotid bruits or masses Cardiac:  RRR; no significant murmurs, no rubs, or gallops Lungs:  CTA b/l, no wheezing, rhonchi or rales  Abd: soft, nontender MS: no deformity or atrophy Ext: no edema  Skin: warm and dry, no rash Neuro:  No gross deficits appreciated Psych: euthymic mood, full affect  ICD site is stable, no tethering or discomfort   EKG:  Done 03/08/16 was AV paced Device interrogation done today and reviewed by myself: presents AV paced, battery and lead measurements are stable, he is device dependent at 35bpm, cap confirm test was also run/updated.  There have been no observed arrhythmias by the device 02/16/15: TTE Study Conclusions - Left ventricle: The cavity size was normal. Systolic function was   mildly to moderately reduced. The estimated ejection fraction was   in the range of 40% to 45%. Endocardial segments are not well   visulaized. There is akinesis of the apical septal myocardium.   There is akinesis of the midinferoseptal myocardium. There is   akinesis of the entire apical myocardium. Recommend limited echo   with definity contrast to adequately evaluate for wall motion   abnormalities. There was an increased relative contribution of   atrial contraction to ventricular filling. Doppler parameters are   consistent with abnormal left ventricular relaxation (grade 1   diastolic dysfunction). - Ventricular septum: Septal motion showed paradox. - Aortic valve: Severely focal calcification of the aortic vavle   annulus. Trileaflet. Mild thickening and calcification,   consistent with sclerosis. There was mild  regurgitation. - Mitral valve: Calcified annulus. Mildly thickened leaflets . - Pulmonic valve: There was trivial regurgitation.   Recent Labs: 09/17/2016: ALT 14; BUN 17; Creatinine, Ser 1.00; Hemoglobin 12.0; Platelets 187.0; Potassium 4.1; Sodium 142; TSH 4.93  09/17/2016: Cholesterol 147; HDL 43.20; LDL Cholesterol 69; Total CHOL/HDL Ratio 3; Triglycerides 173.0; VLDL 34.6   CrCl cannot be calculated (Patient's most recent lab result is older than the maximum 21 days allowed.).   Wt Readings from Last 3 Encounters:  10/16/16 170 lb (77.1 kg)  09/13/16 162 lb 8 oz (73.7 kg)  05/16/16 165 lb 4 oz (  75 kg)     Other studies reviewed: Additional studies/records reviewed today include: summarized above  ASSESSMENT AND PLAN:  1. ICM w/ICD 2. CHB     normal device function, no changes made  3. Chronic CHF     On BB/ACE     His weight is up but the patient reports this is due to his heavy clothing/boots today, he weighs daily at home and reports stable without any substantial weight gain      CorVue is below threshold though appears with a recent upward turn, his exam does not suggest fluid OL      Denies DOE, PND or orthopnea  4. CAD     no symptoms     On ASA/plavix, BB, statin     continue with Dr. Angelena Form  5. HTN     stable with no changes   Disposition: Continue his Q 8month remotes, ROV with Dr. [name] in 1 year, c/w Dr. Angelena Form as directed by him.  Current medicines are reviewed at length with the patient today.  The patient did not have any concerns regarding medicines.  Kyle Lasso, PA-C 10/16/2016 10:49 AM     [facility] [place]  [place] [address] [phone] (office)  [phone] (fax)

## 2016-10-16 ENCOUNTER — Ambulatory Visit (INDEPENDENT_AMBULATORY_CARE_PROVIDER_SITE_OTHER): Payer: BLUE CROSS/BLUE SHIELD | Admitting: Physician Assistant

## 2016-10-16 ENCOUNTER — Telehealth: Payer: Self-pay | Admitting: Pulmonary Disease

## 2016-10-16 VITALS — BP 142/70 | HR 50 | Ht 71.0 in | Wt 170.0 lb

## 2016-10-16 DIAGNOSIS — I5022 Chronic systolic (congestive) heart failure: Secondary | ICD-10-CM | POA: Diagnosis not present

## 2016-10-16 DIAGNOSIS — I255 Ischemic cardiomyopathy: Secondary | ICD-10-CM | POA: Diagnosis not present

## 2016-10-16 DIAGNOSIS — Z95810 Presence of automatic (implantable) cardiac defibrillator: Secondary | ICD-10-CM

## 2016-10-16 DIAGNOSIS — I2510 Atherosclerotic heart disease of native coronary artery without angina pectoris: Secondary | ICD-10-CM | POA: Diagnosis not present

## 2016-10-16 DIAGNOSIS — I442 Atrioventricular block, complete: Secondary | ICD-10-CM | POA: Diagnosis not present

## 2016-10-16 DIAGNOSIS — I10 Essential (primary) hypertension: Secondary | ICD-10-CM

## 2016-10-16 MED ORDER — CLOPIDOGREL BISULFATE 75 MG PO TABS: 75.0000 mg | ORAL_TABLET | Freq: Every day | ORAL | 0 refills | Status: DC

## 2016-10-16 NOTE — Patient Instructions (Signed)
Medication Instructions:    Your physician recommends that you continue on your current medications as directed. Please refer to the Current Medication list given to you today.   If you need a refill on your cardiac medications before your next appointment, please call your pharmacy.  Labwork: NONE ORDERED  TODAY    Testing/Procedures: NONE ORDERED  TODAY    Follow-Up: Your physician wants you to follow-up in: [place] will receive a reminder letter in the mail two months in advance. If you don't receive a letter, please call our office to schedule the follow-up appointment.   Remote monitoring is used to monitor your Pacemaker of ICD from home. This monitoring reduces the number of office visits required to check your device to one time per year. It allows [place] to keep an eye on the functioning of your device to ensure it is working properly. You are scheduled for a device check from home on . 01-17-2017..You may send your transmission at any time that day. If you have a wireless device, the transmission will be sent automatically. After your physician reviews your transmission, you will receive a postcard with your next transmission date.    Any Other Special Instructions Will Be Listed Below (If Applicable).

## 2016-10-16 NOTE — Telephone Encounter (Signed)
[facility]'s [facility] but shipment will not be received for 1-2 weeks.  Pt's daughter requesting we send in a 30 day supply to local pharmacy as pt is out of medication.  This has been sent.  Nothing further needed.

## 2016-10-25 ENCOUNTER — Ambulatory Visit (INDEPENDENT_AMBULATORY_CARE_PROVIDER_SITE_OTHER): Payer: BLUE CROSS/BLUE SHIELD

## 2016-10-25 DIAGNOSIS — E538 Deficiency of other specified B group vitamins: Secondary | ICD-10-CM | POA: Diagnosis not present

## 2016-10-26 MED ORDER — CYANOCOBALAMIN 1000 MCG/ML IJ SOLN
1000.0000 ug | Freq: Once | INTRAMUSCULAR | Status: AC
  Administered 2016-10-25: 1000 ug via INTRAMUSCULAR

## 2016-10-26 NOTE — Progress Notes (Signed)
Pt brings in medication from his pharmacy.

## 2016-11-06 ENCOUNTER — Encounter: Payer: BLUE CROSS/BLUE SHIELD | Admitting: Internal Medicine

## 2016-11-06 DIAGNOSIS — R972 Elevated prostate specific antigen [PSA]: Secondary | ICD-10-CM | POA: Diagnosis not present

## 2016-11-06 DIAGNOSIS — C61 Malignant neoplasm of prostate: Secondary | ICD-10-CM | POA: Diagnosis not present

## 2016-11-20 DIAGNOSIS — C61 Malignant neoplasm of prostate: Secondary | ICD-10-CM | POA: Diagnosis not present

## 2016-11-21 ENCOUNTER — Other Ambulatory Visit: Payer: Self-pay | Admitting: Urology

## 2016-11-21 DIAGNOSIS — C61 Malignant neoplasm of prostate: Secondary | ICD-10-CM

## 2016-11-22 ENCOUNTER — Encounter: Payer: Self-pay | Admitting: Radiation Oncology

## 2016-11-26 ENCOUNTER — Telehealth: Payer: Self-pay | Admitting: Pulmonary Disease

## 2016-11-26 MED ORDER — GEMFIBROZIL 600 MG PO TABS: 600.0000 mg | ORAL_TABLET | Freq: Every day | ORAL | 3 refills | Status: DC

## 2016-11-26 MED ORDER — METOPROLOL SUCCINATE ER 100 MG PO TB24: 100.0000 mg | ORAL_TABLET | Freq: Every day | ORAL | 3 refills | Status: DC

## 2016-11-26 MED ORDER — RAMIPRIL 10 MG PO CAPS: 10.0000 mg | ORAL_CAPSULE | Freq: Every day | ORAL | 3 refills | Status: DC

## 2016-11-26 MED ORDER — SIMVASTATIN 40 MG PO TABS: 40.0000 mg | ORAL_TABLET | Freq: Every evening | ORAL | 3 refills | Status: DC

## 2016-11-26 MED ORDER — OMEPRAZOLE 20 MG PO CPDR: 20.0000 mg | DELAYED_RELEASE_CAPSULE | Freq: Every day | ORAL | 3 refills | Status: DC

## 2016-11-26 NOTE — Telephone Encounter (Signed)
[name], just to clarify- does pt need to continue receiving b12 through mail order pharmacy, or can we now supply pt with b12?  Thanks.

## 2016-11-26 NOTE — Telephone Encounter (Signed)
Spoke with pt's daughter Freda Munro, needs rx's ordered by SN to be changed from [facility] to [facility].  These have been sent.  Also, pt's daughter states that pt has changed insurance and now Medicare is primary- because of this, daughter states pt should be able to get vit b12 vials through our office instead of them mailed to him and brought in to the clinic.  I advised that I would forward this to our allergy team lead for follow up as I'm not familiar with our [facility] policy.  KW please advise if pt needs to continue getting b12 vials through mail order pharmacy, or if we can provide this to pt.  Thanks.

## 2016-11-26 NOTE — Telephone Encounter (Signed)
Spoke with pt's daughter Freda Munro, aware of recs.  Forwarding to [employer] to make aware of vit B12 changes.

## 2016-11-26 NOTE — Telephone Encounter (Signed)
Yes we can continue to give patient Vit B 12 in our office; please inform Alroy Bailiff of this change so she can updates made to injection sheet.

## 2016-11-26 NOTE — Telephone Encounter (Signed)
We can supply patient his Vit B12 injections.

## 2016-11-27 NOTE — Telephone Encounter (Signed)
Called [facility] and spoke with Elberta Fortis, requesting a form to be filled out regarding pt's Plavix.  I asked if this could be done over the phone but was told it could not.  Verified fax #, will wait for fax.

## 2016-11-27 NOTE — Telephone Encounter (Signed)
[facility] call a/b script for plavix please advise they can be reached @ [phone] opt 1 ref # [id number].Hillery Hunter

## 2016-11-28 NOTE — Telephone Encounter (Signed)
I attached a note to the B-12 flow sheet to remind me to put on the charge sheet we are supplying his med. Again. Nothing further needed for my part of the note.

## 2016-11-29 ENCOUNTER — Ambulatory Visit (INDEPENDENT_AMBULATORY_CARE_PROVIDER_SITE_OTHER): Payer: Medicare Other

## 2016-11-29 DIAGNOSIS — E538 Deficiency of other specified B group vitamins: Secondary | ICD-10-CM | POA: Diagnosis not present

## 2016-11-30 MED ORDER — CYANOCOBALAMIN 1000 MCG/ML IJ SOLN
1000.0000 ug | Freq: Once | INTRAMUSCULAR | Status: AC
  Administered 2016-11-29: 1000 ug via INTRAMUSCULAR

## 2016-11-30 NOTE — Progress Notes (Signed)
Vitamin B12 injection documentation and charges entered by Stevana Dufner, RMA, based on injection sheet filled out by Tammy Scott per office protocol.   

## 2016-12-03 ENCOUNTER — Ambulatory Visit: Payer: BLUE CROSS/BLUE SHIELD

## 2016-12-03 ENCOUNTER — Ambulatory Visit: Payer: BLUE CROSS/BLUE SHIELD | Admitting: Radiation Oncology

## 2016-12-04 ENCOUNTER — Encounter: Payer: Self-pay | Admitting: Radiation Oncology

## 2016-12-04 ENCOUNTER — Encounter (HOSPITAL_COMMUNITY)
Admission: RE | Admit: 2016-12-04 | Discharge: 2016-12-04 | Disposition: A | Discharge status: Home / Self Care | Payer: Medicare Other | Source: Ambulatory Visit | Attending: Urology | Admitting: Urology

## 2016-12-04 DIAGNOSIS — C61 Malignant neoplasm of prostate: Secondary | ICD-10-CM | POA: Diagnosis not present

## 2016-12-04 DIAGNOSIS — R972 Elevated prostate specific antigen [PSA]: Secondary | ICD-10-CM | POA: Diagnosis not present

## 2016-12-04 MED ORDER — TECHNETIUM TC 99M MEDRONATE IV KIT
20.7000 | PACK | Freq: Once | INTRAVENOUS | Status: AC | PRN
  Administered 2016-12-04: 20.7 via INTRAVENOUS

## 2016-12-04 NOTE — Progress Notes (Addendum)
GU Location of Tumor / Histology: prostatic adenocarcinoma  If Prostate Cancer, Gleason Score is (4 + 4) and PSA is (26). Prostate volume: 28 cc.   [name] PSA History: Reports PCP, [name] referred him to [name] for an evaluation of his elevated PSA. Reports his PSA didn't begin to rise until he was 81. 3/18 PSA 26.9 9/17 PSA 11.8  10/16 PSA 9.45  Biopsies of prostate (if applicable) revealed:    Past/Anticipated interventions by urology, if any: biopsy, discussion of treatment options to include ADT, referral to [name]. Reports hormone injections have been discussed but, he hasn't received any.  Past/Anticipated interventions by medical oncology, if any: no  Weight changes, if any: denies  Bowel/Bladder complaints, if any: IPSS 6. Denies dysuria, hematuria, leakage, or incontinence.   Nausea/Vomiting, if any: denies  Pain issues, if any:  Chronic left shoulder pain.   SAFETY ISSUES:  Prior radiation? denies  Pacemaker/ICD? Yes, pacemaker and defibrillator. Reports [name] is his cardiologist.  Possible current pregnancy? no  Is the patient on methotrexate? denies  Current Complaints / other details:  83 year old male. Worked 3 12 hours shifts at [employer] until about 3 months ago when they closed up.

## 2016-12-05 ENCOUNTER — Ambulatory Visit
Admission: RE | Admit: 2016-12-05 | Discharge: 2016-12-05 | Disposition: A | Discharge status: Home / Self Care | Payer: Medicare Other | Source: Ambulatory Visit | Attending: Radiation Oncology | Admitting: Radiation Oncology

## 2016-12-05 ENCOUNTER — Encounter: Payer: Self-pay | Admitting: Radiation Oncology

## 2016-12-05 DIAGNOSIS — Z87891 Personal history of nicotine dependence: Secondary | ICD-10-CM | POA: Diagnosis not present

## 2016-12-05 DIAGNOSIS — C61 Malignant neoplasm of prostate: Secondary | ICD-10-CM

## 2016-12-05 DIAGNOSIS — Z8679 Personal history of other diseases of the circulatory system: Secondary | ICD-10-CM | POA: Diagnosis not present

## 2016-12-05 DIAGNOSIS — R972 Elevated prostate specific antigen [PSA]: Secondary | ICD-10-CM | POA: Diagnosis not present

## 2016-12-05 HISTORY — DX: Malignant neoplasm of prostate: C61

## 2016-12-05 NOTE — Progress Notes (Signed)
Radiation Oncology         [phone] ________________________________  Initial Outpatient Consultation  Name: [name] MRN: [id number]  Date: 12/05/2016  DOB: 1932-02-22  TI:WPYKD,XIPJA M, MD  Ardis Hughs, MD   REFERRING PHYSICIAN: Ardis Hughs, MD  DIAGNOSIS: 83 y.o. gentleman with Stage T1c adenocarcinoma of the prostate with Gleason Score of 4+4, and PSA of 26.90.     ICD-9-CM ICD-10-CM   1. Malignant neoplasm of prostate ([place]) 185 C61     HISTORY OF PRESENT ILLNESS: [name] is a 83 y.o. male with a diagnosis of prostate cancer. He presents today with his 2 daughters.  He was noted to have an elevated PSA of 12.82 by his primary care physician, Dr. Lenna Gilford.  Accordingly, he was referred for evaluation in urology by Dr. Louis Meckel on 10/21/15,  digital rectal examination was performed at that time revealing a symmetrical prostate without discrete nodules.  The patient was offered TRUSPBx at that time but declined due to the fact that he would not want to pursue treatment even if diagnosis was made.  Of note, he has a significant cardiac history including multiple MIs requiring cardiac stenting.  His last EF was 55-60% in 2015.  He also has aortic stenosis but is otherwise healthy and just recently retired in 07/2016 from working 3 twelve hour shifts at [employer].  The patient proceeded to transrectal ultrasound with 12 biopsies of the prostate on 11/06/16.  The prostate volume measured 25 cc.  Out of 12 core biopsies,11 were positive.  The maximum Gleason score was 4+4, and this was seen in left mid, left mid lateral, left apex, left apex lateral, right apex, right base and right base lateral.  Additionally, there was Gleason 4+3 disease noted in the left base, right mid, right mid lateral and right apex lateral.  PSA trend: 3.47 09/09/2013 5.27 09/2014 10.01 04/22/15 9.45 07/15/15 12.82 09/09/15 11.80 04/20/16 26.9 10/2016  CT pelvis was performed on 12/04/16 and  revealed NO evidence of metastatic disease.  NM Bone scan on 12/04/16 showed scattered degenerative changes but NO convincing evidence of bony metastatic disease.  The patient reviewed the biopsy results with his urologist and he has kindly been referred today for discussion of potential radiation treatment options.  PREVIOUS RADIATION THERAPY: No  PAST MEDICAL HISTORY:  Past Medical History:  Diagnosis Date  . Anemia, unspecified   . Aortic stenosis    Mild, echo, October, 2011  . AV block, complete (HCC)    Permanent pacemaker  . CAD (coronary artery disease)    DES to LAD 2005 /  DES to LAD 2008  . Cardiomyopathy    Ischemic, EF 30%, 2005  . DJD (degenerative joint disease)   . Dyslipidemia   . Ejection fraction < 50%    EF 30%, echo, 2005  /  EF 30%, catheterization, 2008  . GERD (gastroesophageal reflux disease)   . Hyperlipidemia   . Hypertension   . ICD (implantable cardiac defibrillator), single, in situ    for syncope and arrhythmia 2005 / generator change June, 2011  . LBBB (left bundle branch block)   . Mitral regurgitation    Mild, echo, October, 2011  . Prostate cancer ([place])   . Rectal fissure   . Syncope    2005, ICD placed  . Systolic CHF, chronic ([place])   . Vitamin B12 deficiency       PAST SURGICAL HISTORY: Past Surgical History:  Procedure Laterality Date  .  CARDIAC CATHETERIZATION  2008  . icd implanted  2005  . LAPAROSCOPIC CHOLECYSTECTOMY  04/2005   Dr. Lucia Gaskins  . ROTATOR CUFF REPAIR      FAMILY HISTORY:  Family History  Problem Relation Age of Onset  . Stroke Mother   . Cancer Mother 61    unknown  . Heart attack Father 11  . Cancer Brother     lung  . Cancer Sister     lung  . Stroke Brother   . Lung cancer Brother   . Cancer Maternal Aunt     breast    SOCIAL HISTORY:  Social History   Social History  . Marital status: Widowed    Spouse name: N/A  . Number of children: 2  . Years of education: N/A   Occupational History    . repairs looms at [employer]    Social History Main Topics  . Smoking status: Former Smoker    Packs/day: 1.00    Years: 2.00    Types: Cigarettes    Quit date: 08/06/1952  . Smokeless tobacco: Never Used  . Alcohol use No  . Drug use: No  . Sexual activity: Not Currently   Other Topics Concern  . Not on file   Social History Narrative  . No narrative on file    ALLERGIES: Patient has no known allergies.  MEDICATIONS:  Current Outpatient Prescriptions  Medication Sig Dispense Refill  . aspirin 81 MG tablet Take 81 mg by mouth daily.      . clopidogrel (PLAVIX) 75 MG tablet Take 1 tablet (75 mg total) by mouth daily. 30 tablet 0  . cyanocobalamin (,VITAMIN B-12,) 1000 MCG/ML injection INJECT 1 ML INTO THE MUSCLE EVERY 30 DAYS 1 mL 5  . fish oil-omega-3 fatty acids 1000 MG capsule Take 2 g by mouth daily.      Marland Kitchen gemfibrozil (LOPID) 600 MG tablet Take 1 tablet (600 mg total) by mouth daily. 90 tablet 3  . metoprolol succinate (TOPROL-XL) 100 MG 24 hr tablet Take 1 tablet (100 mg total) by mouth daily. Take with or immediately following a meal. 90 tablet 3  . Multiple Vitamins-Minerals (MENS MULTIVITAMIN PLUS PO) Take 1 tablet by mouth daily.      Marland Kitchen omeprazole (PRILOSEC) 20 MG capsule Take 1 capsule (20 mg total) by mouth daily. Take 30 minutes before a meal 90 capsule 3  . Polyethylene Glycol POWD Take 1 scoop by mouth daily.     . ramipril (ALTACE) 10 MG capsule Take 1 capsule (10 mg total) by mouth daily. 90 capsule 3  . simvastatin (ZOCOR) 40 MG tablet Take 1 tablet (40 mg total) by mouth every evening. 90 tablet 3   No current facility-administered medications for this encounter.     REVIEW OF SYSTEMS:  On review of systems, the patient reports that he is doing well overall. He denies any chest pain, shortness of breath, cough, fevers, chills, night sweats, unintended weight changes. He remains very active and enjoys mowing his daughters many acre property in [place], [place].  He  lives independently in [place] and is widowed.  Pt endorses use of Miralax for chronic constipation, but denies abdominal pain, nausea or vomiting. He denies any new musculoskeletal or joint aches or pains. His IPSS was 6, indicating mild urinary symptoms. He is not sexually active. A complete review of systems is obtained and is otherwise negative.  PHYSICAL EXAM:  Wt Readings from Last 3 Encounters:  12/05/16 171 lb 9.6 oz (  77.8 kg)  10/16/16 170 lb (77.1 kg)  09/13/16 162 lb 8 oz (73.7 kg)   Temp Readings from Last 3 Encounters:  12/05/16 97.8 F (36.6 C) (Oral)  09/13/16 97.2 F (36.2 C) (Oral)  03/13/16 97.4 F (36.3 C) (Oral)   BP Readings from Last 3 Encounters:  12/05/16 (!) 153/72  10/16/16 (!) 142/70  09/13/16 140/80   Pulse Readings from Last 3 Encounters:  12/05/16 (!) 50  10/16/16 (!) 50  09/13/16 (!) 50   Pain Assessment Pain Score: 0-No pain/10  In general this is a well appearing caucasian male in no acute distress. He is alert and oriented x4 and appropriate throughout the examination. HEENT reveals that the patient is normocephalic, atraumatic. EOMs are intact. PERRLA. Skin is intact without any evidence of gross lesions. Cardiovascular exam reveals a regular rate and rhythm, no clicks rubs or murmurs are auscultated. Chest is clear to auscultation bilaterally. Lymphatic assessment is performed and does not reveal any adenopathy in the cervical, supraclavicular, axillary, or inguinal chains. Abdomen has active bowel sounds in all quadrants and is intact. The abdomen is soft, non tender, non distended. Lower extremities are negative for pretibial pitting edema, deep calf tenderness, cyanosis or clubbing.   KPS = 100  100 - Normal; no complaints; no evidence of disease. 90   - Able to carry on normal activity; minor signs or symptoms of disease. 80   - Normal activity with effort; some signs or symptoms of disease. 50   - Cares for self; unable to carry on  normal activity or to do active work. 60   - Requires occasional assistance, but is able to care for most of his personal needs. 50   - Requires considerable assistance and frequent medical care. 61   - Disabled; requires special care and assistance. 30   - Severely disabled; hospital admission is indicated although death not imminent. 2   - Very sick; hospital admission necessary; active supportive treatment necessary. 10   - Moribund; fatal processes progressing rapidly. 0     - Dead  Karnofsky DA, Abelmann [place], Craver LS and Burchenal [facility] [phone]) The use of the nitrogen mustards in the palliative treatment of carcinoma: with particular reference to bronchogenic carcinoma Cancer 1 634-56  LABORATORY DATA:  Lab Results  Component Value Date   WBC 5.7 09/17/2016   HGB 12.0 (L) 09/17/2016   HCT 34.5 (L) 09/17/2016   MCV 93.1 09/17/2016   PLT 187.0 09/17/2016   Lab Results  Component Value Date   NA 142 09/17/2016   K 4.1 09/17/2016   CL 107 09/17/2016   CO2 29 09/17/2016   Lab Results  Component Value Date   ALT 14 09/17/2016   AST 23 09/17/2016   ALKPHOS 48 09/17/2016   BILITOT 1.2 09/17/2016     RADIOGRAPHY: Nm Bone Scan Whole Body  Result Date: 12/04/2016 CLINICAL DATA:  Left shoulder pain.  Elevated PSA. EXAM: NUCLEAR MEDICINE WHOLE BODY BONE SCAN TECHNIQUE: Whole body anterior and posterior images were obtained approximately 3 hours after intravenous injection of radiopharmaceutical. RADIOPHARMACEUTICALS:  20.7 mCi Technetium-62m MDP IV COMPARISON:  CT scan from today FINDINGS: No abnormal uptake in the left shoulder to explain the patient's symptoms. There are degenerative changes in the shoulders. Degenerative changes are seen in the feet, knees, bilateral first MCP joints, and sternoclavicular joints. Degenerative changes are seen in the spine. Mild uptake projected over the right SI joint is likely degenerative with no focal lesions in this  region on today's CT scan. Minimal  uptake in the anterior inferior left SI joint region is likely also degenerative given findings on the CT scan from today. There is no convincing evidence of bony metastatic disease on today's study. IMPRESSION: Scattered degenerative changes. No convincing evidence of bony metastatic disease on this study. Electronically Signed   By: Dorise Bullion III M.D   On: 12/04/2016 15:13      IMPRESSION/PLAN: 1. 83 y.o. gentleman with Stage T1c adenocarcinoma of the prostate with Gleason Score of 4+4, and PSA of 26.90.  We discussed the patient's workup and outlines the nature of prostate cancer in this setting. The patient's T stage, Gleason's score, and PSA put him into the high risk group. Accordingly he is eligible for a variety of potential treatment options including external radiation with LT ADT or external radiation and brachytherapy boost with ADT. We discussed the available radiation techniques, and focused on the details of logistics and delivery. The patient is a candidate for brachytherapy boost with a prostate volume of 25 cc, however, given his cardiac history, this would be a moderate-high risk procedure. We discussed and outlined the risks, benefits, short and long-term effects associated with radiotherapy.   At the end of the conversation the patient is interested in moving forward with 8 weeks of external radiotherapy with ADT.  We would anticipate a 2 year course of ADT with plans to initiate the ADT 2 months prior to starting radiotherapy. He has not received any ADT at this point, and has not had placement of gold fiducial markers. We will contact Dr. Louis Meckel at [facility] urology to make arrangements for fiducial marker placement and initiation of ADT prior to simulation. He will be scheduled for simulation in the next 6-8 weeks. We will share our discussion with Dr. Louis Meckel and move forward with scheduling and appointment to start ADT and for placement of 3 gold fiducial markers into the  prostate to proceed with IMRT in the near future.  Patient is hard of hearing so he prefers all communication regarding appointment scheduling be coordinated through his daughter Azucena Cecil- [phone] or [phone].    We spent 60 minutes face to face with the patient and his 2 daughters and more than 50% of that time was spent in counseling and/or coordination of care.   Nicholos Johns, PA-C    Tyler Pita, MD  [facility] Direct Dial: [phone]  Fax: [phone] [email or web address]  Skype  [email or web address]   This document serves as a record of services personally performed by Tyler Pita, MD. It was created on his behalf by Linward Natal, a trained medical scribe. The creation of this record is based on the scribe's personal observations and the provider's statements to them. This document has been checked and approved by the attending provider.

## 2016-12-05 NOTE — Progress Notes (Signed)
See progress note under physician encounter. 

## 2016-12-06 ENCOUNTER — Telehealth: Payer: Self-pay | Admitting: *Deleted

## 2016-12-06 NOTE — Telephone Encounter (Signed)
Called patient's daughter Freda Munro to inform of ADT and gold seed placement appt. for May 7 - arrival time - 9:45 am @ Dr. Carlton Adam Office, spoke with patient's daughter - Azucena Cecil and she is aware of this appt.

## 2016-12-10 ENCOUNTER — Telehealth: Payer: Self-pay | Admitting: Radiation Oncology

## 2016-12-10 NOTE — Telephone Encounter (Signed)
Faxed PACEMAKER/ICD form to [facility] at [phone]. Fax confirmation of delivery obtained.

## 2016-12-14 ENCOUNTER — Telehealth: Payer: Self-pay | Admitting: Pulmonary Disease

## 2016-12-14 MED ORDER — CLOPIDOGREL BISULFATE 75 MG PO TABS: 75.0000 mg | ORAL_TABLET | Freq: Every day | ORAL | 4 refills | Status: DC

## 2016-12-14 NOTE — Telephone Encounter (Signed)
Me and Leigh verified in chart that pt never got his last rx for the [facility]. Spoke with daughter and sent in [facility] per her request. She had no further questions. Nothing further is needed

## 2016-12-17 DIAGNOSIS — C61 Malignant neoplasm of prostate: Secondary | ICD-10-CM | POA: Diagnosis not present

## 2016-12-18 ENCOUNTER — Telehealth: Payer: Self-pay | Admitting: *Deleted

## 2016-12-18 NOTE — Telephone Encounter (Signed)
Called patient to inform of sim being moved to 02-01-17 @ 3 pm with Dr. Tammi Klippel, spoke with patient and he is aware of this appt.

## 2016-12-21 ENCOUNTER — Ambulatory Visit: Payer: Medicare Other | Admitting: Radiation Oncology

## 2016-12-27 ENCOUNTER — Ambulatory Visit (INDEPENDENT_AMBULATORY_CARE_PROVIDER_SITE_OTHER): Payer: Medicare Other

## 2016-12-27 DIAGNOSIS — E538 Deficiency of other specified B group vitamins: Secondary | ICD-10-CM | POA: Diagnosis not present

## 2016-12-28 DIAGNOSIS — E538 Deficiency of other specified B group vitamins: Secondary | ICD-10-CM | POA: Diagnosis not present

## 2016-12-28 MED ORDER — CYANOCOBALAMIN 1000 MCG/ML IJ SOLN
1000.0000 ug | Freq: Once | INTRAMUSCULAR | Status: AC
  Administered 2016-12-28: 1000 ug via INTRAMUSCULAR

## 2017-01-11 ENCOUNTER — Ambulatory Visit: Payer: BLUE CROSS/BLUE SHIELD | Admitting: Internal Medicine

## 2017-01-17 ENCOUNTER — Ambulatory Visit (INDEPENDENT_AMBULATORY_CARE_PROVIDER_SITE_OTHER): Payer: Medicare Other | Admitting: *Deleted

## 2017-01-17 DIAGNOSIS — I255 Ischemic cardiomyopathy: Secondary | ICD-10-CM | POA: Diagnosis not present

## 2017-01-17 NOTE — Progress Notes (Signed)
Remote ICD transmission.   

## 2017-01-18 ENCOUNTER — Encounter: Payer: Self-pay | Admitting: Cardiology

## 2017-01-22 LAB — CUP PACEART REMOTE DEVICE CHECK
Battery Remaining Percentage: 24 %
Battery Voltage: 2.8 V
Brady Statistic AP VP Percent: 56 %
Brady Statistic AP VS Percent: 1 %
Brady Statistic AS VP Percent: 43 %
Brady Statistic AS VS Percent: 1 %
Brady Statistic RV Percent Paced: 99 %
Date Time Interrogation Session: 20180614093829
HighPow Impedance: 40 Ohm
Implantable Lead Implant Date: 20050509
Implantable Lead Location: 753860
Implantable Lead Model: 1580
Lead Channel Impedance Value: 390 Ohm
Lead Channel Pacing Threshold Amplitude: 1.125 V
Lead Channel Pacing Threshold Pulse Width: 0.5 ms
Lead Channel Sensing Intrinsic Amplitude: 1.6 mV
Lead Channel Setting Pacing Amplitude: 2 V
Lead Channel Setting Pacing Pulse Width: 0.5 ms
Lead Channel Setting Sensing Sensitivity: 0.5 mV
MDC IDC LEAD IMPLANT DT: 20050509
MDC IDC LEAD LOCATION: 753859
MDC IDC MSMT BATTERY REMAINING LONGEVITY: 23 mo
MDC IDC MSMT LEADCHNL RA PACING THRESHOLD AMPLITUDE: 0.75 V
MDC IDC MSMT LEADCHNL RA PACING THRESHOLD PULSEWIDTH: 0.5 ms
MDC IDC MSMT LEADCHNL RV IMPEDANCE VALUE: 340 Ohm
MDC IDC MSMT LEADCHNL RV SENSING INTR AMPL: 10.2 mV
MDC IDC PG IMPLANT DT: 20110609
MDC IDC PG SERIAL: 610170
MDC IDC SET LEADCHNL RV PACING AMPLITUDE: 2.125
MDC IDC STAT BRADY RA PERCENT PACED: 56 %

## 2017-01-31 ENCOUNTER — Ambulatory Visit (INDEPENDENT_AMBULATORY_CARE_PROVIDER_SITE_OTHER): Payer: Medicare Other

## 2017-01-31 DIAGNOSIS — E538 Deficiency of other specified B group vitamins: Secondary | ICD-10-CM | POA: Diagnosis not present

## 2017-02-01 ENCOUNTER — Encounter: Payer: Self-pay | Admitting: Medical Oncology

## 2017-02-01 ENCOUNTER — Ambulatory Visit
Admission: RE | Admit: 2017-02-01 | Discharge: 2017-02-01 | Disposition: A | Discharge status: Home / Self Care | Payer: Medicare Other | Source: Ambulatory Visit | Attending: Radiation Oncology | Admitting: Radiation Oncology

## 2017-02-01 DIAGNOSIS — C61 Malignant neoplasm of prostate: Secondary | ICD-10-CM | POA: Diagnosis not present

## 2017-02-01 NOTE — Progress Notes (Signed)
  Radiation Oncology         [phone] ________________________________  Name: [name] MRN: [id number]  Date: 02/01/2017  DOB: April 06, 1932  SIMULATION AND TREATMENT PLANNING NOTE    ICD-10-CM   1. Malignant neoplasm of prostate ([place]) C61     DIAGNOSIS:  83 y.o. gentleman with Stage T1c adenocarcinoma of the prostate with Gleason Score of 4+4, and PSA of 26.90  NARRATIVE:  The patient was brought to the [facility].  Identity was confirmed.  All relevant records and images related to the planned course of therapy were reviewed.  The patient freely provided informed written consent to proceed with treatment after reviewing the details related to the planned course of therapy. The consent form was witnessed and verified by the simulation staff.  Then, the patient was set-up in a stable reproducible supine position for radiation therapy.  A vacuum lock pillow device was custom fabricated to position his legs in a reproducible immobilized position.  Then, I performed a urethrogram under sterile conditions to identify the prostatic apex.  CT images were obtained.  Surface markings were placed.  The CT images were loaded into the planning software.  Then the prostate target and avoidance structures including the rectum, bladder, bowel and hips were contoured.  Treatment planning then occurred.  The radiation prescription was entered and confirmed.  A total of one complex treatment devices were fabricated. I have requested : Intensity Modulated Radiotherapy (IMRT) is medically necessary for this case for the following reason:  Rectal sparing.Marland Kitchen  PLAN:  The patient will receive 45 Gy in 25 fractions of 1.8 Gy, followed by a boost to the prostate to a total dose of 75 Gy with 15 additional fractions of 2.0 Gy.  ________________________________  Sheral Apley Tammi Klippel, M.D.

## 2017-02-01 NOTE — Progress Notes (Signed)
I met Mr. Canner and his daughter. I introduced myself as the prostate nurse navigator and my role. He will begin radiation 02/12/17 and I will continue to follow.

## 2017-02-04 MED ORDER — CYANOCOBALAMIN 1000 MCG/ML IJ SOLN
1000.0000 ug | Freq: Once | INTRAMUSCULAR | Status: AC
  Administered 2017-01-31: 1000 ug via INTRAMUSCULAR

## 2017-02-04 NOTE — Progress Notes (Signed)
Documentation of medication administration and charges of Vitamin B12 have been completed by Desmond Dike, CMA based on the Vitamin b12 documentation sheet completed by [facility].

## 2017-02-11 DIAGNOSIS — C61 Malignant neoplasm of prostate: Secondary | ICD-10-CM | POA: Diagnosis not present

## 2017-02-13 ENCOUNTER — Ambulatory Visit
Admission: RE | Admit: 2017-02-13 | Discharge: 2017-02-13 | Disposition: A | Discharge status: Home / Self Care | Payer: Medicare Other | Source: Ambulatory Visit | Attending: Radiation Oncology | Admitting: Radiation Oncology

## 2017-02-13 DIAGNOSIS — C61 Malignant neoplasm of prostate: Secondary | ICD-10-CM | POA: Diagnosis not present

## 2017-02-14 ENCOUNTER — Ambulatory Visit
Admission: RE | Admit: 2017-02-14 | Discharge: 2017-02-14 | Disposition: A | Discharge status: Home / Self Care | Payer: Medicare Other | Source: Ambulatory Visit | Attending: Radiation Oncology | Admitting: Radiation Oncology

## 2017-02-14 DIAGNOSIS — C61 Malignant neoplasm of prostate: Secondary | ICD-10-CM | POA: Diagnosis not present

## 2017-02-14 NOTE — Progress Notes (Signed)
Pt here for patient teaching.  Pt given Radiation and You booklet.  Reviewed areas of pertinence such as diarrhea, fatigue, hair loss, sexual and fertility changes, skin changes and urinary and bladder changes . Pt able to give teach back of to pat skin, use unscented/gentle soap, use baby wipes and drink plenty of water,avoid applying anything to skin within 4 hours of treatment. Pt demonstrated understanding and verbalizes understanding of information given and will contact nursing with any questions or concerns.

## 2017-02-15 ENCOUNTER — Ambulatory Visit
Admission: RE | Admit: 2017-02-15 | Discharge: 2017-02-15 | Disposition: A | Discharge status: Home / Self Care | Payer: Medicare Other | Source: Ambulatory Visit | Attending: Radiation Oncology | Admitting: Radiation Oncology

## 2017-02-15 DIAGNOSIS — C61 Malignant neoplasm of prostate: Secondary | ICD-10-CM | POA: Diagnosis not present

## 2017-02-18 ENCOUNTER — Ambulatory Visit
Admission: RE | Admit: 2017-02-18 | Discharge: 2017-02-18 | Disposition: A | Discharge status: Home / Self Care | Payer: Medicare Other | Source: Ambulatory Visit | Attending: Radiation Oncology | Admitting: Radiation Oncology

## 2017-02-18 DIAGNOSIS — C61 Malignant neoplasm of prostate: Secondary | ICD-10-CM | POA: Diagnosis not present

## 2017-02-19 ENCOUNTER — Ambulatory Visit
Admission: RE | Admit: 2017-02-19 | Discharge: 2017-02-19 | Disposition: A | Discharge status: Home / Self Care | Payer: Medicare Other | Source: Ambulatory Visit | Attending: Radiation Oncology | Admitting: Radiation Oncology

## 2017-02-19 DIAGNOSIS — C61 Malignant neoplasm of prostate: Secondary | ICD-10-CM | POA: Diagnosis not present

## 2017-02-20 ENCOUNTER — Ambulatory Visit
Admission: RE | Admit: 2017-02-20 | Discharge: 2017-02-20 | Disposition: A | Discharge status: Home / Self Care | Payer: Medicare Other | Source: Ambulatory Visit | Attending: Radiation Oncology | Admitting: Radiation Oncology

## 2017-02-20 DIAGNOSIS — C61 Malignant neoplasm of prostate: Secondary | ICD-10-CM | POA: Diagnosis not present

## 2017-02-21 ENCOUNTER — Ambulatory Visit
Admission: RE | Admit: 2017-02-21 | Discharge: 2017-02-21 | Disposition: A | Discharge status: Home / Self Care | Payer: Medicare Other | Source: Ambulatory Visit | Attending: Radiation Oncology | Admitting: Radiation Oncology

## 2017-02-21 DIAGNOSIS — C61 Malignant neoplasm of prostate: Secondary | ICD-10-CM | POA: Diagnosis not present

## 2017-02-22 ENCOUNTER — Ambulatory Visit
Admission: RE | Admit: 2017-02-22 | Discharge: 2017-02-22 | Disposition: A | Discharge status: Home / Self Care | Payer: Medicare Other | Source: Ambulatory Visit | Attending: Radiation Oncology | Admitting: Radiation Oncology

## 2017-02-22 DIAGNOSIS — C61 Malignant neoplasm of prostate: Secondary | ICD-10-CM | POA: Diagnosis not present

## 2017-02-25 ENCOUNTER — Ambulatory Visit
Admission: RE | Admit: 2017-02-25 | Discharge: 2017-02-25 | Disposition: A | Discharge status: Home / Self Care | Payer: Medicare Other | Source: Ambulatory Visit | Attending: Radiation Oncology | Admitting: Radiation Oncology

## 2017-02-25 DIAGNOSIS — C61 Malignant neoplasm of prostate: Secondary | ICD-10-CM | POA: Diagnosis not present

## 2017-02-26 ENCOUNTER — Ambulatory Visit
Admission: RE | Admit: 2017-02-26 | Discharge: 2017-02-26 | Disposition: A | Discharge status: Home / Self Care | Payer: Medicare Other | Source: Ambulatory Visit | Attending: Radiation Oncology | Admitting: Radiation Oncology

## 2017-02-26 ENCOUNTER — Encounter: Payer: Self-pay | Admitting: Medical Oncology

## 2017-02-26 DIAGNOSIS — C61 Malignant neoplasm of prostate: Secondary | ICD-10-CM | POA: Diagnosis not present

## 2017-02-27 ENCOUNTER — Ambulatory Visit
Admission: RE | Admit: 2017-02-27 | Discharge: 2017-02-27 | Disposition: A | Discharge status: Home / Self Care | Payer: Medicare Other | Source: Ambulatory Visit | Attending: Radiation Oncology | Admitting: Radiation Oncology

## 2017-02-27 DIAGNOSIS — C61 Malignant neoplasm of prostate: Secondary | ICD-10-CM | POA: Diagnosis not present

## 2017-02-28 ENCOUNTER — Ambulatory Visit (INDEPENDENT_AMBULATORY_CARE_PROVIDER_SITE_OTHER): Payer: Medicare Other

## 2017-02-28 ENCOUNTER — Ambulatory Visit
Admission: RE | Admit: 2017-02-28 | Discharge: 2017-02-28 | Disposition: A | Discharge status: Home / Self Care | Payer: Medicare Other | Source: Ambulatory Visit | Attending: Radiation Oncology | Admitting: Radiation Oncology

## 2017-02-28 DIAGNOSIS — E538 Deficiency of other specified B group vitamins: Secondary | ICD-10-CM | POA: Diagnosis not present

## 2017-02-28 DIAGNOSIS — C61 Malignant neoplasm of prostate: Secondary | ICD-10-CM | POA: Diagnosis not present

## 2017-03-01 ENCOUNTER — Ambulatory Visit
Admission: RE | Admit: 2017-03-01 | Discharge: 2017-03-01 | Disposition: A | Discharge status: Home / Self Care | Payer: Medicare Other | Source: Ambulatory Visit | Attending: Radiation Oncology | Admitting: Radiation Oncology

## 2017-03-01 DIAGNOSIS — C61 Malignant neoplasm of prostate: Secondary | ICD-10-CM | POA: Diagnosis not present

## 2017-03-01 MED ORDER — CYANOCOBALAMIN 1000 MCG/ML IJ SOLN
1000.0000 ug | Freq: Once | INTRAMUSCULAR | Status: AC
  Administered 2017-02-28: 1000 ug via INTRAMUSCULAR

## 2017-03-04 ENCOUNTER — Ambulatory Visit
Admission: RE | Admit: 2017-03-04 | Discharge: 2017-03-04 | Disposition: A | Discharge status: Home / Self Care | Payer: Medicare Other | Source: Ambulatory Visit | Attending: Radiation Oncology | Admitting: Radiation Oncology

## 2017-03-04 ENCOUNTER — Telehealth: Payer: Self-pay | Admitting: Pulmonary Disease

## 2017-03-04 DIAGNOSIS — F419 Anxiety disorder, unspecified: Secondary | ICD-10-CM

## 2017-03-04 DIAGNOSIS — I10 Essential (primary) hypertension: Secondary | ICD-10-CM

## 2017-03-04 DIAGNOSIS — I350 Nonrheumatic aortic (valve) stenosis: Secondary | ICD-10-CM

## 2017-03-04 DIAGNOSIS — C61 Malignant neoplasm of prostate: Secondary | ICD-10-CM | POA: Diagnosis not present

## 2017-03-04 NOTE — Telephone Encounter (Signed)
Pt has an appointment coming up with SN on 03/13/2017. He is wanting to know if he needs labs prior to this appointment.  SN - please advise. Thanks.

## 2017-03-05 ENCOUNTER — Ambulatory Visit
Admission: RE | Admit: 2017-03-05 | Discharge: 2017-03-05 | Disposition: A | Discharge status: Home / Self Care | Payer: Medicare Other | Source: Ambulatory Visit | Attending: Radiation Oncology | Admitting: Radiation Oncology

## 2017-03-05 DIAGNOSIS — C61 Malignant neoplasm of prostate: Secondary | ICD-10-CM | POA: Diagnosis not present

## 2017-03-06 ENCOUNTER — Ambulatory Visit
Admission: RE | Admit: 2017-03-06 | Discharge: 2017-03-06 | Disposition: A | Discharge status: Home / Self Care | Payer: Medicare Other | Source: Ambulatory Visit | Attending: Radiation Oncology | Admitting: Radiation Oncology

## 2017-03-06 DIAGNOSIS — C61 Malignant neoplasm of prostate: Secondary | ICD-10-CM | POA: Diagnosis not present

## 2017-03-06 NOTE — Telephone Encounter (Signed)
Per SN--  [place] for labs and these have been placed into the computer for the pt.  I have called and made the pt aware. Nothing further is needed.

## 2017-03-07 ENCOUNTER — Other Ambulatory Visit (INDEPENDENT_AMBULATORY_CARE_PROVIDER_SITE_OTHER): Payer: Medicare Other

## 2017-03-07 ENCOUNTER — Ambulatory Visit
Admission: RE | Admit: 2017-03-07 | Discharge: 2017-03-07 | Disposition: A | Discharge status: Home / Self Care | Payer: Medicare Other | Source: Ambulatory Visit | Attending: Radiation Oncology | Admitting: Radiation Oncology

## 2017-03-07 DIAGNOSIS — I10 Essential (primary) hypertension: Secondary | ICD-10-CM | POA: Diagnosis not present

## 2017-03-07 DIAGNOSIS — F419 Anxiety disorder, unspecified: Secondary | ICD-10-CM | POA: Diagnosis not present

## 2017-03-07 DIAGNOSIS — C61 Malignant neoplasm of prostate: Secondary | ICD-10-CM | POA: Diagnosis not present

## 2017-03-07 LAB — BASIC METABOLIC PANEL
BUN: 13 mg/dL (ref 6–23)
CO2: 27 mEq/L (ref 19–32)
CREATININE: 1.04 mg/dL (ref 0.40–1.50)
Calcium: 9.5 mg/dL (ref 8.4–10.5)
Chloride: 107 mEq/L (ref 96–112)
GFR: 72.54 mL/min (ref >60.00)
Glucose, Bld: 98 mg/dL (ref 70–99)
Potassium: 4.3 mEq/L (ref 3.5–5.1)
Sodium: 142 mEq/L (ref 135–145)

## 2017-03-07 LAB — CBC WITH DIFFERENTIAL/PLATELET
BASOS ABS: 0 10*3/uL (ref 0.0–0.1)
Basophils Relative: 0.5 % (ref 0.0–3.0)
EOS PCT: 6.8 % — AB (ref 0.0–5.0)
Eosinophils Absolute: 0.2 10*3/uL (ref 0.0–0.7)
HCT: 27.9 % — ABNORMAL LOW (ref 39.0–52.0)
Hemoglobin: 10 g/dL — ABNORMAL LOW (ref 13.0–17.0)
LYMPHS ABS: 0.5 10*3/uL — AB (ref 0.7–4.0)
Lymphocytes Relative: 14.5 % (ref 12.0–46.0)
MCHC: 35.7 g/dL (ref 30.0–36.0)
MCV: 92.1 fl (ref 78.0–100.0)
MONO ABS: 0.5 10*3/uL (ref 0.1–1.0)
Monocytes Relative: 15.5 % — ABNORMAL HIGH (ref 3.0–12.0)
NEUTROS PCT: 62.7 % (ref 43.0–77.0)
Neutro Abs: 2 10*3/uL (ref 1.4–7.7)
Platelets: 162 10*3/uL (ref 150.0–400.0)
RBC: 3.03 Mil/uL — AB (ref 4.22–5.81)
RDW: 13 % (ref 11.5–15.5)
WBC: 3.2 10*3/uL — ABNORMAL LOW (ref 4.0–10.5)

## 2017-03-07 LAB — TSH: TSH: 7.1 u[IU]/mL — AB (ref 0.35–4.50)

## 2017-03-08 ENCOUNTER — Ambulatory Visit
Admission: RE | Admit: 2017-03-08 | Discharge: 2017-03-08 | Disposition: A | Discharge status: Home / Self Care | Payer: Medicare Other | Source: Ambulatory Visit | Attending: Radiation Oncology | Admitting: Radiation Oncology

## 2017-03-08 DIAGNOSIS — C61 Malignant neoplasm of prostate: Secondary | ICD-10-CM | POA: Diagnosis not present

## 2017-03-09 ENCOUNTER — Encounter: Payer: Self-pay | Admitting: Pulmonary Disease

## 2017-03-09 NOTE — Progress Notes (Signed)
Subjective:    Patient ID: [name], male    DOB: 1931-09-09, 83 y.o.   MRN: [id number]  HPI 83 y/o WM here for a follow up visit & review of mult medical problems... 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 ~  SEE PREV EPIC NOTES FOR OLDER DATA >>     2DEcho 10/11 showed EF=35-40% & mild HK  LABS 1/15:  FLP- ok on Simva40+Lopid600;  Chems- wnl;  CBC- ok w/ Hg=12.6;  TSH=3.78;  PSA=3.47...  LABS 6/15 & 7/15 showed> FLP- fair w/ LDL=104;  Chems- wnl;  CBC- ok x WBC=16.9 w/ 57%eos; f/u CBC 8/15 showed Hg=11.6, WBC=6.9 w/ 17%eos...  CXR 8/15 showed norm heart size, Pacer/ICD stable, stent noted, lungs clear, NAD...   ~   [id number]:  Elby was injured at work (fell pulling yarn out of a machine) & sustained fx rib, went to ER 08/14/14, note reviewed, XRay w/ left anterolat 6th rib fx, treated w/ rest/ heat/ Tramadol/ Tylenol/ etc...     CXR 08/14/14 showed norm heart size, Pacer/AICD on left, clear lungs w/ mild peribronch thickening, mult old rib fxs on left, NAD...   LABS 1/16:  FLP looks good on Simva40/Lopid600; Chems- wnl;  CBC- wnl;  TSH=5.40;  PSA=5.27...    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 We reviewed the following medical problems during today's office visit >>     HBP> on MetopER100AM, Altace10PM;  BP= 130/78 & he denies CP, palpit, dizzy, SOB, edema; states feeling well & energy is good...    CAD, Ischemic cardiomyop EF=35-40%, chr sys heart failure, AS & MR> on ASA/ Plavix; followed by Benay Spice & seen 7/16 (note reviewed); he's had prev MI & mult interventions, last 2DEcho 10/11 showed EF=35-40% & mild HK;  Repeat 2DEcho 7/16 showed decr LVF w/ EF=40-45%, AK of apex & septum, Gr1DD, paradox septal  motion, AoV sclerosis &mild AI, mild MV leaflet thickening (no change in meds).    Hx Syncope, AV block, Implanted Defib> followed by DrTaylor (seen 2/16- note reviewed) w/ his StJude biventricICD working normally) w/ pacer checks every 2-49mo he says; generator changed 6/11; he denies CP/angina, dizzy/ syncope.     Chol> on Simva40, Lopid600, FishOil; FLP 1/16 shows TChol 148, TG 111, HDL 45, LDL 81... No changes made, we stressed diet...    GI- GERD, constip> he had LapChole 2006; hx chr constip & anal stenosis- had eval by DrPatterson; cannot find reference to prev colonoscopy in Epic or [facility] but he says neg colon 2005 by DrPatterson; Rectal exam showed anal stenosis- dilated via anoscopy; he was treated w/ Linzess & Chronulac & AnusolHC and improved (now just using Miralax)...     GU- elev PSA> He saw DrHerrick & now follows w/ him for elev PSA- they are approaching the prob conservatively w/ PSA Q7mo & consider hormone therapy if it continues to rise...    DJD> he just uses Tylenol as needed...    VitB12 defic> on B12 shots monthly... We reviewed prob list, meds, xrays and labs> see below for updates >>   EKG 02/11/15 showed SBrady, rate50, AV sequential pacing...   2DEcho 7/16 showed decr LVF w/  EF=40-45%, AK of apex & septum, Gr1DD, paradox septal motion, AoV sclerosis &mild AI, mild MV leaflet thickening (no change in meds).  LABS 8/16:  Chems- wnl;  CBC- wnl;  TSH=4.07   ADDENDUM>  He saw KW 08/24/15 w/ sore throat & exam c/w candida/thrush- strep swab neg, treated w/ MMW & Diflucan, resolved...   ~  September 14, 2015:  47mo [name] reports doing well- still working 5d/wk for [employer];  We reviewed the following medical problems during today's office visit >>     HBP> on MetopER100AM, Altace10PM;  BP= 140/68 & he denies CP, palpit, dizzy, SOB, edema; states feeling well & energy is good...    CAD w/ MI & interventions, Ischemic cardiomyop EF=35-40%, chr sys heart  failure, AS & MR> on ASA/ Plavix; followed by DrKatz/Taylor & seen 7/16 (note reviewed); he's had prev MI & mult interventions, 2DEcho 10/11 showed EF=35-40% & mild HK;  Repeat 2DEcho 7/16 showed decr LVF w/ EF=40-45%, AK of apex & septum, Gr1DD, paradox septal motion, AoV sclerosis &mild AI, mild MV leaflet thickening (no change in meds).    Hx Syncope, AV block, Implanted Defib> followed by DrTaylor (seen 2/16- note reviewed) w/ his StJude biventricICD working normally) w/ pacer checks every 2-69mo he says; generator changed 6/11; he denies CP/angina, dizzy/ syncope.     Chol> on Simva40, Lopid600, FishOil; FLP 2/17 shows TChol 148, TG 55, HDL 53, LDL 85... No changes made, we stressed diet...    GI- GERD, constip> he had LapChole 2006; hx chr constip & anal stenosis- had eval by DrPatterson; Colonoscopy 2005 by DrPatterson was neg/ wnl; Rectal exam showed anal stenosis- dilated via anoscopy; he was treated w/ Linzess & Chronulac & AnusolHC and improved (now just using Miralax)...     GU- elev PSA> He saw DrHerrick & now follows w/ him for elev PSA- they are approaching the prob conservatively w/ PSA Q3-64mo & consider Bx & hormone therapy if it continues to rise; last seen 12/16- PSA =9.45, note reviewed...    DJD> he just uses Tylenol as needed...    VitB12 defic> on B12 shots monthly... EXAM shows Afeb, VSS, O2sat=98% on RA;  HEENT- neg, mallampati1;  Chest- clear w/o w/r/r;  Heart- RR w/o gr1/6SEM w/o r/g;  Abd- soft, neg;  Ext- +arthritic changes, w/o c/c/e;  Neuro- intact...  LABS 09/09/15> FLP- all parameters at goals;  Chem- wnl;  CBC- ok w/ Hg=12.0, MCV=94;  TSH-3.64;  PSA=12.82 & pt is encouraged to f/u w/ Urology- [place] ASAP... IMP/PLAN>>  [name] is clinically stable, still working 5d/wk, no new complaints or concerns;  He has HBP, CAD, cardiomyop w/ EF 40-45% & ICD in place- followed by DrTaylor;  He has a rising PSA followed by DrHerrick & given a copy of labs to take to Urology... Same  meds/ ROV 31mo...  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  We reviewed the following medical problems during today's office visit >>     HBP> on MetopER100AM, Altace10PM;  BP= 114/68 & he denies CP, palpit, dizzy, SOB, edema; states feeling well & energy is good...    CAD w/ MI & interventions, Ischemic cardiomyop decr EF, chr sys heart failure, AS & MR> on ASA/ Plavix; followed by DrMcAlhany/Taylor & seen 8/17 (as above); he's had prev MI & mult interventions, 2DEcho 10/11 showed EF=35-40% & mild HK;  Repeat 2DEcho 7/16 showed decr LVF w/ EF=40-45%, AK of apex & septum, Gr1DD, paradox septal motion, AoV sclerosis &mild AI, mild MV leaflet thickening; Stable- no change in meds.    Hx Syncope, AV block, Implanted Defib> followed by DrTaylor (seen 2/17- note reviewed) w/ his StJude biventricICD working normally) w/ pacer checks every 2-30mo he says; generator changed 6/11; he denies CP/angina, dizzy/ syncope, etc...    Chol> on Simva40, Lopid600, FishOil; FLP 2/17 shows TChol 148, TG 55, HDL 53, LDL 85... No changes made, we stressed diet...    GI- GERD, constip> he had LapChole 2006; hx chr constip & anal stenosis- had eval by DrPatterson; Colonoscopy 2005 by DrPatterson was neg/ wnl; Rectal exam showed anal stenosis- dilated via anoscopy; he was treated w/ Linzess & Chronulac & AnusolHC and improved (now just using  Miralax)...     GU- elev PSA> He sees DrHerrick for elev PSA- they are approaching this conservatively w/ PSA Q3-108mo & consider Bx & hormone therapy if it continues to rise; PSA 09/2015 was 12.8 & pt declined Bx (didn't want to consider XRT), they discussed options & are continuing to monitor (see note of 10/21/15- reviewed)... PSA 8/17= 12.25    DJD> he just uses Tylenol as needed...    VitB12 defic> on B12 shots monthly... EXAM shows Afeb, VSS, O2sat=98% on RA;  HEENT- neg, mallampati1;  Chest- clear w/o w/r/r;  Heart- RR w/o gr1/6SEM w/o r/g;  Abd- soft, neg;  Ext- +arthritic changes, w/o c/c/e;  Neuro- intact...  LABS 03/2016>  Chems- wnl;  CBC- new mild anemia w/ Hg=10.4, mcv=93;  Repeat CBC showed Hg=12.3,  Fe=116 (29%sat),  B12=329,  SPE/IEP- no monoclonal prot;  stool card was pos for hidden blood... IMP/PLAN>>  Rawad has mulitsys disease but is still going strong- CAD, cardiomyop, ICD etc; plus prostate cancer on watchful waiting from [place]; new prob is mild anemia, Fe/ B12/ SPE are ok but stool pos for occult blood; he has hx chr constip & anal stenosis, on Miralax, prev followed by DrPatterson- needs GI eval for +stool & we will refer...    ~  September 13, 2016:  1mo ROV & Norah notes that DrPerry started him on Omeprazole but he is scared to take it because he is on Plavix as well- we discussed the potential interaction & the controversy regarding lack of any clinically observed problems, the current recommendations however favor the use of Pantoprazole40 in pt's on Plavix... We reviewed the following medical problems during today's office visit >>     HBP> on MetopER100AM, Altace10PM;  BP= 140/80 & he denies CP, palpit, dizzy, SOB, edema; states feeling well & energy is good...    CAD w/ MI & interventions, Ischemic cardiomyop decr EF, chr sys heart failure, AS & MR> on ASA81/ Plavix75; followed by DrMcAlhany/Taylor & seen 8/17 (as above); he's had prev MI & mult interventions, 2DEcho  10/11 showed EF=35-40% & mild HK;  Repeat 2DEcho  7/16 showed decr LVF w/ EF=40-45%, AK of apex & septum, Gr1DD, paradox septal motion, AoV sclerosis &mild AI, mild MV leaflet thickening; Stable- no change in meds.    Hx Syncope, AV block, Implanted Defib> followed by DrTaylor (seen 2/17- note reviewed) w/ his StJude biventricICD working normally w/ pacer checks every 2-4mo he says; generator changed 6/11; he denies CP/angina, dizzy/ syncope, etc...    Chol> on Simva40, Lopid600, FishOil; FLP 2/18 shows TChol 147, TG 173, HDL 43, LDL 69... No changes made, we stressed diet...    GI- GERD, constip> he had LapChole 2006; hx chr constip & anal stenosis- had eval by DrPatterson; Colonoscopy 2005 was neg/ wnl; Rectal exam showed anal stenosis- dilated via anoscopy; he was treated w/ Linzess & Chronulac & AnusolHC=> improved (now just using Miralax);  He saw DrPerry in 2015 & again 05/16/16- anemia, heme-pos stools, on ASA/Plavix, chr constip, anal stenosis, chr abd discomfort; he was felt to be too high risk for endoscopic procedures?  Placed on [place], but Protonix40 would be a better option for pt on Plavix rx...    GU- elev PSA> He sees DrHerrick for elev PSA- they are approaching this conservatively w/ PSA Q3-60mo & consider Bx & hormone therapy if it continues to rise; PSA 09/2015 was 12.8 & pt declined Bx (didn't want to consider XRT), they discussed options & are continuing to monitor (see note of 10/21/15- reviewed)... PSA 8/17= 12.25 & they continue watchful waiting protocol...    DJD> he just uses Tylenol as needed...    VitB12 defic> on B12 shots monthly & Vit B12 level 2/18 = 240, continue 1077mcg IM Qmonth... EXAM shows Afeb, VSS, O2sat=98% on RA;  HEENT- neg, mallampati1;  Chest- clear w/o w/r/r;  Heart- RR w/o gr1/6SEM w/o r/g;  Abd- soft, neg;  Ext- +arthritic changes, w/o c/c/e;  Neuro- intact...  CXR 09/13/16 (independently reviewed by me in the PACS system) shows norm heart size & atherosclerosis of  Ao, clear lungs w/ ICD on left, NAD...  LABS 09/17/16>  FLP- Chol ok but TG=173 on diet/ Simva40/ Lopid600/ Fish Oil;  Chems- wnl;  CBC- ok x Hg=12.0, eos=5%;  TSH=4.93;  B12=240... IMP/PLAN>>  Tyjae has finally trired!  Refills provided for 90d supplies per his request- change to Protonix40...           Problem List:  HYPERTENSION (ICD-401.9) - controlled on TOPROL XL 100mg /d & ALTACE 10mg /d... takes meds regularly and tolerates well...  ~  7/12: BP= 120/68 and OK at home per pt; he gets rare HA which he believes is related to his BP; denies fatigue, visual changes, CP, palipit, dizziness, syncope, dyspnea, edema, etc... ~  CXR 9/12 showed dual lead pacer/ AICD, norm heart size, clear lungs, NAD... ~  2/13:  BP= 124/68 & he remains asymptomatic... ~  8/13:  BP= 122/58 & he denies CP, palpit, SOB, edema, etc... ~  2/14: on MetopER100, Altace10;  BP= 148/60 & he denies CP, palpit, dizzy, SOB, edema. ~  8/14: on MetopER100 in AM, Altace10 in PM;  BP= 120/70 & he denies CP, palpit, dizzy, SOB, edema. ~  2/15: on MetopER100 & Altace10; BP= 136/64 & he remains asymptomatic... ~  8/15: on MetopER100AM, Altace10PM;  BP= 100/60 & he denies CP, palpit, dizzy, SOB, edema; states feeling well & energy is good ~  CXR 8/15 showed norm heart size, Pacer/ICD stable, stent noted, lungs clear, NAD. ~  2/16:  He remains on MetopER100 & Altace10; BP= 138/80 & he denies  any current CP, palpit, SOB, edema.  CAD (ICD-414.00) - on ASA 81mg /d & PLAVIX 75mg /d> followed by Benay Spice... ISCHEMIC CARDIOMYOPATHY (ICD-414.8) - he's had prev MI w/ mult caths/ interventions. CHRONIC SYSTOLIC HEART FAILURE (NFA-213.08) AORTIC STENOSIS (ICD-424.1) > Mild by 2DEcho 10/11 MITRAL REGURGITATION (ICD-396.3) ~  last cath 5/08 showed norm Lmain, patent stent in prox LAD, 80-90% lesion in mid-LAD w/ PCI & stent placed, & 50% ostial stenosis of 1st diagonal branch of LAD; 40%mid & 70%distal CIRC; 50%prox 40%mid 40%distal RCA; Global HK  w/EF=30%. ~  NuclearStressTest 4/09 showed infer scar, no ischemia, EF=40%. ~  2DEcho 10/11 showed mild LVH, decr LVF w/ EF= 35-40% & mild HK, gr1DD, mild AS & MR, mild LAdil. ~  EKG 10/12 showed pacer rhythm... 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  Hx of SYNCOPE (ICD-780.2) AV BLOCK, COMPLETE (ICD-426.0) AUTOMATIC IMPLANTABLE CARDIAC DEFIBRILLATOR SITU (ICD-V45.02) ~  Pacer/ ICD per DrTaylor w/ several adjustments in 2008... ~  5/11:  f/u DrTaylor w/ decision for elective generator change> done 6/11 & tol well. ~  He continues to f/u w/ DrTaylor every 6 months... ~  2/13: f/u DrTaylor doing satis, device working normally, no changes made & f/u 47yr... ~  2/14: Hx Syncope, AV block, Implanted Defib> followed by DrTaylor w/ pacer checks every 2-12mo he says; generator changed 6/11; he denies CP/angina, dizzy/ syncope, etc. ~  2/15: he had f/u DrTaylor> stable & doing satis...  HYPERCHOLESTEROLEMIA (ICD-272.0) - takes SIMVASTATIN 40mg /d, GEMFIBRIZOL 600mg /d, & FISH OIL Bid... ~  [place]aylor 08/14/07 on Simva40 showed TChol 163, TG 94, HDL 34, LDL 111... rec incr Simvastatin to 80mg /d. ~  FLP 1/10 showed TChol 125, TG 67, HDL 32, LDL 80... rec- continue same. ~  FLP 1/11 on Simva80+Gemfib600 showed TChol 137, TG 99, HDL 39, LDL 78 ~  FLP 1/12 on Simva40+Gemfib600 showed TChol 157, TG 139, HDL 38, LDL 92 ~  FLP 1/13 on Simva40+Gemfib600 showed TChol 149, TG 50, HDL 39, LDL 100 ~  FLP 1/14 on Simva40+Gemfib600 showed TChol 152, TG 74, HDL 38, LDL 99  ~  FLP 1/15 on  Simva40+Gemfib600 showed TChol 159, TG 64, HDL 43, LDL 103 ~  FLP 6/15 on Simva40+Gemfib600 showed TChol 168, TG 117, HDL 41, LDL 104  ~  FLP 1/16 on Simva40+Gemfib600 showed TChol 148, TG 111, HDL 45, LDL 81  BORDERLINE TSH >> ~  Labs 1/15 showed TSH= 3.78 ~  Labs 1/16 showed an elev TSH for the 1st time w/ TSH= 5.40, he is clinically euthyroid.  GERD (ICD-530.81) - he had ERCP in 8/06 w/ norm ducts, +gallstones, s/p lap chole 2006... he uses OTC meds Prn... ~  he notes that he had a colonoscopy in the past from DrPatterson, & he reports that it was neg. ~  7/15> GI eval from DrPerry, he didn't comment on the eosinophilia...  ELEVATED PSA >> ~  Labs 1/16 showed an elev PSA for the 1st time w/ PSA= 5.27  DEGENERATIVE JOINT DISEASE (  ICD-715.90) - he tried Mobic but it bothered his stomach, therefore uses Tylenol Prn.  VITAMIN B12 DEFICIENCY (ICD-266.2) - old chart not available- ? details of this Dx... on B12 shots xyrs. ~  labs 1/09 showed Hg=12.9 ~  labs 1/10 showed Hg= 12.7 ~  labs 1/11 showed Hg= 12.6 ~  labs 1/12 showed Hg= 13.2 ~  Labs 1/13 showed Hg= 12.6 ~  Labs 1/14 showed Hg= 12.7 ~  Labs 1/15 showed Hg= 12.6, WBC= 5.7 w/ 10%eos ~  Labs 7/15 showed Hg= 11.7, WBC= 16.9 w/ 57%eos... ~  Labs 8/15 showed Hg= 11.6, WBC= 6.9, w/ 17%eos... ~  Labs 1/16 showed Hg= 12.7, WBC= 4.4 w/ 9% eos  Health Maintenance: ~  GI:  Followed by DrPatterson ~  GU:  PSA checked yearly & remains wnl... ~  Immunizations:  he gets the yearly Flu vaccine;  [place], he says;  given Pneumovax 1/11 age 31.   Past Surgical History:  Procedure Laterality Date  . CARDIAC CATHETERIZATION  2008  . icd implanted  2005  . LAPAROSCOPIC CHOLECYSTECTOMY  04/2005   Dr. Lucia Gaskins  . ROTATOR CUFF REPAIR      Outpatient Encounter Prescriptions as of 09/13/2016  Medication Sig  . aspirin 81 MG tablet Take 81 mg by mouth daily.    . cyanocobalamin (,VITAMIN B-12,) 1000 MCG/ML  injection INJECT 1 ML INTO THE MUSCLE EVERY 30 DAYS  . fish oil-omega-3 fatty acids 1000 MG capsule Take 2 g by mouth daily.    . Multiple Vitamins-Minerals (MENS MULTIVITAMIN PLUS PO) Take 1 tablet by mouth daily.    . Polyethylene Glycol POWD Take 1 scoop by mouth daily.   . [DISCONTINUED] clopidogrel (PLAVIX) 75 MG tablet Take 1 tablet (75 mg total) by mouth daily.  . [DISCONTINUED] cyanocobalamin (,VITAMIN B-12,) 1000 MCG/ML injection INJECT 1 ML INTO THE MUSCLE EVERY 30 DAYS  . [DISCONTINUED] gemfibrozil (LOPID) 600 MG tablet Take 1 tablet (600 mg total) by mouth daily.  . [DISCONTINUED] gemfibrozil (LOPID) 600 MG tablet Take 1 tablet (600 mg total) by mouth daily.  . [DISCONTINUED] metoprolol succinate (TOPROL-XL) 100 MG 24 hr tablet Take 1 tablet (100 mg total) by mouth daily. Take with or immediately following a meal.  . [DISCONTINUED] metoprolol succinate (TOPROL-XL) 100 MG 24 hr tablet Take 1 tablet (100 mg total) by mouth daily. Take with or immediately following a meal.  . [DISCONTINUED] ramipril (ALTACE) 10 MG capsule Take 1 capsule (10 mg total) by mouth daily.  . [DISCONTINUED] ramipril (ALTACE) 10 MG capsule Take 1 capsule (10 mg total) by mouth daily.  . [DISCONTINUED] simvastatin (ZOCOR) 40 MG tablet Take 1 tablet (40 mg total) by mouth every evening.  . [DISCONTINUED] simvastatin (ZOCOR) 40 MG tablet Take 1 tablet (40 mg total) by mouth every evening.  . [DISCONTINUED] omeprazole (PRILOSEC) 20 MG capsule Take 1 capsule (20 mg total) by mouth daily. (Patient not taking: Reported on 09/13/2016)  . [DISCONTINUED] omeprazole (PRILOSEC) 20 MG capsule Take 1 capsule (20 mg total) by mouth daily. Take 30 minutes before a meal   No facility-administered encounter medications on file as of 09/13/2016.     No Known Allergies    Immunization History  Administered Date(s) Administered  . H1N1 08/04/2008  . Influenza Split 05/10/2012, 05/11/2013  . Influenza Whole 05/04/2008, 06/21/2009,  05/08/2010  . Influenza,inj,Quad PF,36+ Mos 04/09/2014, 05/07/2016  . Influenza-Unspecified 05/07/2015  . Pneumococcal Conjugate-13 09/09/2014  . Pneumococcal Polysaccharide-23 05/17/2000, 08/30/2009  Current Medications, Allergies, Past Medical History, Past Surgical History, Family History, and Social History were reviewed in [employer] record.    Review of Systems        See HPI - all other systems neg except as noted...  The patient complains of dyspnea on exertion.  The patient denies anorexia, fever, weight loss, weight gain, vision loss, decreased hearing, hoarseness, chest pain, syncope, peripheral edema, prolonged cough, headaches, hemoptysis, abdominal pain, melena, hematochezia, severe indigestion/heartburn, hematuria, incontinence, muscle weakness, suspicious skin lesions, transient blindness, difficulty walking, depression, unusual weight change, abnormal bleeding, enlarged lymph nodes, and angioedema.     Objective:   Physical Exam     WD, WN, 83 y/o WM in NAD... wt=163# ht=6'1" BMI=21... GENERAL:  Alert & oriented; pleasant & cooperative... HEENT:  [place]/AT, EOM-wnl, PERRLA, EACs-clear, TMs-wnl, NOSE-clear, THROAT-clear & wnl. NECK:  Supple w/ fairROM; no JVD; normal carotid impulses w/o bruits; no thyromegaly or nodules palpated; no lymphadenopathy. CHEST:  Clear to P & A; without wheezes/ rales/ or rhonchi heard... HEART:  Pacer in L upper chest area~ nontender, RR, gr 1/6 SEM without rubs or gallops detected... ABDOMEN:  Soft & nontender; normal bowel sounds; no organomegaly or masses palpated... EXT: without deformities, mild arthritic changes; no varicose veins/ venous insuffic/ or edema. NEURO:  CN's intact; no focal neuro deficits apprec... DERM:  No lesions noted; no rash etc...  RADIOLOGY DATA:  Reviewed in the EPIC EMR & discussed w/ the patient...   LABORATORY DATA:  Reviewed in the EPIC EMR & discussed w/ the patient...      Assessment & Plan:    HBP>  Controlled on Metoprolol & Ramipril, continue same...  CAD, Cardiomyopathy, CHF, ValvHeartDis w/ AS & MR>  Followed by Tenny Craw & stable on ASA/ Plavix, still working 5d per week etc...  Hx syncope, AVBlock, Pacer/ICD>  Followed by DrTaylor & stable, continue same...  LIPIDS>  FLP looks OK on Simva40, Gemfib 600mg /d, & Fish Oil...  GI symptoms 7/15>  Resolved w/ Colace & Miralax... He feels he is back to baseline... 03/2016>  He has mild anemia w/ Hg=10.4 & stool card pos for occult blood => refer to DrPerry for further eval... Not felt to be an endoscopic candidate- rec to take Omep20 by DrPerry... 2/18>  Arin has finally trired!  Refills provided for 90d supplies per his request- change to Protonix40...  GU> Elev PSA/ prostate cancer => followed by [place] for Urology on watchful waiting...  DJD>  He uses Tylenol as needed; still working regularly...  B-12 defic>  He remains on B12 shots monthly...   Patient's Medications  New Prescriptions   No medications on file  Previous Medications   ASPIRIN 81 MG TABLET    Take 81 mg by mouth daily.     FISH OIL-OMEGA-3 FATTY ACIDS 1000 MG CAPSULE    Take 2 g by mouth daily.     MULTIPLE VITAMINS-MINERALS (MENS MULTIVITAMIN PLUS PO)    Take 1 tablet by mouth daily.     POLYETHYLENE GLYCOL POWD    Take 1 scoop by mouth daily.   Modified Medications   Modified Medication Previous Medication   CLOPIDOGREL (PLAVIX) 75 MG TABLET clopidogrel (PLAVIX) 75 MG tablet      Take 1 tablet (75 mg total) by mouth daily.    Take 1 tablet (75 mg total) by mouth daily.   CYANOCOBALAMIN (,VITAMIN B-12,) 1000 MCG/ML INJECTION cyanocobalamin (,VITAMIN B-12,) 1000 MCG/ML injection      INJECT  1 ML INTO THE MUSCLE EVERY 30 DAYS    INJECT 1 ML INTO THE MUSCLE EVERY 30 DAYS   GEMFIBROZIL (LOPID) 600 MG TABLET gemfibrozil (LOPID) 600 MG tablet      Take 1 tablet (600 mg total) by mouth daily.    Take 1 tablet (600 mg  total) by mouth daily.   METOPROLOL SUCCINATE (TOPROL-XL) 100 MG 24 HR TABLET metoprolol succinate (TOPROL-XL) 100 MG 24 hr tablet      Take 1 tablet (100 mg total) by mouth daily. Take with or immediately following a meal.    Take 1 tablet (100 mg total) by mouth daily. Take with or immediately following a meal.   OMEPRAZOLE (PRILOSEC) 20 MG CAPSULE omeprazole (PRILOSEC) 20 MG capsule      Take 1 capsule (20 mg total) by mouth daily. Take 30 minutes before a meal    Take 1 capsule (20 mg total) by mouth daily.   RAMIPRIL (ALTACE) 10 MG CAPSULE ramipril (ALTACE) 10 MG capsule      Take 1 capsule (10 mg total) by mouth daily.    Take 1 capsule (10 mg total) by mouth daily.   SIMVASTATIN (ZOCOR) 40 MG TABLET simvastatin (ZOCOR) 40 MG tablet      Take 1 tablet (40 mg total) by mouth every evening.    Take 1 tablet (40 mg total) by mouth every evening.  Discontinued Medications   CLOPIDOGREL (PLAVIX) 75 MG TABLET    Take 1 tablet (75 mg total) by mouth daily.

## 2017-03-11 ENCOUNTER — Ambulatory Visit
Admission: RE | Admit: 2017-03-11 | Discharge: 2017-03-11 | Disposition: A | Discharge status: Home / Self Care | Payer: Medicare Other | Source: Ambulatory Visit | Attending: Radiation Oncology | Admitting: Radiation Oncology

## 2017-03-11 DIAGNOSIS — C61 Malignant neoplasm of prostate: Secondary | ICD-10-CM | POA: Diagnosis not present

## 2017-03-12 ENCOUNTER — Encounter: Payer: Self-pay | Admitting: Medical Oncology

## 2017-03-12 ENCOUNTER — Ambulatory Visit
Admission: RE | Admit: 2017-03-12 | Discharge: 2017-03-12 | Disposition: A | Discharge status: Home / Self Care | Payer: Medicare Other | Source: Ambulatory Visit | Attending: Radiation Oncology | Admitting: Radiation Oncology

## 2017-03-12 DIAGNOSIS — C61 Malignant neoplasm of prostate: Secondary | ICD-10-CM | POA: Diagnosis not present

## 2017-03-13 ENCOUNTER — Ambulatory Visit
Admission: RE | Admit: 2017-03-13 | Discharge: 2017-03-13 | Disposition: A | Discharge status: Home / Self Care | Payer: Medicare Other | Source: Ambulatory Visit | Attending: Radiation Oncology | Admitting: Radiation Oncology

## 2017-03-13 ENCOUNTER — Ambulatory Visit (INDEPENDENT_AMBULATORY_CARE_PROVIDER_SITE_OTHER): Payer: Medicare Other | Admitting: Pulmonary Disease

## 2017-03-13 VITALS — BP 126/60 | HR 52 | Temp 97.6°F | Ht 71.0 in | Wt 167.2 lb

## 2017-03-13 DIAGNOSIS — I447 Left bundle-branch block, unspecified: Secondary | ICD-10-CM

## 2017-03-13 DIAGNOSIS — E538 Deficiency of other specified B group vitamins: Secondary | ICD-10-CM

## 2017-03-13 DIAGNOSIS — I2510 Atherosclerotic heart disease of native coronary artery without angina pectoris: Secondary | ICD-10-CM

## 2017-03-13 DIAGNOSIS — I10 Essential (primary) hypertension: Secondary | ICD-10-CM | POA: Diagnosis not present

## 2017-03-13 DIAGNOSIS — C61 Malignant neoplasm of prostate: Secondary | ICD-10-CM

## 2017-03-13 DIAGNOSIS — I255 Ischemic cardiomyopathy: Secondary | ICD-10-CM

## 2017-03-13 DIAGNOSIS — Z95810 Presence of automatic (implantable) cardiac defibrillator: Secondary | ICD-10-CM

## 2017-03-13 DIAGNOSIS — I350 Nonrheumatic aortic (valve) stenosis: Secondary | ICD-10-CM

## 2017-03-13 DIAGNOSIS — I5022 Chronic systolic (congestive) heart failure: Secondary | ICD-10-CM

## 2017-03-13 DIAGNOSIS — M159 Polyosteoarthritis, unspecified: Secondary | ICD-10-CM

## 2017-03-13 DIAGNOSIS — M150 Primary generalized (osteo)arthritis: Secondary | ICD-10-CM

## 2017-03-13 DIAGNOSIS — E785 Hyperlipidemia, unspecified: Secondary | ICD-10-CM

## 2017-03-13 MED ORDER — LEVOTHYROXINE SODIUM 50 MCG PO TABS: 50.0000 ug | ORAL_TABLET | Freq: Every day | ORAL | 3 refills | Status: DC

## 2017-03-13 NOTE — Patient Instructions (Signed)
Today we updated your med list in our EPIC system...    Continue your current medications the same...  We revewed your follow up blood work & your THYROID is sluggish... \   I recommend starting a thyroid pill> LEVOTHYROID 27mcg tabs- take one tab 1st thing in the AM & don't eat for 30-3min after taking it to insure good absorption...  Continue your Multivit w/ IRON daily...  Call for any questions...  Let's plan a follow up visit in 26mo, sooner if needed for problems.Marland KitchenMarland Kitchen

## 2017-03-14 ENCOUNTER — Encounter: Payer: Self-pay | Admitting: Pulmonary Disease

## 2017-03-14 ENCOUNTER — Ambulatory Visit
Admission: RE | Admit: 2017-03-14 | Discharge: 2017-03-14 | Disposition: A | Discharge status: Home / Self Care | Payer: Medicare Other | Source: Ambulatory Visit | Attending: Radiation Oncology | Admitting: Radiation Oncology

## 2017-03-14 DIAGNOSIS — C61 Malignant neoplasm of prostate: Secondary | ICD-10-CM | POA: Diagnosis not present

## 2017-03-14 NOTE — Progress Notes (Signed)
Subjective:    Patient ID: [name], male    DOB: 1931-09-09, 83 y.o.   MRN: [id number]  HPI 83 y/o WM here for a follow up visit & review of mult medical problems... 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 ~  SEE PREV EPIC NOTES FOR OLDER DATA >>     2DEcho 10/11 showed EF=35-40% & mild HK  LABS 1/15:  FLP- ok on Simva40+Lopid600;  Chems- wnl;  CBC- ok w/ Hg=12.6;  TSH=3.78;  PSA=3.47...  LABS 6/15 & 7/15 showed> FLP- fair w/ LDL=104;  Chems- wnl;  CBC- ok x WBC=16.9 w/ 57%eos; f/u CBC 8/15 showed Hg=11.6, WBC=6.9 w/ 17%eos...  CXR 8/15 showed norm heart size, Pacer/ICD stable, stent noted, lungs clear, NAD...   ~   [id number]:  Elby was injured at work (fell pulling yarn out of a machine) & sustained fx rib, went to ER 08/14/14, note reviewed, XRay w/ left anterolat 6th rib fx, treated w/ rest/ heat/ Tramadol/ Tylenol/ etc...     CXR 08/14/14 showed norm heart size, Pacer/AICD on left, clear lungs w/ mild peribronch thickening, mult old rib fxs on left, NAD...   LABS 1/16:  FLP looks good on Simva40/Lopid600; Chems- wnl;  CBC- wnl;  TSH=5.40;  PSA=5.27...    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 We reviewed the following medical problems during today's office visit >>     HBP> on MetopER100AM, Altace10PM;  BP= 130/78 & he denies CP, palpit, dizzy, SOB, edema; states feeling well & energy is good...    CAD, Ischemic cardiomyop EF=35-40%, chr sys heart failure, AS & MR> on ASA/ Plavix; followed by Benay Spice & seen 7/16 (note reviewed); he's had prev MI & mult interventions, last 2DEcho 10/11 showed EF=35-40% & mild HK;  Repeat 2DEcho 7/16 showed decr LVF w/ EF=40-45%, AK of apex & septum, Gr1DD, paradox septal  motion, AoV sclerosis &mild AI, mild MV leaflet thickening (no change in meds).    Hx Syncope, AV block, Implanted Defib> followed by DrTaylor (seen 2/16- note reviewed) w/ his StJude biventricICD working normally) w/ pacer checks every 2-49mo he says; generator changed 6/11; he denies CP/angina, dizzy/ syncope.     Chol> on Simva40, Lopid600, FishOil; FLP 1/16 shows TChol 148, TG 111, HDL 45, LDL 81... No changes made, we stressed diet...    GI- GERD, constip> he had LapChole 2006; hx chr constip & anal stenosis- had eval by DrPatterson; cannot find reference to prev colonoscopy in Epic or [facility] but he says neg colon 2005 by DrPatterson; Rectal exam showed anal stenosis- dilated via anoscopy; he was treated w/ Linzess & Chronulac & AnusolHC and improved (now just using Miralax)...     GU- elev PSA> He saw DrHerrick & now follows w/ him for elev PSA- they are approaching the prob conservatively w/ PSA Q7mo & consider hormone therapy if it continues to rise...    DJD> he just uses Tylenol as needed...    VitB12 defic> on B12 shots monthly... We reviewed prob list, meds, xrays and labs> see below for updates >>   EKG 02/11/15 showed SBrady, rate50, AV sequential pacing...   2DEcho 7/16 showed decr LVF w/  EF=40-45%, AK of apex & septum, Gr1DD, paradox septal motion, AoV sclerosis &mild AI, mild MV leaflet thickening (no change in meds).  LABS 8/16:  Chems- wnl;  CBC- wnl;  TSH=4.07   ADDENDUM>  He saw KW 08/24/15 w/ sore throat & exam c/w candida/thrush- strep swab neg, treated w/ MMW & Diflucan, resolved...   ~  September 14, 2015:  47mo [name] reports doing well- still working 5d/wk for [employer];  We reviewed the following medical problems during today's office visit >>     HBP> on MetopER100AM, Altace10PM;  BP= 140/68 & he denies CP, palpit, dizzy, SOB, edema; states feeling well & energy is good...    CAD w/ MI & interventions, Ischemic cardiomyop EF=35-40%, chr sys heart  failure, AS & MR> on ASA/ Plavix; followed by DrKatz/Taylor & seen 7/16 (note reviewed); he's had prev MI & mult interventions, 2DEcho 10/11 showed EF=35-40% & mild HK;  Repeat 2DEcho 7/16 showed decr LVF w/ EF=40-45%, AK of apex & septum, Gr1DD, paradox septal motion, AoV sclerosis &mild AI, mild MV leaflet thickening (no change in meds).    Hx Syncope, AV block, Implanted Defib> followed by DrTaylor (seen 2/16- note reviewed) w/ his StJude biventricICD working normally) w/ pacer checks every 2-69mo he says; generator changed 6/11; he denies CP/angina, dizzy/ syncope.     Chol> on Simva40, Lopid600, FishOil; FLP 2/17 shows TChol 148, TG 55, HDL 53, LDL 85... No changes made, we stressed diet...    GI- GERD, constip> he had LapChole 2006; hx chr constip & anal stenosis- had eval by DrPatterson; Colonoscopy 2005 by DrPatterson was neg/ wnl; Rectal exam showed anal stenosis- dilated via anoscopy; he was treated w/ Linzess & Chronulac & AnusolHC and improved (now just using Miralax)...     GU- elev PSA> He saw DrHerrick & now follows w/ him for elev PSA- they are approaching the prob conservatively w/ PSA Q3-64mo & consider Bx & hormone therapy if it continues to rise; last seen 12/16- PSA =9.45, note reviewed...    DJD> he just uses Tylenol as needed...    VitB12 defic> on B12 shots monthly... EXAM shows Afeb, VSS, O2sat=98% on RA;  HEENT- neg, mallampati1;  Chest- clear w/o w/r/r;  Heart- RR w/o gr1/6SEM w/o r/g;  Abd- soft, neg;  Ext- +arthritic changes, w/o c/c/e;  Neuro- intact...  LABS 09/09/15> FLP- all parameters at goals;  Chem- wnl;  CBC- ok w/ Hg=12.0, MCV=94;  TSH-3.64;  PSA=12.82 & pt is encouraged to f/u w/ Urology- [place] ASAP... IMP/PLAN>>  [name] is clinically stable, still working 5d/wk, no new complaints or concerns;  He has HBP, CAD, cardiomyop w/ EF 40-45% & ICD in place- followed by DrTaylor;  He has a rising PSA followed by DrHerrick & given a copy of labs to take to Urology... Same  meds/ ROV 31mo...  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  We reviewed the following medical problems during today's office visit >>     HBP> on MetopER100AM, Altace10PM;  BP= 114/68 & he denies CP, palpit, dizzy, SOB, edema; states feeling well & energy is good...    CAD w/ MI & interventions, Ischemic cardiomyop decr EF, chr sys heart failure, AS & MR> on ASA/ Plavix; followed by DrMcAlhany/Taylor & seen 8/17 (as above); he's had prev MI & mult interventions, 2DEcho 10/11 showed EF=35-40% & mild HK;  Repeat 2DEcho 7/16 showed decr LVF w/ EF=40-45%, AK of apex & septum, Gr1DD, paradox septal motion, AoV sclerosis &mild AI, mild MV leaflet thickening; Stable- no change in meds.    Hx Syncope, AV block, Implanted Defib> followed by DrTaylor (seen 2/17- note reviewed) w/ his StJude biventricICD working normally) w/ pacer checks every 2-68mo he says; generator changed 6/11; he denies CP/angina, dizzy/ syncope, etc...    Chol> on Simva40, Lopid600, FishOil; FLP 2/17 shows TChol 148, TG 55, HDL 53, LDL 85... No changes made, we stressed diet...    GI- GERD, constip> he had LapChole 2006; hx chr constip & anal stenosis- had eval by DrPatterson; Colonoscopy 2005 by DrPatterson was neg/ wnl; Rectal exam showed anal stenosis- dilated via anoscopy; he was treated w/ Linzess & Chronulac & AnusolHC and improved (now just using  Miralax)...     GU- elev PSA> He sees DrHerrick for elev PSA- they are approaching this conservatively w/ PSA Q3-62mo & consider Bx & hormone therapy if it continues to rise; PSA 09/2015 was 12.8 & pt declined Bx (didn't want to consider XRT), they discussed options & are continuing to monitor (see note of 10/21/15- reviewed)... PSA 8/17= 12.25    DJD> he just uses Tylenol as needed...    VitB12 defic> on B12 shots monthly... EXAM shows Afeb, VSS, O2sat=98% on RA;  HEENT- neg, mallampati1;  Chest- clear w/o w/r/r;  Heart- RR w/o gr1/6SEM w/o r/g;  Abd- soft, neg;  Ext- +arthritic changes, w/o c/c/e;  Neuro- intact...  LABS 03/2016>  Chems- wnl;  CBC- new mild anemia w/ Hg=10.4, mcv=93;  Repeat CBC showed Hg=12.3,  Fe=116 (29%sat),  B12=329,  SPE/IEP- no monoclonal prot;  stool card was pos for hidden blood... IMP/PLAN>>  Drue has mulitsys disease but is still going strong- CAD, cardiomyop, ICD etc; plus prostate cancer on watchful waiting from [place]; new prob is mild anemia, Fe/ B12/ SPE are ok but stool pos for occult blood; he has hx chr constip & anal stenosis, on Miralax, prev followed by DrPatterson- needs GI eval for +stool & we will refer...   ~  September 13, 2016:  57mo ROV & Yobani notes that DrPerry started him on Omeprazole but he is scared to take it because he is on Plavix as well- we discussed the potential interaction & the controversy regarding lack of any clinically observed problems, the current recommendations however favor the use of Pantoprazole40 in pt's on Plavix... We reviewed the following medical problems during today's office visit >>     HBP> on MetopER100AM, Altace10PM;  BP= 140/80 & he denies CP, palpit, dizzy, SOB, edema; states feeling well & energy is good...    CAD w/ MI & interventions, Ischemic cardiomyop decr EF, chr sys heart failure, AS & MR> on ASA81/ Plavix75; followed by DrMcAlhany/Taylor & seen 8/17 (as above); he's had prev MI & mult interventions, 2DEcho 10/11  showed EF=35-40% & mild HK;  Repeat 2DEcho 7/16  showed decr LVF w/ EF=40-45%, AK of apex & septum, Gr1DD, paradox septal motion, AoV sclerosis &mild AI, mild MV leaflet thickening; Stable- no change in meds.    Hx Syncope, AV block, Implanted Defib> followed by DrTaylor (seen 2/17- note reviewed) w/ his StJude biventricICD working normally w/ pacer checks every 2-67mo he says; generator changed 6/11; he denies CP/angina, dizzy/ syncope, etc...    Chol> on Simva40, Lopid600, FishOil; FLP 2/18 shows TChol 147, TG 173, HDL 43, LDL 69... No changes made, we stressed diet...    GI- GERD, constip> he had LapChole 2006; hx chr constip & anal stenosis- had eval by DrPatterson; Colonoscopy 2005 was neg/ wnl; Rectal exam showed anal stenosis- dilated via anoscopy; he was treated w/ Linzess & Chronulac & AnusolHC=> improved (now just using Miralax);  He saw DrPerry in 2015 & again 05/16/16- anemia, heme-pos stools, on ASA/Plavix, chr constip, anal stenosis, chr abd discomfort; he was felt to be too high risk for endoscopic procedures?  Placed on [place], but Protonix40 would be a better option for pt on Plavix rx...    GU- elev PSA> He sees DrHerrick for elev PSA- they are approaching this conservatively w/ PSA Q3-40mo & consider Bx & hormone therapy if it continues to rise; PSA 09/2015 was 12.8 & pt declined Bx (didn't want to consider XRT), they discussed options & are continuing to monitor (see note of 10/21/15- reviewed)... PSA 8/17= 12.25 & they continue watchful waiting protocol...    DJD> he just uses Tylenol as needed...    VitB12 defic> on B12 shots monthly & Vit B12 level 2/18 = 240, continue 1087mcg IM Qmonth... EXAM shows Afeb, VSS, O2sat=98% on RA;  HEENT- neg, mallampati1;  Chest- clear w/o w/r/r;  Heart- RR w/o gr1/6SEM w/o r/g;  Abd- soft, neg;  Ext- +arthritic changes, w/o c/c/e;  Neuro- intact...  CXR 09/13/16 (independently reviewed by me in the PACS system) shows norm heart size & atherosclerosis of Ao,  clear lungs w/ ICD on left, NAD...  LABS 09/17/16>  FLP- Chol ok but TG=173 on diet/ Simva40/ Lopid600/ Fish Oil;  Chems- wnl;  CBC- ok x Hg=12.0, eos=5%;  TSH=4.93;  B12=240... IMP/PLAN>>  Jaquann has finally retired!  Refills provided for 90d supplies per his request- change to Protonix40...   ~  March 13, 2017:  31mo ROV & Kean has started on IMRT for his prostate cancer per DrHerrick & DrManning; he notes that it's "getting rough w/ some GI side effects; he's feeling weaker but denies CP, palpit, change in SOB, or edema; he has not been very active & can't do much exercise at this point;  We discussed checking CBC & thyroid w/ his complaints of weakness... We reviewed the following medical problems during today's office visit >>     HBP> on MetopER100AM, Altace10PM;  BP= 126/60 & he denies CP, palpit, dizzy, SOB, edema...    CAD w/ MI & interventions, Ischemic cardiomyop decr EF, chr sys heart failure, AS & MR> on ASA81/ Plavix75 + above; followed by DrMcAlhany/Taylor & last seen 3/18; he's had prev MI & mult interventions, 2DEcho 10/11 showed EF=35-40% & mild HK;  Repeat 2DEcho 7/16 showed decr LVF w/ EF=40-45%, AK of apex & septum, Gr1DD, paradox septal motion, AoV sclerosis &mild AI, mild MV leaflet thickening; Pt stable- no change in meds.    Hx Syncope, complete AV block, Implanted Defib> followed by DrTaylor (seen 2/17 & 3/18- notes reviewed) w/ his StJude biventricICD working normally w/ pacer checks every 2-23mo  he says; generator changed 6/11; he denies CP/angina, dizzy/ syncope, etc...    Chol> on Simva40, Lopid600, FishOil; FLP 2/18 shows TChol 147, TG 173, HDL 43, LDL 69... No changes made, we stressed diet...    Hypothyroid>  Labs 03/2017 showed TSH= 7.10 & we started Levothyroid50...    GI- GERD, constip> he had LapChole 2006; hx chr constip & anal stenosis- had eval by DrPatterson; Colonoscopy 2005 was neg/ wnl; Rectal exam showed anal stenosis- dilated via anoscopy; he was treated w/  Linzess & Chronulac & AnusolHC=> improved (now just using Miralax);  He saw DrPerry in 2015 & again 05/16/16- anemia, heme-pos stools, on ASA/Plavix, chr constip, anal stenosis, chr abd discomfort; he was felt to be too high risk for endoscopic procedures?  Placed on [place], but Protonix40 would be a better option for pt on Plavix rx...    GU- elev PSA> He sees DrHerrick for elev PSA- they are approaching this conservatively w/ PSA Q3-71mo & consider Bx & hormone therapy if it continues to rise; PSA 03/2016 was ~12 & pt declined Bx (didn't want to consider XRT), they discussed options & are continued to monitor=> his PSA 3/18 up to 27 & Bx 4/18 showed 4+4=8 and they decided on IMRT from [facility] (also plan 2 yrs of androgen deprivation therapy)- started 02/2017;  Bone scan neg- no mets seen.    DJD> he just uses Tylenol as needed...    Anemia & VitB12 defic> on B12 shots monthly & Vit B12 level 2/18 = 240, continue 1071mcg IM Qmonth;  Labs 03/2017 showed Hg=10.0, mcv=92 & rec to take MVI w/ Fe... EXAM shows Afeb, VSS, O2sat=98% on RA;  HEENT- neg, mallampati1;  Chest- clear w/o w/r/r;  Heart- RR w/o gr1/6SEM w/o r/g;  Abd- soft, neg;  Ext- +arthritic changes, w/o c/c/e;  Neuro- intact...  LABS 03/07/17>  Chems- wnl;  CBC- Hg=10.0, mcv=92;  TSH=7.10... IMP/PLAN>>  Ngoc is well into his IMRT for the prostate cancer, starting to experience some side effects, feeling weak- labs show TSH=7.10 & we are starting Levothy13mcg/d, and his Hg=10 rec to start an MVI w/ Fe; he will continue the IMRT & planned androgen depriv therapy per Urology & RadOnc...           Problem List:  HYPERTENSION (ICD-401.9) - controlled on TOPROL XL 100mg /d & ALTACE 10mg /d... takes meds regularly and tolerates well...  ~  7/12: BP= 120/68 and OK at home per pt; he gets rare HA which he believes is related to his BP; denies fatigue, visual changes, CP, palipit, dizziness, syncope, dyspnea, edema, etc... ~  CXR 9/12 showed dual lead  pacer/ AICD, norm heart size, clear lungs, NAD... ~  2/13:  BP= 124/68 & he remains asymptomatic... ~  8/13:  BP= 122/58 & he denies CP, palpit, SOB, edema, etc... ~  2/14: on MetopER100, Altace10;  BP= 148/60 & he denies CP, palpit, dizzy, SOB, edema. ~  8/14: on MetopER100 in AM, Altace10 in PM;  BP= 120/70 & he denies CP, palpit, dizzy, SOB, edema. ~  2/15: on MetopER100 & Altace10; BP= 136/64 & he remains asymptomatic... ~  8/15: on MetopER100AM, Altace10PM;  BP= 100/60 & he denies CP, palpit, dizzy, SOB, edema; states feeling well & energy is good ~  CXR 8/15 showed norm heart size, Pacer/ICD stable, stent noted, lungs clear, NAD. ~  2/16:  He remains on MetopER100 & Altace10; BP= 138/80 & he denies any current CP, palpit, SOB, edema.  CAD (ICD-414.00) - on ASA  81mg /d & PLAVIX 75mg /d> followed by Benay Spice... ISCHEMIC CARDIOMYOPATHY (ICD-414.8) - he's had prev MI w/ mult caths/ interventions. CHRONIC SYSTOLIC HEART FAILURE (ZOX-096.04) AORTIC STENOSIS (ICD-424.1) > Mild by 2DEcho 10/11 MITRAL REGURGITATION (ICD-396.3) ~  last cath 5/08 showed norm L[place], patent stent in prox LAD, 80-90% lesion in mid-LAD w/ PCI & stent placed, & 50% ostial stenosis of 1st diagonal branch of LAD; 40%mid & 70%distal CIRC; 50%prox 40%mid 40%distal RCA; Global HK w/EF=30%. ~  NuclearStressTest 4/09 showed infer scar, no ischemia, EF=40%. ~  2DEcho 10/11 showed mild LVH, decr LVF w/ EF= 35-40% & mild HK, gr1DD, mild AS & MR, mild LAdil. ~  EKG 10/12 showed pacer rhythm... 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  Hx of SYNCOPE (ICD-780.2) AV BLOCK, COMPLETE (ICD-426.0) AUTOMATIC IMPLANTABLE CARDIAC DEFIBRILLATOR SITU (ICD-V45.02) ~  Pacer/ ICD per DrTaylor w/ several adjustments in 2008... ~  5/11:  f/u DrTaylor w/ decision for elective generator change> done 6/11 & tol well. ~  He continues to f/u w/ DrTaylor every 6 months... ~  2/13: f/u DrTaylor doing satis, device working normally, no changes made & f/u 41yr... ~  2/14: Hx Syncope, AV block, Implanted Defib> followed by DrTaylor w/ pacer checks every 2-70mo he says; generator changed 6/11; he denies CP/angina, dizzy/ syncope, etc. ~  2/15: he had f/u DrTaylor> stable & doing satis...  HYPERCHOLESTEROLEMIA (ICD-272.0) - takes SIMVASTATIN 40mg /d, GEMFIBRIZOL 600mg /d, & FISH OIL Bid... ~  [place] 08/14/07 on Simva40 showed TChol 163, TG 94, HDL 34, LDL 111... rec incr Simvastatin to 80mg /d. ~  FLP 1/10 showed TChol 125, TG 67, HDL 32, LDL 80... rec- continue same. ~  FLP 1/11 on Simva80+Gemfib600 showed TChol 137, TG 99, HDL 39, LDL 78 ~  FLP 1/12 on Simva40+Gemfib600 showed TChol 157, TG 139, HDL 38, LDL 92 ~  FLP 1/13 on Simva40+Gemfib600 showed TChol 149, TG 50, HDL 39, LDL 100 ~  FLP 1/14 on Simva40+Gemfib600 showed TChol 152, TG 74, HDL 38, LDL 99  ~  FLP 1/15 on Simva40+Gemfib600 showed TChol 159, TG 64, HDL 43, LDL 103 ~  FLP 6/15 on Simva40+Gemfib600 showed TChol 168, TG 117, HDL 41, LDL 104  ~  FLP 1/16 on Simva40+Gemfib600 showed TChol 148, TG 111, HDL 45, LDL 81  BORDERLINE TSH >> ~  Labs 1/15 showed TSH= 3.78 ~  Labs 1/16 showed an elev TSH for the 1st time w/ TSH= 5.40, he is clinically euthyroid.  GERD (ICD-530.81) - he had ERCP in 8/06 w/ norm ducts, +gallstones, s/p lap chole 2006... he uses OTC meds Prn... ~  he notes that he had a colonoscopy in the past from DrPatterson, & he reports that it was neg. ~  7/15> GI eval from DrPerry, he didn't comment on the eosinophilia...  ELEVATED PSA >> ~   Labs 1/16 showed an elev PSA for the 1st time w/ PSA= 5.27  DEGENERATIVE JOINT DISEASE (ICD-715.90) - he tried Mobic but it bothered his stomach, therefore uses  Tylenol Prn.  VITAMIN B12 DEFICIENCY (ICD-266.2) - old chart not available- ? details of this Dx... on B12 shots xyrs. ~  labs 1/09 showed Hg=12.9 ~  labs 1/10 showed Hg= 12.7 ~  labs 1/11 showed Hg= 12.6 ~  labs 1/12 showed Hg= 13.2 ~  Labs 1/13 showed Hg= 12.6 ~  Labs 1/14 showed Hg= 12.7 ~  Labs 1/15 showed Hg= 12.6, WBC= 5.7 w/ 10%eos ~  Labs 7/15 showed Hg= 11.7, WBC= 16.9 w/ 57%eos... ~  Labs 8/15 showed Hg= 11.6, WBC= 6.9, w/ 17%eos... ~  Labs 1/16 showed Hg= 12.7, WBC= 4.4 w/ 9% eos  Health Maintenance: ~  GI:  Followed by DrPatterson ~  GU:  PSA checked yearly & remains wnl... ~  Immunizations:  he gets the yearly Flu vaccine;  [place] says;  given Pneumovax 1/11 age 48.   Past Surgical History:  Procedure Laterality Date  . CARDIAC CATHETERIZATION  2008  . icd implanted  2005  . LAPAROSCOPIC CHOLECYSTECTOMY  04/2005   Dr. Lucia Gaskins  . ROTATOR CUFF REPAIR      Outpatient Encounter Prescriptions as of 03/13/2017  Medication Sig  . aspirin 81 MG tablet Take 81 mg by mouth daily.    . clopidogrel (PLAVIX) 75 MG tablet Take 1 tablet (75 mg total) by mouth daily.  . cyanocobalamin (,VITAMIN B-12,) 1000 MCG/ML injection INJECT 1 ML INTO THE MUSCLE EVERY 30 DAYS  . fish oil-omega-3 fatty acids 1000 MG capsule Take 2 g by mouth daily.    Marland Kitchen gemfibrozil (LOPID) 600 MG tablet Take 1 tablet (600 mg total) by mouth daily.  . metoprolol succinate (TOPROL-XL) 100 MG 24 hr tablet Take 1 tablet (100 mg total) by mouth daily. Take with or immediately following a meal.  . Multiple Vitamins-Minerals (MENS MULTIVITAMIN PLUS PO) Take 1 tablet by mouth daily.    Marland Kitchen omeprazole (PRILOSEC) 20 MG capsule Take 1 capsule (20 mg total) by mouth daily. Take 30 minutes before a meal  . Polyethylene Glycol  POWD Take 1 scoop by mouth daily.   . ramipril (ALTACE) 10 MG capsule Take 1 capsule (10 mg total) by mouth daily.  . simvastatin (ZOCOR) 40 MG tablet Take 1 tablet (40 mg total) by mouth every evening.  Marland Kitchen levothyroxine (SYNTHROID) 50 MCG tablet Take 1 tablet (50 mcg total) by mouth daily before breakfast.   No facility-administered encounter medications on file as of 03/13/2017.     No Known Allergies    Immunization History  Administered Date(s) Administered  . H1N1 08/04/2008  . Influenza Split 05/10/2012, 05/11/2013  . Influenza Whole 05/04/2008, 06/21/2009, 05/08/2010  . Influenza,inj,Quad PF,36+ Mos 04/09/2014, 05/07/2016  . Influenza-Unspecified 05/07/2015  . Pneumococcal Conjugate-13 09/09/2014  . Pneumococcal Polysaccharide-23 05/17/2000, 08/30/2009    Current Medications, Allergies, Past Medical History, Past Surgical History, Family History, and Social History were reviewed in [employer] record.    Review of Systems        See HPI - all other systems neg except as noted...  The patient complains of dyspnea on exertion.  The patient denies anorexia, fever, weight loss, weight gain, vision loss, decreased hearing, hoarseness, chest pain, syncope, peripheral edema, prolonged cough, headaches, hemoptysis, abdominal pain, melena, hematochezia, severe indigestion/heartburn, hematuria, incontinence, muscle weakness, suspicious skin lesions, transient blindness, difficulty walking, depression, unusual weight change, abnormal bleeding, enlarged lymph nodes, and angioedema.     Objective:   Physical Exam  WD, WN, 83 y/o WM in NAD... wt=163# ht=6'1" BMI=21... GENERAL:  Alert & oriented; pleasant & cooperative... HEENT:  [place]/AT, EOM-wnl, PERRLA, EACs-clear, TMs-wnl, NOSE-clear, THROAT-clear & wnl. NECK:  Supple w/ fairROM; no JVD; normal carotid impulses w/o bruits; no thyromegaly or nodules palpated; no lymphadenopathy. CHEST:  Clear to P & A; without  wheezes/ rales/ or rhonchi heard... HEART:  Pacer in L upper chest area~ nontender, RR, gr 1/6 SEM without rubs or gallops detected... ABDOMEN:  Soft & nontender; normal bowel sounds; no organomegaly or masses palpated... EXT: without deformities, mild arthritic changes; no varicose veins/ venous insuffic/ or edema. NEURO:  CN's intact; no focal neuro deficits apprec... DERM:  No lesions noted; no rash etc...  RADIOLOGY DATA:  Reviewed in the EPIC EMR & discussed w/ the patient...   LABORATORY DATA:  Reviewed in the EPIC EMR & discussed w/ the patient...     Assessment & Plan:    HBP>  Controlled on Metoprolol & Ramipril, continue same...  CAD, Cardiomyopathy, CHF, ValvHeartDis w/ AS & MR>  Followed by Tenny Craw & stable on ASA/ Plavix, still working 5d per week etc...  Hx syncope, AVBlock, Pacer/ICD>  Followed by DrTaylor & stable, continue same...  LIPIDS>  FLP looks OK on Simva40, Gemfib 600mg /d, & Fish Oil...  GI symptoms 7/15>  Resolved w/ Colace & Miralax... He feels he is back to baseline... 03/2016>  He has mild anemia w/ Hg=10.4 & stool card pos for occult blood => refer to DrPerry for further eval... Not felt to be an endoscopic candidate- rec to take Omep20 by DrPerry... 2/18>  Vincenzo has finally trired!  Refills provided for 90d supplies per his request- change to Protonix40... 8/18>   Uthman is well into his IMRT for the prostate cancer, starting to experience some side effects, feeling weak- labs show TSH=7.10 & we are starting Levothy4mcg/d, and his Hg=10 rec to start an MVI w/ Fe; he will continue the IMRT & planned androgen depriv therapy per Urology & RadOnc.Estill Batten PSA/ prostate cancer => followed by [facility] for Urology on watchful waiting...  DJD>  He uses Tylenol as needed; still working regularly...  B-12 defic>  He remains on B12 shots monthly...   Patient's Medications  New Prescriptions   LEVOTHYROXINE (SYNTHROID) 50 MCG TABLET    Take 1  tablet (50 mcg total) by mouth daily before breakfast.  Previous Medications   ASPIRIN 81 MG TABLET    Take 81 mg by mouth daily.     CLOPIDOGREL (PLAVIX) 75 MG TABLET    Take 1 tablet (75 mg total) by mouth daily.   CYANOCOBALAMIN (,VITAMIN B-12,) 1000 MCG/ML INJECTION    INJECT 1 ML INTO THE MUSCLE EVERY 30 DAYS   FISH OIL-OMEGA-3 FATTY ACIDS 1000 MG CAPSULE    Take 2 g by mouth daily.     GEMFIBROZIL (LOPID) 600 MG TABLET    Take 1 tablet (600 mg total) by mouth daily.   METOPROLOL SUCCINATE (TOPROL-XL) 100 MG 24 HR TABLET    Take 1 tablet (100 mg total) by mouth daily. Take with or immediately following a meal.   MULTIPLE VITAMINS-MINERALS (MENS MULTIVITAMIN PLUS PO)    Take 1 tablet by mouth daily.     OMEPRAZOLE (PRILOSEC) 20 MG CAPSULE    Take 1 capsule (20 mg total) by mouth daily. Take 30 minutes before a meal   POLYETHYLENE GLYCOL POWD    Take 1 scoop by mouth daily.    RAMIPRIL (ALTACE)  10 MG CAPSULE    Take 1 capsule (10 mg total) by mouth daily.   SIMVASTATIN (ZOCOR) 40 MG TABLET    Take 1 tablet (40 mg total) by mouth every evening.  Modified Medications   No medications on file  Discontinued Medications   No medications on file

## 2017-03-15 ENCOUNTER — Ambulatory Visit
Admission: RE | Admit: 2017-03-15 | Discharge: 2017-03-15 | Disposition: A | Discharge status: Home / Self Care | Payer: Medicare Other | Source: Ambulatory Visit | Attending: Radiation Oncology | Admitting: Radiation Oncology

## 2017-03-15 DIAGNOSIS — C61 Malignant neoplasm of prostate: Secondary | ICD-10-CM | POA: Diagnosis not present

## 2017-03-18 ENCOUNTER — Ambulatory Visit
Admission: RE | Admit: 2017-03-18 | Discharge: 2017-03-18 | Disposition: A | Discharge status: Home / Self Care | Payer: Medicare Other | Source: Ambulatory Visit | Attending: Radiation Oncology | Admitting: Radiation Oncology

## 2017-03-18 DIAGNOSIS — C61 Malignant neoplasm of prostate: Secondary | ICD-10-CM | POA: Diagnosis not present

## 2017-03-19 ENCOUNTER — Ambulatory Visit
Admission: RE | Admit: 2017-03-19 | Discharge: 2017-03-19 | Disposition: A | Discharge status: Home / Self Care | Payer: Medicare Other | Source: Ambulatory Visit | Attending: Radiation Oncology | Admitting: Radiation Oncology

## 2017-03-19 DIAGNOSIS — C61 Malignant neoplasm of prostate: Secondary | ICD-10-CM | POA: Diagnosis not present

## 2017-03-20 ENCOUNTER — Ambulatory Visit
Admission: RE | Admit: 2017-03-20 | Discharge: 2017-03-20 | Disposition: A | Discharge status: Home / Self Care | Payer: Medicare Other | Source: Ambulatory Visit | Attending: Radiation Oncology | Admitting: Radiation Oncology

## 2017-03-20 DIAGNOSIS — C61 Malignant neoplasm of prostate: Secondary | ICD-10-CM | POA: Diagnosis not present

## 2017-03-21 ENCOUNTER — Ambulatory Visit
Admission: RE | Admit: 2017-03-21 | Discharge: 2017-03-21 | Disposition: A | Discharge status: Home / Self Care | Payer: Medicare Other | Source: Ambulatory Visit | Attending: Radiation Oncology | Admitting: Radiation Oncology

## 2017-03-21 DIAGNOSIS — C61 Malignant neoplasm of prostate: Secondary | ICD-10-CM | POA: Diagnosis not present

## 2017-03-22 ENCOUNTER — Ambulatory Visit
Admission: RE | Admit: 2017-03-22 | Discharge: 2017-03-22 | Disposition: A | Discharge status: Home / Self Care | Payer: Medicare Other | Source: Ambulatory Visit | Attending: Radiation Oncology | Admitting: Radiation Oncology

## 2017-03-22 DIAGNOSIS — C61 Malignant neoplasm of prostate: Secondary | ICD-10-CM | POA: Diagnosis not present

## 2017-03-25 ENCOUNTER — Ambulatory Visit
Admission: RE | Admit: 2017-03-25 | Discharge: 2017-03-25 | Disposition: A | Discharge status: Home / Self Care | Payer: Medicare Other | Source: Ambulatory Visit | Attending: Radiation Oncology | Admitting: Radiation Oncology

## 2017-03-25 DIAGNOSIS — C61 Malignant neoplasm of prostate: Secondary | ICD-10-CM | POA: Diagnosis not present

## 2017-03-26 ENCOUNTER — Ambulatory Visit
Admission: RE | Admit: 2017-03-26 | Discharge: 2017-03-26 | Disposition: A | Discharge status: Home / Self Care | Payer: Medicare Other | Source: Ambulatory Visit | Attending: Radiation Oncology | Admitting: Radiation Oncology

## 2017-03-26 DIAGNOSIS — C61 Malignant neoplasm of prostate: Secondary | ICD-10-CM | POA: Diagnosis not present

## 2017-03-27 ENCOUNTER — Ambulatory Visit
Admission: RE | Admit: 2017-03-27 | Discharge: 2017-03-27 | Disposition: A | Discharge status: Home / Self Care | Payer: Medicare Other | Source: Ambulatory Visit | Attending: Radiation Oncology | Admitting: Radiation Oncology

## 2017-03-27 DIAGNOSIS — C61 Malignant neoplasm of prostate: Secondary | ICD-10-CM | POA: Diagnosis not present

## 2017-03-28 ENCOUNTER — Ambulatory Visit
Admission: RE | Admit: 2017-03-28 | Discharge: 2017-03-28 | Disposition: A | Discharge status: Home / Self Care | Payer: Medicare Other | Source: Ambulatory Visit | Attending: Radiation Oncology | Admitting: Radiation Oncology

## 2017-03-28 ENCOUNTER — Ambulatory Visit (INDEPENDENT_AMBULATORY_CARE_PROVIDER_SITE_OTHER): Payer: Medicare Other

## 2017-03-28 DIAGNOSIS — C61 Malignant neoplasm of prostate: Secondary | ICD-10-CM | POA: Diagnosis not present

## 2017-03-28 DIAGNOSIS — E538 Deficiency of other specified B group vitamins: Secondary | ICD-10-CM

## 2017-03-29 ENCOUNTER — Ambulatory Visit
Admission: RE | Admit: 2017-03-29 | Discharge: 2017-03-29 | Disposition: A | Discharge status: Home / Self Care | Payer: Medicare Other | Source: Ambulatory Visit | Attending: Radiation Oncology | Admitting: Radiation Oncology

## 2017-03-29 DIAGNOSIS — C61 Malignant neoplasm of prostate: Secondary | ICD-10-CM | POA: Diagnosis not present

## 2017-03-29 MED ORDER — CYANOCOBALAMIN 1000 MCG/ML IJ SOLN
1000.0000 ug | Freq: Once | INTRAMUSCULAR | Status: AC
  Administered 2017-03-28: 1000 ug via INTRAMUSCULAR

## 2017-03-29 NOTE — Progress Notes (Signed)
Documentation of medication administration and charges of Vitamin B12 have been completed by Wilma Wuthrich, CMA based on the Vitamin B12 documentation sheet completed by Tammy Scott.  

## 2017-04-01 ENCOUNTER — Ambulatory Visit
Admission: RE | Admit: 2017-04-01 | Discharge: 2017-04-01 | Disposition: A | Discharge status: Home / Self Care | Payer: Medicare Other | Source: Ambulatory Visit | Attending: Radiation Oncology | Admitting: Radiation Oncology

## 2017-04-01 ENCOUNTER — Encounter: Payer: Self-pay | Admitting: Medical Oncology

## 2017-04-01 DIAGNOSIS — C61 Malignant neoplasm of prostate: Secondary | ICD-10-CM | POA: Diagnosis not present

## 2017-04-01 NOTE — Progress Notes (Signed)
[name] states he is doing pretty well with radiation with six treatment left. He states that his appetite has changed and food just does not taste the same. He states that the saltiness and sweetness if intensified. I encouraged him to try protein supplements like boost to maintain his weight. He states that his appetite is slowly beginning to return. I will continue to follow and asked him to call me with questions or concerns.

## 2017-04-02 ENCOUNTER — Ambulatory Visit
Admission: RE | Admit: 2017-04-02 | Discharge: 2017-04-02 | Disposition: A | Discharge status: Home / Self Care | Payer: Medicare Other | Source: Ambulatory Visit | Attending: Radiation Oncology | Admitting: Radiation Oncology

## 2017-04-02 DIAGNOSIS — C61 Malignant neoplasm of prostate: Secondary | ICD-10-CM | POA: Diagnosis not present

## 2017-04-03 ENCOUNTER — Ambulatory Visit
Admission: RE | Admit: 2017-04-03 | Discharge: 2017-04-03 | Disposition: A | Discharge status: Home / Self Care | Payer: Medicare Other | Source: Ambulatory Visit | Attending: Radiation Oncology | Admitting: Radiation Oncology

## 2017-04-03 DIAGNOSIS — C61 Malignant neoplasm of prostate: Secondary | ICD-10-CM | POA: Diagnosis not present

## 2017-04-04 ENCOUNTER — Ambulatory Visit
Admission: RE | Admit: 2017-04-04 | Discharge: 2017-04-04 | Disposition: A | Discharge status: Home / Self Care | Payer: Medicare Other | Source: Ambulatory Visit | Attending: Radiation Oncology | Admitting: Radiation Oncology

## 2017-04-04 DIAGNOSIS — C61 Malignant neoplasm of prostate: Secondary | ICD-10-CM | POA: Diagnosis not present

## 2017-04-05 ENCOUNTER — Ambulatory Visit
Admission: RE | Admit: 2017-04-05 | Discharge: 2017-04-05 | Disposition: A | Discharge status: Home / Self Care | Payer: Medicare Other | Source: Ambulatory Visit | Attending: Radiation Oncology | Admitting: Radiation Oncology

## 2017-04-05 DIAGNOSIS — C61 Malignant neoplasm of prostate: Secondary | ICD-10-CM | POA: Diagnosis not present

## 2017-04-09 ENCOUNTER — Ambulatory Visit
Admission: RE | Admit: 2017-04-09 | Discharge: 2017-04-09 | Disposition: A | Discharge status: Home / Self Care | Payer: Medicare Other | Source: Ambulatory Visit | Attending: Radiation Oncology | Admitting: Radiation Oncology

## 2017-04-09 DIAGNOSIS — C61 Malignant neoplasm of prostate: Secondary | ICD-10-CM | POA: Diagnosis not present

## 2017-04-10 ENCOUNTER — Ambulatory Visit
Admission: RE | Admit: 2017-04-10 | Discharge: 2017-04-10 | Disposition: A | Discharge status: Home / Self Care | Payer: Medicare Other | Source: Ambulatory Visit | Attending: Radiation Oncology | Admitting: Radiation Oncology

## 2017-04-10 ENCOUNTER — Encounter: Payer: Self-pay | Admitting: Radiation Oncology

## 2017-04-10 DIAGNOSIS — C61 Malignant neoplasm of prostate: Secondary | ICD-10-CM | POA: Diagnosis not present

## 2017-04-12 NOTE — Progress Notes (Signed)
°  Radiation Oncology         [phone] ________________________________  Name: [name] MRN: [id number]  Date: 04/10/2017  DOB: 27-Jun-1932  End of Treatment Note  Diagnosis:  83 y.o. gentleman with Stage T1c adenocarcinoma of the prostate with Gleason Score of 4+4, and PSA of 26.90      Indication for treatment:  Curative, Definitive Radiotherapy       Radiation treatment dates:   02/13/2017 to 04/10/2017  Site/dose:   The prostate was treated to 45 Gy in 25 fractions of 1.8 Gy. The prostate was boosted to 30 Gy in 15 fractions of 2 Gy.  Beams/energy:   The patient was treated with IMRT using volumetric arc therapy delivering 6 MV X-rays to clockwise and counterclockwise circumferential arcs with a 90 degree collimator offset to avoid dose scalloping.  Image guidance was performed with daily cone beam CT prior to each fraction to align to gold markers in the prostate and assure proper bladder and rectal fill volumes.  Immobilization was achieved with BodyFix custom mold.  Narrative: The patient tolerated radiation treatment relatively well.   The patient experienced modest fatigue and some minor urinary irritation including nocturia x 3, dysuria at the start of urination, urgency, and weak stream. He denied hematuria, urinary leakage, incomplete emptying or incontinence. He denied abdominal pain or diarrhea. Of note, the patient did report dizziness upon standing which was likely related to his BP medication. Patient was encouraged to stay hydrated, make sure he has sodium in his diet, and to take it slow when going from sitting to standing to avoid falls.   Plan: The patient has completed radiation treatment. He will return to radiation oncology clinic for routine followup in one month. I advised him to call or return sooner if he has any questions or concerns related to his recovery or treatment. ________________________________  Sheral Apley. Tammi Klippel, M.D.  This document serves as  a record of services personally performed by Tyler Pita, MD. It was created on his behalf by Arlyce Harman, a trained medical scribe. The creation of this record is based on the scribe's personal observations and the provider's statements to them. This document has been checked and approved by the attending provider.

## 2017-04-15 ENCOUNTER — Telehealth: Payer: Self-pay | Admitting: Pulmonary Disease

## 2017-04-15 NOTE — Telephone Encounter (Signed)
Spoke with daughter, she states [facility] needed [place] to call them. I called them and they needed to clarify the drug interaction between simvastatin and Lopid. I advised them to fill the Rx because Dr. Lenna Gilford is aware. Nothing further is needed.

## 2017-04-16 ENCOUNTER — Telehealth: Payer: Self-pay | Admitting: Pulmonary Disease

## 2017-04-16 NOTE — Telephone Encounter (Signed)
Received a call from pt's pharmacy. Gemfibrozil is on back order. They will need an alternative.  SN - please advise. Thanks!

## 2017-04-16 NOTE — Telephone Encounter (Signed)
See other phone note on this same message.

## 2017-04-16 NOTE — Telephone Encounter (Signed)
Leigh can you see if SN would like to change medication?

## 2017-04-16 NOTE — Telephone Encounter (Signed)
Per SN---\ Simvastatin 40  Fish oil and leave off the gemfibrozil  Plan follow up FLP on these meds in about 3 months.  Labs have been placed.     I called the pt to make him aware and he stated that he couldn't understand what I was saying and he would have his daughter call us back.

## 2017-04-18 ENCOUNTER — Other Ambulatory Visit: Payer: Self-pay | Admitting: Pulmonary Disease

## 2017-04-18 ENCOUNTER — Ambulatory Visit (INDEPENDENT_AMBULATORY_CARE_PROVIDER_SITE_OTHER): Payer: Medicare Other | Admitting: *Deleted

## 2017-04-18 DIAGNOSIS — I255 Ischemic cardiomyopathy: Secondary | ICD-10-CM

## 2017-04-18 MED ORDER — SIMVASTATIN 40 MG PO TABS: 40.0000 mg | ORAL_TABLET | Freq: Every day | ORAL | 2 refills | Status: DC

## 2017-04-18 NOTE — Telephone Encounter (Signed)
pts daughter called me back and she stated that the pt has plenty of the gemfibrozil since he just received the shipment in---she says that he has about a 60 to 90 day supply.  I advised her that I would speak with SN about the pt staying on this med since he has plenty---will call them back if we need to stop him from taking this med.  SN please advise. thanks

## 2017-04-18 NOTE — Telephone Encounter (Signed)
SN is aware of conversation with pts daughter and he is ok with the pt staying on the medication as directed now and when/if he runs out of the gemfibrozil and this cannot be refilled by the pharmacy, then the pt will need to stay off of this and try just the simvastain and fish oil. Nothing further is needed.

## 2017-04-18 NOTE — Progress Notes (Signed)
Remote ICD transmission.   

## 2017-04-19 ENCOUNTER — Encounter: Payer: Self-pay | Admitting: Cardiology

## 2017-04-29 ENCOUNTER — Ambulatory Visit (INDEPENDENT_AMBULATORY_CARE_PROVIDER_SITE_OTHER): Payer: Medicare Other

## 2017-04-29 DIAGNOSIS — E538 Deficiency of other specified B group vitamins: Secondary | ICD-10-CM

## 2017-04-29 DIAGNOSIS — Z23 Encounter for immunization: Secondary | ICD-10-CM

## 2017-04-29 LAB — CUP PACEART REMOTE DEVICE CHECK
Date Time Interrogation Session: 20180924155440
Implantable Lead Implant Date: 20050509
Implantable Lead Location: 753859
Implantable Pulse Generator Implant Date: 20110609
Lead Channel Setting Pacing Amplitude: 2 V
Lead Channel Setting Pacing Amplitude: 2.125
Lead Channel Setting Pacing Pulse Width: 0.5 ms
MDC IDC LEAD IMPLANT DT: 20050509
MDC IDC LEAD LOCATION: 753860
MDC IDC PG SERIAL: 610170
MDC IDC SET LEADCHNL RV SENSING SENSITIVITY: 0.5 mV

## 2017-05-01 DIAGNOSIS — Z23 Encounter for immunization: Secondary | ICD-10-CM | POA: Diagnosis not present

## 2017-05-06 MED ORDER — CYANOCOBALAMIN 1000 MCG/ML IJ SOLN
1000.0000 ug | Freq: Once | INTRAMUSCULAR | Status: AC
  Administered 2017-04-29: 1000 ug via INTRAMUSCULAR

## 2017-05-06 NOTE — Addendum Note (Signed)
Addended by: Clayborne Dana C on: 05/06/2017 02:39 PM   Modules accepted: Orders

## 2017-05-08 NOTE — Progress Notes (Signed)
Chief Complaint  Patient presents with  . Follow-up    12 month follow up     History of Present Illness: 83 yo male with history of complete heart block, CAD s/p stenting of the LAD in 2005 and 2008, ischemic cardiomyopathy s/p ICD ( implanted 2005/generator change 2011), HLD, HTN, chronic systolic CHF here today for cardiac follow up. He has been followed by Dr. Ron Parker. His ICD is followed by Dr. [name]. Echo July 2016 with LVEF=40-45%, mild AI, trivial MR. Diagnosis of prostate cancer in 2014. He completed treatment for his prostate cancer this summer.   He is here today for follow up. The patient denies any chest pain, dyspnea, palpitations, lower extremity edema, orthopnea, PND, dizziness, near syncope or syncope. He stopped his Ramipril due to dizziness.    Primary Care Physician: Noralee Space, MD   Past Medical History:  Diagnosis Date  . Anemia, unspecified   . Aortic stenosis    Mild, echo, October, 2011  . AV block, complete (HCC)    Permanent pacemaker  . CAD (coronary artery disease)    DES to LAD 2005 /  DES to LAD 2008  . Cardiomyopathy    Ischemic, EF 30%, 2005  . DJD (degenerative joint disease)   . Dyslipidemia   . Ejection fraction < 50%    EF 30%, echo, 2005  /  EF 30%, catheterization, 2008  . GERD (gastroesophageal reflux disease)   . Hyperlipidemia   . Hypertension   . ICD (implantable cardiac defibrillator), single, in situ    for syncope and arrhythmia 2005 / generator change June, 2011  . LBBB (left bundle branch block)   . Mitral regurgitation    Mild, echo, October, 2011  . Prostate cancer ([place])   . Rectal fissure   . Syncope    2005, ICD placed  . Systolic CHF, chronic ([place])   . Vitamin B12 deficiency     Past Surgical History:  Procedure Laterality Date  . CARDIAC CATHETERIZATION  2008  . icd implanted  2005  . LAPAROSCOPIC CHOLECYSTECTOMY  04/2005   Dr. Lucia Gaskins  . ROTATOR CUFF REPAIR      Current Outpatient Prescriptions    Medication Sig Dispense Refill  . aspirin 81 MG tablet Take 81 mg by mouth daily.      . clopidogrel (PLAVIX) 75 MG tablet Take 1 tablet (75 mg total) by mouth daily. 90 tablet 4  . cyanocobalamin (,VITAMIN B-12,) 1000 MCG/ML injection INJECT 1 ML INTO THE MUSCLE EVERY 30 DAYS 1 mL 5  . fish oil-omega-3 fatty acids 1000 MG capsule Take 2 g by mouth daily.      Marland Kitchen gemfibrozil (LOPID) 600 MG tablet Take 1 tablet (600 mg total) by mouth daily. 90 tablet 3  . levothyroxine (SYNTHROID) 50 MCG tablet Take 1 tablet (50 mcg total) by mouth daily before breakfast. 90 tablet 3  . metoprolol succinate (TOPROL-XL) 100 MG 24 hr tablet Take 1 tablet (100 mg total) by mouth daily. Take with or immediately following a meal. 90 tablet 3  . Multiple Vitamins-Minerals (MENS MULTIVITAMIN PLUS PO) Take 1 tablet by mouth daily.      Marland Kitchen omeprazole (PRILOSEC) 20 MG capsule Take 1 capsule (20 mg total) by mouth daily. Take 30 minutes before a meal 90 capsule 3  . Polyethylene Glycol POWD Take 1 scoop by mouth daily.     . simvastatin (ZOCOR) 40 MG tablet Take 1 tablet (40 mg total) by mouth at bedtime. [place]  tablet 2  . ramipril (ALTACE) 10 MG capsule Take 1 capsule (10 mg total) by mouth daily. (Patient not taking: Reported on 05/09/2017) 90 capsule 3   No current facility-administered medications for this visit.     No Known Allergies  Social History   Social History  . Marital status: Widowed    Spouse name: N/A  . Number of children: 2  . Years of education: N/A   Occupational History  . repairs looms at [employer]    Social History Main Topics  . Smoking status: Former Smoker    Packs/day: 1.00    Years: 2.00    Types: Cigarettes    Quit date: 08/06/1952  . Smokeless tobacco: Never Used  . Alcohol use No  . Drug use: No  . Sexual activity: Not Currently   Other Topics Concern  . Not on file   Social History Narrative  . No narrative on file    Family History  Problem Relation Age of Onset  .  Stroke Mother   . Cancer Mother 63       unknown  . Heart attack Father 19  . Cancer Brother        lung  . Cancer Sister        lung  . Stroke Brother   . Lung cancer Brother   . Cancer Maternal Aunt        breast    Review of Systems:  As stated in the HPI and otherwise negative.   BP 130/64   Pulse (!) 54   Ht 5' 11.5" (1.816 m)   Wt 161 lb (73 kg)   SpO2 99%   BMI 22.14 kg/m   Physical Examination:  General: Well developed, well nourished, NAD  HEENT: OP clear, mucus membranes moist  SKIN: warm, dry. No rashes. Neuro: No focal deficits  Musculoskeletal: Muscle strength 5/5 all ext  Psychiatric: Mood and affect normal  Neck: No JVD, no carotid bruits, no thyromegaly, no lymphadenopathy.  Lungs:Clear bilaterally, no wheezes, rhonci, crackles Cardiovascular: Regular rate and rhythm. No murmurs, gallops or rubs. Abdomen:Soft. Bowel sounds present. Non-tender.  Extremities: No lower extremity edema. Pulses are 2 + in the bilateral DP/PT.  Echo July 2016: Left ventricle: The cavity size was normal. Systolic function was   mildly to moderately reduced. The estimated ejection fraction was   in the range of 40% to 45%. Endocardial segments are not well   visulaized. There is akinesis of the apical septal myocardium.   There is akinesis of the midinferoseptal myocardium. There is   akinesis of the entire apical myocardium. Recommend limited echo   with definity contrast to adequately evaluate for wall motion   abnormalities. There was an increased relative contribution of   atrial contraction to ventricular filling. Doppler parameters are   consistent with abnormal left ventricular relaxation (grade 1   diastolic dysfunction). - Ventricular septum: Septal motion showed paradox. - Aortic valve: Severely focal calcification of the aortic vavle   annulus. Trileaflet. Mild thickening and calcification,   consistent with sclerosis. There was mild regurgitation. - Mitral  valve: Calcified annulus. Mildly thickened leaflets . - Pulmonic valve: There was trivial regurgitation.  EKG:  EKG is  ordered today. The ekg ordered today demonstrates V-paced rhythm.   Recent Labs: 09/17/2016: ALT 14 03/07/2017: BUN 13; Creatinine, Ser 1.04; Hemoglobin 10.0; Platelets 162.0; Potassium 4.3; Sodium 142; TSH 7.10   Lipid Panel    Component Value Date/Time   CHOL 147 09/17/2016  4128   TRIG 173.0 (H) 09/17/2016 0718   HDL 43.20 09/17/2016 0718   CHOLHDL 3 09/17/2016 0718   VLDL 34.6 09/17/2016 0718   LDLCALC 69 09/17/2016 0718   LDLDIRECT 96.3 02/26/2011 0735     Wt Readings from Last 3 Encounters:  05/09/17 161 lb (73 kg)  03/13/17 167 lb 4 oz (75.9 kg)  12/05/16 171 lb 9.6 oz (77.8 kg)     Other studies Reviewed: Additional studies/ records that were reviewed today include: . Review of the above records demonstrates:   Assessment and Plan:   1. CAD without angina: No chest pain concerning for angina. Continue ASA, Plavix, statin and beta blocker.    2. Ischemic cardiomyopathy/Chronic systolic CHF: Echo July 7867 with LVEF=40-45%. He is NYHA class 1. Weight is stable. Will continue medical therapy for his CM with beta blocker and Ace-inh. ICD in place.  He is willing to try Ramipril again. I think his dizziness was likely due to dehydration during treatment for prostate cancer.   3. HTN: BP is controlled. No changes.   4. HLD: His lipids are followed in primary care. Continue statin.   Current medicines are reviewed at length with the patient today.  The patient does not have concerns regarding medicines.  The following changes have been made:  no change  Labs/ tests ordered today include:   Orders Placed This Encounter  Procedures  . EKG 12-Lead    Disposition:   FU with me in 12 months  Signed, Lauree Chandler, MD 05/09/2017 11:50 AM    [facility] [address], [place], [place]  [address] Phone: [phone]; Fax:  [phone]

## 2017-05-09 ENCOUNTER — Encounter: Payer: Self-pay | Admitting: Cardiovascular Disease

## 2017-05-09 ENCOUNTER — Ambulatory Visit (INDEPENDENT_AMBULATORY_CARE_PROVIDER_SITE_OTHER): Payer: Medicare Other | Admitting: Cardiovascular Disease

## 2017-05-09 VITALS — BP 130/64 | HR 54 | Ht 71.5 in | Wt 161.0 lb

## 2017-05-09 DIAGNOSIS — I10 Essential (primary) hypertension: Secondary | ICD-10-CM | POA: Diagnosis not present

## 2017-05-09 DIAGNOSIS — I2510 Atherosclerotic heart disease of native coronary artery without angina pectoris: Secondary | ICD-10-CM

## 2017-05-09 DIAGNOSIS — I255 Ischemic cardiomyopathy: Secondary | ICD-10-CM | POA: Diagnosis not present

## 2017-05-09 NOTE — Patient Instructions (Signed)

## 2017-05-13 ENCOUNTER — Encounter: Payer: Self-pay | Admitting: Pulmonary Disease

## 2017-05-13 ENCOUNTER — Other Ambulatory Visit (INDEPENDENT_AMBULATORY_CARE_PROVIDER_SITE_OTHER): Payer: Medicare Other

## 2017-05-13 ENCOUNTER — Ambulatory Visit (INDEPENDENT_AMBULATORY_CARE_PROVIDER_SITE_OTHER): Payer: Medicare Other | Admitting: Pulmonary Disease

## 2017-05-13 VITALS — BP 122/62 | HR 53 | Temp 97.3°F | Ht 71.0 in | Wt 161.2 lb

## 2017-05-13 DIAGNOSIS — M159 Polyosteoarthritis, unspecified: Secondary | ICD-10-CM

## 2017-05-13 DIAGNOSIS — E538 Deficiency of other specified B group vitamins: Secondary | ICD-10-CM

## 2017-05-13 DIAGNOSIS — I2510 Atherosclerotic heart disease of native coronary artery without angina pectoris: Secondary | ICD-10-CM | POA: Diagnosis not present

## 2017-05-13 DIAGNOSIS — I5022 Chronic systolic (congestive) heart failure: Secondary | ICD-10-CM | POA: Diagnosis not present

## 2017-05-13 DIAGNOSIS — I350 Nonrheumatic aortic (valve) stenosis: Secondary | ICD-10-CM | POA: Diagnosis not present

## 2017-05-13 DIAGNOSIS — E785 Hyperlipidemia, unspecified: Secondary | ICD-10-CM | POA: Diagnosis not present

## 2017-05-13 DIAGNOSIS — F419 Anxiety disorder, unspecified: Secondary | ICD-10-CM

## 2017-05-13 DIAGNOSIS — I447 Left bundle-branch block, unspecified: Secondary | ICD-10-CM | POA: Diagnosis not present

## 2017-05-13 DIAGNOSIS — Z95810 Presence of automatic (implantable) cardiac defibrillator: Secondary | ICD-10-CM | POA: Diagnosis not present

## 2017-05-13 DIAGNOSIS — I10 Essential (primary) hypertension: Secondary | ICD-10-CM | POA: Diagnosis not present

## 2017-05-13 DIAGNOSIS — I255 Ischemic cardiomyopathy: Secondary | ICD-10-CM

## 2017-05-13 DIAGNOSIS — M150 Primary generalized (osteo)arthritis: Secondary | ICD-10-CM | POA: Diagnosis not present

## 2017-05-13 DIAGNOSIS — C61 Malignant neoplasm of prostate: Secondary | ICD-10-CM

## 2017-05-13 LAB — TSH: TSH: 0.97 u[IU]/mL (ref 0.35–4.50)

## 2017-05-13 NOTE — Progress Notes (Signed)
Subjective:    Patient ID: [name], male    DOB: 1931-09-09, 83 y.o.   MRN: [id number]  HPI 83 y/o WM here for a follow up visit & review of mult medical problems... 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 ~  SEE PREV EPIC NOTES FOR OLDER DATA >>     2DEcho 10/11 showed EF=35-40% & mild HK  LABS 1/15:  FLP- ok on Simva40+Lopid600;  Chems- wnl;  CBC- ok w/ Hg=12.6;  TSH=3.78;  PSA=3.47...  LABS 6/15 & 7/15 showed> FLP- fair w/ LDL=104;  Chems- wnl;  CBC- ok x WBC=16.9 w/ 57%eos; f/u CBC 8/15 showed Hg=11.6, WBC=6.9 w/ 17%eos...  CXR 8/15 showed norm heart size, Pacer/ICD stable, stent noted, lungs clear, NAD...   ~   [id number]:  [name] was injured at work (fell pulling yarn out of a machine) & sustained fx rib, went to ER 08/14/14, note reviewed, XRay w/ left anterolat 6th rib fx, treated w/ rest/ heat/ Tramadol/ Tylenol/ etc...     CXR 08/14/14 showed norm heart size, Pacer/AICD on left, clear lungs w/ mild peribronch thickening, mult old rib fxs on left, NAD...   LABS 1/16:  FLP looks good on Simva40/Lopid600; Chems- wnl;  CBC- wnl;  TSH=5.40;  PSA=5.27...    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 We reviewed the following medical problems during today's office visit >>     HBP> on MetopER100AM, Altace10PM;  BP= 130/78 & he denies CP, palpit, dizzy, SOB, edema; states feeling well & energy is good...    CAD, Ischemic cardiomyop EF=35-40%, chr sys heart failure, AS & MR> on ASA/ Plavix; followed by Benay Spice & seen 7/16 (note reviewed); he's had prev MI & mult interventions, last 2DEcho 10/11 showed EF=35-40% & mild HK;  Repeat 2DEcho 7/16 showed decr LVF w/ EF=40-45%, AK of apex & septum, Gr1DD, paradox septal  motion, AoV sclerosis &mild AI, mild MV leaflet thickening (no change in meds).    Hx Syncope, AV block, Implanted Defib> followed by DrTaylor (seen 2/16- note reviewed) w/ his StJude biventricICD working normally) w/ pacer checks every 2-49mo he says; generator changed 6/11; he denies CP/angina, dizzy/ syncope.     Chol> on Simva40, Lopid600, FishOil; FLP 1/16 shows TChol 148, TG 111, HDL 45, LDL 81... No changes made, we stressed diet...    GI- GERD, constip> he had LapChole 2006; hx chr constip & anal stenosis- had eval by DrPatterson; cannot find reference to prev colonoscopy in Epic or [facility] but he says neg colon 2005 by DrPatterson; Rectal exam showed anal stenosis- dilated via anoscopy; he was treated w/ Linzess & Chronulac & AnusolHC and improved (now just using Miralax)...     GU- elev PSA> He saw DrHerrick & now follows w/ him for elev PSA- they are approaching the prob conservatively w/ PSA Q7mo & consider hormone therapy if it continues to rise...    DJD> he just uses Tylenol as needed...    VitB12 defic> on B12 shots monthly... We reviewed prob list, meds, xrays and labs> see below for updates >>   EKG 02/11/15 showed SBrady, rate50, AV sequential pacing...   2DEcho 7/16 showed decr LVF w/  EF=40-45%, AK of apex & septum, Gr1DD, paradox septal motion, AoV sclerosis &mild AI, mild MV leaflet thickening (no change in meds).  LABS 8/16:  Chems- wnl;  CBC- wnl;  TSH=4.07   ADDENDUM>  He saw KW 08/24/15 w/ sore throat & exam c/w candida/thrush- strep swab neg, treated w/ MMW & Diflucan, resolved...   ~  September 14, 2015:  47mo [name] reports doing well- Kyle working 5d/wk for [employer];  We reviewed the following medical problems during today's office visit >>     HBP> on MetopER100AM, Altace10PM;  BP= 140/68 & he denies CP, palpit, dizzy, SOB, edema; states feeling well & energy is good...    CAD w/ MI & interventions, Ischemic cardiomyop EF=35-40%, chr sys heart  failure, AS & MR> on ASA/ Plavix; followed by DrKatz/Taylor & seen 7/16 (note reviewed); he's had prev MI & mult interventions, 2DEcho 10/11 showed EF=35-40% & mild HK;  Repeat 2DEcho 7/16 showed decr LVF w/ EF=40-45%, AK of apex & septum, Gr1DD, paradox septal motion, AoV sclerosis &mild AI, mild MV leaflet thickening (no change in meds).    Hx Syncope, AV block, Implanted Defib> followed by DrTaylor (seen 2/16- note reviewed) w/ his StJude biventricICD working normally) w/ pacer checks every 2-69mo he says; generator changed 6/11; he denies CP/angina, dizzy/ syncope.     Chol> on Simva40, Lopid600, FishOil; FLP 2/17 shows TChol 148, TG 55, HDL 53, LDL 85... No changes made, we stressed diet...    GI- GERD, constip> he had LapChole 2006; hx chr constip & anal stenosis- had eval by DrPatterson; Colonoscopy 2005 by DrPatterson was neg/ wnl; Rectal exam showed anal stenosis- dilated via anoscopy; he was treated w/ Linzess & Chronulac & AnusolHC and improved (now just using Miralax)...     GU- elev PSA> He saw DrHerrick & now follows w/ him for elev PSA- they are approaching the prob conservatively w/ PSA Q3-64mo & consider Bx & hormone therapy if it continues to rise; last seen 12/16- PSA =9.45, note reviewed...    DJD> he just uses Tylenol as needed...    VitB12 defic> on B12 shots monthly... EXAM shows Afeb, VSS, O2sat=98% on RA;  HEENT- neg, mallampati1;  Chest- clear w/o w/r/r;  Heart- RR w/o gr1/6SEM w/o r/g;  Abd- soft, neg;  Ext- +arthritic changes, w/o c/c/e;  Neuro- intact...  LABS 09/09/15> FLP- all parameters at goals;  Chem- wnl;  CBC- ok w/ Hg=12.0, MCV=94;  TSH-3.64;  PSA=12.82 & pt is encouraged to f/u w/ Urology- [place] ASAP... IMP/PLAN>>  [name] is clinically stable, Kyle working 5d/wk, no new complaints or concerns;  He has HBP, CAD, cardiomyop w/ EF 40-45% & ICD in place- followed by DrTaylor;  He has a rising PSA followed by DrHerrick & given a copy of labs to take to Urology... Same  meds/ ROV 31mo...  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  We reviewed the following medical problems during today's office visit >>     HBP> on MetopER100AM, Altace10PM;  BP= 114/68 & he denies CP, palpit, dizzy, SOB, edema; states feeling well & energy is good...    CAD w/ MI & interventions, Ischemic cardiomyop decr EF, chr sys heart failure, AS & MR> on ASA/ Plavix; followed by DrMcAlhany/Taylor & seen 8/17 (as above); he's had prev MI & mult interventions, 2DEcho 10/11 showed EF=35-40% & mild HK;  Repeat 2DEcho 7/16 showed decr LVF w/ EF=40-45%, AK of apex & septum, Gr1DD, paradox septal motion, AoV sclerosis &mild AI, mild MV leaflet thickening; Stable- no change in meds.    Hx Syncope, AV block, Implanted Defib> followed by DrTaylor (seen 2/17- note reviewed) w/ his StJude biventricICD working normally) w/ pacer checks every 2-68mo he says; generator changed 6/11; he denies CP/angina, dizzy/ syncope, etc...    Chol> on Simva40, Lopid600, FishOil; FLP 2/17 shows TChol 148, TG 55, HDL 53, LDL 85... No changes made, we stressed diet...    GI- GERD, constip> he had LapChole 2006; hx chr constip & anal stenosis- had eval by DrPatterson; Colonoscopy 2005 by DrPatterson was neg/ wnl; Rectal exam showed anal stenosis- dilated via anoscopy; he was treated w/ Linzess & Chronulac & AnusolHC and improved (now just using  Miralax)...     GU- elev PSA> He sees DrHerrick for elev PSA- they are approaching this conservatively w/ PSA Q3-62mo & consider Bx & hormone therapy if it continues to rise; PSA 09/2015 was 12.8 & pt declined Bx (didn't want to consider XRT), they discussed options & are continuing to monitor (see note of 10/21/15- reviewed)... PSA 8/17= 12.25    DJD> he just uses Tylenol as needed...    VitB12 defic> on B12 shots monthly... EXAM shows Afeb, VSS, O2sat=98% on RA;  HEENT- neg, mallampati1;  Chest- clear w/o w/r/r;  Heart- RR w/o gr1/6SEM w/o r/g;  Abd- soft, neg;  Ext- +arthritic changes, w/o c/c/e;  Neuro- intact...  LABS 03/2016>  Chems- wnl;  CBC- new mild anemia w/ Hg=10.4, mcv=93;  Repeat CBC showed Hg=12.3,  Fe=116 (29%sat),  B12=329,  SPE/IEP- no monoclonal prot;  stool card was pos for hidden blood... IMP/PLAN>>  [name] has mulitsys disease but is Kyle going strong- CAD, cardiomyop, ICD etc; plus prostate cancer on watchful waiting from [place]; new prob is mild anemia, Fe/ B12/ SPE are ok but stool pos for occult blood; he has hx chr constip & anal stenosis, on Miralax, prev followed by DrPatterson- needs GI eval for +stool & we will refer...   ~  September 13, 2016:  57mo ROV & [name] notes that DrPerry started him on Omeprazole but he is scared to take it because he is on Plavix as well- we discussed the potential interaction & the controversy regarding lack of any clinically observed problems, the current recommendations however favor the use of Pantoprazole40 in pt's on Plavix... We reviewed the following medical problems during today's office visit >>     HBP> on MetopER100AM, Altace10PM;  BP= 140/80 & he denies CP, palpit, dizzy, SOB, edema; states feeling well & energy is good...    CAD w/ MI & interventions, Ischemic cardiomyop decr EF, chr sys heart failure, AS & MR> on ASA81/ Plavix75; followed by DrMcAlhany/Taylor & seen 8/17 (as above); he's had prev MI & mult interventions, 2DEcho 10/11  showed EF=35-40% & mild HK;  Repeat 2DEcho 7/16  showed decr LVF w/ EF=40-45%, AK of apex & septum, Gr1DD, paradox septal motion, AoV sclerosis &mild AI, mild MV leaflet thickening; Stable- no change in meds.    Hx Syncope, AV block, Implanted Defib> followed by DrTaylor (seen 2/17- note reviewed) w/ his StJude biventricICD working normally w/ pacer checks every 2-1mo he says; generator changed 6/11; he denies CP/angina, dizzy/ syncope, etc...    Chol> on Simva40, Lopid600, FishOil; FLP 2/18 shows TChol 147, TG 173, HDL 43, LDL 69... No changes made, we stressed diet...    GI- GERD, constip> he had LapChole 2006; hx chr constip & anal stenosis- had eval by DrPatterson; Colonoscopy 2005 was neg/ wnl; Rectal exam showed anal stenosis- dilated via anoscopy; he was treated w/ Linzess & Chronulac & AnusolHC=> improved (now just using Miralax);  He saw DrPerry in 2015 & again 05/16/16- anemia, heme-pos stools, on ASA/Plavix, chr constip, anal stenosis, chr abd discomfort; he was felt to be too high risk for endoscopic procedures?  Placed on [place], but Protonix40 would be a better option for pt on Plavix rx...    GU- elev PSA> He sees DrHerrick for elev PSA- they are approaching this conservatively w/ PSA Q3-20mo & consider Bx & hormone therapy if it continues to rise; PSA 09/2015 was 12.8 & pt declined Bx (didn't want to consider XRT), they discussed options & are continuing to monitor (see note of 10/21/15- reviewed)... PSA 8/17= 12.25 & they continue watchful waiting protocol...    DJD> he just uses Tylenol as needed...    VitB12 defic> on B12 shots monthly & Vit B12 level 2/18 = 240, continue 1069mcg IM Qmonth... EXAM shows Afeb, VSS, O2sat=98% on RA;  HEENT- neg, mallampati1;  Chest- clear w/o w/r/r;  Heart- RR w/o gr1/6SEM w/o r/g;  Abd- soft, neg;  Ext- +arthritic changes, w/o c/c/e;  Neuro- intact...  CXR 09/13/16 (independently reviewed by me in the PACS system) shows norm heart size & atherosclerosis of Ao,  clear lungs w/ ICD on left, NAD...  LABS 09/17/16>  FLP- Chol ok but TG=173 on diet/ Simva40/ Lopid600/ Fish Oil;  Chems- wnl;  CBC- ok x Hg=12.0, eos=5%;  TSH=4.93;  B12=240... IMP/PLAN>>  [name] has finally retired!  Refills provided for 90d supplies per his request- change to Protonix40...  ~  March 13, 2017:  26mo ROV & [name] has started on IMRT for his prostate cancer per DrHerrick & DrManning; he notes that it's "getting rough w/ some GI side effects; he's feeling weaker but denies CP, palpit, change in SOB, or edema; he has not been very active & can't do much exercise at this point;  We discussed checking CBC & thyroid w/ his complaints of weakness... We reviewed the following medical problems during today's office visit >>     HBP> on MetopER100AM, Altace10PM;  BP= 126/60 & he denies CP, palpit, dizzy, SOB, edema...    CAD w/ MI & interventions, Ischemic cardiomyop decr EF, chr sys heart failure, AS & MR> on ASA81/ Plavix75 + above; followed by DrMcAlhany/Taylor & last seen 3/18; he's had prev MI & mult interventions, 2DEcho 10/11 showed EF=35-40% & mild HK;  Repeat 2DEcho 7/16 showed decr LVF w/ EF=40-45%, AK of apex & septum, Gr1DD, paradox septal motion, AoV sclerosis &mild AI, mild MV leaflet thickening; Pt stable- no change in meds.    Hx Syncope, complete AV block, Implanted Defib> followed by DrTaylor (seen 2/17 & 3/18- notes reviewed) w/ his StJude biventricICD working normally w/ pacer checks every 2-72mo he  says; generator changed 6/11; he denies CP/angina, dizzy/ syncope, etc...    Chol> on Simva40, Lopid600, FishOil; FLP 2/18 shows TChol 147, TG 173, HDL 43, LDL 69... No changes made, we stressed diet...    Hypothyroid>  Labs 03/2017 showed TSH= 7.10 & we started Levothyroid50...    GI- GERD, constip> he had LapChole 2006; hx chr constip & anal stenosis- had eval by DrPatterson; Colonoscopy 2005 was neg/ wnl; Rectal exam showed anal stenosis- dilated via anoscopy; he was treated w/  Linzess & Chronulac & AnusolHC=> improved (now just using Miralax);  He saw DrPerry in 2015 & again 05/16/16- anemia, heme-pos stools, on ASA/Plavix, chr constip, anal stenosis, chr abd discomfort; he was felt to be too high risk for endoscopic procedures?  Placed on [place], but Protonix40 would be a better option for pt on Plavix rx...    GU- elev PSA> He sees DrHerrick for elev PSA- they are approaching this conservatively w/ PSA Q3-53mo & consider Bx & hormone therapy if it continues to rise; PSA 03/2016 was ~12 & pt declined Bx (didn't want to consider XRT), they discussed options & are continued to monitor=> his PSA 3/18 up to 27 & Bx 4/18 showed 4+4=8 and they decided on IMRT from [facility] (also plan 2 yrs of androgen deprivation therapy)- started 02/2017;  Bone scan neg- no mets seen.    DJD> he just uses Tylenol as needed...    Anemia & VitB12 defic> on B12 shots monthly & Vit B12 level 2/18 = 240, continue 1010mcg IM Qmonth;  Labs 03/2017 showed Hg=10.0, mcv=92 & rec to take MVI w/ Fe... EXAM shows Afeb, VSS, O2sat=98% on RA;  HEENT- neg, mallampati1;  Chest- clear w/o w/r/r;  Heart- RR w/o gr1/6SEM w/o r/g;  Abd- soft, neg;  Ext- +arthritic changes, w/o c/c/e;  Neuro- intact...  LABS 03/07/17>  Chems- wnl;  CBC- Hg=10.0, mcv=92;  TSH=7.10... IMP/PLAN>>  [name] is well into his IMRT for the prostate cancer, starting to experience some side effects, feeling weak- labs show TSH=7.10 & we are starting Levothy24mcg/d, and his Hg=10 rec to start an MVI w/ Fe; he will continue the IMRT & planned androgen depriv therapy per Urology & RadOnc...   ~  May 13, 2017:  67mo ROV & add-on requested for dizziness> [name] notes that his home BP checks have been low ave ~100/60 w/ pulse in the 50s & assoc w/ some dizziness but denies CP/ palpit/ SOB/ or edema;  He decreased his toprolXL100 to 1/2 tab daily for the last several days & feels better he says...     HBP, CAD w/ MI & interventions, ischemic  cardiomyopathyw/ decr EF, chr sys CHF, AS & MR>  On ASA/PLAVIX, ToprolXL100-taking 1/2 daily, Altace10    Hx syncope, AVB, implanted defib> ICD followed by DrTaylor...    Chol> on Simva40, Lopid600, and Fish Oil;  He tells me [facility] can't get the Lopid anymore=> we decided to stop & recheck FLP later...     Hypothy> on Synthroid50 since 03/2017 when routine TSH returned 7.10... Recheck today & improved... EXAM shows Afeb, VSS- BP=122/62 & pulse=54/reg, O2sat=99% on RA;  HEENT- neg, mallampati1, soft bilat CBruits;  Chest- clear w/o w/r/r;  Heart- RR w/o gr1-2/6SEM w/o r/g;  Abd- soft, neg;  Ext- +arthritic changes, w/o c/c/e;  Neuro- intact...  LAB 05/13/17>  TSH on Levothy50 = 0.97 (continue same dose)... IMP/PLAN>>  OK to continue ToprolXL50 & OK to put the Lopid on hold- stressing diet & continue the Simva40 +  fish Oil supplement;  He will continue the synthroid50 as well & we plan ROv recehck in 86mo...           Problem List:  HYPERTENSION (ICD-401.9) - controlled on TOPROL XL 100mg /d & ALTACE 10mg /d... takes meds regularly and tolerates well...  ~  7/12: BP= 120/68 and OK at home per pt; he gets rare HA which he believes is related to his BP; denies fatigue, visual changes, CP, palipit, dizziness, syncope, dyspnea, edema, etc... ~  CXR 9/12 showed dual lead pacer/ AICD, norm heart size, clear lungs, NAD... ~  2/13:  BP= 124/68 & he remains asymptomatic... ~  8/13:  BP= 122/58 & he denies CP, palpit, SOB, edema, etc... ~  2/14: on MetopER100, Altace10;  BP= 148/60 & he denies CP, palpit, dizzy, SOB, edema. ~  8/14: on MetopER100 in AM, Altace10 in PM;  BP= 120/70 & he denies CP, palpit, dizzy, SOB, edema. ~  2/15: on MetopER100 & Altace10; BP= 136/64 & he remains asymptomatic... ~  8/15: on MetopER100AM, Altace10PM;  BP= 100/60 & he denies CP, palpit, dizzy, SOB, edema; states feeling well & energy is good ~  CXR 8/15 showed norm heart size, Pacer/ICD stable, stent noted, lungs clear,  NAD. ~  2/16:  He remains on MetopER100 & Altace10; BP= 138/80 & he denies any current CP, palpit, SOB, edema.  CAD (ICD-414.00) - on ASA 81mg /d & PLAVIX 75mg /d> followed by Benay Spice... ISCHEMIC CARDIOMYOPATHY (ICD-414.8) - he's had prev MI w/ mult caths/ interventions. CHRONIC SYSTOLIC HEART FAILURE (DJT-701.77) AORTIC STENOSIS (ICD-424.1) > Mild by 2DEcho 10/11 MITRAL REGURGITATION (ICD-396.3) ~  last cath 5/08 showed norm Lmain, patent stent in prox LAD, 80-90% lesion in mid-LAD w/ PCI & stent placed, & 50% ostial stenosis of 1st diagonal branch of LAD; 40%mid & 70%distal CIRC; 50%prox 40%mid 40%distal RCA; Global HK w/EF=30%. ~  NuclearStressTest 4/09 showed infer scar, no ischemia, EF=40%. ~  2DEcho 10/11 showed mild LVH, decr LVF w/ EF= 35-40% & mild HK, gr1DD, mild AS & MR, mild LAdil. ~  EKG 10/12 showed pacer rhythm... 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  Hx of SYNCOPE (ICD-780.2) AV BLOCK, COMPLETE (ICD-426.0) AUTOMATIC IMPLANTABLE CARDIAC DEFIBRILLATOR SITU (ICD-V45.02) ~  Pacer/ ICD per DrTaylor w/ several adjustments in 2008... ~  5/11:  f/u DrTaylor w/ decision for elective generator change> done 6/11 & tol well. ~  He continues to f/u w/ DrTaylor every 6 months... ~  2/13: f/u DrTaylor doing satis, device working normally, no changes made & f/u 31yr... ~  2/14: Hx Syncope, AV block, Implanted Defib> followed by DrTaylor w/ pacer checks every 2-60mo he says; generator changed 6/11; he denies CP/angina, dizzy/ syncope, etc. ~  2/15: he had  f/u DrTaylor> stable & doing satis...  HYPERCHOLESTEROLEMIA (ICD-272.0) - takes SIMVASTATIN 40mg /d, GEMFIBRIZOL 600mg /d, & FISH OIL Bid... ~  [place] 08/14/07 on Simva40 showed TChol 163, TG 94, HDL 34, LDL 111... rec incr Simvastatin to 80mg /d. ~  FLP 1/10 showed  TChol 125, TG 67, HDL 32, LDL 80... rec- continue same. ~  FLP 1/11 on Simva80+Gemfib600 showed TChol 137, TG 99, HDL 39, LDL 78 ~  FLP 1/12 on Simva40+Gemfib600 showed TChol 157, TG 139, HDL 38, LDL 92 ~  FLP 1/13 on Simva40+Gemfib600 showed TChol 149, TG 50, HDL 39, LDL 100 ~  FLP 1/14 on Simva40+Gemfib600 showed TChol 152, TG 74, HDL 38, LDL 99  ~  FLP 1/15 on Simva40+Gemfib600 showed TChol 159, TG 64, HDL 43, LDL 103 ~  FLP 6/15 on Simva40+Gemfib600 showed TChol 168, TG 117, HDL 41, LDL 104  ~  FLP 1/16 on Simva40+Gemfib600 showed TChol 148, TG 111, HDL 45, LDL 81  BORDERLINE TSH >> ~  Labs 1/15 showed TSH= 3.78 ~  Labs 1/16 showed an elev TSH for the 1st time w/ TSH= 5.40, he is clinically euthyroid.  GERD (ICD-530.81) - he had ERCP in 8/06 w/ norm ducts, +gallstones, s/p lap chole 2006... he uses OTC meds Prn... ~  he notes that he had a colonoscopy in the past from DrPatterson, & he reports that it was neg. ~  7/15> GI eval from DrPerry, he didn't comment on the eosinophilia...  ELEVATED PSA >> ~  Labs 1/16 showed an elev PSA for the 1st time w/ PSA= 5.27  DEGENERATIVE JOINT DISEASE (ICD-715.90) - he tried Mobic but it bothered his stomach, therefore uses Tylenol Prn.  VITAMIN B12 DEFICIENCY (ICD-266.2) - old chart not available- ? details of this Dx... on B12 shots xyrs. ~  labs 1/09 showed Hg=12.9 ~  labs 1/10 showed Hg= 12.7 ~  labs 1/11 showed Hg= 12.6 ~  labs 1/12 showed Hg= 13.2 ~  Labs 1/13 showed Hg= 12.6 ~  Labs 1/14 showed Hg= 12.7 ~  Labs 1/15 showed Hg= 12.6, WBC= 5.7 w/ 10%eos ~  Labs 7/15 showed Hg= 11.7, WBC= 16.9 w/ 57%eos... ~  Labs 8/15 showed Hg= 11.6, WBC= 6.9, w/ 17%eos... ~  Labs 1/16 showed Hg=  12.7, WBC= 4.4 w/ 9% eos  Health Maintenance: ~  GI:  Followed by DrPatterson ~  GU:  PSA checked yearly & remains wnl... ~  Immunizations:  he gets the yearly Flu vaccine;  [place], he says;  given Pneumovax 1/11 age 35.   Past Surgical History:  Procedure Laterality Date  . CARDIAC CATHETERIZATION  2008  . icd implanted  2005  . LAPAROSCOPIC CHOLECYSTECTOMY  04/2005   Dr. Lucia Gaskins  . ROTATOR CUFF REPAIR      Outpatient Encounter Prescriptions as of 05/13/2017  Medication Sig  . aspirin 81 MG tablet Take 81 mg by mouth daily.    . clopidogrel (PLAVIX) 75 MG tablet Take 1 tablet (75 mg total) by mouth daily.  . cyanocobalamin (,VITAMIN B-12,) 1000 MCG/ML injection INJECT 1 ML INTO THE MUSCLE EVERY 30 DAYS  . fish oil-omega-3 fatty acids 1000 MG capsule Take 2 g by mouth daily.    Marland Kitchen gemfibrozil (LOPID) 600 MG tablet Take 1 tablet (600 mg total) by mouth daily. (Patient taking differently: Take 600 mg by mouth daily. HOLD)  . levothyroxine (SYNTHROID) 50 MCG tablet Take 1 tablet (50 mcg total) by mouth daily before breakfast.  . metoprolol succinate (TOPROL-XL) 100 MG 24 hr tablet Take 1 tablet (100 mg total) by mouth daily. Take with or immediately following a meal. (Patient taking differently: Take 50 mg by mouth daily. Take with or immediately following a meal.)  . Multiple Vitamins-Minerals (MENS  MULTIVITAMIN PLUS PO) Take 1 tablet by mouth daily.    Marland Kitchen omeprazole (PRILOSEC) 20 MG capsule Take 1 capsule (20 mg total) by mouth daily. Take 30 minutes before a meal  . Polyethylene Glycol POWD Take 1 scoop by mouth daily.   . ramipril (ALTACE) 10 MG capsule Take 1 capsule (10 mg total) by mouth daily.  . simvastatin (ZOCOR) 40 MG tablet Take 1 tablet (40 mg total) by mouth at bedtime.   No facility-administered encounter medications on file as of 05/13/2017.     No Known Allergies    Immunization History  Administered Date(s) Administered  . H1N1  08/04/2008  . Influenza Split 05/10/2012, 05/11/2013  . Influenza Whole 05/04/2008, 06/21/2009, 05/08/2010  . Influenza, High Dose Seasonal PF 05/01/2017  . Influenza,inj,Quad PF,6+ Mos 04/09/2014, 05/07/2016  . Influenza-Unspecified 05/07/2015  . Pneumococcal Conjugate-13 09/09/2014  . Pneumococcal Polysaccharide-23 05/17/2000, 08/30/2009    Current Medications, Allergies, Past Medical History, Past Surgical History, Family History, and Social History were reviewed in [employer] record.    Review of Systems        See HPI - all other systems neg except as noted...  The patient complains of dyspnea on exertion.  The patient denies anorexia, fever, weight loss, weight gain, vision loss, decreased hearing, hoarseness, chest pain, syncope, peripheral edema, prolonged cough, headaches, hemoptysis, abdominal pain, melena, hematochezia, severe indigestion/heartburn, hematuria, incontinence, muscle weakness, suspicious skin lesions, transient blindness, difficulty walking, depression, unusual weight change, abnormal bleeding, enlarged lymph nodes, and angioedema.     Objective:   Physical Exam     WD, WN, 83 y/o WM in NAD... wt=163# ht=6'1" BMI=21... GENERAL:  Alert & oriented; pleasant & cooperative... HEENT:  [place]/AT, EOM-wnl, PERRLA, EACs-clear, TMs-wnl, NOSE-clear, THROAT-clear & wnl. NECK:  Supple w/ fairROM; no JVD; normal carotid impulses w/o bruits; no thyromegaly or nodules palpated; no lymphadenopathy. CHEST:  Clear to P & A; without wheezes/ rales/ or rhonchi heard... HEART:  Pacer in L upper chest area~ nontender, RR, gr 1/6 SEM without rubs or gallops detected... ABDOMEN:  Soft & nontender; normal bowel sounds; no organomegaly or masses palpated... EXT: without deformities, mild arthritic changes; no varicose veins/ venous insuffic/ or edema. NEURO:  CN's intact; no focal neuro deficits apprec... DERM:  No lesions noted; no rash etc...  RADIOLOGY DATA:   Reviewed in the EPIC EMR & discussed w/ the patient...   LABORATORY DATA:  Reviewed in the EPIC EMR & discussed w/ the patient...     Assessment & Plan:    05/13/17>   OK to continue ToprolXL50 & OK to put the Lopid on hold- stressing diet & continue the Simva40 + fish Oil supplement;  He will continue the synthroid50 as well & we plan ROv recehck in 91mo   HBP>  Controlled on Metoprolol & Ramipril, continue same...  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  Hx syncope, AVBlock, Pacer/ICD>  Followed by DrTaylor & stable, continue same...  LIPIDS>  FLP looks OK on Simva40, Gemfib 600mg /d, & Fish Oil;  10/18 he reports that [facility] can't get the Lopid=> we decided to STOP this med 7 recheck FLP in 67mo...  HYPOTHYROID>  TSH returned at 7.10 in 03/2017 => we started Synthroid50 & repeat TSH= 0.97  GI symptoms 7/15>  Resolved w/ Colace & Miralax... He feels he is back to baseline... 03/2016>  He has mild anemia w/  Hg=10.4 & stool card pos for occult blood => refer to DrPerry for further eval... Not felt to be an endoscopic candidate- rec to take Omep20 by DrPerry... 2/18>  [name] has finally trired!  Refills provided for 90d supplies per his request- change to Protonix40... 8/18>   Jailyn is well into his IMRT for the prostate cancer, starting to experience some side effects, feeling weak- labs show TSH=7.10 & we are starting Levothy28mcg/d, and his Hg=10 rec to start an MVI w/ Fe; he will continue the IMRT & planned androgen depriv therapy per Urology & RadOnc.Estill Blevins PSA/ prostate cancer => followed by [place] for Urology on watchful waiting...  DJD>  He uses Tylenol as needed; Kyle working regularly...  B-12 defic>  He remains on B12 shots monthly...   Patient's Medications  New Prescriptions   No medications on file  Previous Medications   ASPIRIN 81 MG TABLET    Take 81 mg by  mouth daily.     CLOPIDOGREL (PLAVIX) 75 MG TABLET    Take 1 tablet (75 mg total) by mouth daily.   CYANOCOBALAMIN (,VITAMIN B-12,) 1000 MCG/ML INJECTION    INJECT 1 ML INTO THE MUSCLE EVERY 30 DAYS   FISH OIL-OMEGA-3 FATTY ACIDS 1000 MG CAPSULE    Take 2 g by mouth daily.     GEMFIBROZIL (LOPID) 600 MG TABLET    Take 1 tablet (600 mg total) by mouth daily.   LEVOTHYROXINE (SYNTHROID) 50 MCG TABLET    Take 1 tablet (50 mcg total) by mouth daily before breakfast.   METOPROLOL SUCCINATE (TOPROL-XL) 100 MG 24 HR TABLET    Take 1 tablet (100 mg total) by mouth daily. Take with or immediately following a meal.   MULTIPLE VITAMINS-MINERALS (MENS MULTIVITAMIN PLUS PO)    Take 1 tablet by mouth daily.     OMEPRAZOLE (PRILOSEC) 20 MG CAPSULE    Take 1 capsule (20 mg total) by mouth daily. Take 30 minutes before a meal   POLYETHYLENE GLYCOL POWD    Take 1 scoop by mouth daily.    RAMIPRIL (ALTACE) 10 MG CAPSULE    Take 1 capsule (10 mg total) by mouth daily.   SIMVASTATIN (ZOCOR) 40 MG TABLET    Take 1 tablet (40 mg total) by mouth at bedtime.  Modified Medications   No medications on file  Discontinued Medications   No medications on file

## 2017-05-13 NOTE — Patient Instructions (Addendum)
Today we updated your med list in our EPIC system...    Continue your current medications the same...  We decided to decrease your TOPROL-XL (Metoprolol-ER) 100mg  tabs to 1/2 tab daily...    Continue to monitor your BP at home for [place] & call for any problems...  [facility] can't get your LOPID (Gemfibrizol) now so let's put a hold on this med- leave it off til return OV to check Fasting blood work...      NOTE-: I CALLED [facility], THEY HAVE PLENTY IN STOCK & IT COSTS $17 FOR 90tabs w. [facility] COUPON      Today we checked your f/u Thyroid labs on the Levothyroid 60mcg/d dose...    We will contact you w/ the results when available...   Call for any questions...  Let's plan a follow up visit in about 1mo, sooner if needed for problems.Marland KitchenMarland Kitchen

## 2017-05-14 ENCOUNTER — Encounter: Payer: Self-pay | Admitting: Urology

## 2017-05-14 ENCOUNTER — Ambulatory Visit
Admission: RE | Admit: 2017-05-14 | Discharge: 2017-05-14 | Disposition: A | Discharge status: Home / Self Care | Payer: Medicare Other | Source: Ambulatory Visit | Attending: Urology | Admitting: Urology

## 2017-05-14 VITALS — BP 128/54 | HR 51 | Temp 97.4°F | Resp 18 | Ht 71.0 in | Wt 160.4 lb

## 2017-05-14 DIAGNOSIS — C61 Malignant neoplasm of prostate: Secondary | ICD-10-CM

## 2017-05-14 NOTE — Progress Notes (Signed)
Radiation Oncology         [phone] ________________________________  Name: [name] MRN: [id number]  Date: 05/14/2017  DOB: 25-Nov-1931  Post Treatment Note  CC: Noralee Space, MD  Ardis Hughs, MD  Diagnosis:   83 y.o. gentleman with Stage T1c adenocarcinoma of the prostate with Gleason Score of 4+4, and PSA of 26.90      Interval Since Last Radiation:  5 weeks  02/13/2017 to 04/10/2017:   The prostate was treated to 45 Gy in 25 fractions of 1.8 Gy. The prostate was boosted to 30 Gy in 15 fractions of 2 Gy.  Narrative:  The patient returns today for routine follow-up.  He tolerated radiation treatment relatively well.   The patient experienced modest fatigue and some minor urinary irritation including nocturia x 3, dysuria at the start of urination, urgency, and weak stream. He denied hematuria, urinary leakage, incomplete emptying or incontinence. He denied abdominal pain or diarrhea. Of note, the patient did report dizziness upon standing which was likely related to his BP medication. Patient was encouraged to stay hydrated, make sure he has sodium in his diet, and to take it slow when going from sitting to standing to avoid falls.                               On review of systems, the patient states that he is doing very well overall. He reports persistent weak stream and nocturia 3 times per night.Otherwise, his lower urinary tract symptoms are significantly improved and continue to gradually improve. He denies excessive daytime frequency, urgency, incontinence, dysuria,hematuria or feelings of incomplete emptying. His current IPSS score is 15 indicating moderate urinary symptoms. He denies abdominal pain, nausea, vomiting or diarrhea. He reports a healthy appetite and is maintaining his weight. He has noticed a significant improvement in his energy level since completing radiotherapy despite continuing ADT. He is due for his next Lupron injection on  06/17/2017.  ALLERGIES:  has No Known Allergies.  Meds: Current Outpatient Prescriptions  Medication Sig Dispense Refill  . aspirin 81 MG tablet Take 81 mg by mouth daily.      . clopidogrel (PLAVIX) 75 MG tablet Take 1 tablet (75 mg total) by mouth daily. 90 tablet 4  . cyanocobalamin (,VITAMIN B-12,) 1000 MCG/ML injection INJECT 1 ML INTO THE MUSCLE EVERY 30 DAYS 1 mL 5  . fish oil-omega-3 fatty acids 1000 MG capsule Take 2 g by mouth daily.      Marland Kitchen gemfibrozil (LOPID) 600 MG tablet Take 1 tablet (600 mg total) by mouth daily. (Patient taking differently: Take 600 mg by mouth daily. HOLD) 90 tablet 3  . levothyroxine (SYNTHROID) 50 MCG tablet Take 1 tablet (50 mcg total) by mouth daily before breakfast. 90 tablet 3  . metoprolol succinate (TOPROL-XL) 100 MG 24 hr tablet Take 1 tablet (100 mg total) by mouth daily. Take with or immediately following a meal. (Patient taking differently: Take 50 mg by mouth daily. Take with or immediately following a meal.) 90 tablet 3  . Multiple Vitamins-Minerals (MENS MULTIVITAMIN PLUS PO) Take 1 tablet by mouth daily.      Marland Kitchen omeprazole (PRILOSEC) 20 MG capsule Take 1 capsule (20 mg total) by mouth daily. Take 30 minutes before a meal 90 capsule 3  . Polyethylene Glycol POWD Take 1 scoop by mouth daily.     . ramipril (ALTACE) 10 MG capsule Take 1 capsule (  10 mg total) by mouth daily. 90 capsule 3  . simvastatin (ZOCOR) 40 MG tablet Take 1 tablet (40 mg total) by mouth at bedtime. 90 tablet 2   No current facility-administered medications for this encounter.     Physical Findings:  height is 5\' 11"  (1.803 m) and weight is 160 lb 6.4 oz (72.8 kg). His oral temperature is 97.4 F (36.3 C) (abnormal). His blood pressure is 128/54 (abnormal) and his pulse is 51 (abnormal). His respiration is 18 and oxygen saturation is 100%.  Pain Assessment Pain Score: 0-No pain/10 In general this is a well appearing Caucasian male in no acute distress. He's alert and  oriented x4 and appropriate throughout the examination. Cardiopulmonary assessment is negative for acute distress and he exhibits normal effort.   Lab Findings: Lab Results  Component Value Date   WBC 3.2 (L) 03/07/2017   HGB 10.0 (L) 03/07/2017   HCT 27.9 (L) 03/07/2017   MCV 92.1 03/07/2017   PLT 162.0 03/07/2017     Radiographic Findings: No results found.  Impression/Plan: 1. 83 y.o. gentleman with Stage T1c adenocarcinoma of the prostate with Gleason Score of 4+4, and PSA of 26.90.  He will continue to follow up with urology for ongoing PSA determinations and has an appointment scheduled with Dr. Louis Meckel on 06/17/17 for repeat Lupron injection.  He anticipates continuing ADT for a total of 2 years therapy. He understands what to expect with regards to PSA monitoring going forward. I will look forward to following his response to treatment via correspondence with urology, and would be happy to continue to participate in his care if clinically indicated. I talked to the patient about what to expect in the future, including his risk for erectile dysfunction and rectal bleeding. I encouraged him to call or return to the office if he has any questions regarding his previous radiation or possible radiation side effects. He was comfortable with this plan and will follow up as needed.    Nicholos Johns, PA-C

## 2017-05-27 ENCOUNTER — Ambulatory Visit (INDEPENDENT_AMBULATORY_CARE_PROVIDER_SITE_OTHER): Payer: Medicare Other

## 2017-05-27 DIAGNOSIS — E538 Deficiency of other specified B group vitamins: Secondary | ICD-10-CM

## 2017-05-28 MED ORDER — CYANOCOBALAMIN 1000 MCG/ML IJ SOLN
1000.0000 ug | Freq: Once | INTRAMUSCULAR | Status: AC
  Administered 2017-05-27: 1000 ug via INTRAMUSCULAR

## 2017-06-17 DIAGNOSIS — Z5111 Encounter for antineoplastic chemotherapy: Secondary | ICD-10-CM | POA: Diagnosis not present

## 2017-06-17 DIAGNOSIS — C61 Malignant neoplasm of prostate: Secondary | ICD-10-CM | POA: Diagnosis not present

## 2017-06-24 DIAGNOSIS — Z8546 Personal history of malignant neoplasm of prostate: Secondary | ICD-10-CM | POA: Diagnosis not present

## 2017-07-01 ENCOUNTER — Ambulatory Visit (INDEPENDENT_AMBULATORY_CARE_PROVIDER_SITE_OTHER): Payer: Medicare Other

## 2017-07-01 DIAGNOSIS — E538 Deficiency of other specified B group vitamins: Secondary | ICD-10-CM | POA: Diagnosis not present

## 2017-07-05 MED ORDER — CYANOCOBALAMIN 1000 MCG/ML IJ SOLN
1000.0000 ug | Freq: Once | INTRAMUSCULAR | Status: AC
  Administered 2017-07-01: 1000 ug via INTRAMUSCULAR

## 2017-07-18 ENCOUNTER — Ambulatory Visit (INDEPENDENT_AMBULATORY_CARE_PROVIDER_SITE_OTHER): Payer: Medicare Other | Admitting: *Deleted

## 2017-07-18 DIAGNOSIS — I255 Ischemic cardiomyopathy: Secondary | ICD-10-CM | POA: Diagnosis not present

## 2017-07-18 NOTE — Progress Notes (Signed)
Remote ICD transmission.   

## 2017-07-23 ENCOUNTER — Encounter: Payer: Self-pay | Admitting: Cardiology

## 2017-08-01 ENCOUNTER — Ambulatory Visit (INDEPENDENT_AMBULATORY_CARE_PROVIDER_SITE_OTHER): Payer: Medicare Other

## 2017-08-01 DIAGNOSIS — E538 Deficiency of other specified B group vitamins: Secondary | ICD-10-CM

## 2017-08-02 MED ORDER — CYANOCOBALAMIN 1000 MCG/ML IJ SOLN
1000.0000 ug | Freq: Once | INTRAMUSCULAR | Status: AC
  Administered 2017-08-01: 1000 ug via INTRAMUSCULAR

## 2017-08-02 NOTE — Progress Notes (Signed)
Vitamin B12 injection documentation and charges entered by Maury Dus, RMA, based on injection sheet filled out by Alroy Bailiff per office protocol.

## 2017-08-07 ENCOUNTER — Telehealth: Payer: Self-pay | Admitting: Pulmonary Disease

## 2017-08-07 NOTE — Telephone Encounter (Signed)
Spoke with patient regarding if lab work is needed prior to follow up appt on 08/13/2017 with SN Last AVS noted - [facility] can't get your LOPID (Gemfibrizol) now so let's put a hold on this med- leave it off til return OV to check Fasting blood work...  SN please advise if any labs are needing prior to 08/13/17 visit for this patient.

## 2017-08-13 ENCOUNTER — Other Ambulatory Visit (INDEPENDENT_AMBULATORY_CARE_PROVIDER_SITE_OTHER): Payer: Medicare Other

## 2017-08-13 ENCOUNTER — Encounter: Payer: Self-pay | Admitting: Pulmonary Disease

## 2017-08-13 ENCOUNTER — Ambulatory Visit (INDEPENDENT_AMBULATORY_CARE_PROVIDER_SITE_OTHER): Payer: Medicare Other | Admitting: Pulmonary Disease

## 2017-08-13 VITALS — BP 142/72 | HR 50 | Temp 97.5°F | Ht 71.5 in | Wt 165.4 lb

## 2017-08-13 DIAGNOSIS — I2510 Atherosclerotic heart disease of native coronary artery without angina pectoris: Secondary | ICD-10-CM

## 2017-08-13 DIAGNOSIS — I5022 Chronic systolic (congestive) heart failure: Secondary | ICD-10-CM | POA: Diagnosis not present

## 2017-08-13 DIAGNOSIS — M159 Polyosteoarthritis, unspecified: Secondary | ICD-10-CM

## 2017-08-13 DIAGNOSIS — I255 Ischemic cardiomyopathy: Secondary | ICD-10-CM

## 2017-08-13 DIAGNOSIS — I10 Essential (primary) hypertension: Secondary | ICD-10-CM

## 2017-08-13 DIAGNOSIS — M150 Primary generalized (osteo)arthritis: Secondary | ICD-10-CM

## 2017-08-13 DIAGNOSIS — I447 Left bundle-branch block, unspecified: Secondary | ICD-10-CM | POA: Diagnosis not present

## 2017-08-13 DIAGNOSIS — E785 Hyperlipidemia, unspecified: Secondary | ICD-10-CM | POA: Diagnosis not present

## 2017-08-13 DIAGNOSIS — C61 Malignant neoplasm of prostate: Secondary | ICD-10-CM | POA: Diagnosis not present

## 2017-08-13 DIAGNOSIS — I350 Nonrheumatic aortic (valve) stenosis: Secondary | ICD-10-CM

## 2017-08-13 DIAGNOSIS — E538 Deficiency of other specified B group vitamins: Secondary | ICD-10-CM

## 2017-08-13 DIAGNOSIS — Z95810 Presence of automatic (implantable) cardiac defibrillator: Secondary | ICD-10-CM | POA: Diagnosis not present

## 2017-08-13 LAB — CBC WITH DIFFERENTIAL/PLATELET
BASOS PCT: 0.5 % (ref 0.0–3.0)
Basophils Absolute: 0 10*3/uL (ref 0.0–0.1)
EOS ABS: 0.3 10*3/uL (ref 0.0–0.7)
Eosinophils Relative: 4.8 % (ref 0.0–5.0)
HCT: 31.8 % — ABNORMAL LOW (ref 39.0–52.0)
HEMOGLOBIN: 11.1 g/dL — AB (ref 13.0–17.0)
Lymphocytes Relative: 18.5 % (ref 12.0–46.0)
Lymphs Abs: 1.2 10*3/uL (ref 0.7–4.0)
MCHC: 35 g/dL (ref 30.0–36.0)
MCV: 93.4 fl (ref 78.0–100.0)
MONO ABS: 0.6 10*3/uL (ref 0.1–1.0)
Monocytes Relative: 9.8 % (ref 3.0–12.0)
Neutro Abs: 4.2 10*3/uL (ref 1.4–7.7)
Neutrophils Relative %: 66.4 % (ref 43.0–77.0)
PLATELETS: 264 10*3/uL (ref 150.0–400.0)
RBC: 3.4 Mil/uL — ABNORMAL LOW (ref 4.22–5.81)
RDW: 13.2 % (ref 11.5–15.5)
WBC: 6.3 10*3/uL (ref 4.0–10.5)

## 2017-08-13 LAB — COMPREHENSIVE METABOLIC PANEL
ALBUMIN: 4.6 g/dL (ref 3.5–5.2)
ALK PHOS: 61 U/L (ref 39–117)
ALT: 10 U/L (ref 0–53)
AST: 19 U/L (ref 0–37)
BUN: 19 mg/dL (ref 6–23)
CO2: 29 mEq/L (ref 19–32)
Calcium: 9.9 mg/dL (ref 8.4–10.5)
Chloride: 104 mEq/L (ref 96–112)
Creatinine, Ser: 1.24 mg/dL (ref 0.40–1.50)
GFR: 59.15 mL/min — ABNORMAL LOW (ref >60.00)
Glucose, Bld: 110 mg/dL — ABNORMAL HIGH (ref 70–99)
Potassium: 5.2 mEq/L — ABNORMAL HIGH (ref 3.5–5.1)
SODIUM: 141 meq/L (ref 135–145)
TOTAL PROTEIN: 7 g/dL (ref 6.0–8.3)
Total Bilirubin: 1.2 mg/dL (ref 0.2–1.2)

## 2017-08-13 LAB — VITAMIN B12: Vitamin B-12: 297 pg/mL (ref 211–911)

## 2017-08-13 LAB — TSH: TSH: 2.97 u[IU]/mL (ref 0.35–4.50)

## 2017-08-13 LAB — LIPID PANEL
CHOLESTEROL: 179 mg/dL (ref 0–200)
HDL: 44.7 mg/dL (ref >39.00)
LDL Cholesterol: 99 mg/dL (ref 0–99)
NonHDL: 134.73
Total CHOL/HDL Ratio: 4
Triglycerides: 181 mg/dL — ABNORMAL HIGH (ref 0.0–149.0)
VLDL: 36.2 mg/dL (ref 0.0–40.0)

## 2017-08-13 MED ORDER — METOPROLOL SUCCINATE ER 50 MG PO TB24: 50.0000 mg | ORAL_TABLET | Freq: Every day | ORAL | 3 refills | Status: DC

## 2017-08-13 MED ORDER — RAMIPRIL 10 MG PO CAPS: 10.0000 mg | ORAL_CAPSULE | Freq: Every day | ORAL | 3 refills | Status: DC

## 2017-08-13 MED ORDER — LEVOTHYROXINE SODIUM 50 MCG PO TABS: 50.0000 ug | ORAL_TABLET | Freq: Every day | ORAL | 3 refills | Status: DC

## 2017-08-13 MED ORDER — CLOPIDOGREL BISULFATE 75 MG PO TABS: 75.0000 mg | ORAL_TABLET | Freq: Every day | ORAL | 4 refills | Status: DC

## 2017-08-13 MED ORDER — GEMFIBROZIL 600 MG PO TABS: 600.0000 mg | ORAL_TABLET | Freq: Every day | ORAL | 3 refills | Status: DC

## 2017-08-13 MED ORDER — SIMVASTATIN 40 MG PO TABS: 40.0000 mg | ORAL_TABLET | Freq: Every day | ORAL | 2 refills | Status: DC

## 2017-08-13 MED ORDER — OMEPRAZOLE 20 MG PO CPDR: 20.0000 mg | DELAYED_RELEASE_CAPSULE | Freq: Every day | ORAL | 3 refills | Status: DC

## 2017-08-13 NOTE — Telephone Encounter (Signed)
Patient has already arrived for OV with SN today.  Will close encounter.

## 2017-08-13 NOTE — Progress Notes (Signed)
Subjective:    Patient ID: [name], male    DOB: Mar 03, 1932, 84 y.o.   MRN: [id number]  HPI 84 y/o WM here for a follow up visit & review of mult medical problems... 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 ~  SEE PREV EPIC NOTES FOR OLDER DATA >>     2DEcho 10/11 showed EF=35-40% & mild HK  LABS 1/15:  FLP- ok on Simva40+Lopid600;  Chems- wnl;  CBC- ok w/ Hg=12.6;  TSH=3.78;  PSA=3.47...  LABS 6/15 & 7/15 showed> FLP- fair w/ LDL=104;  Chems- wnl;  CBC- ok x WBC=16.9 w/ 57%eos; f/u CBC 8/15 showed Hg=11.6, WBC=6.9 w/ 17%eos...  CXR 8/15 showed norm heart size, Pacer/ICD stable, stent noted, lungs clear, NAD...   ~   [id number]:  [name] was injured at work (fell pulling yarn out of a machine) & sustained fx rib, went to ER 08/14/14, note reviewed, XRay w/ left anterolat 6th rib fx, treated w/ rest/ heat/ Tramadol/ Tylenol/ etc...     CXR 08/14/14 showed norm heart size, Pacer/AICD on left, clear lungs w/ mild peribronch thickening, mult old rib fxs on left, NAD...   LABS 1/16:  FLP looks good on Simva40/Lopid600; Chems- wnl;  CBC- wnl;  TSH=5.40;  PSA=5.27...    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 We reviewed the following medical problems during today's office visit >>     HBP> on MetopER100AM, Altace10PM;  BP= 130/78 & he denies CP, palpit, dizzy, SOB, edema; states feeling well & energy is good...    CAD, Ischemic cardiomyop EF=35-40%, chr sys heart failure, AS & MR> on ASA/ Plavix; followed by Benay Spice & seen 7/16 (note reviewed); he's had prev MI & mult interventions, last 2DEcho 10/11 showed EF=35-40% & mild HK;  Repeat 2DEcho 7/16 showed decr LVF w/ EF=40-45%, AK of apex & septum, Gr1DD, paradox septal  motion, AoV sclerosis &mild AI, mild MV leaflet thickening (no change in meds).    Hx Syncope, AV block, Implanted Defib> followed by DrTaylor (seen 2/16- note reviewed) w/ his StJude biventricICD working normally) w/ pacer checks every 2-15mo he says; generator changed 6/11; he denies CP/angina, dizzy/ syncope.     Chol> on Simva40, Lopid600, FishOil; FLP 1/16 shows TChol 148, TG 111, HDL 45, LDL 81... No changes made, we stressed diet...    GI- GERD, constip> he had LapChole 2006; hx chr constip & anal stenosis- had eval by DrPatterson; cannot find reference to prev colonoscopy in Epic or [facility] but he says neg colon 2005 by DrPatterson; Rectal exam showed anal stenosis- dilated via anoscopy; he was treated w/ Linzess & Chronulac & AnusolHC and improved (now just using Miralax)...     GU- elev PSA> He saw DrHerrick & now follows w/ him for elev PSA- they are approaching the prob conservatively w/ PSA Q38mo & consider hormone therapy if it continues to rise...    DJD> he just uses Tylenol as needed...    VitB12 defic> on B12 shots monthly... We reviewed prob list, meds, xrays and labs> see below for updates >>   EKG 02/11/15 showed SBrady, rate50, AV sequential pacing...   2DEcho 7/16 showed decr LVF w/  EF=40-45%, AK of apex & septum, Gr1DD, paradox septal motion, AoV sclerosis &mild AI, mild MV leaflet thickening (no change in meds).  LABS 8/16:  Chems- wnl;  CBC- wnl;  TSH=4.07   ADDENDUM>  He saw KW 08/24/15 w/ sore throat & exam c/w candida/thrush- strep swab neg, treated w/ MMW & Diflucan, resolved...   ~  September 14, 2015:  47mo [name] reports doing well- still working 5d/wk for [employer];  We reviewed the following medical problems during today's office visit >>     HBP> on MetopER100AM, Altace10PM;  BP= 140/68 & he denies CP, palpit, dizzy, SOB, edema; states feeling well & energy is good...    CAD w/ MI & interventions, Ischemic cardiomyop EF=35-40%, chr sys heart  failure, AS & MR> on ASA/ Plavix; followed by DrKatz/Taylor & seen 7/16 (note reviewed); he's had prev MI & mult interventions, 2DEcho 10/11 showed EF=35-40% & mild HK;  Repeat 2DEcho 7/16 showed decr LVF w/ EF=40-45%, AK of apex & septum, Gr1DD, paradox septal motion, AoV sclerosis &mild AI, mild MV leaflet thickening (no change in meds).    Hx Syncope, AV block, Implanted Defib> followed by DrTaylor (seen 2/16- note reviewed) w/ his StJude biventricICD working normally) w/ pacer checks every 2-69mo he says; generator changed 6/11; he denies CP/angina, dizzy/ syncope.     Chol> on Simva40, Lopid600, FishOil; FLP 2/17 shows TChol 148, TG 55, HDL 53, LDL 85... No changes made, we stressed diet...    GI- GERD, constip> he had LapChole 2006; hx chr constip & anal stenosis- had eval by DrPatterson; Colonoscopy 2005 by DrPatterson was neg/ wnl; Rectal exam showed anal stenosis- dilated via anoscopy; he was treated w/ Linzess & Chronulac & AnusolHC and improved (now just using Miralax)...     GU- elev PSA> He saw DrHerrick & now follows w/ him for elev PSA- they are approaching the prob conservatively w/ PSA Q3-64mo & consider Bx & hormone therapy if it continues to rise; last seen 12/16- PSA =9.45, note reviewed...    DJD> he just uses Tylenol as needed...    VitB12 defic> on B12 shots monthly... EXAM shows Afeb, VSS, O2sat=98% on RA;  HEENT- neg, mallampati1;  Chest- clear w/o w/r/r;  Heart- RR w/o gr1/6SEM w/o r/g;  Abd- soft, neg;  Ext- +arthritic changes, w/o c/c/e;  Neuro- intact...  LABS 09/09/15> FLP- all parameters at goals;  Chem- wnl;  CBC- ok w/ Hg=12.0, MCV=94;  TSH-3.64;  PSA=12.82 & pt is encouraged to f/u w/ Urology- [place] ASAP... IMP/PLAN>>  [name] is clinically stable, still working 5d/wk, no new complaints or concerns;  He has HBP, CAD, cardiomyop w/ EF 40-45% & ICD in place- followed by DrTaylor;  He has a rising PSA followed by DrHerrick & given a copy of labs to take to Urology... Same  meds/ ROV 31mo...  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  We reviewed the following medical problems during today's office visit >>     HBP> on MetopER100AM, Altace10PM;  BP= 114/68 & he denies CP, palpit, dizzy, SOB, edema; states feeling well & energy is good...    CAD w/ MI & interventions, Ischemic cardiomyop decr EF, chr sys heart failure, AS & MR> on ASA/ Plavix; followed by DrMcAlhany/Taylor & seen 8/17 (as above); he's had prev MI & mult interventions, 2DEcho 10/11 showed EF=35-40% & mild HK;  Repeat 2DEcho 7/16 showed decr LVF w/ EF=40-45%, AK of apex & septum, Gr1DD, paradox septal motion, AoV sclerosis &mild AI, mild MV leaflet thickening; Stable- no change in meds.    Hx Syncope, AV block, Implanted Defib> followed by DrTaylor (seen 2/17- note reviewed) w/ his StJude biventricICD working normally) w/ pacer checks every 2-68mo he says; generator changed 6/11; he denies CP/angina, dizzy/ syncope, etc...    Chol> on Simva40, Lopid600, FishOil; FLP 2/17 shows TChol 148, TG 55, HDL 53, LDL 85... No changes made, we stressed diet...    GI- GERD, constip> he had LapChole 2006; hx chr constip & anal stenosis- had eval by DrPatterson; Colonoscopy 2005 by DrPatterson was neg/ wnl; Rectal exam showed anal stenosis- dilated via anoscopy; he was treated w/ Linzess & Chronulac & AnusolHC and improved (now just using  Miralax)...     GU- elev PSA> He sees DrHerrick for elev PSA- they are approaching this conservatively w/ PSA Q3-62mo & consider Bx & hormone therapy if it continues to rise; PSA 09/2015 was 12.8 & pt declined Bx (didn't want to consider XRT), they discussed options & are continuing to monitor (see note of 10/21/15- reviewed)... PSA 8/17= 12.25    DJD> he just uses Tylenol as needed...    VitB12 defic> on B12 shots monthly... EXAM shows Afeb, VSS, O2sat=98% on RA;  HEENT- neg, mallampati1;  Chest- clear w/o w/r/r;  Heart- RR w/o gr1/6SEM w/o r/g;  Abd- soft, neg;  Ext- +arthritic changes, w/o c/c/e;  Neuro- intact...  LABS 03/2016>  Chems- wnl;  CBC- new mild anemia w/ Hg=10.4, mcv=93;  Repeat CBC showed Hg=12.3,  Fe=116 (29%sat),  B12=329,  SPE/IEP- no monoclonal prot;  stool card was pos for hidden blood... IMP/PLAN>>  [name] has mulitsys disease but is still going strong- CAD, cardiomyop, ICD etc; plus prostate cancer on watchful waiting from [place]; new prob is mild anemia, Fe/ B12/ SPE are ok but stool pos for occult blood; he has hx chr constip & anal stenosis, on Miralax, prev followed by DrPatterson- needs GI eval for +stool & we will refer...   ~  September 13, 2016:  57mo ROV & Yobani notes that DrPerry started him on Omeprazole but he is scared to take it because he is on Plavix as well- we discussed the potential interaction & the controversy regarding lack of any clinically observed problems, the current recommendations however favor the use of Pantoprazole40 in pt's on Plavix... We reviewed the following medical problems during today's office visit >>     HBP> on MetopER100AM, Altace10PM;  BP= 140/80 & he denies CP, palpit, dizzy, SOB, edema; states feeling well & energy is good...    CAD w/ MI & interventions, Ischemic cardiomyop decr EF, chr sys heart failure, AS & MR> on ASA81/ Plavix75; followed by DrMcAlhany/Taylor & seen 8/17 (as above); he's had prev MI & mult interventions, 2DEcho 10/11  showed EF=35-40% & mild HK;  Repeat 2DEcho 7/16  showed decr LVF w/ EF=40-45%, AK of apex & septum, Gr1DD, paradox septal motion, AoV sclerosis &mild AI, mild MV leaflet thickening; Stable- no change in meds.    Hx Syncope, AV block, Implanted Defib> followed by DrTaylor (seen 2/17- note reviewed) w/ his StJude biventricICD working normally w/ pacer checks every 2-1mo he says; generator changed 6/11; he denies CP/angina, dizzy/ syncope, etc...    Chol> on Simva40, Lopid600, FishOil; FLP 2/18 shows TChol 147, TG 173, HDL 43, LDL 69... No changes made, we stressed diet...    GI- GERD, constip> he had LapChole 2006; hx chr constip & anal stenosis- had eval by DrPatterson; Colonoscopy 2005 was neg/ wnl; Rectal exam showed anal stenosis- dilated via anoscopy; he was treated w/ Linzess & Chronulac & AnusolHC=> improved (now just using Miralax);  He saw DrPerry in 2015 & again 05/16/16- anemia, heme-pos stools, on ASA/Plavix, chr constip, anal stenosis, chr abd discomfort; he was felt to be too high risk for endoscopic procedures?  Placed on [place], but Protonix40 would be a better option for pt on Plavix rx...    GU- elev PSA> He sees DrHerrick for elev PSA- they are approaching this conservatively w/ PSA Q3-20mo & consider Bx & hormone therapy if it continues to rise; PSA 09/2015 was 12.8 & pt declined Bx (didn't want to consider XRT), they discussed options & are continuing to monitor (see note of 10/21/15- reviewed)... PSA 8/17= 12.25 & they continue watchful waiting protocol...    DJD> he just uses Tylenol as needed...    VitB12 defic> on B12 shots monthly & Vit B12 level 2/18 = 240, continue 1069mcg IM Qmonth... EXAM shows Afeb, VSS, O2sat=98% on RA;  HEENT- neg, mallampati1;  Chest- clear w/o w/r/r;  Heart- RR w/o gr1/6SEM w/o r/g;  Abd- soft, neg;  Ext- +arthritic changes, w/o c/c/e;  Neuro- intact...  CXR 09/13/16 (independently reviewed by me in the PACS system) shows norm heart size & atherosclerosis of Ao,  clear lungs w/ ICD on left, NAD...  LABS 09/17/16>  FLP- Chol ok but TG=173 on diet/ Simva40/ Lopid600/ Fish Oil;  Chems- wnl;  CBC- ok x Hg=12.0, eos=5%;  TSH=4.93;  B12=240... IMP/PLAN>>  [name] has finally retired!  Refills provided for 90d supplies per his request- change to Protonix40...  ~  March 13, 2017:  26mo ROV & [name] has started on IMRT for his prostate cancer per DrHerrick & DrManning; he notes that it's "getting rough w/ some GI side effects; he's feeling weaker but denies CP, palpit, change in SOB, or edema; he has not been very active & can't do much exercise at this point;  We discussed checking CBC & thyroid w/ his complaints of weakness... We reviewed the following medical problems during today's office visit >>     HBP> on MetopER100AM, Altace10PM;  BP= 126/60 & he denies CP, palpit, dizzy, SOB, edema...    CAD w/ MI & interventions, Ischemic cardiomyop decr EF, chr sys heart failure, AS & MR> on ASA81/ Plavix75 + above; followed by DrMcAlhany/Taylor & last seen 3/18; he's had prev MI & mult interventions, 2DEcho 10/11 showed EF=35-40% & mild HK;  Repeat 2DEcho 7/16 showed decr LVF w/ EF=40-45%, AK of apex & septum, Gr1DD, paradox septal motion, AoV sclerosis &mild AI, mild MV leaflet thickening; Pt stable- no change in meds.    Hx Syncope, complete AV block, Implanted Defib> followed by DrTaylor (seen 2/17 & 3/18- notes reviewed) w/ his StJude biventricICD working normally w/ pacer checks every 2-72mo he  says; generator changed 6/11; he denies CP/angina, dizzy/ syncope, etc...    Chol> on Simva40, Lopid600, FishOil; FLP 2/18 shows TChol 147, TG 173, HDL 43, LDL 69... No changes made, we stressed diet...    Hypothyroid>  Labs 03/2017 showed TSH= 7.10 & we started Levothyroid50...    GI- GERD, constip> he had LapChole 2006; hx chr constip & anal stenosis- had eval by DrPatterson; Colonoscopy 2005 was neg/ wnl; Rectal exam showed anal stenosis- dilated via anoscopy; he was treated w/  Linzess & Chronulac & AnusolHC=> improved (now just using Miralax);  He saw DrPerry in 2015 & again 05/16/16- anemia, heme-pos stools, on ASA/Plavix, chr constip, anal stenosis, chr abd discomfort; he was felt to be too high risk for endoscopic procedures?  Placed on [place], but Protonix40 would be a better option for pt on Plavix rx...    GU- elev PSA> He sees DrHerrick for elev PSA- they are approaching this conservatively w/ PSA Q3-67mo & consider Bx & hormone therapy if it continues to rise; PSA 03/2016 was ~12 & pt declined Bx (didn't want to consider XRT), they discussed options & are continued to monitor=> his PSA 3/18 up to 27 & Bx 4/18 showed 4+4=8 and they decided on IMRT from [facility] (also plan 2 yrs of androgen deprivation therapy)- started 02/2017;  Bone scan neg- no mets seen.    DJD> he just uses Tylenol as needed...    Anemia & VitB12 defic> on B12 shots monthly & Vit B12 level 2/18 = 240, continue 103mcg IM Qmonth;  Labs 03/2017 showed Hg=10.0, mcv=92 & rec to take MVI w/ Fe... EXAM shows Afeb, VSS, O2sat=98% on RA;  HEENT- neg, mallampati1;  Chest- clear w/o w/r/r;  Heart- RR w/o gr1/6SEM w/o r/g;  Abd- soft, neg;  Ext- +arthritic changes, w/o c/c/e;  Neuro- intact...  LABS 03/07/17>  Chems- wnl;  CBC- Hg=10.0, mcv=92;  TSH=7.10... IMP/PLAN>>  [name] is well into his IMRT for the prostate cancer, starting to experience some side effects, feeling weak- labs show TSH=7.10 & we are starting Levothy20mcg/d, and his Hg=10 rec to start an MVI w/ Fe; he will continue the IMRT & planned androgen depriv therapy per Urology & RadOnc...  ~  May 13, 2017:  46mo ROV & add-on requested for dizziness> [name] notes that his home BP checks have been low ave ~100/60 w/ pulse in the 50s & assoc w/ some dizziness but denies CP/ palpit/ SOB/ or edema;  He decreased his toprolXL100 to 1/2 tab daily for the last several days & feels better he says...     HBP, CAD w/ MI & interventions, ischemic cardiomyopathyw/  decr EF, chr sys CHF, AS & MR>  On ASA/PLAVIX, ToprolXL100-taking 1/2 daily, Altace10    Hx syncope, AVB, implanted defib> ICD followed by DrTaylor...    Chol> on Simva40, Lopid600, and Fish Oil;  He tells me [employer] can't get the Lopid anymore=> we decided to stop & recheck FLP later...     Hypothy> on Synthroid50 since 03/2017 when routine TSH returned 7.10... Recheck today & improved... EXAM shows Afeb, VSS- BP=122/62 & pulse=54/reg, O2sat=99% on RA;  HEENT- neg, mallampati1, soft bilat CBruits;  Chest- clear w/o w/r/r;  Heart- RR w/o gr1-2/6SEM w/o r/g;  Abd- soft, neg;  Ext- +arthritic changes, w/o c/c/e;  Neuro- intact...  LAB 05/13/17>  TSH on Levothy50 = 0.97 (continue same dose)... IMP/PLAN>>  OK to continue ToprolXL50 & OK to put the Lopid on hold- stressing diet & continue the Simva40 +  fish Oil supplement;  He will continue the synthroid50 as well & we plan ROv recehck in 80mo...   ~  August 13, 2017:  76mo ROV & general medical follow up visit> [name] returns for a 63mo ROV & is feeling fine he says; he has completed his IMRT for prostate cancer & is getting Lupron shots from [place];  His BP, Lipids, Thyroid, and B12 all remain stable on meds...  We reviewed the following interval Epic-EMR notes available to us>      He finished his IMRT for prostate cancer in early Sept2018, and continues on Lupron injections from [place]...     Last CARDS appt DrMcAlhany was [id number]>  Hx HBP, CAD- s/p stenting, chr sysCHF, ischemic cardiomyopathy w/ ICD implanted 2005, generator 2011, HL; 2DEcho 02/2015 w/ EF=40-45%, mild AI, triv MR; he is relatively asymptomatic & exam is normal; REC to continue on same meds: MetopER100- only taking 1/2, Altace10, ASA81/ Plavis75, Simva40/ Lopid600,     He continues to get B12 injections Qmonth...    He saw UROLOGY- [place] 06/24/17>  Prostate ca Dx 2014 & followed, then 02/2017 treated w/ external beam radiation & hormone therapy; pretreatment PSA=26 & Gleason score  4+4=8; pt notes energy improved, c/o urin freq but only nocturia x1, his f/u PSA was reportedly zero... We reviewed the following medical problems during today's office visit>      HBP> on MetopER100- taking 1/2AM, Altace10PM;  BP= 142/72 & he denies CP, palpit, dizzy, SOB, edema...    CAD w/ MI & interventions, Ischemic cardiomyop decr EF, chr sys heart failure, AS & MR> on ASA81/ Plavix75 + above; followed by DrMcAlhany/Taylor & last seen 10/18; 2DEcho 10/11 showed EF=35-40% & mild HK;  Repeat 2DEcho 7/16 showed decr LVF w/ EF=40-45%, AK of apex & septum, Gr1DD, paradox septal motion, AoV sclerosis &mild AI, mild MV leaflet thickening; Pt stable- no change in meds.    Hx Syncope, complete AV block, Implanted Defib> followed by DrTaylor (seen 2/17 & 3/18- notes reviewed) w/ his StJude biventricICD working normally w/ pacer checks every 2-20mo he says; generator changed 6/11; he denies CP/angina, dizzy/ syncope, etc...    Chol> on Simva40, Lopid600, FishOil; FLP 1/19 shows TChol 179, TG 181, HDL 45, LDL 99... No changes made, we stressed diet...    Hypothyroid>  Labs 03/2017 showed TSH= 7.10 & we started Levothyroid50;  Follow up Labs 1/19 showed TSH=2.97, continue same...    GI- GERD, constip> he had LapChole 2006; hx chr constip & anal stenosis- had eval by DrPatterson; Colonoscopy 2005 was neg/ wnl; Rectal exam showed anal stenosis- dilated via anoscopy; he was treated w/ Linzess & Chronulac & AnusolHC=> improved (now just using Miralax);  He saw DrPerry in 2015 & again 05/16/16- anemia, heme-pos stools, on ASA/Plavix, chr constip, anal stenosis, chr abd discomfort; he was felt to be too high risk for endoscopic procedures?  Placed on [place], but Protonix40 would be a better option for pt on Plavix rx...    GU- elev PSA> He saw DrHerrick for elev PSA- they approached this conservatively w/ PSA Q3-79mo & consider Bx & hormone therapy if it continues to rise; PSA 03/2016 was ~12 & pt declined Bx (didn't want to  consider XRT), they discussed options & are continued to monitor=> his PSA 3/18 up to 27 & Bx 4/18 showed 4+4=8 and they decided on IMRT from DrManning (+2 yrs of androgen deprivation therapy)- started 02/2017;  Bone scan neg- no mets seen=> IMRT finished 04/2017 & he  continues f/u w/ DrHerrick & Lupron Q2mo=> PSA has been zero.    DJD> he just uses Tylenol as needed...    Anemia & VitB12 defic> on B12 shots monthly & Vit B12 level 2/18 = 240, continue 1032mcg IM Qmonth;  Labs 03/2017 showed Hg=10.0, mcv=92 & rec to take MVI w/ Fe; f/u Labs 1/19 showed Hg=11.1, B12=297... EXAM shows Afeb, VSS- BP=142/72 & pulse=50/reg, O2sat=100% on RA;  HEENT- neg, mallampati1, soft bilat CBruits;  Chest- clear w/o w/r/r;  Heart- RR w/o gr1-2/6SEM w/o r/g;  Abd- soft, neg;  Ext- +arthritic changes, w/o c/c/e;  Neuro- intact...  LABS 08/13/17>  FLP- ok on simva40+Lopid600 x TG=181 & we reviewed diet;  Chems- ok w/ K=5.2, BS=110;  CBC- Hg=11.1, WBC=6.3;  TSH=2.97;  B12=297 IMP/PLAN>>  BP stable- continue same meds + no salt;  Cardiac stable [facility]- continue same;  Chol & Thyroid are OK- continue same meds, discussed how to take them, continue diet + exercise;  DrHerrick continues to follow Prostate- s/p IMRT, on Lupron;  He remains on B12 shots as well... REC to continue same, call for any issues, we will recheck in 16mo...           Problem List:  HYPERTENSION (ICD-401.9) - controlled on TOPROL XL 100mg /d & ALTACE 10mg /d... takes meds regularly and tolerates well...  ~  7/12: BP= 120/68 and OK at home per pt; he gets rare HA which he believes is related to his BP; denies fatigue, visual changes, CP, palipit, dizziness, syncope, dyspnea, edema, etc... ~  CXR 9/12 showed dual lead pacer/ AICD, norm heart size, clear lungs, NAD... ~  2/13:  BP= 124/68 & he remains asymptomatic... ~  8/13:  BP= 122/58 & he denies CP, palpit, SOB, edema, etc... ~  2/14: on MetopER100, Altace10;  BP= 148/60 & he denies CP,  palpit, dizzy, SOB, edema. ~  8/14: on MetopER100 in AM, Altace10 in PM;  BP= 120/70 & he denies CP, palpit, dizzy, SOB, edema. ~  2/15: on MetopER100 & Altace10; BP= 136/64 & he remains asymptomatic... ~  8/15: on MetopER100AM, Altace10PM;  BP= 100/60 & he denies CP, palpit, dizzy, SOB, edema; states feeling well & energy is good ~  CXR 8/15 showed norm heart size, Pacer/ICD stable, stent noted, lungs clear, NAD. ~  2/16:  He remains on MetopER100 & Altace10; BP= 138/80 & he denies any current CP, palpit, SOB, edema.  CAD (ICD-414.00) - on ASA 81mg /d & PLAVIX 75mg /d> followed by Benay Spice... ISCHEMIC CARDIOMYOPATHY (ICD-414.8) - he's had prev MI w/ mult caths/ interventions. CHRONIC SYSTOLIC HEART FAILURE (ZOX-096.04) AORTIC STENOSIS (ICD-424.1) > Mild by 2DEcho 10/11 MITRAL REGURGITATION (ICD-396.3) ~  last cath 5/08 showed norm Lmain, patent stent in prox LAD, 80-90% lesion in mid-LAD w/ PCI & stent placed, & 50% ostial stenosis of 1st diagonal branch of LAD; 40%mid & 70%distal CIRC; 50%prox 40%mid 40%distal RCA; Global HK w/EF=30%. ~  NuclearStressTest 4/09 showed infer scar, no ischemia, EF=40%. ~  2DEcho 10/11 showed mild LVH, decr LVF w/ EF= 35-40% & mild HK, gr1DD, mild AS & MR, mild LAdil. ~  EKG 10/12 showed pacer rhythm... 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  Hx of SYNCOPE (ICD-780.2) AV BLOCK, COMPLETE (ICD-426.0) AUTOMATIC IMPLANTABLE CARDIAC DEFIBRILLATOR SITU (ICD-V45.02) ~  Pacer/ ICD per DrTaylor w/ several adjustments in 2008... ~   5/11:  f/u DrTaylor w/ decision for elective generator change> done 6/11 & tol well. ~  He continues to f/u w/ DrTaylor every 6 months... ~  2/13: f/u DrTaylor doing satis, device working normally, no changes made & f/u 18yr... ~  2/14: Hx Syncope, AV block, Implanted Defib> followed by DrTaylor w/ pacer checks every 2-56mo he says; generator changed 6/11; he denies CP/angina, dizzy/ syncope, etc. ~  2/15: he had f/u DrTaylor> stable & doing satis...  HYPERCHOLESTEROLEMIA (ICD-272.0) - takes SIMVASTATIN 40mg /d, GEMFIBRIZOL 600mg /d, & FISH OIL Bid... ~  [place] 08/14/07 on Simva40 showed TChol 163, TG 94, HDL 34, LDL 111... rec incr Simvastatin to 80mg /d. ~  FLP 1/10 showed TChol 125, TG 67, HDL 32, LDL 80... rec- continue same. ~  FLP 1/11 on Simva80+Gemfib600 showed TChol 137, TG 99, HDL 39, LDL 78 ~  FLP 1/12 on Simva40+Gemfib600 showed TChol 157, TG 139, HDL 38, LDL 92 ~  FLP 1/13 on Simva40+Gemfib600 showed TChol 149, TG 50, HDL 39, LDL 100 ~  FLP 1/14 on Simva40+Gemfib600 showed TChol 152, TG 74, HDL 38, LDL 99  ~  FLP 1/15 on Simva40+Gemfib600 showed TChol 159, TG 64, HDL 43, LDL 103 ~  FLP 6/15 on Simva40+Gemfib600 showed TChol 168, TG 117, HDL 41, LDL 104  ~  FLP 1/16 on Simva40+Gemfib600 showed TChol 148, TG 111, HDL 45, LDL 81  BORDERLINE TSH >> ~  Labs 1/15 showed TSH= 3.78 ~  Labs 1/16 showed an elev TSH for the 1st time w/ TSH= 5.40, he is clinically euthyroid.  GERD (ICD-530.81) - he had ERCP in 8/06 w/ norm ducts, +gallstones, s/p lap chole 2006... he uses OTC meds Prn... ~  he notes that he had a colonoscopy in the past from DrPatterson, & he reports that it was neg. ~  7/15> GI eval from DrPerry, he didn't comment on the eosinophilia...  ELEVATED PSA >> ~  Labs 1/16 showed an elev PSA for the 1st time w/ PSA= 5.27  DEGENERATIVE JOINT DISEASE (ICD-715.90) - he tried Mobic but it bothered his stomach, therefore uses Tylenol Prn.  VITAMIN B12 DEFICIENCY (ICD-266.2) - old chart not  available- ? details of this Dx... on B12 shots xyrs. ~  labs 1/09 showed Hg=12.9 ~  labs 1/10 showed Hg= 12.7 ~  labs 1/11 showed Hg= 12.6 ~  labs 1/12 showed Hg= 13.2 ~  Labs 1/13 showed Hg= 12.6 ~  Labs 1/14 showed Hg= 12.7 ~  Labs 1/15 showed Hg= 12.6, WBC= 5.7 w/ 10%eos ~  Labs 7/15 showed Hg= 11.7, WBC= 16.9 w/ 57%eos... ~  Labs 8/15 showed Hg= 11.6, WBC= 6.9, w/ 17%eos... ~  Labs 1/16 showed Hg= 12.7, WBC= 4.4 w/ 9% eos  Health Maintenance: ~  GI:  Followed by DrPatterson ~  GU:  PSA checked yearly & remains wnl... ~  Immunizations:  he gets the yearly Flu vaccine;  [place] says;  given Pneumovax 1/11 age 89.   Past Surgical History:  Procedure Laterality Date  . CARDIAC CATHETERIZATION  2008  . icd implanted  2005  . LAPAROSCOPIC  CHOLECYSTECTOMY  04/2005   Dr. Lucia Gaskins  . ROTATOR CUFF REPAIR      Outpatient Encounter Medications as of 08/13/2017  Medication Sig  . aspirin 81 MG tablet Take 81 mg by mouth daily.    . clopidogrel (PLAVIX) 75 MG tablet Take 1 tablet (75 mg total) by mouth daily.  . cyanocobalamin (,VITAMIN B-12,) 1000 MCG/ML injection INJECT 1 ML INTO THE MUSCLE EVERY 30 DAYS  . fish oil-omega-3 fatty acids 1000 MG capsule Take 2 g by mouth daily.    Marland Kitchen gemfibrozil (LOPID) 600 MG tablet Take 1 tablet (600 mg total) by mouth daily.  Marland Kitchen levothyroxine (SYNTHROID) 50 MCG tablet Take 1 tablet (50 mcg total) by mouth daily before breakfast.  . metoprolol succinate (TOPROL-XL) 50 MG 24 hr tablet Take 1 tablet (50 mg total) by mouth daily. Take with or immediately following a meal.  . Multiple Vitamins-Minerals (MENS MULTIVITAMIN PLUS PO) Take 1 tablet by mouth daily.    Marland Kitchen omeprazole (PRILOSEC) 20 MG capsule Take 1 capsule (20 mg total) by mouth daily. Take 30 minutes before a meal  . Polyethylene Glycol POWD Take 1 scoop by mouth daily.   . ramipril (ALTACE) 10 MG capsule Take 1 capsule (10 mg total) by mouth daily.  . simvastatin  (ZOCOR) 40 MG tablet Take 1 tablet (40 mg total) by mouth at bedtime.  . [DISCONTINUED] clopidogrel (PLAVIX) 75 MG tablet Take 1 tablet (75 mg total) by mouth daily.  . [DISCONTINUED] gemfibrozil (LOPID) 600 MG tablet Take 1 tablet (600 mg total) by mouth daily. (Patient taking differently: Take 600 mg by mouth daily. HOLD)  . [DISCONTINUED] levothyroxine (SYNTHROID) 50 MCG tablet Take 1 tablet (50 mcg total) by mouth daily before breakfast.  . [DISCONTINUED] metoprolol succinate (TOPROL-XL) 100 MG 24 hr tablet Take 1 tablet (100 mg total) by mouth daily. Take with or immediately following a meal. (Patient taking differently: Take 50 mg by mouth daily. Take with or immediately following a meal.)  . [DISCONTINUED] omeprazole (PRILOSEC) 20 MG capsule Take 1 capsule (20 mg total) by mouth daily. Take 30 minutes before a meal  . [DISCONTINUED] ramipril (ALTACE) 10 MG capsule Take 1 capsule (10 mg total) by mouth daily.  . [DISCONTINUED] simvastatin (ZOCOR) 40 MG tablet Take 1 tablet (40 mg total) by mouth at bedtime.   No facility-administered encounter medications on file as of 08/13/2017.     No Known Allergies    Immunization History  Administered Date(s) Administered  . H1N1 08/04/2008  . Influenza Split 05/10/2012, 05/11/2013  . Influenza Whole 05/04/2008, 06/21/2009, 05/08/2010  . Influenza, High Dose Seasonal PF 05/01/2017  . Influenza,inj,Quad PF,6+ Mos 04/09/2014, 05/07/2016  . Influenza-Unspecified 05/07/2015  . Pneumococcal Conjugate-13 09/09/2014  . Pneumococcal Polysaccharide-23 05/17/2000, 08/30/2009    Current Medications, Allergies, Past Medical History, Past Surgical History, Family History, and Social History were reviewed in [employer] record.    Review of Systems        See HPI - all other systems neg except as noted...  The patient complains of dyspnea on exertion.  The patient denies anorexia, fever, weight loss, weight gain, vision loss,  decreased hearing, hoarseness, chest pain, syncope, peripheral edema, prolonged cough, headaches, hemoptysis, abdominal pain, melena, hematochezia, severe indigestion/heartburn, hematuria, incontinence, muscle weakness, suspicious skin lesions, transient blindness, difficulty walking, depression, unusual weight change, abnormal bleeding, enlarged lymph nodes, and angioedema.     Objective:   Physical Exam     WD, WN, 84 y/o WM in  NAD... wt=163# ht=6'1" BMI=21... GENERAL:  Alert & oriented; pleasant & cooperative... HEENT:  [place]/AT, EOM-wnl, PERRLA, EACs-clear, TMs-wnl, NOSE-clear, THROAT-clear & wnl. NECK:  Supple w/ fairROM; no JVD; normal carotid impulses w/o bruits; no thyromegaly or nodules palpated; no lymphadenopathy. CHEST:  Clear to P & A; without wheezes/ rales/ or rhonchi heard... HEART:  Pacer in L upper chest area~ nontender, RR, gr 1/6 SEM without rubs or gallops detected... ABDOMEN:  Soft & nontender; normal bowel sounds; no organomegaly or masses palpated... EXT: without deformities, mild arthritic changes; no varicose veins/ venous insuffic/ or edema. NEURO:  CN's intact; no focal neuro deficits apprec... DERM:  No lesions noted; no rash etc...  RADIOLOGY DATA:  Reviewed in the EPIC EMR & discussed w/ the patient...   LABORATORY DATA:  Reviewed in the EPIC EMR & discussed w/ the patient...     Assessment & Plan:    05/13/17>   OK to continue ToprolXL50 & OK to put the Lopid on hold- stressing diet & continue the Simva40 + fish Oil supplement;  He will continue the synthroid50 as well & we plan ROv recehck in 83mo 08/13/17>   BP stable- continue same meds + no salt;  Cardiac stable monitored by [facility]- continue same;  Chol & Thyroid are OK- continue same meds, discussed how to take them, continue diet + exercise;  DrHerrick continues to follow Prostate- s/p IMRT, on Lupron;  He remains on B12 shots as well... REC to continue same, call for any issues, we will recheck in  38mo...   HBP>  Controlled on Metoprolol & Ramipril, continue same...  CAD, Cardiomyopathy, CHF, ValvHeartDis w/ AS & MR>  Followed by DrMcAlhany/ [name] & stable on ASA/ Plavix, he has retired from work as a Museum/gallery conservator at [employer]...  Hx syncope, AVBlock, Pacer/ICD>  Followed by DrTaylor & stable, continue same...  LIPIDS>  FLP looks OK on Simva40, Gemfib 600mg /d, & Fish Oil;  10/18 he reports that [facility] can't get the Lopid=> back on it now...  HYPOTHYROID>  TSH returned at 7.10 in 03/2017 => we started Synthroid50 & repeat TSH= 0.97  GI symptoms 7/15>  Resolved w/ Colace & Miralax... He feels he is back to baseline... 03/2016>  He has mild anemia w/ Hg=10.4 & stool card pos for occult blood => refer to DrPerry for further eval... Not felt to be an endoscopic candidate- rec to take Omep20 by DrPerry... 2/18>  [name] has finally trired!  Refills provided for 90d supplies per his request- change to Protonix40... 8/18>   [name] is well into his IMRT for the prostate cancer, starting to experience some side effects, feeling weak- labs show TSH=7.10 & we are starting Levothy66mcg/d, and his Hg=10 rec to start an MVI w/ Fe; he will continue the IMRT & planned androgen depriv therapy per Urology & RadOnc.Estill Batten PSA/ prostate cancer => followed by [facility] for Urology on watchful waiting...  DJD>  He uses Tylenol as needed; still working regularly...  B-12 defic>  He remains on B12 shots monthly...     Medication List        Accurate as of 08/13/17  1:22 PM. Always use your most recent med list.          aspirin 81 MG tablet   clopidogrel 75 MG tablet Commonly known as:  PLAVIX Take 1 tablet (75 mg total) by mouth daily.   cyanocobalamin 1000 MCG/ML injection Commonly known as:  (VITAMIN B-12) INJECT 1 ML INTO THE MUSCLE  EVERY 30 DAYS   fish oil-omega-3 fatty acids 1000 MG capsule   gemfibrozil 600 MG tablet Commonly known as:  LOPID Take 1 tablet (600 mg total) by  mouth daily.   levothyroxine 50 MCG tablet Commonly known as:  SYNTHROID Take 1 tablet (50 mcg total) by mouth daily before breakfast.   MENS MULTIVITAMIN PLUS PO   metoprolol succinate 50 MG 24 hr tablet Commonly known as:  TOPROL-XL Take 1 tablet (50 mg total) by mouth daily. Take with or immediately following a meal.   omeprazole 20 MG capsule Commonly known as:  PRILOSEC Take 1 capsule (20 mg total) by mouth daily. Take 30 minutes before a meal   Polyethylene Glycol Powd   ramipril 10 MG capsule Commonly known as:  ALTACE Take 1 capsule (10 mg total) by mouth daily.   simvastatin 40 MG tablet Commonly known as:  ZOCOR Take 1 tablet (40 mg total) by mouth at bedtime.       Where to Get Your Medications    These medications were sent to [facility] [place] [facility]  [place], [place] [address]   Phone:  [phone]   clopidogrel 75 MG tablet  gemfibrozil 600 MG tablet  levothyroxine 50 MCG tablet  metoprolol succinate 50 MG 24 hr tablet  omeprazole 20 MG capsule  ramipril 10 MG capsule  simvastatin 40 MG tablet

## 2017-08-13 NOTE — Patient Instructions (Signed)
Today we updated your med list in our EPIC system...    Continue your current medications the same...  Today we did your follow up FASTING blood work...    We will contact you w/ the results when available...   Call for any questions...  Let's plan a follow up visit in 21mo, sooner if needed for problems.Marland KitchenMarland Kitchen

## 2017-08-16 LAB — CUP PACEART REMOTE DEVICE CHECK
Implantable Lead Implant Date: 20050509
Implantable Lead Model: 1580
Lead Channel Setting Pacing Amplitude: 2 V
Lead Channel Setting Sensing Sensitivity: 0.5 mV
MDC IDC LEAD IMPLANT DT: 20050509
MDC IDC LEAD LOCATION: 753859
MDC IDC LEAD LOCATION: 753860
MDC IDC PG IMPLANT DT: 20110609
MDC IDC SESS DTM: 20190111200640
MDC IDC SET LEADCHNL RA PACING AMPLITUDE: 2 V
MDC IDC SET LEADCHNL RV PACING PULSEWIDTH: 0.5 ms
Pulse Gen Serial Number: 610170

## 2017-08-21 DIAGNOSIS — S93491A Sprain of other ligament of right ankle, initial encounter: Secondary | ICD-10-CM | POA: Diagnosis not present

## 2017-08-29 ENCOUNTER — Ambulatory Visit (INDEPENDENT_AMBULATORY_CARE_PROVIDER_SITE_OTHER): Payer: Medicare Other

## 2017-08-29 DIAGNOSIS — E538 Deficiency of other specified B group vitamins: Secondary | ICD-10-CM | POA: Diagnosis not present

## 2017-08-30 MED ORDER — CYANOCOBALAMIN 1000 MCG/ML IJ SOLN
1000.0000 ug | Freq: Once | INTRAMUSCULAR | Status: AC
Start: 2017-08-29 — End: 2017-08-29
  Administered 2017-08-29: 1000 ug via INTRAMUSCULAR

## 2017-08-30 NOTE — Progress Notes (Signed)
Documentation of medication administration and charges of Vitamin B12 have been completed by Cherree Conerly, CMA based on the Vitamin B12 documentation sheet completed by Tammy Scott.  

## 2017-09-02 DIAGNOSIS — Z8546 Personal history of malignant neoplasm of prostate: Secondary | ICD-10-CM | POA: Diagnosis not present

## 2017-09-09 DIAGNOSIS — Z8546 Personal history of malignant neoplasm of prostate: Secondary | ICD-10-CM | POA: Diagnosis not present

## 2017-09-09 DIAGNOSIS — N481 Balanitis: Secondary | ICD-10-CM | POA: Diagnosis not present

## 2017-09-12 ENCOUNTER — Telehealth: Payer: Self-pay | Admitting: Pulmonary Disease

## 2017-09-12 DIAGNOSIS — H9190 Unspecified hearing loss, unspecified ear: Secondary | ICD-10-CM

## 2017-09-12 NOTE — Telephone Encounter (Signed)
Called and spoke with patient daughter SN please advise she is asking that we send in a referral for hearing aids.

## 2017-09-13 NOTE — Telephone Encounter (Signed)
Per SN: Please place ENT referral.   Pt notified and informed.   PCC's informed to call shiela to schedule appt...[phone]

## 2017-09-13 NOTE — Telephone Encounter (Signed)
[place], please advise if SN has reviewed this. Thanks!

## 2017-09-16 ENCOUNTER — Ambulatory Visit: Payer: Medicare Other | Admitting: Pulmonary Disease

## 2017-09-17 DIAGNOSIS — H9113 Presbycusis, bilateral: Secondary | ICD-10-CM | POA: Diagnosis not present

## 2017-09-17 DIAGNOSIS — H903 Sensorineural hearing loss, bilateral: Secondary | ICD-10-CM | POA: Diagnosis not present

## 2017-09-20 DIAGNOSIS — H903 Sensorineural hearing loss, bilateral: Secondary | ICD-10-CM | POA: Insufficient documentation

## 2017-09-20 DIAGNOSIS — H6122 Impacted cerumen, left ear: Secondary | ICD-10-CM | POA: Insufficient documentation

## 2017-09-30 ENCOUNTER — Ambulatory Visit (INDEPENDENT_AMBULATORY_CARE_PROVIDER_SITE_OTHER): Payer: Medicare Other

## 2017-09-30 DIAGNOSIS — E538 Deficiency of other specified B group vitamins: Secondary | ICD-10-CM

## 2017-10-01 MED ORDER — CYANOCOBALAMIN 1000 MCG/ML IJ SOLN
1000.0000 ug | Freq: Once | INTRAMUSCULAR | Status: AC
  Administered 2017-09-30: 1000 ug via INTRAMUSCULAR

## 2017-10-17 ENCOUNTER — Ambulatory Visit (INDEPENDENT_AMBULATORY_CARE_PROVIDER_SITE_OTHER): Payer: Medicare Other | Admitting: *Deleted

## 2017-10-17 DIAGNOSIS — I255 Ischemic cardiomyopathy: Secondary | ICD-10-CM | POA: Diagnosis not present

## 2017-10-17 NOTE — Progress Notes (Signed)
Remote ICD transmission.   

## 2017-10-18 ENCOUNTER — Encounter: Payer: Self-pay | Admitting: Cardiology

## 2017-10-22 ENCOUNTER — Encounter: Payer: Self-pay | Admitting: Internal Medicine

## 2017-10-22 ENCOUNTER — Ambulatory Visit (INDEPENDENT_AMBULATORY_CARE_PROVIDER_SITE_OTHER): Payer: Medicare Other | Admitting: Internal Medicine

## 2017-10-22 VITALS — BP 130/70 | HR 52 | Ht 71.0 in | Wt 167.6 lb

## 2017-10-22 DIAGNOSIS — I442 Atrioventricular block, complete: Secondary | ICD-10-CM

## 2017-10-22 DIAGNOSIS — Z95810 Presence of automatic (implantable) cardiac defibrillator: Secondary | ICD-10-CM

## 2017-10-22 DIAGNOSIS — I255 Ischemic cardiomyopathy: Secondary | ICD-10-CM | POA: Diagnosis not present

## 2017-10-22 DIAGNOSIS — I5022 Chronic systolic (congestive) heart failure: Secondary | ICD-10-CM

## 2017-10-22 LAB — CUP PACEART INCLINIC DEVICE CHECK
Brady Statistic RA Percent Paced: 32 %
Implantable Lead Implant Date: 20050509
Implantable Lead Implant Date: 20050509
Implantable Lead Model: 1580
Implantable Pulse Generator Implant Date: 20110609
Lead Channel Pacing Threshold Amplitude: 1 V
Lead Channel Pacing Threshold Pulse Width: 0.5 ms
Lead Channel Sensing Intrinsic Amplitude: 2.5 mV
Lead Channel Setting Pacing Amplitude: 2 V
Lead Channel Setting Pacing Amplitude: 2 V
Lead Channel Setting Sensing Sensitivity: 0.5 mV
MDC IDC LEAD LOCATION: 753859
MDC IDC LEAD LOCATION: 753860
MDC IDC MSMT BATTERY REMAINING LONGEVITY: 13 mo
MDC IDC MSMT LEADCHNL RA PACING THRESHOLD AMPLITUDE: 0.75 V
MDC IDC MSMT LEADCHNL RV PACING THRESHOLD PULSEWIDTH: 0.5 ms
MDC IDC SESS DTM: 20190319085113
MDC IDC SET LEADCHNL RV PACING PULSEWIDTH: 0.5 ms
MDC IDC STAT BRADY RV PERCENT PACED: 99.56 %
Pulse Gen Serial Number: 610170

## 2017-10-22 NOTE — Patient Instructions (Signed)
Medication Instructions:  Your physician recommends that you continue on your current medications as directed. Please refer to the Current Medication list given to you today.  Labwork: None ordered.  Testing/Procedures: None ordered.  Follow-Up: Your physician wants you to follow-up in: one year with Dr. [name].   You will receive a reminder letter in the mail two months in advance. If you don't receive a letter, please call our office to schedule the follow-up appointment.  Remote monitoring is used to monitor your ICD from home. This monitoring reduces the number of office visits required to check your device to one time per year. It allows [place] to keep an eye on the functioning of your device to ensure it is working properly. You are scheduled for a device check from home on 01/16/2018. You may send your transmission at any time that day. If you have a wireless device, the transmission will be sent automatically. After your physician reviews your transmission, you will receive a postcard with your next transmission date.  Any Other Special Instructions Will Be Listed Below (If Applicable).  If you need a refill on your cardiac medications before your next appointment, please call your pharmacy.

## 2017-10-22 NOTE — Progress Notes (Signed)
HPI [name] returns today for followup. He is a very pleasant 84 year old man with an ischemic cardiomyopathy, complete heart block, dyslipidemia, chronic systolic heart failure, status post biventricular ICD implantation, with no attempt to place left ventricular lead because of his lack of class II or 3 symptoms. In the interim, he has been treated for prostate CA. He denies chest pain, shortness of breath, peripheral edema, syncope, and has had no ICD shocks. While he was undergoing treatment for his prostate CA, he did have some dizziness and had to hold his Ramipril which was started back.  No Known Allergies   Current Outpatient Medications  Medication Sig Dispense Refill  . aspirin 81 MG tablet Take 81 mg by mouth daily.      . clopidogrel (PLAVIX) 75 MG tablet Take 1 tablet (75 mg total) by mouth daily. 90 tablet 4  . cyanocobalamin (,VITAMIN B-12,) 1000 MCG/ML injection INJECT 1 ML INTO THE MUSCLE EVERY 30 DAYS 1 mL 5  . fish oil-omega-3 fatty acids 1000 MG capsule Take 2 g by mouth daily.      Marland Kitchen gemfibrozil (LOPID) 600 MG tablet Take 1 tablet (600 mg total) by mouth daily. 90 tablet 3  . levothyroxine (SYNTHROID) 50 MCG tablet Take 1 tablet (50 mcg total) by mouth daily before breakfast. 90 tablet 3  . metoprolol succinate (TOPROL-XL) 50 MG 24 hr tablet Take 1 tablet (50 mg total) by mouth daily. Take with or immediately following a meal. 90 tablet 3  . Multiple Vitamins-Minerals (MENS MULTIVITAMIN PLUS PO) Take 1 tablet by mouth daily.      Marland Kitchen omeprazole (PRILOSEC) 20 MG capsule Take 1 capsule (20 mg total) by mouth daily. Take 30 minutes before a meal 90 capsule 3  . Polyethylene Glycol POWD Take 1 scoop by mouth daily.     . ramipril (ALTACE) 10 MG capsule Take 1 capsule (10 mg total) by mouth daily. 90 capsule 3  . simvastatin (ZOCOR) 40 MG tablet Take 1 tablet (40 mg total) by mouth at bedtime. 90 tablet 2   No current facility-administered medications for this visit.       Past Medical History:  Diagnosis Date  . Anemia, unspecified   . Aortic stenosis    Mild, echo, October, 2011  . AV block, complete (HCC)    Permanent pacemaker  . CAD (coronary artery disease)    DES to LAD 2005 /  DES to LAD 2008  . Cardiomyopathy    Ischemic, EF 30%, 2005  . DJD (degenerative joint disease)   . Dyslipidemia   . Ejection fraction < 50%    EF 30%, echo, 2005  /  EF 30%, catheterization, 2008  . GERD (gastroesophageal reflux disease)   . Hyperlipidemia   . Hypertension   . ICD (implantable cardiac defibrillator), single, in situ    for syncope and arrhythmia 2005 / generator change June, 2011  . LBBB (left bundle branch block)   . Mitral regurgitation    Mild, echo, October, 2011  . Prostate cancer ([place])   . Rectal fissure   . Syncope    2005, ICD placed  . Systolic CHF, chronic ([place])   . Vitamin B12 deficiency     ROS:   All systems reviewed and negative except as noted in the HPI.   Past Surgical History:  Procedure Laterality Date  . CARDIAC CATHETERIZATION  2008  . icd implanted  2005  . LAPAROSCOPIC CHOLECYSTECTOMY  04/2005   Dr.  Newman  . ROTATOR CUFF REPAIR       Family History  Problem Relation Age of Onset  . Stroke Mother   . Cancer Mother 60       unknown  . Heart attack Father 18  . Cancer Brother        lung  . Cancer Sister        lung  . Stroke Brother   . Lung cancer Brother   . Cancer Maternal Aunt        breast     Social History   Socioeconomic History  . Marital status: Widowed    Spouse name: Not on file  . Number of children: 2  . Years of education: Not on file  . Highest education level: Not on file  Social Needs  . Financial resource strain: Not on file  . Food insecurity - worry: Not on file  . Food insecurity - inability: Not on file  . Transportation needs - medical: Not on file  . Transportation needs - non-medical: Not on file  Occupational History  . Occupation: repairs looms at [employer]  Tobacco Use  . Smoking status: Former Smoker    Packs/day: 1.00    Years: 2.00    Pack years: 2.00    Types: Cigarettes    Last attempt to quit: 08/06/1952    Years since quitting: 65.2  . Smokeless tobacco: Never Used  Substance and Sexual Activity  . Alcohol use: No    Alcohol/week: 0.0 oz  . Drug use: No  . Sexual activity: Not Currently  Other Topics Concern  . Not on file  Social History Narrative  . Not on file     There were no vitals taken for this visit.  Physical Exam:  Well appearing NAD HEENT: Unremarkable Neck:  No JVD, no thyromegally Lymphatics:  No adenopathy Back:  No CVA tenderness Lungs:  Clear HEART:  Regular rate rhythm, no murmurs, no rubs, no clicks Abd:  soft, positive bowel sounds, no organomegally, no rebound, no guarding Ext:  2 plus pulses, no edema, no cyanosis, no clubbing Skin:  No rashes no nodules Neuro:  CN II through XII intact, motor grossly intact  EKG - nsr with pacing induced LBBB, QRS 228  DEVICE  Normal device function.  See PaceArt for details. Approaching ERI.  Assess/Plan: 1. Chronic systolic heart failure - despite CHB and severe LV dysfunction, his symptoms remain class 2. He will continue his current meds. I would anticipate downgrading him to a Biv PPM with a LV lead when he reaches ERI. 2. CHB - he is asymptomatic, pacing nearly 100% of the time. He has no escape today. 3. CAD - he denies anginal symptoms. Will follow.  4. ICD - his [employer] device is working normally. Will recheck in several months.  [name], M.D.

## 2017-10-28 ENCOUNTER — Ambulatory Visit (INDEPENDENT_AMBULATORY_CARE_PROVIDER_SITE_OTHER): Payer: Medicare Other

## 2017-10-28 DIAGNOSIS — E538 Deficiency of other specified B group vitamins: Secondary | ICD-10-CM

## 2017-10-29 MED ORDER — CYANOCOBALAMIN 1000 MCG/ML IJ SOLN
1000.0000 ug | Freq: Once | INTRAMUSCULAR | Status: AC
Start: 2017-10-29 — End: 2017-10-28
  Administered 2017-10-28: 1000 ug via INTRAMUSCULAR

## 2017-10-29 NOTE — Progress Notes (Signed)
Vitamin B12 injection documentation and charges entered by Maury Dus, RMA, based on injection sheet filled out by [facility] during preparation and administration. This documentation process is due to office requirements.

## 2017-11-03 LAB — CUP PACEART REMOTE DEVICE CHECK
Date Time Interrogation Session: 20190331193115
Implantable Lead Implant Date: 20050509
Implantable Lead Model: 1580
Implantable Pulse Generator Implant Date: 20110609
Lead Channel Setting Pacing Amplitude: 2 V
Lead Channel Setting Sensing Sensitivity: 0.5 mV
MDC IDC LEAD IMPLANT DT: 20050509
MDC IDC LEAD LOCATION: 753859
MDC IDC LEAD LOCATION: 753860
MDC IDC PG SERIAL: 610170
MDC IDC SET LEADCHNL RV PACING AMPLITUDE: 2 V
MDC IDC SET LEADCHNL RV PACING PULSEWIDTH: 0.5 ms

## 2017-11-28 ENCOUNTER — Ambulatory Visit (INDEPENDENT_AMBULATORY_CARE_PROVIDER_SITE_OTHER): Payer: Medicare Other

## 2017-11-28 DIAGNOSIS — E538 Deficiency of other specified B group vitamins: Secondary | ICD-10-CM | POA: Diagnosis not present

## 2017-11-29 DIAGNOSIS — E538 Deficiency of other specified B group vitamins: Secondary | ICD-10-CM | POA: Diagnosis not present

## 2017-11-29 MED ORDER — CYANOCOBALAMIN 1000 MCG/ML IJ SOLN
1000.0000 ug | Freq: Once | INTRAMUSCULAR | Status: AC
  Administered 2017-11-29: 1000 ug via INTRAMUSCULAR

## 2017-11-29 NOTE — Progress Notes (Signed)
Documentation of medication administration and charges of Vitamin B12 have been completed by Lindsay Lemons, CMA based on the Vitamin B12 documentation sheet completed by Tammy Scott.  

## 2017-12-09 DIAGNOSIS — Z8546 Personal history of malignant neoplasm of prostate: Secondary | ICD-10-CM | POA: Diagnosis not present

## 2017-12-16 DIAGNOSIS — C61 Malignant neoplasm of prostate: Secondary | ICD-10-CM | POA: Diagnosis not present

## 2018-01-01 DIAGNOSIS — H2513 Age-related nuclear cataract, bilateral: Secondary | ICD-10-CM | POA: Diagnosis not present

## 2018-01-02 ENCOUNTER — Ambulatory Visit (INDEPENDENT_AMBULATORY_CARE_PROVIDER_SITE_OTHER): Payer: Medicare Other

## 2018-01-02 DIAGNOSIS — E538 Deficiency of other specified B group vitamins: Secondary | ICD-10-CM | POA: Diagnosis not present

## 2018-01-03 DIAGNOSIS — E538 Deficiency of other specified B group vitamins: Secondary | ICD-10-CM

## 2018-01-03 MED ORDER — CYANOCOBALAMIN 1000 MCG/ML IJ SOLN
1000.0000 ug | Freq: Once | INTRAMUSCULAR | Status: AC
  Administered 2018-01-03: 1000 ug via INTRAMUSCULAR

## 2018-01-16 ENCOUNTER — Ambulatory Visit (INDEPENDENT_AMBULATORY_CARE_PROVIDER_SITE_OTHER): Payer: Medicare Other | Admitting: *Deleted

## 2018-01-16 DIAGNOSIS — I5022 Chronic systolic (congestive) heart failure: Secondary | ICD-10-CM

## 2018-01-16 DIAGNOSIS — I10 Essential (primary) hypertension: Secondary | ICD-10-CM | POA: Diagnosis not present

## 2018-01-16 NOTE — Progress Notes (Signed)
Remote ICD transmission.   

## 2018-01-31 ENCOUNTER — Ambulatory Visit (INDEPENDENT_AMBULATORY_CARE_PROVIDER_SITE_OTHER): Payer: Medicare Other

## 2018-01-31 DIAGNOSIS — E538 Deficiency of other specified B group vitamins: Secondary | ICD-10-CM | POA: Diagnosis not present

## 2018-02-03 DIAGNOSIS — E538 Deficiency of other specified B group vitamins: Secondary | ICD-10-CM

## 2018-02-03 MED ORDER — CYANOCOBALAMIN 1000 MCG/ML IJ SOLN
1000.0000 ug | Freq: Once | INTRAMUSCULAR | Status: AC
Start: 2018-01-31 — End: 2018-02-03
  Administered 2018-02-03: 1000 ug via INTRAMUSCULAR

## 2018-02-03 NOTE — Progress Notes (Signed)
Documentation of medication administration and charges of Vitamin B12 have been completed by Lindsay Lemons, CMA based on the Vitamin B12 documentation sheet completed by Tammy Scott.  

## 2018-02-04 ENCOUNTER — Telehealth: Payer: Self-pay | Admitting: Pulmonary Disease

## 2018-02-04 DIAGNOSIS — E538 Deficiency of other specified B group vitamins: Secondary | ICD-10-CM

## 2018-02-04 DIAGNOSIS — I10 Essential (primary) hypertension: Secondary | ICD-10-CM

## 2018-02-04 DIAGNOSIS — E039 Hypothyroidism, unspecified: Secondary | ICD-10-CM

## 2018-02-04 NOTE — Telephone Encounter (Signed)
Per SN- ok for BMP, CBC, TSH to be ordered prior to OV. Labs ordered. Patient daughter Freda Munro notified and stated understanding.  Nothing further at this time.

## 2018-02-04 NOTE — Telephone Encounter (Signed)
Called and spoke to [place], patient's daughter. She would like to know if patient need to have lab work before his appointment on the 9th.  Dr. Lenna Gilford please advise.

## 2018-02-07 ENCOUNTER — Other Ambulatory Visit (INDEPENDENT_AMBULATORY_CARE_PROVIDER_SITE_OTHER): Payer: Medicare Other

## 2018-02-07 DIAGNOSIS — E538 Deficiency of other specified B group vitamins: Secondary | ICD-10-CM

## 2018-02-07 DIAGNOSIS — I10 Essential (primary) hypertension: Secondary | ICD-10-CM | POA: Diagnosis not present

## 2018-02-07 DIAGNOSIS — E039 Hypothyroidism, unspecified: Secondary | ICD-10-CM

## 2018-02-07 LAB — CUP PACEART REMOTE DEVICE CHECK
Date Time Interrogation Session: 20190705095224
Implantable Lead Implant Date: 20050509
Implantable Lead Location: 753859
Implantable Pulse Generator Implant Date: 20110609
Lead Channel Pacing Threshold Amplitude: 1.125 V
Lead Channel Sensing Intrinsic Amplitude: 2 mV
Lead Channel Setting Pacing Amplitude: 2 V
Lead Channel Setting Pacing Pulse Width: 0.5 ms
MDC IDC LEAD IMPLANT DT: 20050509
MDC IDC LEAD LOCATION: 753860
MDC IDC MSMT LEADCHNL RV PACING THRESHOLD PULSEWIDTH: 0.5 ms
MDC IDC PG SERIAL: 610170
MDC IDC SET LEADCHNL RV PACING AMPLITUDE: 2 V
MDC IDC SET LEADCHNL RV SENSING SENSITIVITY: 0.5 mV

## 2018-02-07 LAB — TSH: TSH: 3.63 u[IU]/mL (ref 0.35–4.50)

## 2018-02-07 LAB — CBC WITH DIFFERENTIAL/PLATELET
BASOS ABS: 0 10*3/uL (ref 0.0–0.1)
Basophils Relative: 0.5 % (ref 0.0–3.0)
EOS ABS: 0.3 10*3/uL (ref 0.0–0.7)
Eosinophils Relative: 8.4 % — ABNORMAL HIGH (ref 0.0–5.0)
HCT: 25.6 % — ABNORMAL LOW (ref 39.0–52.0)
Hemoglobin: 9.1 g/dL — ABNORMAL LOW (ref 13.0–17.0)
LYMPHS ABS: 0.9 10*3/uL (ref 0.7–4.0)
Lymphocytes Relative: 24.1 % (ref 12.0–46.0)
MCHC: 35.7 g/dL (ref 30.0–36.0)
MCV: 93.3 fl (ref 78.0–100.0)
Monocytes Absolute: 0.5 10*3/uL (ref 0.1–1.0)
Monocytes Relative: 11.9 % (ref 3.0–12.0)
NEUTROS PCT: 55.1 % (ref 43.0–77.0)
Neutro Abs: 2.1 10*3/uL (ref 1.4–7.7)
Platelets: 213 10*3/uL (ref 150.0–400.0)
RBC: 2.75 Mil/uL — AB (ref 4.22–5.81)
RDW: 13.9 % (ref 11.5–15.5)
WBC: 3.8 10*3/uL — ABNORMAL LOW (ref 4.0–10.5)

## 2018-02-07 LAB — BASIC METABOLIC PANEL
BUN: 30 mg/dL — ABNORMAL HIGH (ref 6–23)
CALCIUM: 9.6 mg/dL (ref 8.4–10.5)
CO2: 24 meq/L (ref 19–32)
Chloride: 107 mEq/L (ref 96–112)
Creatinine, Ser: 1.38 mg/dL (ref 0.40–1.50)
GFR: 52.22 mL/min — ABNORMAL LOW (ref >60.00)
Glucose, Bld: 99 mg/dL (ref 70–99)
POTASSIUM: 4.4 meq/L (ref 3.5–5.1)
SODIUM: 141 meq/L (ref 135–145)

## 2018-02-11 ENCOUNTER — Ambulatory Visit (INDEPENDENT_AMBULATORY_CARE_PROVIDER_SITE_OTHER): Payer: Medicare Other | Admitting: Pulmonary Disease

## 2018-02-11 ENCOUNTER — Other Ambulatory Visit (INDEPENDENT_AMBULATORY_CARE_PROVIDER_SITE_OTHER): Payer: Medicare Other

## 2018-02-11 ENCOUNTER — Encounter: Payer: Self-pay | Admitting: Pulmonary Disease

## 2018-02-11 VITALS — BP 138/62 | HR 52 | Temp 97.6°F | Ht 71.0 in | Wt 168.4 lb

## 2018-02-11 DIAGNOSIS — K5909 Other constipation: Secondary | ICD-10-CM | POA: Diagnosis not present

## 2018-02-11 DIAGNOSIS — I350 Nonrheumatic aortic (valve) stenosis: Secondary | ICD-10-CM

## 2018-02-11 DIAGNOSIS — K624 Stenosis of anus and rectum: Secondary | ICD-10-CM | POA: Diagnosis not present

## 2018-02-11 DIAGNOSIS — I2510 Atherosclerotic heart disease of native coronary artery without angina pectoris: Secondary | ICD-10-CM | POA: Diagnosis not present

## 2018-02-11 DIAGNOSIS — M150 Primary generalized (osteo)arthritis: Secondary | ICD-10-CM | POA: Diagnosis not present

## 2018-02-11 DIAGNOSIS — M159 Polyosteoarthritis, unspecified: Secondary | ICD-10-CM

## 2018-02-11 DIAGNOSIS — I5022 Chronic systolic (congestive) heart failure: Secondary | ICD-10-CM | POA: Diagnosis not present

## 2018-02-11 DIAGNOSIS — E785 Hyperlipidemia, unspecified: Secondary | ICD-10-CM | POA: Diagnosis not present

## 2018-02-11 DIAGNOSIS — Z95810 Presence of automatic (implantable) cardiac defibrillator: Secondary | ICD-10-CM

## 2018-02-11 DIAGNOSIS — D508 Other iron deficiency anemias: Secondary | ICD-10-CM

## 2018-02-11 DIAGNOSIS — C61 Malignant neoplasm of prostate: Secondary | ICD-10-CM | POA: Diagnosis not present

## 2018-02-11 DIAGNOSIS — I10 Essential (primary) hypertension: Secondary | ICD-10-CM | POA: Diagnosis not present

## 2018-02-11 DIAGNOSIS — E538 Deficiency of other specified B group vitamins: Secondary | ICD-10-CM | POA: Diagnosis not present

## 2018-02-11 DIAGNOSIS — I447 Left bundle-branch block, unspecified: Secondary | ICD-10-CM

## 2018-02-11 DIAGNOSIS — I255 Ischemic cardiomyopathy: Secondary | ICD-10-CM

## 2018-02-11 LAB — CBC WITH DIFFERENTIAL/PLATELET
BASOS PCT: 0.5 % (ref 0.0–3.0)
Basophils Absolute: 0 10*3/uL (ref 0.0–0.1)
EOS ABS: 0.3 10*3/uL (ref 0.0–0.7)
Eosinophils Relative: 7.3 % — ABNORMAL HIGH (ref 0.0–5.0)
HEMATOCRIT: 27.9 % — AB (ref 39.0–52.0)
Hemoglobin: 10 g/dL — ABNORMAL LOW (ref 13.0–17.0)
LYMPHS PCT: 23.2 % (ref 12.0–46.0)
Lymphs Abs: 1.1 10*3/uL (ref 0.7–4.0)
MCHC: 35.9 g/dL (ref 30.0–36.0)
MCV: 93.1 fl (ref 78.0–100.0)
Monocytes Absolute: 0.5 10*3/uL (ref 0.1–1.0)
Monocytes Relative: 11.7 % (ref 3.0–12.0)
NEUTROS ABS: 2.7 10*3/uL (ref 1.4–7.7)
NEUTROS PCT: 57.3 % (ref 43.0–77.0)
PLATELETS: 238 10*3/uL (ref 150.0–400.0)
RBC: 2.99 Mil/uL — ABNORMAL LOW (ref 4.22–5.81)
RDW: 13.9 % (ref 11.5–15.5)
WBC: 4.7 10*3/uL (ref 4.0–10.5)

## 2018-02-11 LAB — FOLATE: Folate: >24.1 ng/mL (ref >5.9)

## 2018-02-11 LAB — VITAMIN B12: VITAMIN B 12: 315 pg/mL (ref 211–911)

## 2018-02-11 NOTE — Patient Instructions (Signed)
Today we updated your med list in our EPIC system...    Continue your current medications the same...  We reviewed your recent blood work & found that your hemoglobin has dropped to "9" indicating a worsening anemia... We discussed rechecking ANEMIA labs today & collecting the STOOL CARD SPECIMENS to check for hidden blood...    We will call with the results when available & discuss the next step...  Continue your 1-a-day-with-Iron tablet each day for now...  Call for any questions & we will determine f/u visit appt after the anemia eval..Marland Kitchen

## 2018-02-13 LAB — IMMUNOFIXATION ELECTROPHORESIS
IGG (IMMUNOGLOBIN G), SERUM: 855 mg/dL (ref 600–1540)
IGM, SERUM: 15 mg/dL — AB (ref 50–300)
Immunofix Electr Int: NOT DETECTED
Immunoglobulin A: 47 mg/dL (ref 20–320)

## 2018-02-13 LAB — PROTEIN ELECTROPHORESIS, SERUM
ALPHA 2: 0.6 g/dL (ref 0.5–0.9)
Albumin ELP: 4.6 g/dL (ref 3.8–4.8)
Alpha 1: 0.3 g/dL (ref 0.2–0.3)
Beta 2: 0.3 g/dL (ref 0.2–0.5)
Beta Globulin: 0.5 g/dL (ref 0.4–0.6)
GAMMA GLOBULIN: 0.8 g/dL (ref 0.8–1.7)
TOTAL PROTEIN: 7 g/dL (ref 6.1–8.1)

## 2018-02-17 ENCOUNTER — Other Ambulatory Visit: Payer: Medicare Other

## 2018-02-17 ENCOUNTER — Other Ambulatory Visit (INDEPENDENT_AMBULATORY_CARE_PROVIDER_SITE_OTHER): Payer: Medicare Other

## 2018-02-17 DIAGNOSIS — D508 Other iron deficiency anemias: Secondary | ICD-10-CM

## 2018-02-17 LAB — HEMOCCULT SLIDES (X 3 CARDS)
FECAL OCCULT BLD: NEGATIVE
OCCULT 1: NEGATIVE
OCCULT 2: NEGATIVE
OCCULT 3: NEGATIVE
OCCULT 4: NEGATIVE
OCCULT 5: NEGATIVE

## 2018-02-18 LAB — IRON,TIBC AND FERRITIN PANEL
%SAT: 28 % (calc) (ref 20–48)
Ferritin: 150 ng/mL (ref 24–380)
Iron: 89 ug/dL (ref 50–180)
TIBC: 323 mcg/dL (calc) (ref 250–425)

## 2018-03-04 ENCOUNTER — Ambulatory Visit (INDEPENDENT_AMBULATORY_CARE_PROVIDER_SITE_OTHER): Payer: Medicare Other

## 2018-03-04 DIAGNOSIS — E538 Deficiency of other specified B group vitamins: Secondary | ICD-10-CM

## 2018-03-07 ENCOUNTER — Telehealth: Payer: Self-pay | Admitting: Pulmonary Disease

## 2018-03-07 MED ORDER — CYANOCOBALAMIN 1000 MCG/ML IJ SOLN
1000.0000 ug | Freq: Once | INTRAMUSCULAR | Status: AC
  Administered 2018-03-04: 1000 ug via INTRAMUSCULAR

## 2018-03-07 NOTE — Telephone Encounter (Signed)
We reviewed your recent blood work & found that your hemoglobin has dropped to "9" indicating a worsening anemia... We discussed rechecking ANEMIA labs today & collecting the STOOL CARD SPECIMENS to check for hidden blood...    We will call with the results when available & discuss the next step...    Left detailed msg for Freda Munro per her request

## 2018-03-07 NOTE — Telephone Encounter (Signed)
Spoke with the pt's daughter  She is asking for results of hemmocult cards  I advised that SN has not read yet but I they were neg and I went ahead and let her know this  She verbalized understanding and nothing further needed

## 2018-04-04 ENCOUNTER — Ambulatory Visit (INDEPENDENT_AMBULATORY_CARE_PROVIDER_SITE_OTHER): Payer: Medicare Other

## 2018-04-04 DIAGNOSIS — E538 Deficiency of other specified B group vitamins: Secondary | ICD-10-CM

## 2018-04-04 MED ORDER — CYANOCOBALAMIN 1000 MCG/ML IJ SOLN
1000.0000 ug | Freq: Once | INTRAMUSCULAR | Status: AC
  Administered 2018-04-04: 1000 ug via INTRAMUSCULAR

## 2018-04-15 ENCOUNTER — Other Ambulatory Visit: Payer: Self-pay | Admitting: *Deleted

## 2018-04-15 MED ORDER — CLOPIDOGREL BISULFATE 75 MG PO TABS: 75.0000 mg | ORAL_TABLET | Freq: Every day | ORAL | 4 refills | Status: DC

## 2018-04-17 ENCOUNTER — Ambulatory Visit (INDEPENDENT_AMBULATORY_CARE_PROVIDER_SITE_OTHER): Payer: Medicare Other | Admitting: *Deleted

## 2018-04-17 DIAGNOSIS — I255 Ischemic cardiomyopathy: Secondary | ICD-10-CM

## 2018-04-17 NOTE — Progress Notes (Signed)
Remote ICD transmission.   

## 2018-05-05 ENCOUNTER — Ambulatory Visit (INDEPENDENT_AMBULATORY_CARE_PROVIDER_SITE_OTHER): Payer: Medicare Other

## 2018-05-05 DIAGNOSIS — E538 Deficiency of other specified B group vitamins: Secondary | ICD-10-CM

## 2018-05-05 DIAGNOSIS — Z23 Encounter for immunization: Secondary | ICD-10-CM

## 2018-05-05 MED ORDER — CYANOCOBALAMIN 1000 MCG/ML IJ SOLN
1000.0000 ug | Freq: Once | INTRAMUSCULAR | Status: AC
  Administered 2018-05-05: 1000 ug via INTRAMUSCULAR

## 2018-05-07 LAB — CUP PACEART REMOTE DEVICE CHECK
Date Time Interrogation Session: 20191002100435
Implantable Lead Implant Date: 20050509
Implantable Lead Location: 753860
Implantable Lead Model: 1580
MDC IDC LEAD IMPLANT DT: 20050509
MDC IDC LEAD LOCATION: 753859
MDC IDC PG IMPLANT DT: 20110609
MDC IDC PG SERIAL: 610170

## 2018-05-15 ENCOUNTER — Encounter: Payer: Self-pay | Admitting: *Deleted

## 2018-05-21 ENCOUNTER — Telehealth: Payer: Self-pay | Admitting: Pulmonary Disease

## 2018-05-21 NOTE — Telephone Encounter (Signed)
Spoke with pt's daughter Freda Munro (dpr on file), requesting SN's recs for a new pcp for pt.   Freda Munro states it's ok to leave a detailed VM on cell phone # if she does not answer with recommendations.  SN please advise on PCP recs for pt.  Thanks!

## 2018-05-22 NOTE — Telephone Encounter (Signed)
Per SN- we can't set up appointments.  He would recommend family's physician, local PCP clinic near home, or go [facility] website.   Called and spoke with Patient's Daughter, Freda Munro.  SN recommendations given.  Freda Munro stated understanding.  Nothing further at this time.

## 2018-05-26 ENCOUNTER — Telehealth: Payer: Self-pay | Admitting: Internal Medicine

## 2018-05-26 ENCOUNTER — Encounter: Payer: Self-pay | Admitting: Pulmonary Disease

## 2018-05-26 NOTE — Telephone Encounter (Signed)
Ok with me 

## 2018-05-26 NOTE — Telephone Encounter (Signed)
Copied from [place] [phone]. Topic: Appointment Scheduling - Transfer of Care >> May 26, 2018  4:24 PM [name] wrote: Pt is requesting to transfer FROM: [name]  Pt is requesting to transfer TO: [name]  Reason for requested transfer:  Pt would like to know if Dr [name] would be willing to take him on as Blevins new pt.  Requesting Dr [name] only.  Please advise  Azucena Cecil -[phone]   Send CRM to patient's current PCP (transferring FROM).  Dr.John to you approve? Provider will be out of office until 11/4.

## 2018-05-26 NOTE — Progress Notes (Signed)
Subjective:    Patient ID: [name], male    DOB: Mar 03, 1932, 84 y.o.   MRN: [id number]  HPI 84 y/o WM here for a follow up visit & review of mult medical problems... 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 ~  SEE PREV EPIC NOTES FOR OLDER DATA >>     2DEcho 10/11 showed EF=35-40% & mild HK  LABS 1/15:  FLP- ok on Simva40+Lopid600;  Chems- wnl;  CBC- ok w/ Hg=12.6;  TSH=3.78;  PSA=3.47...  LABS 6/15 & 7/15 showed> FLP- fair w/ LDL=104;  Chems- wnl;  CBC- ok x WBC=16.9 w/ 57%eos; f/u CBC 8/15 showed Hg=11.6, WBC=6.9 w/ 17%eos...  CXR 8/15 showed norm heart size, Pacer/ICD stable, stent noted, lungs clear, NAD...   ~   [id number]:  [name] was injured at work (fell pulling yarn out of a machine) & sustained fx rib, went to ER 08/14/14, note reviewed, XRay w/ left anterolat 6th rib fx, treated w/ rest/ heat/ Tramadol/ Tylenol/ etc...     CXR 08/14/14 showed norm heart size, Pacer/AICD on left, clear lungs w/ mild peribronch thickening, mult old rib fxs on left, NAD...   LABS 1/16:  FLP looks good on Simva40/Lopid600; Chems- wnl;  CBC- wnl;  TSH=5.40;  PSA=5.27...    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 We reviewed the following medical problems during today's office visit >>     HBP> on MetopER100AM, Altace10PM;  BP= 130/78 & he denies CP, palpit, dizzy, SOB, edema; states feeling well & energy is good...    CAD, Ischemic cardiomyop EF=35-40%, chr sys heart failure, AS & MR> on ASA/ Plavix; followed by Benay Spice & seen 7/16 (note reviewed); he's had prev MI & mult interventions, last 2DEcho 10/11 showed EF=35-40% & mild HK;  Repeat 2DEcho 7/16 showed decr LVF w/ EF=40-45%, AK of apex & septum, Gr1DD, paradox septal  motion, AoV sclerosis &mild AI, mild MV leaflet thickening (no change in meds).    Hx Syncope, AV block, Implanted Defib> followed by DrTaylor (seen 2/16- note reviewed) w/ his StJude biventricICD working normally) w/ pacer checks every 2-15mo he says; generator changed 6/11; he denies CP/angina, dizzy/ syncope.     Chol> on Simva40, Lopid600, FishOil; FLP 1/16 shows TChol 148, TG 111, HDL 45, LDL 81... No changes made, we stressed diet...    GI- GERD, constip> he had LapChole 2006; hx chr constip & anal stenosis- had eval by DrPatterson; cannot find reference to prev colonoscopy in Epic or [facility] but he says neg colon 2005 by DrPatterson; Rectal exam showed anal stenosis- dilated via anoscopy; he was treated w/ Linzess & Chronulac & AnusolHC and improved (now just using Miralax)...     GU- elev PSA> He saw DrHerrick & now follows w/ him for elev PSA- they are approaching the prob conservatively w/ PSA Q38mo & consider hormone therapy if it continues to rise...    DJD> he just uses Tylenol as needed...    VitB12 defic> on B12 shots monthly... We reviewed prob list, meds, xrays and labs> see below for updates >>   EKG 02/11/15 showed SBrady, rate50, AV sequential pacing...   2DEcho 7/16 showed decr LVF w/  EF=40-45%, AK of apex & septum, Gr1DD, paradox septal motion, AoV sclerosis &mild AI, mild MV leaflet thickening (no change in meds).  LABS 8/16:  Chems- wnl;  CBC- wnl;  TSH=4.07   ADDENDUM>  He saw KW 08/24/15 w/ sore throat & exam c/w candida/thrush- strep swab neg, treated w/ MMW & Diflucan, resolved...   ~  September 14, 2015:  47mo [name] reports doing well- still working 5d/wk for [employer];  We reviewed the following medical problems during today's office visit >>     HBP> on MetopER100AM, Altace10PM;  BP= 140/68 & he denies CP, palpit, dizzy, SOB, edema; states feeling well & energy is good...    CAD w/ MI & interventions, Ischemic cardiomyop EF=35-40%, chr sys heart  failure, AS & MR> on ASA/ Plavix; followed by DrKatz/Taylor & seen 7/16 (note reviewed); he's had prev MI & mult interventions, 2DEcho 10/11 showed EF=35-40% & mild HK;  Repeat 2DEcho 7/16 showed decr LVF w/ EF=40-45%, AK of apex & septum, Gr1DD, paradox septal motion, AoV sclerosis &mild AI, mild MV leaflet thickening (no change in meds).    Hx Syncope, AV block, Implanted Defib> followed by DrTaylor (seen 2/16- note reviewed) w/ his StJude biventricICD working normally) w/ pacer checks every 2-69mo he says; generator changed 6/11; he denies CP/angina, dizzy/ syncope.     Chol> on Simva40, Lopid600, FishOil; FLP 2/17 shows TChol 148, TG 55, HDL 53, LDL 85... No changes made, we stressed diet...    GI- GERD, constip> he had LapChole 2006; hx chr constip & anal stenosis- had eval by DrPatterson; Colonoscopy 2005 by DrPatterson was neg/ wnl; Rectal exam showed anal stenosis- dilated via anoscopy; he was treated w/ Linzess & Chronulac & AnusolHC and improved (now just using Miralax)...     GU- elev PSA> He saw DrHerrick & now follows w/ him for elev PSA- they are approaching the prob conservatively w/ PSA Q3-64mo & consider Bx & hormone therapy if it continues to rise; last seen 12/16- PSA =9.45, note reviewed...    DJD> he just uses Tylenol as needed...    VitB12 defic> on B12 shots monthly... EXAM shows Afeb, VSS, O2sat=98% on RA;  HEENT- neg, mallampati1;  Chest- clear w/o w/r/r;  Heart- RR w/o gr1/6SEM w/o r/g;  Abd- soft, neg;  Ext- +arthritic changes, w/o c/c/e;  Neuro- intact...  LABS 09/09/15> FLP- all parameters at goals;  Chem- wnl;  CBC- ok w/ Hg=12.0, MCV=94;  TSH-3.64;  PSA=12.82 & pt is encouraged to f/u w/ Urology- [place] ASAP... IMP/PLAN>>  [name] is clinically stable, still working 5d/wk, no new complaints or concerns;  He has HBP, CAD, cardiomyop w/ EF 40-45% & ICD in place- followed by DrTaylor;  He has a rising PSA followed by DrHerrick & given a copy of labs to take to Urology... Same  meds/ ROV 31mo...  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  We reviewed the following medical problems during today's office visit >>     HBP> on MetopER100AM, Altace10PM;  BP= 114/68 & he denies CP, palpit, dizzy, SOB, edema; states feeling well & energy is good...    CAD w/ MI & interventions, Ischemic cardiomyop decr EF, chr sys heart failure, AS & MR> on ASA/ Plavix; followed by DrMcAlhany/Taylor & seen 8/17 (as above); he's had prev MI & mult interventions, 2DEcho 10/11 showed EF=35-40% & mild HK;  Repeat 2DEcho 7/16 showed decr LVF w/ EF=40-45%, AK of apex & septum, Gr1DD, paradox septal motion, AoV sclerosis &mild AI, mild MV leaflet thickening; Stable- no change in meds.    Hx Syncope, AV block, Implanted Defib> followed by DrTaylor (seen 2/17- note reviewed) w/ his StJude biventricICD working normally) w/ pacer checks every 2-68mo he says; generator changed 6/11; he denies CP/angina, dizzy/ syncope, etc...    Chol> on Simva40, Lopid600, FishOil; FLP 2/17 shows TChol 148, TG 55, HDL 53, LDL 85... No changes made, we stressed diet...    GI- GERD, constip> he had LapChole 2006; hx chr constip & anal stenosis- had eval by DrPatterson; Colonoscopy 2005 by DrPatterson was neg/ wnl; Rectal exam showed anal stenosis- dilated via anoscopy; he was treated w/ Linzess & Chronulac & AnusolHC and improved (now just using  Miralax)...     GU- elev PSA> He sees DrHerrick for elev PSA- they are approaching this conservatively w/ PSA Q3-62mo & consider Bx & hormone therapy if it continues to rise; PSA 09/2015 was 12.8 & pt declined Bx (didn't want to consider XRT), they discussed options & are continuing to monitor (see note of 10/21/15- reviewed)... PSA 8/17= 12.25    DJD> he just uses Tylenol as needed...    VitB12 defic> on B12 shots monthly... EXAM shows Afeb, VSS, O2sat=98% on RA;  HEENT- neg, mallampati1;  Chest- clear w/o w/r/r;  Heart- RR w/o gr1/6SEM w/o r/g;  Abd- soft, neg;  Ext- +arthritic changes, w/o c/c/e;  Neuro- intact...  LABS 03/2016>  Chems- wnl;  CBC- new mild anemia w/ Hg=10.4, mcv=93;  Repeat CBC showed Hg=12.3,  Fe=116 (29%sat),  B12=329,  SPE/IEP- no monoclonal prot;  stool card was pos for hidden blood... IMP/PLAN>>  [name] has mulitsys disease but is still going strong- CAD, cardiomyop, ICD etc; plus prostate cancer on watchful waiting from [place]; new prob is mild anemia, Fe/ B12/ SPE are ok but stool pos for occult blood; he has hx chr constip & anal stenosis, on Miralax, prev followed by DrPatterson- needs GI eval for +stool & we will refer...   ~  September 13, 2016:  57mo ROV & [name] notes that DrPerry started him on Omeprazole but he is scared to take it because he is on Plavix as well- we discussed the potential interaction & the controversy regarding lack of any clinically observed problems, the current recommendations however favor the use of Pantoprazole40 in pt's on Plavix... We reviewed the following medical problems during today's office visit >>     HBP> on MetopER100AM, Altace10PM;  BP= 140/80 & he denies CP, palpit, dizzy, SOB, edema; states feeling well & energy is good...    CAD w/ MI & interventions, Ischemic cardiomyop decr EF, chr sys heart failure, AS & MR> on ASA81/ Plavix75; followed by DrMcAlhany/Taylor & seen 8/17 (as above); he's had prev MI & mult interventions, 2DEcho 10/11  showed EF=35-40% & mild HK;  Repeat 2DEcho 7/16  showed decr LVF w/ EF=40-45%, AK of apex & septum, Gr1DD, paradox septal motion, AoV sclerosis &mild AI, mild MV leaflet thickening; Stable- no change in meds.    Hx Syncope, AV block, Implanted Defib> followed by DrTaylor (seen 2/17- note reviewed) w/ his StJude biventricICD working normally w/ pacer checks every 2-1mo he says; generator changed 6/11; he denies CP/angina, dizzy/ syncope, etc...    Chol> on Simva40, Lopid600, FishOil; FLP 2/18 shows TChol 147, TG 173, HDL 43, LDL 69... No changes made, we stressed diet...    GI- GERD, constip> he had LapChole 2006; hx chr constip & anal stenosis- had eval by DrPatterson; Colonoscopy 2005 was neg/ wnl; Rectal exam showed anal stenosis- dilated via anoscopy; he was treated w/ Linzess & Chronulac & AnusolHC=> improved (now just using Miralax);  He saw DrPerry in 2015 & again 05/16/16- anemia, heme-pos stools, on ASA/Plavix, chr constip, anal stenosis, chr abd discomfort; he was felt to be too high risk for endoscopic procedures?  Placed on [place], but Protonix40 would be a better option for pt on Plavix rx...    GU- elev PSA> He sees DrHerrick for elev PSA- they are approaching this conservatively w/ PSA Q3-20mo & consider Bx & hormone therapy if it continues to rise; PSA 09/2015 was 12.8 & pt declined Bx (didn't want to consider XRT), they discussed options & are continuing to monitor (see note of 10/21/15- reviewed)... PSA 8/17= 12.25 & they continue watchful waiting protocol...    DJD> he just uses Tylenol as needed...    VitB12 defic> on B12 shots monthly & Vit B12 level 2/18 = 240, continue 1069mcg IM Qmonth... EXAM shows Afeb, VSS, O2sat=98% on RA;  HEENT- neg, mallampati1;  Chest- clear w/o w/r/r;  Heart- RR w/o gr1/6SEM w/o r/g;  Abd- soft, neg;  Ext- +arthritic changes, w/o c/c/e;  Neuro- intact...  CXR 09/13/16 (independently reviewed by me in the PACS system) shows norm heart size & atherosclerosis of Ao,  clear lungs w/ ICD on left, NAD...  LABS 09/17/16>  FLP- Chol ok but TG=173 on diet/ Simva40/ Lopid600/ Fish Oil;  Chems- wnl;  CBC- ok x Hg=12.0, eos=5%;  TSH=4.93;  B12=240... IMP/PLAN>>  [name] has finally retired!  Refills provided for 90d supplies per his request- change to Protonix40...  ~  March 13, 2017:  26mo ROV & [name] has started on IMRT for his prostate cancer per DrHerrick & DrManning; he notes that it's "getting rough w/ some GI side effects; he's feeling weaker but denies CP, palpit, change in SOB, or edema; he has not been very active & can't do much exercise at this point;  We discussed checking CBC & thyroid w/ his complaints of weakness... We reviewed the following medical problems during today's office visit >>     HBP> on MetopER100AM, Altace10PM;  BP= 126/60 & he denies CP, palpit, dizzy, SOB, edema...    CAD w/ MI & interventions, Ischemic cardiomyop decr EF, chr sys heart failure, AS & MR> on ASA81/ Plavix75 + above; followed by DrMcAlhany/Taylor & last seen 3/18; he's had prev MI & mult interventions, 2DEcho 10/11 showed EF=35-40% & mild HK;  Repeat 2DEcho 7/16 showed decr LVF w/ EF=40-45%, AK of apex & septum, Gr1DD, paradox septal motion, AoV sclerosis &mild AI, mild MV leaflet thickening; Pt stable- no change in meds.    Hx Syncope, complete AV block, Implanted Defib> followed by DrTaylor (seen 2/17 & 3/18- notes reviewed) w/ his StJude biventricICD working normally w/ pacer checks every 2-72mo he  says; generator changed 6/11; he denies CP/angina, dizzy/ syncope, etc...    Chol> on Simva40, Lopid600, FishOil; FLP 2/18 shows TChol 147, TG 173, HDL 43, LDL 69... No changes made, we stressed diet...    Hypothyroid>  Labs 03/2017 showed TSH= 7.10 & we started Levothyroid50...    GI- GERD, constip> he had LapChole 2006; hx chr constip & anal stenosis- had eval by DrPatterson; Colonoscopy 2005 was neg/ wnl; Rectal exam showed anal stenosis- dilated via anoscopy; he was treated w/  Linzess & Chronulac & AnusolHC=> improved (now just using Miralax);  He saw DrPerry in 2015 & again 05/16/16- anemia, heme-pos stools, on ASA/Plavix, chr constip, anal stenosis, chr abd discomfort; he was felt to be too high risk for endoscopic procedures?  Placed on [place], but Protonix40 would be a better option for pt on Plavix rx...    GU- elev PSA> He sees DrHerrick for elev PSA- they are approaching this conservatively w/ PSA Q3-67mo & consider Bx & hormone therapy if it continues to rise; PSA 03/2016 was ~12 & pt declined Bx (didn't want to consider XRT), they discussed options & are continued to monitor=> his PSA 3/18 up to 27 & Bx 4/18 showed 4+4=8 and they decided on IMRT from [facility] (also plan 2 yrs of androgen deprivation therapy)- started 02/2017;  Bone scan neg- no mets seen.    DJD> he just uses Tylenol as needed...    Anemia & VitB12 defic> on B12 shots monthly & Vit B12 level 2/18 = 240, continue 103mcg IM Qmonth;  Labs 03/2017 showed Hg=10.0, mcv=92 & rec to take MVI w/ Fe... EXAM shows Afeb, VSS, O2sat=98% on RA;  HEENT- neg, mallampati1;  Chest- clear w/o w/r/r;  Heart- RR w/o gr1/6SEM w/o r/g;  Abd- soft, neg;  Ext- +arthritic changes, w/o c/c/e;  Neuro- intact...  LABS 03/07/17>  Chems- wnl;  CBC- Hg=10.0, mcv=92;  TSH=7.10... IMP/PLAN>>  [name] is well into his IMRT for the prostate cancer, starting to experience some side effects, feeling weak- labs show TSH=7.10 & we are starting Levothy20mcg/d, and his Hg=10 rec to start an MVI w/ Fe; he will continue the IMRT & planned androgen depriv therapy per Urology & RadOnc...  ~  May 13, 2017:  46mo ROV & add-on requested for dizziness> [name] notes that his home BP checks have been low ave ~100/60 w/ pulse in the 50s & assoc w/ some dizziness but denies CP/ palpit/ SOB/ or edema;  He decreased his toprolXL100 to 1/2 tab daily for the last several days & feels better he says...     HBP, CAD w/ MI & interventions, ischemic cardiomyopathyw/  decr EF, chr sys CHF, AS & MR>  On ASA/PLAVIX, ToprolXL100-taking 1/2 daily, Altace10    Hx syncope, AVB, implanted defib> ICD followed by DrTaylor...    Chol> on Simva40, Lopid600, and Fish Oil;  He tells me [facility] can't get the Lopid anymore=> we decided to stop & recheck FLP later...     Hypothy> on Synthroid50 since 03/2017 when routine TSH returned 7.10... Recheck today & improved... EXAM shows Afeb, VSS- BP=122/62 & pulse=54/reg, O2sat=99% on RA;  HEENT- neg, mallampati1, soft bilat CBruits;  Chest- clear w/o w/r/r;  Heart- RR w/o gr1-2/6SEM w/o r/g;  Abd- soft, neg;  Ext- +arthritic changes, w/o c/c/e;  Neuro- intact...  LAB 05/13/17>  TSH on Levothy50 = 0.97 (continue same dose)... IMP/PLAN>>  OK to continue ToprolXL50 & OK to put the Lopid on hold- stressing diet & continue the Simva40 +  fish Oil supplement;  He will continue the synthroid50 as well & we plan ROv recehck in 19mo...  ~  August 13, 2017:  70mo ROV & general medical follow up visit> [name] returns for a 75mo ROV & is feeling fine he says; he has completed his IMRT for prostate cancer & is getting Lupron shots from [place];  His BP, Lipids, Thyroid, and B12 all remain stable on meds...  We reviewed the following interval Epic-EMR notes available to us>      He finished his IMRT for prostate cancer in early Sept2018, and continues on Lupron injections from [place]...     Last CARDS appt DrMcAlhany was [id number]>  Hx HBP, CAD- s/p stenting, chr sysCHF, ischemic cardiomyopathy w/ ICD implanted 2005, generator 2011, HL; 2DEcho 02/2015 w/ EF=40-45%, mild AI, triv MR; he is relatively asymptomatic & exam is normal; REC to continue on same meds: MetopER100- only taking 1/2, Altace10, ASA81/ Plavis75, Simva40/ Lopid600,     He continues to get B12 injections Qmonth...    He saw UROLOGY- [place] 06/24/17>  Prostate ca Dx 2014 & followed, then 02/2017 treated w/ external beam radiation & hormone therapy; pretreatment PSA=26 & Gleason score  4+4=8; pt notes energy improved, c/o urin freq but only nocturia x1, his f/u PSA was reportedly zero... We reviewed the following medical problems during today's office visit>      HBP> on MetopER100- taking 1/2AM, Altace10PM;  BP= 142/72 & he denies CP, palpit, dizzy, SOB, edema...    CAD w/ MI & interventions, Ischemic cardiomyop decr EF, chr sys heart failure, AS & MR> on ASA81/ Plavix75 + above; followed by DrMcAlhany/Taylor & last seen 10/18; 2DEcho 10/11 showed EF=35-40% & mild HK;  Repeat 2DEcho 7/16 showed decr LVF w/ EF=40-45%, AK of apex & septum, Gr1DD, paradox septal motion, AoV sclerosis &mild AI, mild MV leaflet thickening; Pt stable- no change in meds.    Hx Syncope, complete AV block, Implanted Defib> followed by DrTaylor (seen 2/17 & 3/18- notes reviewed) w/ his StJude biventricICD working normally w/ pacer checks every 2-43mo he says; generator changed 6/11; he denies CP/angina, dizzy/ syncope, etc...    Chol> on Simva40, Lopid600, FishOil; FLP 1/19 shows TChol 179, TG 181, HDL 45, LDL 99... No changes made, we stressed diet...    Hypothyroid>  Labs 03/2017 showed TSH= 7.10 & we started Levothyroid50;  Follow up Labs 1/19 showed TSH=2.97, continue same...    GI- GERD, constip> he had LapChole 2006; hx chr constip & anal stenosis- had eval by DrPatterson; Colonoscopy 2005 was neg/ wnl; Rectal exam showed anal stenosis- dilated via anoscopy; he was treated w/ Linzess & Chronulac & AnusolHC=> improved (now just using Miralax);  He saw DrPerry in 2015 & again 05/16/16- anemia, heme-pos stools, on ASA/Plavix, chr constip, anal stenosis, chr abd discomfort; he was felt to be too high risk for endoscopic procedures?  Placed on [place], but Protonix40 would be a better option for pt on Plavix rx...    GU- elev PSA> He saw DrHerrick for elev PSA- they approached this conservatively w/ PSA Q3-66mo & consider Bx & hormone therapy if it continues to rise; PSA 03/2016 was ~12 & pt declined Bx (didn't want to  consider XRT), they discussed options & are continued to monitor=> his PSA 3/18 up to 27 & Bx 4/18 showed 4+4=8 and they decided on IMRT from DrManning (+2 yrs of androgen deprivation therapy)- started 02/2017;  Bone scan neg- no mets seen=> IMRT finished 04/2017 & he continues  f/u w/ DrHerrick & Lupron Q73mo=> PSA has been zero.    DJD> he just uses Tylenol as needed...    Anemia & VitB12 defic> on B12 shots monthly & Vit B12 level 2/18 = 240, continue 1070mcg IM Qmonth;  Labs 03/2017 showed Hg=10.0, mcv=92 & rec to take MVI w/ Fe; f/u Labs 1/19 showed Hg=11.1, B12=297... EXAM shows Afeb, VSS- BP=142/72 & pulse=50/reg, O2sat=100% on RA;  HEENT- neg, mallampati1, soft bilat CBruits;  Chest- clear w/o w/r/r;  Heart- RR w/o gr1-2/6SEM w/o r/g;  Abd- soft, neg;  Ext- +arthritic changes, w/o c/c/e;  Neuro- intact...  LABS 08/13/17>  FLP- ok on simva40+Lopid600 x TG=181 & we reviewed diet;  Chems- ok w/ K=5.2, BS=110;  CBC- Hg=11.1, WBC=6.3;  TSH=2.97;  B12=297 IMP/PLAN>>  BP stable- continue same meds + no salt;  Cardiac stable monitored by [facility]- continue same;  Chol & Thyroid are OK- continue same meds, discussed how to take them, continue diet + exercise;  DrHerrick continues to follow Prostate- s/p IMRT, on Lupron;  He remains on B12 shots as well... REC to continue same, call for any issues, we will recheck in 26mo...   ~  February 12, 2018:  70mo ROV               Problem List:  HYPERTENSION (ICD-401.9) - controlled on TOPROL XL 100mg /d & ALTACE 10mg /d... takes meds regularly and tolerates well...  ~  7/12: BP= 120/68 and OK at home per pt; he gets rare HA which he believes is related to his BP; denies fatigue, visual changes, CP, palipit, dizziness, syncope, dyspnea, edema, etc... ~  CXR 9/12 showed dual lead pacer/ AICD, norm heart size, clear lungs, NAD... ~  2/13:  BP= 124/68 & he remains asymptomatic... ~  8/13:  BP= 122/58 & he denies CP, palpit, SOB, edema, etc... ~  2/14: on MetopER100,  Altace10;  BP= 148/60 & he denies CP, palpit, dizzy, SOB, edema. ~  8/14: on MetopER100 in AM, Altace10 in PM;  BP= 120/70 & he denies CP, palpit, dizzy, SOB, edema. ~  2/15: on MetopER100 & Altace10; BP= 136/64 & he remains asymptomatic... ~  8/15: on MetopER100AM, Altace10PM;  BP= 100/60 & he denies CP, palpit, dizzy, SOB, edema; states feeling well & energy is good ~  CXR 8/15 showed norm heart size, Pacer/ICD stable, stent noted, lungs clear, NAD. ~  2/16:  He remains on MetopER100 & Altace10; BP= 138/80 & he denies any current CP, palpit, SOB, edema.  CAD (ICD-414.00) - on ASA 81mg /d & PLAVIX 75mg /d> followed by Benay Spice... ISCHEMIC CARDIOMYOPATHY (ICD-414.8) - he's had prev MI w/ mult caths/ interventions. CHRONIC SYSTOLIC HEART FAILURE (FAO-130.86) AORTIC STENOSIS (ICD-424.1) > Mild by 2DEcho 10/11 MITRAL REGURGITATION (ICD-396.3) ~  last cath 5/08 showed norm L[place], patent stent in prox LAD, 80-90% lesion in mid-LAD w/ PCI & stent placed, & 50% ostial stenosis of 1st diagonal branch of LAD; 40%mid & 70%distal CIRC; 50%prox 40%mid 40%distal RCA; Global HK w/EF=30%. ~  NuclearStressTest 4/09 showed infer scar, no ischemia, EF=40%. ~  2DEcho 10/11 showed mild LVH, decr LVF w/ EF= 35-40% & mild HK, gr1DD, mild AS & MR, mild LAdil. ~  EKG 10/12 showed pacer rhythm... 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  Hx of SYNCOPE (ICD-780.2) AV BLOCK, COMPLETE (ICD-426.0) AUTOMATIC IMPLANTABLE CARDIAC DEFIBRILLATOR SITU (ICD-V45.02) ~  Pacer/ ICD per DrTaylor w/  several adjustments in 2008... ~  5/11:  f/u DrTaylor w/ decision for elective generator change> done 6/11 & tol well. ~  He continues to f/u w/ DrTaylor every 6 months... ~  2/13: f/u DrTaylor doing satis, device working normally, no changes made & f/u 84yr... ~  2/14: Hx Syncope, AV block, Implanted Defib> followed by DrTaylor w/ pacer checks every 2-36mo he says; generator changed 6/11; he denies CP/angina, dizzy/ syncope, etc. ~  2/15: he had f/u DrTaylor> stable & doing satis...  HYPERCHOLESTEROLEMIA (ICD-272.0) - takes SIMVASTATIN 40mg /d, GEMFIBRIZOL 600mg /d, & FISH OIL Bid... ~  [name] 08/14/07 on Simva40 showed TChol 163, TG 94, HDL 34, LDL 111... rec incr Simvastatin to 80mg /d. ~  FLP 1/10 showed TChol 125, TG 67, HDL 32, LDL 80... rec- continue same. ~  FLP 1/11 on Simva80+Gemfib600 showed TChol 137, TG 99, HDL 39, LDL 78 ~  FLP 1/12 on Simva40+Gemfib600 showed TChol 157, TG 139, HDL 38, LDL 92 ~  FLP 1/13 on Simva40+Gemfib600 showed TChol 149, TG 50, HDL 39, LDL 100 ~  FLP 1/14 on Simva40+Gemfib600 showed TChol 152, TG 74, HDL 38, LDL 99  ~  FLP 1/15 on Simva40+Gemfib600 showed TChol 159, TG 64, HDL 43, LDL 103 ~  FLP 6/15 on Simva40+Gemfib600 showed TChol 168, TG 117, HDL 41, LDL 104  ~  FLP 1/16 on Simva40+Gemfib600 showed TChol 148, TG 111, HDL 45, LDL 81  BORDERLINE TSH >> ~  Labs 1/15 showed TSH= 3.78 ~  Labs 1/16 showed an elev TSH for the 1st time w/ TSH= 5.40, he is clinically euthyroid.  GERD (ICD-530.81) - he had ERCP in 8/06 w/ norm ducts, +gallstones, s/p lap chole 2006... he uses OTC meds Prn... ~  he notes that he had a colonoscopy in the past from DrPatterson, & he reports that it was neg. ~  7/15> GI eval from DrPerry, he didn't comment on the eosinophilia...  ELEVATED PSA >> ~  Labs 1/16 showed an elev PSA for the 1st time w/ PSA= 5.27  DEGENERATIVE JOINT DISEASE (ICD-715.90) - he tried Mobic but it bothered his stomach, therefore uses Tylenol Prn.  VITAMIN B12  DEFICIENCY (ICD-266.2) - old chart not available- ? details of this Dx... on B12 shots xyrs. ~  labs 1/09 showed Hg=12.9 ~  labs 1/10 showed Hg= 12.7 ~  labs 1/11 showed Hg= 12.6 ~  labs 1/12 showed Hg= 13.2 ~  Labs 1/13 showed Hg= 12.6 ~  Labs 1/14 showed Hg= 12.7 ~  Labs 1/15 showed Hg= 12.6, WBC= 5.7 w/ 10%eos ~  Labs 7/15 showed Hg= 11.7, WBC= 16.9 w/ 57%eos... ~  Labs 8/15 showed Hg= 11.6, WBC= 6.9, w/ 17%eos... ~  Labs 1/16 showed Hg= 12.7, WBC= 4.4 w/ 9% eos  Health Maintenance: ~  GI:  Followed by DrPatterson ~  GU:  PSA checked yearly & remains wnl... ~  Immunizations:  he gets the yearly Flu vaccine;  [place] says;  given Pneumovax 1/11 age 6.   Past Surgical History:  Procedure Laterality Date  . CARDIAC  CATHETERIZATION  2008  . icd implanted  2005  . LAPAROSCOPIC CHOLECYSTECTOMY  04/2005   Dr. Lucia Gaskins  . ROTATOR CUFF REPAIR      Outpatient Encounter Medications as of 02/11/2018  Medication Sig  . aspirin 81 MG tablet Take 81 mg by mouth daily.    . Calcium Carbonate-Vit D-Min (CALCIUM 1200 PO) Take 1 capsule by mouth daily.  . cyanocobalamin (,VITAMIN B-12,) 1000 MCG/ML injection INJECT 1 ML INTO THE MUSCLE EVERY 30 DAYS  . fish oil-omega-3 fatty acids 1000 MG capsule Take 2 g by mouth daily.    Marland Kitchen gemfibrozil (LOPID) 600 MG tablet Take 1 tablet (600 mg total) by mouth daily.  Marland Kitchen levothyroxine (SYNTHROID) 50 MCG tablet Take 1 tablet (50 mcg total) by mouth daily before breakfast.  . metoprolol succinate (TOPROL-XL) 50 MG 24 hr tablet Take 1 tablet (50 mg total) by mouth daily. Take with or immediately following a meal.  . Multiple Vitamins-Minerals (MENS MULTIVITAMIN PLUS PO) Take 1 tablet by mouth daily.    Marland Kitchen omeprazole (PRILOSEC) 20 MG capsule Take 1 capsule (20 mg total) by mouth daily. Take 30 minutes before a meal  . Polyethylene Glycol POWD Take 1 scoop by mouth daily.   . ramipril (ALTACE) 10 MG capsule Take 1 capsule (10 mg  total) by mouth daily.  . simvastatin (ZOCOR) 40 MG tablet Take 1 tablet (40 mg total) by mouth at bedtime.  . [DISCONTINUED] clopidogrel (PLAVIX) 75 MG tablet Take 1 tablet (75 mg total) by mouth daily.   No facility-administered encounter medications on file as of 02/11/2018.     No Known Allergies    Immunization History  Administered Date(s) Administered  . H1N1 08/04/2008  . Influenza Split 05/10/2012, 05/11/2013  . Influenza Whole 05/04/2008, 06/21/2009, 05/08/2010  . Influenza, High Dose Seasonal PF 05/01/2017, 05/05/2018  . Influenza,inj,Quad PF,6+ Mos 04/09/2014, 05/07/2016  . Influenza-Unspecified 05/07/2015  . Pneumococcal Conjugate-13 09/09/2014  . Pneumococcal Polysaccharide-23 05/17/2000, 08/30/2009    Current Medications, Allergies, Past Medical History, Past Surgical History, Family History, and Social History were reviewed in [employer] record.    Review of Systems        See HPI - all other systems neg except as noted...  The patient complains of dyspnea on exertion.  The patient denies anorexia, fever, weight loss, weight gain, vision loss, decreased hearing, hoarseness, chest pain, syncope, peripheral edema, prolonged cough, headaches, hemoptysis, abdominal pain, melena, hematochezia, severe indigestion/heartburn, hematuria, incontinence, muscle weakness, suspicious skin lesions, transient blindness, difficulty walking, depression, unusual weight change, abnormal bleeding, enlarged lymph nodes, and angioedema.     Objective:   Physical Exam     WD, WN, 84 y/o WM in NAD... wt=163# ht=6'1" BMI=21... GENERAL:  Alert & oriented; pleasant & cooperative... HEENT:  [place]/AT, EOM-wnl, PERRLA, EACs-clear, TMs-wnl, NOSE-clear, THROAT-clear & wnl. NECK:  Supple w/ fairROM; no JVD; normal carotid impulses w/o bruits; no thyromegaly or nodules palpated; no lymphadenopathy. CHEST:  Clear to P & A; without wheezes/ rales/ or rhonchi heard... HEART:   Pacer in L upper chest area~ nontender, RR, gr 1/6 SEM without rubs or gallops detected... ABDOMEN:  Soft & nontender; normal bowel sounds; no organomegaly or masses palpated... EXT: without deformities, mild arthritic changes; no varicose veins/ venous insuffic/ or edema. NEURO:  CN's intact; no focal neuro deficits apprec... DERM:  No lesions noted; no rash etc...  RADIOLOGY DATA:  Reviewed in the EPIC EMR & discussed w/ the patient...   LABORATORY DATA:  Reviewed  in the [facility] EMR & discussed w/ the patient...     Assessment & Plan:    05/13/17>   OK to continue ToprolXL50 & OK to put the Lopid on hold- stressing diet & continue the Simva40 + fish Oil supplement;  He will continue the synthroid50 as well & we plan ROv recehck in 35mo 08/13/17>   BP stable- continue same meds + no salt;  Cardiac stable monitored by [facility]- continue same;  Chol & Thyroid are OK- continue same meds, discussed how to take them, continue diet + exercise;  DrHerrick continues to follow Prostate- s/p IMRT, on Lupron;  He remains on B12 shots as well... REC to continue same, call for any issues, we will recheck in 9mo...   HBP>  Controlled on Metoprolol & Ramipril, continue same...  CAD, Cardiomyopathy, CHF, ValvHeartDis w/ AS & MR>  Followed by DrMcAlhany/ [name] & stable on ASA/ Plavix, he has retired from work as a Museum/gallery conservator at [employer]...  Hx syncope, AVBlock, Pacer/ICD>  Followed by DrTaylor & stable, continue same...  LIPIDS>  FLP looks OK on Simva40, Gemfib 600mg /d, & Fish Oil;  10/18 he reports that [facility] can't get the Lopid=> back on it now...  HYPOTHYROID>  TSH returned at 7.10 in 03/2017 => we started Synthroid50 & repeat TSH= 0.97  GI symptoms 7/15>  Resolved w/ Colace & Miralax... He feels he is back to baseline... 03/2016>  He has mild anemia w/ Hg=10.4 & stool card pos for occult blood => refer to DrPerry for further eval... Not felt to be an endoscopic candidate- rec to take Omep20 by  DrPerry... 2/18>  [place] has finally trired!  Refills provided for 90d supplies per his request- change to Protonix40... 8/18>   [name] is well into his IMRT for the prostate cancer, starting to experience some side effects, feeling weak- labs show TSH=7.10 & we are starting Levothy52mcg/d, and his Hg=10 rec to start an MVI w/ Fe; he will continue the IMRT & planned androgen depriv therapy per Urology & RadOnc.[name] PSA/ prostate cancer => followed by [facility] for Urology on watchful waiting...  DJD>  He uses Tylenol as needed; still working regularly...  B-12 defic>  He remains on B12 shots monthly...   Patient's Medications  New Prescriptions   No medications on file  Previous Medications   ASPIRIN 81 MG TABLET    Take 81 mg by mouth daily.     CALCIUM CARBONATE-VIT D-MIN (CALCIUM 1200 PO)    Take 1 capsule by mouth daily.   CYANOCOBALAMIN (,VITAMIN B-12,) 1000 MCG/ML INJECTION    INJECT 1 ML INTO THE MUSCLE EVERY 30 DAYS   FISH OIL-OMEGA-3 FATTY ACIDS 1000 MG CAPSULE    Take 2 g by mouth daily.     GEMFIBROZIL (LOPID) 600 MG TABLET    Take 1 tablet (600 mg total) by mouth daily.   LEVOTHYROXINE (SYNTHROID) 50 MCG TABLET    Take 1 tablet (50 mcg total) by mouth daily before breakfast.   METOPROLOL SUCCINATE (TOPROL-XL) 50 MG 24 HR TABLET    Take 1 tablet (50 mg total) by mouth daily. Take with or immediately following a meal.   MULTIPLE VITAMINS-MINERALS (MENS MULTIVITAMIN PLUS PO)    Take 1 tablet by mouth daily.     OMEPRAZOLE (PRILOSEC) 20 MG CAPSULE    Take 1 capsule (20 mg total) by mouth daily. Take 30 minutes before a meal   POLYETHYLENE GLYCOL POWD    Take 1 scoop  by mouth daily.    RAMIPRIL (ALTACE) 10 MG CAPSULE    Take 1 capsule (10 mg total) by mouth daily.   SIMVASTATIN (ZOCOR) 40 MG TABLET    Take 1 tablet (40 mg total) by mouth at bedtime.  Modified Medications   Modified Medication Previous Medication   CLOPIDOGREL (PLAVIX) 75 MG TABLET clopidogrel (PLAVIX) 75 MG  tablet      Take 1 tablet (75 mg total) by mouth daily.    Take 1 tablet (75 mg total) by mouth daily.  Discontinued Medications   No medications on file

## 2018-05-27 NOTE — Telephone Encounter (Signed)
LVM for [name] to inform Dr.John has accepted Mr.Cozine as a new patient.   When making the appointment the type as OV and in the comment "NEW PATIENT/INSURANCE" Apply to this message and we will have team lead change the type.

## 2018-05-29 ENCOUNTER — Ambulatory Visit (INDEPENDENT_AMBULATORY_CARE_PROVIDER_SITE_OTHER): Payer: Medicare Other | Admitting: *Deleted

## 2018-05-29 ENCOUNTER — Telehealth: Payer: Self-pay

## 2018-05-29 DIAGNOSIS — R55 Syncope and collapse: Secondary | ICD-10-CM

## 2018-05-29 DIAGNOSIS — I5022 Chronic systolic (congestive) heart failure: Secondary | ICD-10-CM

## 2018-05-29 NOTE — Progress Notes (Signed)
Chief Complaint  Patient presents with  . Follow-up    CAD   History of Present Illness: 84 yo male with history of complete heart block, CAD s/p stenting of the LAD in 2005 and 2008, ischemic cardiomyopathy s/p ICD ( implanted 2005/generator change 2011), HLD, HTN and chronic systolic CHF here today for cardiac follow up. He has been followed by Dr. Ron Parker. His ICD is followed by Dr. [name]. Echo July 2016 with LVEF=40-45%, mild AI, trivial MR. Diagnosis of prostate cancer in 2014.   He is here today for follow up. The patient denies any palpitations, lower extremity edema, orthopnea, PND, dizziness, near syncope or syncope. He tells me that he has jaw pain and tooth pain with dyspnea after walking more than 1/2 mile. Then he has chest pain. This resolves with rest. He has been having this for years but slightly worse lately.     Primary Care Physician: Biagio Borg, MD  Past Medical History:  Diagnosis Date  . Anemia, unspecified   . Aortic stenosis    Mild, echo, October, 2011  . AV block, complete (HCC)    Permanent pacemaker  . CAD (coronary artery disease)    DES to LAD 2005 /  DES to LAD 2008  . Cardiomyopathy    Ischemic, EF 30%, 2005  . DJD (degenerative joint disease)   . Dyslipidemia   . Ejection fraction < 50%    EF 30%, echo, 2005  /  EF 30%, catheterization, 2008  . GERD (gastroesophageal reflux disease)   . Hyperlipidemia   . Hypertension   . ICD (implantable cardiac defibrillator), single, in situ    for syncope and arrhythmia 2005 / generator change June, 2011  . LBBB (left bundle branch block)   . Mitral regurgitation    Mild, echo, October, 2011  . Prostate cancer ([place])   . Rectal fissure   . Syncope    2005, ICD placed  . Systolic CHF, chronic ([place])   . Vitamin B12 deficiency     Past Surgical History:  Procedure Laterality Date  . CARDIAC CATHETERIZATION  2008  . icd implanted  2005  . LAPAROSCOPIC CHOLECYSTECTOMY  04/2005   Dr. Lucia Gaskins  . ROTATOR  CUFF REPAIR      Current Outpatient Medications  Medication Sig Dispense Refill  . aspirin 81 MG tablet Take 81 mg by mouth daily.      . Calcium Carbonate-Vit D-Min (CALCIUM 1200 PO) Take 1 capsule by mouth daily.    . clopidogrel (PLAVIX) 75 MG tablet Take 1 tablet (75 mg total) by mouth daily. 90 tablet 4  . cyanocobalamin (,VITAMIN B-12,) 1000 MCG/ML injection INJECT 1 ML INTO THE MUSCLE EVERY 30 DAYS 1 mL 5  . fish oil-omega-3 fatty acids 1000 MG capsule Take 2 g by mouth daily.      Marland Kitchen gemfibrozil (LOPID) 600 MG tablet Take 1 tablet (600 mg total) by mouth daily. 90 tablet 3  . levothyroxine (SYNTHROID) 50 MCG tablet Take 1 tablet (50 mcg total) by mouth daily before breakfast. 90 tablet 3  . metoprolol succinate (TOPROL-XL) 50 MG 24 hr tablet Take 1 tablet (50 mg total) by mouth daily. Take with or immediately following a meal. 90 tablet 3  . Multiple Vitamins-Minerals (MENS MULTIVITAMIN PLUS PO) Take 1 tablet by mouth daily.      Marland Kitchen omeprazole (PRILOSEC) 20 MG capsule Take 1 capsule (20 mg total) by mouth daily. Take 30 minutes before a meal 90 capsule 3  .  Polyethylene Glycol POWD Take 1 scoop by mouth daily.     . ramipril (ALTACE) 10 MG capsule Take 1 capsule (10 mg total) by mouth daily. 90 capsule 3  . simvastatin (ZOCOR) 40 MG tablet Take 1 tablet (40 mg total) by mouth at bedtime. 90 tablet 2   No current facility-administered medications for this visit.     No Known Allergies  Social History   Socioeconomic History  . Marital status: Widowed    Spouse name: Not on file  . Number of children: 2  . Years of education: Not on file  . Highest education level: Not on file  Occupational History  . Occupation: repairs looms at [employer]  . Financial resource strain: Not on file  . Food insecurity:    Worry: Not on file    Inability: Not on file  . Transportation needs:    Medical: Not on file    Non-medical: Not on file  Tobacco Use  . Smoking status:  Former Smoker    Packs/day: 1.00    Years: 2.00    Pack years: 2.00    Types: Cigarettes    Last attempt to quit: 08/06/1952    Years since quitting: 65.8  . Smokeless tobacco: Never Used  Substance and Sexual Activity  . Alcohol use: No    Alcohol/week: 0.0 standard drinks  . Drug use: No  . Sexual activity: Not Currently  Lifestyle  . Physical activity:    Days per week: Not on file    Minutes per session: Not on file  . Stress: Not on file  Relationships  . Social connections:    Talks on phone: Not on file    Gets together: Not on file    Attends religious service: Not on file    Active member of club or organization: Not on file    Attends meetings of clubs or organizations: Not on file    Relationship status: Not on file  . Intimate partner violence:    Fear of current or ex partner: Not on file    Emotionally abused: Not on file    Physically abused: Not on file    Forced sexual activity: Not on file  Other Topics Concern  . Not on file  Social History Narrative  . Not on file    Family History  Problem Relation Age of Onset  . Stroke Mother   . Cancer Mother 27       unknown  . Heart attack Father 10  . Heart Problems Father   . Heart attack Brother   . Lung cancer Brother   . Stroke Brother   . Heart Problems Brother        CABG  . Lung cancer Sister   . Other Sister        PACEMAKER  . Breast cancer Maternal Aunt     Review of Systems:  As stated in the HPI and otherwise negative.   BP (!) 148/70   Pulse (!) 53   Ht 5\' 11"  (1.803 m)   Wt 171 lb (77.6 kg)   SpO2 97%   BMI 23.85 kg/m   Physical Examination:  General: Well developed, well nourished, NAD  HEENT: OP clear, mucus membranes moist  SKIN: warm, dry. No rashes. Neuro: No focal deficits  Musculoskeletal: Muscle strength 5/5 all ext  Psychiatric: Mood and affect normal  Neck: No JVD, no carotid bruits, no thyromegaly, no lymphadenopathy.  Lungs:Clear bilaterally, no  wheezes,  rhonci, crackles Cardiovascular: Regular rate and rhythm. No murmurs, gallops or rubs. Abdomen:Soft. Bowel sounds present. Non-tender.  Extremities: No lower extremity edema. Pulses are 2 + in the bilateral DP/PT.  Echo July 2016: Left ventricle: The cavity size was normal. Systolic function was   mildly to moderately reduced. The estimated ejection fraction was   in the range of 40% to 45%. Endocardial segments are not well   visulaized. There is akinesis of the apical septal myocardium.   There is akinesis of the midinferoseptal myocardium. There is   akinesis of the entire apical myocardium. Recommend limited echo   with definity contrast to adequately evaluate for wall motion   abnormalities. There was an increased relative contribution of   atrial contraction to ventricular filling. Doppler parameters are   consistent with abnormal left ventricular relaxation (grade 1   diastolic dysfunction). - Ventricular septum: Septal motion showed paradox. - Aortic valve: Severely focal calcification of the aortic vavle   annulus. Trileaflet. Mild thickening and calcification,   consistent with sclerosis. There was mild regurgitation. - Mitral valve: Calcified annulus. Mildly thickened leaflets . - Pulmonic valve: There was trivial regurgitation.  EKG:  EKG is not ordered today. The ekg ordered today demonstrates   Recent Labs: 08/13/2017: ALT 10 02/07/2018: BUN 30; Creatinine, Ser 1.38; Potassium 4.4; Sodium 141; TSH 3.63 02/11/2018: Hemoglobin 10.0; Platelets 238.0   Lipid Panel    Component Value Date/Time   CHOL 179 08/13/2017 1027   TRIG 181.0 (H) 08/13/2017 1027   HDL 44.70 08/13/2017 1027   CHOLHDL 4 08/13/2017 1027   VLDL 36.2 08/13/2017 1027   LDLCALC 99 08/13/2017 1027   LDLDIRECT 96.3 02/26/2011 0735     Wt Readings from Last 3 Encounters:  05/30/18 171 lb (77.6 kg)  02/11/18 168 lb 6.4 oz (76.4 kg)  10/22/17 167 lb 9.6 oz (76 kg)     Other studies  Reviewed: Additional studies/ records that were reviewed today include: . Review of the above records demonstrates:   Assessment and Plan:   1. CAD with angina: He has been having chest pain with moderate exertion. Will arrange Lexiscan nuclear stress test. Will continue ASA, Plavix, beta blocker and statin.   2. Ischemic cardiomyopathy/Chronic systolic CHF: Echo July 8101 with LVEF=40-45%. Weight is stable. ICD in place. Continue beta blocker and Ace-inh.   3. HTN: BP is controlled at home. No changes.   4. HLD: His lipids are followed in primary care. He will continue the statin.    5. Complete heart blocker: ICD in place.   Current medicines are reviewed at length with the patient today.  The patient does not have concerns regarding medicines.  The following changes have been made:  no change  Labs/ tests ordered today include:   Orders Placed This Encounter  Procedures  . MYOCARDIAL PERFUSION IMAGING    Disposition:   FU with me in 12 months  Signed, Lauree Chandler, MD 05/30/2018 9:00 AM    [address], [place], [place]  [address] Phone: [phone]; Fax: [phone]

## 2018-05-29 NOTE — Telephone Encounter (Signed)
LMOVM reminding pt to send remote transmission.   

## 2018-05-30 ENCOUNTER — Encounter: Payer: Self-pay | Admitting: *Deleted

## 2018-05-30 ENCOUNTER — Encounter: Payer: Self-pay | Admitting: Cardiovascular Disease

## 2018-05-30 ENCOUNTER — Ambulatory Visit (INDEPENDENT_AMBULATORY_CARE_PROVIDER_SITE_OTHER): Payer: Medicare Other | Admitting: Cardiovascular Disease

## 2018-05-30 VITALS — BP 148/70 | HR 53 | Ht 71.0 in | Wt 171.0 lb

## 2018-05-30 DIAGNOSIS — I10 Essential (primary) hypertension: Secondary | ICD-10-CM | POA: Diagnosis not present

## 2018-05-30 DIAGNOSIS — I255 Ischemic cardiomyopathy: Secondary | ICD-10-CM

## 2018-05-30 DIAGNOSIS — I25119 Atherosclerotic heart disease of native coronary artery with unspecified angina pectoris: Secondary | ICD-10-CM

## 2018-05-30 DIAGNOSIS — I5022 Chronic systolic (congestive) heart failure: Secondary | ICD-10-CM | POA: Diagnosis not present

## 2018-05-30 NOTE — Progress Notes (Signed)
Remote ICD transmission.   

## 2018-05-30 NOTE — Patient Instructions (Signed)
Medication Instructions:  Your physician recommends that you continue on your current medications as directed. Please refer to the Current Medication list given to you today.  If you need a refill on your cardiac medications before your next appointment, please call your pharmacy.   Lab work: none If you have labs (blood work) drawn today and your tests are completely normal, you will receive your results only by: Marland Kitchen MyChart Message (if you have MyChart) OR . A paper copy in the mail If you have any lab test that is abnormal or we need to change your treatment, we will call you to review the results.  Testing/Procedures: Your physician has requested that you have a lexiscan myoview. For further information please visit [email or web address]. Please follow instruction sheet, as given.    Follow-Up:  Your physician recommends that you schedule a follow-up appointment in: March with Dr. [name]   At [facility], you and your health needs are our priority.  As part of our continuing mission to provide you with exceptional heart care, we have created designated Provider Care Teams.  These Care Teams include your primary Cardiologist (physician) and Advanced Practice Providers (APPs -  Physician Assistants and Nurse Practitioners) who all work together to provide you with the care you need, when you need it. You will need a follow up appointment in 12 months.  Please call our office 2 months in advance to schedule this appointment.  You may see Dr. Angelena Form  or one of the following Advanced Practice Providers on your designated Care Team:   [place], PA-C Melina Copa, PA-C . Ermalinda Barrios, PA-C  Any Other Special Instructions Will Be Listed Below (If Applicable).

## 2018-06-02 NOTE — Telephone Encounter (Signed)
Appointment has been made for 11/12.

## 2018-06-04 ENCOUNTER — Ambulatory Visit (INDEPENDENT_AMBULATORY_CARE_PROVIDER_SITE_OTHER): Payer: Medicare Other

## 2018-06-04 DIAGNOSIS — E538 Deficiency of other specified B group vitamins: Secondary | ICD-10-CM

## 2018-06-05 MED ORDER — CYANOCOBALAMIN 1000 MCG/ML IJ SOLN
1000.0000 ug | Freq: Once | INTRAMUSCULAR | Status: AC
  Administered 2018-06-04: 1000 ug via INTRAMUSCULAR

## 2018-06-05 NOTE — Progress Notes (Signed)
Documented by Mozelle Remlinger CMA based on hand-written B-12 Injection documentation sheet completed by Tammy Scott CMA, who administered the medication.  

## 2018-06-16 ENCOUNTER — Telehealth (HOSPITAL_COMMUNITY): Payer: Self-pay | Admitting: *Deleted

## 2018-06-16 DIAGNOSIS — Z8546 Personal history of malignant neoplasm of prostate: Secondary | ICD-10-CM | POA: Diagnosis not present

## 2018-06-16 NOTE — Telephone Encounter (Signed)
Patient given detailed instructions per Myocardial Perfusion Study Information Sheet for the test on 06/18/18 at 0730. Patient notified to arrive 15 minutes early and that it is imperative to arrive on time for appointment to keep from having the test rescheduled.  If you need to cancel or reschedule your appointment, please call the office within 24 hours of your appointment. . Patient verbalized understanding.[name], Ranae Palms

## 2018-06-17 ENCOUNTER — Ambulatory Visit (INDEPENDENT_AMBULATORY_CARE_PROVIDER_SITE_OTHER): Payer: Medicare Other | Admitting: Internal Medicine

## 2018-06-17 ENCOUNTER — Encounter: Payer: Self-pay | Admitting: Internal Medicine

## 2018-06-17 VITALS — BP 122/72 | HR 55 | Temp 97.7°F | Ht 71.0 in | Wt 169.0 lb

## 2018-06-17 DIAGNOSIS — E538 Deficiency of other specified B group vitamins: Secondary | ICD-10-CM

## 2018-06-17 DIAGNOSIS — I255 Ischemic cardiomyopathy: Secondary | ICD-10-CM

## 2018-06-17 DIAGNOSIS — I5022 Chronic systolic (congestive) heart failure: Secondary | ICD-10-CM | POA: Diagnosis not present

## 2018-06-17 DIAGNOSIS — D509 Iron deficiency anemia, unspecified: Secondary | ICD-10-CM

## 2018-06-17 DIAGNOSIS — I10 Essential (primary) hypertension: Secondary | ICD-10-CM | POA: Diagnosis not present

## 2018-06-17 HISTORY — DX: Iron deficiency anemia, unspecified: D50.9

## 2018-06-17 NOTE — Patient Instructions (Addendum)
Please return for a Nurse Visit monthly for further B12 shots  Please continue all other medications as before, and refills have been done if requested.  Please have the pharmacy call with any other refills you may need.  Please continue your efforts at being more active, low cholesterol diet, and weight control  Please keep your appointments with your specialists as you may have planned  Please return in 6 months, or sooner if needed,

## 2018-06-17 NOTE — Assessment & Plan Note (Signed)
To continue IM b12 with monthly nurse visits

## 2018-06-17 NOTE — Assessment & Plan Note (Signed)
stable overall by history and exam, recent data reviewed with pt, and pt to continue medical treatment as before,  to f/u any worsening symptoms or concerns  

## 2018-06-17 NOTE — Assessment & Plan Note (Signed)
stable overall by history and exam, recent data reviewed with pt, and pt to continue medical treatment as before,  to f/u any worsening symptoms or concerns, for f/u lab 

## 2018-06-17 NOTE — Progress Notes (Signed)
Subjective:    Patient ID: [name], male    DOB: July 04, 1932, 84 y.o.   MRN: [id number]  HPI   Here to establish, overall doing ok,  Pt denies chest pain, increasing sob or doe, wheezing, orthopnea, PND, increased LE swelling, palpitations, dizziness or syncope.  Pt denies new neurological symptoms such as new headache, or facial or extremity weakness or numbness.  Pt denies polydipsia, polyuria, or low sugar episode.  Pt states overall good compliance with meds, mostly trying to follow appropriate diet, with wt overall stable,  Pt denies fever, wt loss, night sweats, loss of appetite, or other constitutional symptoms Past Medical History:  Diagnosis Date  . Anemia, unspecified   . Aortic stenosis    Mild, echo, October, 2011  . AV block, complete (HCC)    Permanent pacemaker  . CAD (coronary artery disease)    DES to LAD 2005 /  DES to LAD 2008  . Cardiomyopathy    Ischemic, EF 30%, 2005  . DJD (degenerative joint disease)   . Dyslipidemia   . Ejection fraction < 50%    EF 30%, echo, 2005  /  EF 30%, catheterization, 2008  . GERD (gastroesophageal reflux disease)   . Hyperlipidemia   . Hypertension   . ICD (implantable cardiac defibrillator), single, in situ    for syncope and arrhythmia 2005 / generator change June, 2011  . Iron deficiency anemia 06/17/2018  . LBBB (left bundle branch block)   . Mitral regurgitation    Mild, echo, October, 2011  . Prostate cancer ([place])   . Rectal fissure   . Syncope    2005, ICD placed  . Systolic CHF, chronic ([place])   . Vitamin B12 deficiency    Past Surgical History:  Procedure Laterality Date  . CARDIAC CATHETERIZATION  2008  . icd implanted  2005  . LAPAROSCOPIC CHOLECYSTECTOMY  04/2005   Dr. Lucia Gaskins  . ROTATOR CUFF REPAIR      reports that he quit smoking about 65 years ago. His smoking use included cigarettes. He has a 2.00 pack-year smoking history. He has never used smokeless tobacco. He reports that he does not drink  alcohol or use drugs. family history includes Breast cancer in his maternal aunt; Cancer (age of onset: 49) in his mother; Heart Problems in his brother and father; Heart attack in his brother; Heart attack (age of onset: 52) in his father; Lung cancer in his brother and sister; Other in his sister; Stroke in his brother and mother. No Known Allergies Current Outpatient Medications on File Prior to Visit  Medication Sig Dispense Refill  . aspirin 81 MG tablet Take 81 mg by mouth daily.      . Calcium Carbonate-Vit D-Min (CALCIUM 1200 PO) Take 1 capsule by mouth daily.    . clopidogrel (PLAVIX) 75 MG tablet Take 1 tablet (75 mg total) by mouth daily. 90 tablet 4  . cyanocobalamin (,VITAMIN B-12,) 1000 MCG/ML injection INJECT 1 ML INTO THE MUSCLE EVERY 30 DAYS 1 mL 5  . fish oil-omega-3 fatty acids 1000 MG capsule Take 2 g by mouth daily.      Marland Kitchen gemfibrozil (LOPID) 600 MG tablet Take 1 tablet (600 mg total) by mouth daily. 90 tablet 3  . levothyroxine (SYNTHROID) 50 MCG tablet Take 1 tablet (50 mcg total) by mouth daily before breakfast. 90 tablet 3  . metoprolol succinate (TOPROL-XL) 50 MG 24 hr tablet Take 1 tablet (50 mg total) by mouth daily. Take with  or immediately following a meal. 90 tablet 3  . Multiple Vitamins-Minerals (MENS MULTIVITAMIN PLUS PO) Take 1 tablet by mouth daily.      Marland Kitchen omeprazole (PRILOSEC) 20 MG capsule Take 1 capsule (20 mg total) by mouth daily. Take 30 minutes before a meal 90 capsule 3  . Polyethylene Glycol POWD Take 1 scoop by mouth daily.     . ramipril (ALTACE) 10 MG capsule Take 1 capsule (10 mg total) by mouth daily. 90 capsule 3  . simvastatin (ZOCOR) 40 MG tablet Take 1 tablet (40 mg total) by mouth at bedtime. 90 tablet 2   No current facility-administered medications on file prior to visit.    Review of Systems  Constitutional: Negative for other unusual diaphoresis or sweats HENT: Negative for ear discharge or swelling Eyes: Negative for other worsening  visual disturbances Respiratory: Negative for stridor or other swelling  Gastrointestinal: Negative for worsening distension or other blood Genitourinary: Negative for retention or other urinary change Musculoskeletal: Negative for other MSK pain or swelling Skin: Negative for color change or other new lesions Neurological: Negative for worsening tremors and other numbness  Psychiatric/Behavioral: Negative for worsening agitation or other fatigue All other system neg per pt    Objective:   Physical Exam BP 122/72   Pulse (!) 55   Temp 97.7 F (36.5 C) (Oral)   Ht 5\' 11"  (1.803 m)   Wt 169 lb (76.7 kg)   SpO2 95%   BMI 23.57 kg/m  VS noted,  Constitutional: Pt appears in NAD HENT: Head: NCAT.  Right Ear: External ear normal.  Left Ear: External ear normal.  Eyes: . Pupils are equal, round, and reactive to light. Conjunctivae and EOM are normal Nose: without d/c or deformity Neck: Neck supple. Gross normal ROM Cardiovascular: Normal rate and regular rhythm.   Pulmonary/Chest: Effort normal and breath sounds without rales or wheezing.  Abd:  Soft, NT, ND, + BS, no organomegaly Neurological: Pt is alert. At baseline orientation, motor grossly intact Skin: Skin is warm. No rashes, other new lesions, no LE edema Psychiatric: Pt behavior is normal without agitation  \No other exam findings Lab Results  Component Value Date   WBC 4.7 02/11/2018   HGB 10.0 (L) 02/11/2018   HCT 27.9 (L) 02/11/2018   PLT 238.0 02/11/2018   GLUCOSE 99 02/07/2018   CHOL 179 08/13/2017   TRIG 181.0 (H) 08/13/2017   HDL 44.70 08/13/2017   LDLDIRECT 96.3 02/26/2011   LDLCALC 99 08/13/2017   ALT 10 08/13/2017   AST 19 08/13/2017   NA 141 02/07/2018   K 4.4 02/07/2018   CL 107 02/07/2018   CREATININE 1.38 02/07/2018   BUN 30 (H) 02/07/2018   CO2 24 02/07/2018   TSH 3.63 02/07/2018   PSA 12.25 (H) 03/09/2016   INR 0.95 01/06/2010          Assessment & Plan:

## 2018-06-18 ENCOUNTER — Ambulatory Visit (HOSPITAL_COMMUNITY): Payer: Medicare Other | Attending: Cardiovascular Disease

## 2018-06-18 DIAGNOSIS — I25119 Atherosclerotic heart disease of native coronary artery with unspecified angina pectoris: Secondary | ICD-10-CM

## 2018-06-18 MED ORDER — REGADENOSON 0.4 MG/5ML IV SOLN
0.4000 mg | Freq: Once | INTRAVENOUS | Status: AC
  Administered 2018-06-18: 0.4 mg via INTRAVENOUS

## 2018-06-18 MED ORDER — TECHNETIUM TC 99M TETROFOSMIN IV KIT
31.6000 | PACK | Freq: Once | INTRAVENOUS | Status: AC | PRN
  Administered 2018-06-18: 31.6 via INTRAVENOUS
  Filled 2018-06-18: qty 32

## 2018-06-18 MED ORDER — TECHNETIUM TC 99M TETROFOSMIN IV KIT
10.2000 | PACK | Freq: Once | INTRAVENOUS | Status: AC | PRN
  Administered 2018-06-18: 10.2 via INTRAVENOUS
  Filled 2018-06-18: qty 11

## 2018-06-19 ENCOUNTER — Other Ambulatory Visit: Payer: Self-pay | Admitting: *Deleted

## 2018-06-19 LAB — MYOCARDIAL PERFUSION IMAGING
CHL CUP NUCLEAR SSS: 13
CHL CUP RESTING HR STRESS: 50 {beats}/min
LV dias vol: 172 mL (ref 62–150)
LVSYSVOL: 113 mL
NUC STRESS TID: 1.07
Peak HR: 66 {beats}/min
SDS: 3
SRS: 9

## 2018-06-19 MED ORDER — GEMFIBROZIL 600 MG PO TABS: 600.0000 mg | ORAL_TABLET | Freq: Every day | ORAL | 2 refills | Status: DC

## 2018-06-20 ENCOUNTER — Telehealth: Payer: Self-pay | Admitting: Cardiovascular Disease

## 2018-06-20 NOTE — Telephone Encounter (Signed)
New message   Patient's daughter is returning call to get results.

## 2018-06-20 NOTE — Telephone Encounter (Signed)
Notes recorded by Burnell Blanks, MD on 06/20/2018 at 8:36 AM EST His stress test is mildly abnormal. Cannot exclude ischemia. He has a history of CAD and has been having tooth and jaw pain with exertion. Can we let him know and put him on to see me end of next week to discuss? [name]  I spoke with pt's daughter and reviewed stress test results with her.  I scheduled pt to see Dr. Angelena Form on 06/26/18 at 8:20

## 2018-06-23 DIAGNOSIS — C61 Malignant neoplasm of prostate: Secondary | ICD-10-CM | POA: Diagnosis not present

## 2018-06-26 ENCOUNTER — Encounter: Payer: Self-pay | Admitting: Cardiovascular Disease

## 2018-06-26 ENCOUNTER — Ambulatory Visit (INDEPENDENT_AMBULATORY_CARE_PROVIDER_SITE_OTHER): Payer: Medicare Other | Admitting: Cardiovascular Disease

## 2018-06-26 VITALS — BP 144/70 | HR 50 | Ht 71.0 in | Wt 170.0 lb

## 2018-06-26 DIAGNOSIS — I255 Ischemic cardiomyopathy: Secondary | ICD-10-CM | POA: Diagnosis not present

## 2018-06-26 DIAGNOSIS — I10 Essential (primary) hypertension: Secondary | ICD-10-CM

## 2018-06-26 DIAGNOSIS — I442 Atrioventricular block, complete: Secondary | ICD-10-CM | POA: Diagnosis not present

## 2018-06-26 DIAGNOSIS — I25119 Atherosclerotic heart disease of native coronary artery with unspecified angina pectoris: Secondary | ICD-10-CM | POA: Diagnosis not present

## 2018-06-26 DIAGNOSIS — I5022 Chronic systolic (congestive) heart failure: Secondary | ICD-10-CM | POA: Diagnosis not present

## 2018-06-26 NOTE — Progress Notes (Signed)
Chief Complaint  Patient presents with  . Follow-up    CAD   History of Present Illness: 84 yo male with history of complete heart block, CAD s/p stenting of the LAD in 2005 and 2008, ischemic cardiomyopathy s/p ICD ( implanted 2005/generator change 2011), HLD, HTN and chronic systolic CHF here today for cardiac follow up. He had been followed by Dr. Ron Parker prior to his retirement. His ICD is followed by Dr. [name]. Echo July 2016 with LVEF=40-45%, mild AI, trivial MR. Diagnosis of prostate cancer in 2014. I saw him in October 2019 and he c/o jaw pain and tooth pain with dyspnea with moderate exertion. Nuclear stress test June 18, 2018 with medium sized inferior, inferoseptal and apical defect with scar and possible soft tissue attenuation but no ischemia. LVEF noted to be 35% with hypokinesis of the inferior, inferoseptal and apical walls. The wall motion abnormalities are c/w those seen on his echo in July 2016.   He is here today for follow up. The patient denies any dyspnea, palpitations, lower extremity edema, orthopnea, PND, near syncope or syncope. He tells me that he has mild jaw and tooth pain with moderate to heavy exertion. This is unchanged over the past 10 years. He has rare chest burning but this has not changed in frequency or severity over the past few years. He has some dizziness with bending over. Overall he is very active and tells me that he feels great most days.     Primary Care Physician: Biagio Borg, MD  Past Medical History:  Diagnosis Date  . Anemia, unspecified   . Aortic stenosis    Mild, echo, October, 2011  . AV block, complete (HCC)    Permanent pacemaker  . CAD (coronary artery disease)    DES to LAD 2005 /  DES to LAD 2008  . Cardiomyopathy    Ischemic, EF 30%, 2005  . DJD (degenerative joint disease)   . Dyslipidemia   . Ejection fraction < 50%    EF 30%, echo, 2005  /  EF 30%, catheterization, 2008  . GERD (gastroesophageal reflux disease)   .  Hyperlipidemia   . Hypertension   . ICD (implantable cardiac defibrillator), single, in situ    for syncope and arrhythmia 2005 / generator change June, 2011  . Iron deficiency anemia 06/17/2018  . LBBB (left bundle branch block)   . Mitral regurgitation    Mild, echo, October, 2011  . Prostate cancer ([place])   . Rectal fissure   . Syncope    2005, ICD placed  . Systolic CHF, chronic ([place] [place])   . Vitamin B12 deficiency     Past Surgical History:  Procedure Laterality Date  . CARDIAC CATHETERIZATION  2008  . icd implanted  2005  . LAPAROSCOPIC CHOLECYSTECTOMY  04/2005   Dr. Lucia Gaskins  . ROTATOR CUFF REPAIR      Current Outpatient Medications  Medication Sig Dispense Refill  . aspirin 81 MG tablet Take 81 mg by mouth daily.      . Calcium Carbonate-Vit D-Min (CALCIUM 1200 PO) Take 1 capsule by mouth daily.    . clopidogrel (PLAVIX) 75 MG tablet Take 1 tablet (75 mg total) by mouth daily. 90 tablet 4  . cyanocobalamin (,VITAMIN B-12,) 1000 MCG/ML injection INJECT 1 ML INTO THE MUSCLE EVERY 30 DAYS 1 mL 5  . fish oil-omega-3 fatty acids 1000 MG capsule Take 2 g by mouth daily.      Marland Kitchen gemfibrozil (LOPID) 600 MG tablet  Take 1 tablet (600 mg total) by mouth daily. 90 tablet 2  . levothyroxine (SYNTHROID) 50 MCG tablet Take 1 tablet (50 mcg total) by mouth daily before breakfast. 90 tablet 3  . metoprolol succinate (TOPROL-XL) 50 MG 24 hr tablet Take 1 tablet (50 mg total) by mouth daily. Take with or immediately following a meal. 90 tablet 3  . Multiple Vitamins-Minerals (MENS MULTIVITAMIN PLUS PO) Take 1 tablet by mouth daily.      Marland Kitchen omeprazole (PRILOSEC) 20 MG capsule Take 1 capsule (20 mg total) by mouth daily. Take 30 minutes before a meal 90 capsule 3  . Polyethylene Glycol POWD Take 1 scoop by mouth daily.     . ramipril (ALTACE) 10 MG capsule Take 1 capsule (10 mg total) by mouth daily. 90 capsule 3  . simvastatin (ZOCOR) 40 MG tablet Take 1 tablet (40 mg total) by mouth at bedtime. 90  tablet 2   No current facility-administered medications for this visit.     No Known Allergies  Social History   Socioeconomic History  . Marital status: Widowed    Spouse name: Not on file  . Number of children: 2  . Years of education: Not on file  . Highest education level: Not on file  Occupational History  . Occupation: repairs looms at [employer]  . Financial resource strain: Not on file  . Food insecurity:    Worry: Not on file    Inability: Not on file  . Transportation needs:    Medical: Not on file    Non-medical: Not on file  Tobacco Use  . Smoking status: Former Smoker    Packs/day: 1.00    Years: 2.00    Pack years: 2.00    Types: Cigarettes    Last attempt to quit: 08/06/1952    Years since quitting: 65.9  . Smokeless tobacco: Never Used  Substance and Sexual Activity  . Alcohol use: No    Alcohol/week: 0.0 standard drinks  . Drug use: No  . Sexual activity: Not Currently  Lifestyle  . Physical activity:    Days per week: Not on file    Minutes per session: Not on file  . Stress: Not on file  Relationships  . Social connections:    Talks on phone: Not on file    Gets together: Not on file    Attends religious service: Not on file    Active member of club or organization: Not on file    Attends meetings of clubs or organizations: Not on file    Relationship status: Not on file  . Intimate partner violence:    Fear of current or ex partner: Not on file    Emotionally abused: Not on file    Physically abused: Not on file    Forced sexual activity: Not on file  Other Topics Concern  . Not on file  Social History Narrative  . Not on file    Family History  Problem Relation Age of Onset  . Stroke Mother   . Cancer Mother 75       unknown  . Heart attack Father 45  . Heart Problems Father   . Heart attack Brother   . Lung cancer Brother   . Stroke Brother   . Heart Problems Brother        CABG  . Lung cancer Sister   .  Other Sister        PACEMAKER  . Breast  cancer Maternal Aunt     Review of Systems:  As stated in the HPI and otherwise negative.   BP (!) 144/70   Pulse (!) 50   Ht 5\' 11"  (1.803 m)   Wt 170 lb (77.1 kg)   SpO2 99%   BMI 23.71 kg/m   Physical Examination:  General: Well developed, well nourished, NAD  HEENT: OP clear, mucus membranes moist  SKIN: warm, dry. No rashes. Neuro: No focal deficits  Musculoskeletal: Muscle strength 5/5 all ext  Psychiatric: Mood and affect normal  Neck: No JVD, no carotid bruits, no thyromegaly, no lymphadenopathy.  Lungs:Clear bilaterally, no wheezes, rhonci, crackles Cardiovascular: Regular rate and rhythm. No murmurs, gallops or rubs. Abdomen:Soft. Bowel sounds present. Non-tender.  Extremities: No lower extremity edema. Pulses are 2 + in the bilateral DP/PT.  Echo July 2016: Left ventricle: The cavity size was normal. Systolic function was   mildly to moderately reduced. The estimated ejection fraction was   in the range of 40% to 45%. Endocardial segments are not well   visulaized. There is akinesis of the apical septal myocardium.   There is akinesis of the midinferoseptal myocardium. There is   akinesis of the entire apical myocardium. Recommend limited echo   with definity contrast to adequately evaluate for wall motion   abnormalities. There was an increased relative contribution of   atrial contraction to ventricular filling. Doppler parameters are   consistent with abnormal left ventricular relaxation (grade 1   diastolic dysfunction). - Ventricular septum: Septal motion showed paradox. - Aortic valve: Severely focal calcification of the aortic vavle   annulus. Trileaflet. Mild thickening and calcification,   consistent with sclerosis. There was mild regurgitation. - Mitral valve: Calcified annulus. Mildly thickened leaflets . - Pulmonic valve: There was trivial regurgitation.  EKG:  EKG is not ordered today. The ekg ordered  today demonstrates   Recent Labs: 08/13/2017: ALT 10 02/07/2018: BUN 30; Creatinine, Ser 1.38; Potassium 4.4; Sodium 141; TSH 3.63 02/11/2018: Hemoglobin 10.0; Platelets 238.0   Lipid Panel    Component Value Date/Time   CHOL 179 08/13/2017 1027   TRIG 181.0 (H) 08/13/2017 1027   HDL 44.70 08/13/2017 1027   CHOLHDL 4 08/13/2017 1027   VLDL 36.2 08/13/2017 1027   LDLCALC 99 08/13/2017 1027   LDLDIRECT 96.3 02/26/2011 0735     Wt Readings from Last 3 Encounters:  06/26/18 170 lb (77.1 kg)  06/18/18 171 lb (77.6 kg)  06/17/18 169 lb (76.7 kg)     Other studies Reviewed: Additional studies/ records that were reviewed today include: . Review of the above records demonstrates:   Assessment and Plan:   1. CAD with angina: He continues to have mild jaw pain and tooth pain with exertion. This has not changed for years in frequency or severity. His stress test shows no ischemia. Expected wall motion abnormality with scar that matches wall motion abnormality seen on echo in 2016. Will not pursue further ischemic workup at this time as he has no change in symptoms over the past 5 years. Continue ASA, Plavix, statin and beta blocker.    2. Ischemic cardiomyopathy/Chronic systolic CHF: Echo July 8657 with LVEF=40-45%. LVEF of 35% by nuclear stress test last week. His weight is stable. No edema. ICD is in place. Will continue the beta blocker and Ace-inh.   3. HTN: BP is well controlled.   4. HLD: His lipids are followed in primary care. Continue statin.    5. Complete heart block: ICD in  place. Followed in EP/Pacer clinic.   Current medicines are reviewed at length with the patient today.  The patient does not have concerns regarding medicines.  The following changes have been made:  no change  Labs/ tests ordered today include:   No orders of the defined types were placed in this encounter.   Disposition:   FU with me in 6 months  Signed, Lauree Chandler, MD 06/26/2018 8:38 AM     [facility] [place], [place],   [address] Phone: [phone]; Fax: [phone]

## 2018-06-26 NOTE — Patient Instructions (Signed)

## 2018-07-07 ENCOUNTER — Ambulatory Visit (INDEPENDENT_AMBULATORY_CARE_PROVIDER_SITE_OTHER): Payer: Medicare Other

## 2018-07-07 DIAGNOSIS — E538 Deficiency of other specified B group vitamins: Secondary | ICD-10-CM

## 2018-07-07 MED ORDER — CYANOCOBALAMIN 1000 MCG/ML IJ SOLN
1000.0000 ug | Freq: Once | INTRAMUSCULAR | Status: AC
  Administered 2018-07-07: 1000 ug via INTRAMUSCULAR

## 2018-07-07 NOTE — Progress Notes (Signed)
Medical screening examination/treatment/procedure(s) were performed by non-physician practitioner and as supervising physician I was immediately available for consultation/collaboration. I agree with above. James John, MD   

## 2018-07-09 ENCOUNTER — Other Ambulatory Visit: Payer: Self-pay | Admitting: Internal Medicine

## 2018-07-09 MED ORDER — METOPROLOL SUCCINATE ER 50 MG PO TB24: 50.0000 mg | ORAL_TABLET | Freq: Every day | ORAL | 3 refills | Status: DC

## 2018-07-09 MED ORDER — SIMVASTATIN 40 MG PO TABS: 40.0000 mg | ORAL_TABLET | Freq: Every day | ORAL | 2 refills | Status: DC

## 2018-07-09 MED ORDER — OMEPRAZOLE 20 MG PO CPDR: 20.0000 mg | DELAYED_RELEASE_CAPSULE | Freq: Every day | ORAL | 3 refills | Status: DC

## 2018-07-09 MED ORDER — LEVOTHYROXINE SODIUM 50 MCG PO TABS: 50.0000 ug | ORAL_TABLET | Freq: Every day | ORAL | 3 refills | Status: DC

## 2018-07-09 MED ORDER — RAMIPRIL 10 MG PO CAPS: 10.0000 mg | ORAL_CAPSULE | Freq: Every day | ORAL | 3 refills | Status: DC

## 2018-07-09 MED ORDER — GEMFIBROZIL 600 MG PO TABS: 600.0000 mg | ORAL_TABLET | Freq: Every day | ORAL | 2 refills | Status: DC

## 2018-07-09 NOTE — Telephone Encounter (Signed)
Copied from [place] [id number]. Topic: Quick Communication - Rx Refill/Question >> Jul 09, 2018  8:12 AM Margot Ables wrote: Medication: gemfibrozil (LOPID) 600 MG tablet - last time this was sent to local pharmacy but now mail order has it in stock - pt needing 90 day supply to be sent to mail order pharmacy  levothyroxine (SYNTHROID) 50 MCG tablet - no refills left - 90 day supply to mail order metoprolol succinate (TOPROL-XL) 50 MG 24 hr tablet - no refills left - 90 day supply to mail order omeprazole (PRILOSEC) 20 MG capsule - no refills left - 90 day supply to mail order ramipril (ALTACE) 10 MG capsule - no refills left - 90 day supply to mail order simvastatin (ZOCOR) 40 MG tablet - no refills left - 90 day supply to mail order  Has the patient contacted their pharmacy? Yes - change of provider and new RX needed for medications next refill Preferred Pharmacy (with phone number or street name): [facility] [place], [place] to [address] [phone] (Phone) [phone] (Fax)

## 2018-07-17 ENCOUNTER — Ambulatory Visit (INDEPENDENT_AMBULATORY_CARE_PROVIDER_SITE_OTHER): Payer: Medicare Other

## 2018-07-17 DIAGNOSIS — I255 Ischemic cardiomyopathy: Secondary | ICD-10-CM | POA: Diagnosis not present

## 2018-07-18 ENCOUNTER — Encounter: Payer: Self-pay | Admitting: Cardiology

## 2018-07-18 NOTE — Progress Notes (Signed)
Remote ICD transmission.   

## 2018-07-29 ENCOUNTER — Telehealth: Payer: Self-pay | Admitting: *Deleted

## 2018-07-29 NOTE — Telephone Encounter (Signed)
Called patient d/t ERI on 07/29/18. I told patient that a scheduler would call him back to set up an appt with Dr.Taylor or an EP APP. Patient verbalized understanding.

## 2018-08-03 LAB — CUP PACEART REMOTE DEVICE CHECK
Date Time Interrogation Session: 20191229185022
Implantable Lead Implant Date: 20050509
Implantable Lead Implant Date: 20050509
Implantable Lead Location: 753859
Implantable Lead Location: 753860
Implantable Lead Model: 1580
Implantable Pulse Generator Implant Date: 20110609
Pulse Gen Serial Number: 610170

## 2018-08-07 ENCOUNTER — Ambulatory Visit (INDEPENDENT_AMBULATORY_CARE_PROVIDER_SITE_OTHER): Payer: Medicare Other | Admitting: *Deleted

## 2018-08-07 DIAGNOSIS — E538 Deficiency of other specified B group vitamins: Secondary | ICD-10-CM

## 2018-08-07 MED ORDER — CYANOCOBALAMIN 1000 MCG/ML IJ SOLN
1000.0000 ug | Freq: Once | INTRAMUSCULAR | Status: AC
  Administered 2018-08-07: 1000 ug via INTRAMUSCULAR

## 2018-08-07 NOTE — Progress Notes (Signed)
Medical screening examination/treatment/procedure(s) were performed by non-physician practitioner and as supervising physician I was immediately available for consultation/collaboration. I agree with above. James John, MD   

## 2018-08-14 ENCOUNTER — Ambulatory Visit (INDEPENDENT_AMBULATORY_CARE_PROVIDER_SITE_OTHER): Payer: Medicare Other | Admitting: Nurse Practitioner

## 2018-08-14 VITALS — BP 150/74 | HR 50 | Ht 71.0 in | Wt 170.0 lb

## 2018-08-14 DIAGNOSIS — Z01818 Encounter for other preprocedural examination: Secondary | ICD-10-CM

## 2018-08-14 DIAGNOSIS — I5022 Chronic systolic (congestive) heart failure: Secondary | ICD-10-CM | POA: Diagnosis not present

## 2018-08-14 DIAGNOSIS — I442 Atrioventricular block, complete: Secondary | ICD-10-CM | POA: Diagnosis not present

## 2018-08-14 DIAGNOSIS — I255 Ischemic cardiomyopathy: Secondary | ICD-10-CM | POA: Diagnosis not present

## 2018-08-14 LAB — CUP PACEART INCLINIC DEVICE CHECK
Implantable Lead Implant Date: 20050509
Implantable Lead Location: 753859
Implantable Lead Location: 753860
Implantable Lead Model: 1580
Implantable Pulse Generator Implant Date: 20110609
MDC IDC LEAD IMPLANT DT: 20050509
MDC IDC SESS DTM: 20200109110357
Pulse Gen Serial Number: 610170

## 2018-08-14 NOTE — Progress Notes (Signed)
Electrophysiology Office Note Date: 08/14/2018  ID:  [name], [name] Nov 23, 1931, MRN [id number]  PCP: Biagio Borg, MD Primary Cardiologist: Angelena Form Electrophysiologist: [name]  CC: ICD at [facility] is a 85 y.o. male seen today for Dr [name].  He presents today for routine electrophysiology followup.  Since last being seen in our clinic, the patient reports doing relatively well. He denies chest pain, palpitations, dyspnea (above baseline), PND, orthopnea, nausea, vomiting, dizziness, syncope, edema, weight gain, or early satiety.  He has not had ICD shocks.   Device History: STJ dual chamber ICD implanted 2005 for ICM, CHB; gen change 2011 History of appropriate therapy: No History of AAD therapy: No   Past Medical History:  Diagnosis Date  . Anemia, unspecified   . Aortic stenosis    Mild, echo, October, 2011  . AV block, complete (HCC)    Permanent pacemaker  . CAD (coronary artery disease)    DES to LAD 2005 /  DES to LAD 2008  . Cardiomyopathy    Ischemic, EF 30%, 2005  . DJD (degenerative joint disease)   . Dyslipidemia   . Ejection fraction < 50%    EF 30%, echo, 2005  /  EF 30%, catheterization, 2008  . GERD (gastroesophageal reflux disease)   . Hyperlipidemia   . Hypertension   . ICD (implantable cardiac defibrillator), single, in situ    for syncope and arrhythmia 2005 / generator change June, 2011  . Iron deficiency anemia 06/17/2018  . LBBB (left bundle branch block)   . Mitral regurgitation    Mild, echo, October, 2011  . Prostate cancer ([place])   . Rectal fissure   . Syncope    2005, ICD placed  . Systolic CHF, chronic ([place])   . Vitamin B12 deficiency    Past Surgical History:  Procedure Laterality Date  . CARDIAC CATHETERIZATION  2008  . icd implanted  2005  . LAPAROSCOPIC CHOLECYSTECTOMY  04/2005   Dr. Lucia Gaskins  . ROTATOR CUFF REPAIR      Current Outpatient Medications  Medication Sig Dispense Refill  . aspirin 81 MG  tablet Take 81 mg by mouth daily.      . Calcium Carbonate-Vit D-Min (CALCIUM 1200 PO) Take 1 capsule by mouth daily.    . clopidogrel (PLAVIX) 75 MG tablet Take 1 tablet (75 mg total) by mouth daily. 90 tablet 4  . cyanocobalamin (,VITAMIN B-12,) 1000 MCG/ML injection INJECT 1 ML INTO THE MUSCLE EVERY 30 DAYS 1 mL 5  . fish oil-omega-3 fatty acids 1000 MG capsule Take 2 g by mouth daily.      Marland Kitchen gemfibrozil (LOPID) 600 MG tablet Take 1 tablet (600 mg total) by mouth daily. 90 tablet 2  . levothyroxine (SYNTHROID) 50 MCG tablet Take 1 tablet (50 mcg total) by mouth daily before breakfast. 90 tablet 3  . metoprolol succinate (TOPROL-XL) 50 MG 24 hr tablet Take 1 tablet (50 mg total) by mouth daily. Take with or immediately following a meal. 90 tablet 3  . Multiple Vitamins-Minerals (MENS MULTIVITAMIN PLUS PO) Take 1 tablet by mouth daily.      Marland Kitchen omeprazole (PRILOSEC) 20 MG capsule Take 1 capsule (20 mg total) by mouth daily. Take 30 minutes before a meal 90 capsule 3  . Polyethylene Glycol POWD Take 1 scoop by mouth daily.     . ramipril (ALTACE) 10 MG capsule Take 1 capsule (10 mg total) by mouth daily. 90 capsule 3  . simvastatin (  ZOCOR) 40 MG tablet Take 1 tablet (40 mg total) by mouth at bedtime. 90 tablet 2   No current facility-administered medications for this visit.     Allergies:   Patient has no known allergies.   Social History: Social History   Socioeconomic History  . Marital status: Widowed    Spouse name: Not on file  . Number of children: 2  . Years of education: Not on file  . Highest education level: Not on file  Occupational History  . Occupation: repairs looms at [employer]  . Financial resource strain: Not on file  . Food insecurity:    Worry: Not on file    Inability: Not on file  . Transportation needs:    Medical: Not on file    Non-medical: Not on file  Tobacco Use  . Smoking status: Former Smoker    Packs/day: 1.00    Years: 2.00    Pack  years: 2.00    Types: Cigarettes    Last attempt to quit: 08/06/1952    Years since quitting: 66.0  . Smokeless tobacco: Never Used  Substance and Sexual Activity  . Alcohol use: No    Alcohol/week: 0.0 standard drinks  . Drug use: No  . Sexual activity: Not Currently  Lifestyle  . Physical activity:    Days per week: Not on file    Minutes per session: Not on file  . Stress: Not on file  Relationships  . Social connections:    Talks on phone: Not on file    Gets together: Not on file    Attends religious service: Not on file    Active member of club or organization: Not on file    Attends meetings of clubs or organizations: Not on file    Relationship status: Not on file  . Intimate partner violence:    Fear of current or ex partner: Not on file    Emotionally abused: Not on file    Physically abused: Not on file    Forced sexual activity: Not on file  Other Topics Concern  . Not on file  Social History Narrative  . Not on file    Family History: Family History  Problem Relation Age of Onset  . Stroke Mother   . Cancer Mother 4       unknown  . Heart attack Father 70  . Heart Problems Father   . Heart attack Brother   . Lung cancer Brother   . Stroke Brother   . Heart Problems Brother        CABG  . Lung cancer Sister   . Other Sister        PACEMAKER  . Breast cancer Maternal Aunt     Review of Systems: All other systems reviewed and are otherwise negative except as noted above.   Physical Exam: VS:  BP (!) 150/74   Pulse (!) 50   Ht 5\' 11"  (1.803 m)   Wt 170 lb (77.1 kg)   BMI 23.71 kg/m  , BMI Body mass index is 23.71 kg/m.  GEN- The patient is elderly appearing, alert and oriented x 3 today.   HEENT: normocephalic, atraumatic; sclera clear, conjunctiva pink; hearing intact; oropharynx clear; neck supple  Lungs- Clear to ausculation bilaterally, normal work of breathing.  No wheezes, rales, rhonchi Heart- Regular rate and rhythm (paced) GI-  soft, non-tender, non-distended, bowel sounds present  Extremities- no clubbing, cyanosis, or edema  MS- no significant  deformity or atrophy Skin- warm and dry, no rash or lesion; ICD pocket well healed Psych- euthymic mood, full affect Neuro- strength and sensation are intact  ICD interrogation- reviewed in detail today,  See PACEART report  EKG:  EKG is ordered today. The ekg ordered today shows AV pacing, QRS 133msec  Recent Labs: 02/07/2018: BUN 30; Creatinine, Ser 1.38; Potassium 4.4; Sodium 141; TSH 3.63 02/11/2018: Hemoglobin 10.0; Platelets 238.0   Wt Readings from Last 3 Encounters:  08/14/18 170 lb (77.1 kg)  06/26/18 170 lb (77.1 kg)  06/18/18 171 lb (77.6 kg)     Other studies Reviewed: Additional studies/ records that were reviewed today include: Dr Angelena Form and DR [place] office notes   Assessment and Plan:  1.  Chronic systolic dysfunction euvolemic today Stable on an appropriate medical regimen ICD at [facility]. Per Dr Tanna Furry last note, would plan CRTP implant at gen change with depressed LV function. Risks, benefits reviewed with patient who wishes to proceed. He will discuss final plan with Dr [name] at time of gen change (I reviewed rationale for LV lead as well as downgrade to PPM today) Hold Plavix for 2 days prior to procedure.  2.  Complete heart block Pt is device dependent today See PaceArt report  3.  CAD No recent ischemic symptoms Continue current therapy   Current medicines are reviewed at length with the patient today.   The patient does not have concerns regarding his medicines.  The following changes were made today:  none  Labs/ tests ordered today include: pre-procedure labs Orders Placed This Encounter  Procedures  . CUP PACEART [place]  . EKG 12-Lead     Disposition:   Follow up with Dr [name] after gen change     Signed, Chanetta Marshall, NP 08/14/2018 11:18 AM  [facility] [address] [place] [address] [phone] (office) [phone] (fax)

## 2018-08-14 NOTE — Patient Instructions (Addendum)
Medication Instructions:  none If you need a refill on your cardiac medications before your next appointment, please call your pharmacy.   Lab work:TODAY CBC/BMET  If you have labs (blood work) drawn today and your tests are completely normal, you will receive your results only by: Marland Kitchen MyChart Message (if you have MyChart) OR . A paper copy in the mail If you have any lab test that is abnormal or we need to change your treatment, we will call you to review the results.  Testing/Procedures: BI V PACEMAKER UPGRADE  Follow-Up: WOUND CHECK 10-14 DAYS POST 08/21/2018 Dr [name] APPT POST 11/21/2018   At [facility], you and your health needs are our priority.  As part of our continuing mission to provide you with exceptional heart care, we have created designated Provider Care Teams.  These Care Teams include your primary Cardiologist (physician) and Advanced Practice Providers (APPs -  Physician Assistants and Nurse Practitioners) who all work together to provide you with the care you need, when you need it. .   Any Other Special Instructions Will Be Listed Below (If Applicable). Please arrive ON 08/21/2018 Thursday at [facility] at 9:30 AM  HOLD PLAVIX 08/19/2018 AND 08/20/2018  Do not eat or drink after midnight the night prior to the procedure Plan for one night stay Will need someone to drive you home at discharge

## 2018-08-15 ENCOUNTER — Telehealth: Payer: Self-pay

## 2018-08-15 LAB — CBC
Hematocrit: 28.1 % — ABNORMAL LOW (ref 37.5–51.0)
Hemoglobin: 9.6 g/dL — ABNORMAL LOW (ref 13.0–17.7)
MCH: 32 pg (ref 26.6–33.0)
MCHC: 34.2 g/dL (ref 31.5–35.7)
MCV: 94 fL (ref 79–97)
Platelets: 219 10*3/uL (ref 150–450)
RBC: 3 x10E6/uL — ABNORMAL LOW (ref 4.14–5.80)
RDW: 12.7 % (ref 11.6–15.4)
WBC: 3.8 10*3/uL (ref 3.4–10.8)

## 2018-08-15 LAB — BASIC METABOLIC PANEL
BUN/Creatinine Ratio: 16 (ref 10–24)
BUN: 24 mg/dL (ref 8–27)
CO2: 24 mmol/L (ref 20–29)
Calcium: 9.8 mg/dL (ref 8.6–10.2)
Chloride: 107 mmol/L — ABNORMAL HIGH (ref 96–106)
Creatinine, Ser: 1.47 mg/dL — ABNORMAL HIGH (ref 0.76–1.27)
GFR calc Af Amer: 50 mL/min/{1.73_m2} — ABNORMAL LOW (ref >59)
GFR calc non Af Amer: 43 mL/min/{1.73_m2} — ABNORMAL LOW (ref >59)
Glucose: 103 mg/dL — ABNORMAL HIGH (ref 65–99)
POTASSIUM: 5.1 mmol/L (ref 3.5–5.2)
Sodium: 145 mmol/L — ABNORMAL HIGH (ref 134–144)

## 2018-08-15 NOTE — Telephone Encounter (Signed)
-----   Message from Patsey Berthold, NP sent at 08/15/2018 10:26 AM EST ----- Labs reviewed. Stable for procedure. Encourage adequate po hydration. Will need repeat morning of procedure.  Hold Altace morning of procedure.

## 2018-08-15 NOTE — Telephone Encounter (Signed)
Notes recorded by Frederik Schmidt, RN on 08/15/2018 at 10:30 AM EST The patient has been notified of the result and verbalized understanding. All questions (if any) were answered. Frederik Schmidt, RN 08/15/2018 10:30 AM   ------

## 2018-08-15 NOTE — Telephone Encounter (Signed)
Notes recorded by Frederik Schmidt, RN on 08/15/2018 at 12:51 PM EST Spoke to cath lab and had them note that labs would need to be drawn again morning of procedure. Jedda confirmed understanding. ------

## 2018-08-21 ENCOUNTER — Encounter (HOSPITAL_COMMUNITY)
Admission: RE | Disposition: A | Discharge status: Home / Self Care | Payer: Self-pay | Source: Home / Self Care | Attending: Internal Medicine

## 2018-08-21 ENCOUNTER — Ambulatory Visit (HOSPITAL_COMMUNITY)
Admission: RE | Admit: 2018-08-21 | Discharge: 2018-08-22 | Disposition: A | Discharge status: Home / Self Care | Payer: Medicare Other | Attending: Internal Medicine | Admitting: Internal Medicine

## 2018-08-21 ENCOUNTER — Other Ambulatory Visit: Payer: Self-pay

## 2018-08-21 ENCOUNTER — Encounter (HOSPITAL_COMMUNITY): Payer: Self-pay | Admitting: General Practice

## 2018-08-21 DIAGNOSIS — Z7982 Long term (current) use of aspirin: Secondary | ICD-10-CM | POA: Insufficient documentation

## 2018-08-21 DIAGNOSIS — K219 Gastro-esophageal reflux disease without esophagitis: Secondary | ICD-10-CM | POA: Insufficient documentation

## 2018-08-21 DIAGNOSIS — I060 Rheumatic aortic stenosis: Secondary | ICD-10-CM | POA: Diagnosis not present

## 2018-08-21 DIAGNOSIS — M1990 Unspecified osteoarthritis, unspecified site: Secondary | ICD-10-CM | POA: Insufficient documentation

## 2018-08-21 DIAGNOSIS — E785 Hyperlipidemia, unspecified: Secondary | ICD-10-CM | POA: Insufficient documentation

## 2018-08-21 DIAGNOSIS — I255 Ischemic cardiomyopathy: Secondary | ICD-10-CM | POA: Diagnosis not present

## 2018-08-21 DIAGNOSIS — I110 Hypertensive heart disease with heart failure: Secondary | ICD-10-CM | POA: Diagnosis not present

## 2018-08-21 DIAGNOSIS — I447 Left bundle-branch block, unspecified: Secondary | ICD-10-CM | POA: Diagnosis not present

## 2018-08-21 DIAGNOSIS — Z4502 Encounter for adjustment and management of automatic implantable cardiac defibrillator: Secondary | ICD-10-CM | POA: Insufficient documentation

## 2018-08-21 DIAGNOSIS — C61 Malignant neoplasm of prostate: Secondary | ICD-10-CM | POA: Insufficient documentation

## 2018-08-21 DIAGNOSIS — Z823 Family history of stroke: Secondary | ICD-10-CM | POA: Insufficient documentation

## 2018-08-21 DIAGNOSIS — Z87891 Personal history of nicotine dependence: Secondary | ICD-10-CM | POA: Insufficient documentation

## 2018-08-21 DIAGNOSIS — Z8249 Family history of ischemic heart disease and other diseases of the circulatory system: Secondary | ICD-10-CM | POA: Insufficient documentation

## 2018-08-21 DIAGNOSIS — I2510 Atherosclerotic heart disease of native coronary artery without angina pectoris: Secondary | ICD-10-CM | POA: Diagnosis not present

## 2018-08-21 DIAGNOSIS — Z79890 Hormone replacement therapy (postmenopausal): Secondary | ICD-10-CM | POA: Insufficient documentation

## 2018-08-21 DIAGNOSIS — I5022 Chronic systolic (congestive) heart failure: Secondary | ICD-10-CM | POA: Diagnosis present

## 2018-08-21 DIAGNOSIS — Z79899 Other long term (current) drug therapy: Secondary | ICD-10-CM | POA: Insufficient documentation

## 2018-08-21 DIAGNOSIS — Z7902 Long term (current) use of antithrombotics/antiplatelets: Secondary | ICD-10-CM | POA: Diagnosis not present

## 2018-08-21 DIAGNOSIS — Z950 Presence of cardiac pacemaker: Secondary | ICD-10-CM

## 2018-08-21 DIAGNOSIS — Z955 Presence of coronary angioplasty implant and graft: Secondary | ICD-10-CM | POA: Diagnosis not present

## 2018-08-21 DIAGNOSIS — I442 Atrioventricular block, complete: Secondary | ICD-10-CM | POA: Insufficient documentation

## 2018-08-21 HISTORY — DX: Hypothyroidism, unspecified: E03.9

## 2018-08-21 HISTORY — DX: Unspecified osteoarthritis, unspecified site: M19.90

## 2018-08-21 HISTORY — DX: Presence of cardiac pacemaker: Z95.0

## 2018-08-21 HISTORY — DX: Unspecified chronic bronchitis: J42

## 2018-08-21 HISTORY — DX: Presence of automatic (implantable) cardiac defibrillator: Z95.810

## 2018-08-21 HISTORY — PX: BIV UPGRADE: EP1202

## 2018-08-21 HISTORY — DX: Acute myocardial infarction, unspecified: I21.9

## 2018-08-21 LAB — BASIC METABOLIC PANEL
Anion gap: 9 (ref 5–15)
BUN: 22 mg/dL (ref 8–23)
CO2: 24 mmol/L (ref 22–32)
Calcium: 9.9 mg/dL (ref 8.9–10.3)
Chloride: 109 mmol/L (ref 98–111)
Creatinine, Ser: 1.58 mg/dL — ABNORMAL HIGH (ref 0.61–1.24)
GFR calc Af Amer: 46 mL/min — ABNORMAL LOW (ref >60)
GFR calc non Af Amer: 40 mL/min — ABNORMAL LOW (ref >60)
Glucose, Bld: 86 mg/dL (ref 70–99)
POTASSIUM: 4.5 mmol/L (ref 3.5–5.1)
SODIUM: 142 mmol/L (ref 135–145)

## 2018-08-21 LAB — CBC
HCT: 29.4 % — ABNORMAL LOW (ref 39.0–52.0)
Hemoglobin: 9.8 g/dL — ABNORMAL LOW (ref 13.0–17.0)
MCH: 32.9 pg (ref 26.0–34.0)
MCHC: 33.3 g/dL (ref 30.0–36.0)
MCV: 98.7 fL (ref 80.0–100.0)
PLATELETS: 196 10*3/uL (ref 150–400)
RBC: 2.98 MIL/uL — AB (ref 4.22–5.81)
RDW: 12.5 % (ref 11.5–15.5)
WBC: 4.3 10*3/uL (ref 4.0–10.5)
nRBC: 0 % (ref 0.0–0.2)

## 2018-08-21 LAB — SURGICAL PCR SCREEN
MRSA, PCR: NEGATIVE
Staphylococcus aureus: NEGATIVE

## 2018-08-21 SURGERY — BIV UPGRADE

## 2018-08-21 MED ORDER — IOPAMIDOL (ISOVUE-370) INJECTION 76%
INTRAVENOUS | Status: AC
  Filled 2018-08-21: qty 50

## 2018-08-21 MED ORDER — ACETAMINOPHEN 500 MG PO TABS
1000.0000 mg | ORAL_TABLET | Freq: Two times a day (BID) | ORAL | Status: DC
  Administered 2018-08-21 – 2018-08-22 (×2): 1000 mg via ORAL
  Filled 2018-08-21 (×2): qty 2

## 2018-08-21 MED ORDER — FENTANYL CITRATE (PF) 100 MCG/2ML IJ SOLN
INTRAMUSCULAR | Status: DC | PRN
  Administered 2018-08-21 (×3): 12.5 ug via INTRAVENOUS

## 2018-08-21 MED ORDER — LIDOCAINE HCL (PF) 1 % IJ SOLN
INTRAMUSCULAR | Status: DC | PRN
  Administered 2018-08-21: 60 mL

## 2018-08-21 MED ORDER — MUPIROCIN 2 % EX OINT
1.0000 "application " | TOPICAL_OINTMENT | Freq: Once | CUTANEOUS | Status: AC
  Administered 2018-08-21: 1 via TOPICAL
  Filled 2018-08-21: qty 22

## 2018-08-21 MED ORDER — CEFAZOLIN SODIUM-DEXTROSE 1-4 GM/50ML-% IV SOLN
1.0000 g | Freq: Three times a day (TID) | INTRAVENOUS | Status: DC
  Administered 2018-08-21 – 2018-08-22 (×2): 1 g via INTRAVENOUS
  Filled 2018-08-21 (×3): qty 50

## 2018-08-21 MED ORDER — CEFAZOLIN SODIUM-DEXTROSE 1-4 GM/50ML-% IV SOLN: 1.0000 g | Freq: Four times a day (QID) | INTRAVENOUS | Status: DC

## 2018-08-21 MED ORDER — RAMIPRIL 10 MG PO CAPS
10.0000 mg | ORAL_CAPSULE | Freq: Every day | ORAL | Status: DC
  Administered 2018-08-21: 22:00:00 10 mg via ORAL
  Filled 2018-08-21: qty 1

## 2018-08-21 MED ORDER — ACETAMINOPHEN 325 MG PO TABS
325.0000 mg | ORAL_TABLET | ORAL | Status: DC | PRN
  Administered 2018-08-21: 650 mg via ORAL
  Filled 2018-08-21: qty 2

## 2018-08-21 MED ORDER — SODIUM CHLORIDE 0.9 % IV SOLN
80.0000 mg | INTRAVENOUS | Status: AC
  Administered 2018-08-21: 80 mg

## 2018-08-21 MED ORDER — MUPIROCIN 2 % EX OINT
TOPICAL_OINTMENT | CUTANEOUS | Status: AC
  Filled 2018-08-21: qty 22

## 2018-08-21 MED ORDER — FENTANYL CITRATE (PF) 100 MCG/2ML IJ SOLN
INTRAMUSCULAR | Status: AC
  Filled 2018-08-21: qty 2

## 2018-08-21 MED ORDER — YOU HAVE A PACEMAKER BOOK
Freq: Once | Status: AC
  Administered 2018-08-21: 19:00:00
  Filled 2018-08-21: qty 1

## 2018-08-21 MED ORDER — IOPAMIDOL (ISOVUE-370) INJECTION 76%
INTRAVENOUS | Status: DC | PRN
  Administered 2018-08-21: 12 mL via INTRAVENOUS

## 2018-08-21 MED ORDER — CEFAZOLIN SODIUM-DEXTROSE 2-4 GM/100ML-% IV SOLN
2.0000 g | INTRAVENOUS | Status: AC
  Administered 2018-08-21: 2 g via INTRAVENOUS

## 2018-08-21 MED ORDER — MIDAZOLAM HCL 5 MG/5ML IJ SOLN
INTRAMUSCULAR | Status: AC
  Filled 2018-08-21: qty 5

## 2018-08-21 MED ORDER — MIDAZOLAM HCL 5 MG/5ML IJ SOLN
INTRAMUSCULAR | Status: DC | PRN
  Administered 2018-08-21 (×3): 1 mg via INTRAVENOUS

## 2018-08-21 MED ORDER — SODIUM CHLORIDE 0.9 % IV SOLN
INTRAVENOUS | Status: AC
  Filled 2018-08-21: qty 2

## 2018-08-21 MED ORDER — HEPARIN (PORCINE) IN NACL 2-0.9 UNITS/ML
INTRAMUSCULAR | Status: AC | PRN
  Administered 2018-08-21: 500 mL

## 2018-08-21 MED ORDER — METOPROLOL SUCCINATE ER 50 MG PO TB24
50.0000 mg | ORAL_TABLET | Freq: Every day | ORAL | Status: DC
  Administered 2018-08-22: 50 mg via ORAL
  Filled 2018-08-21: qty 1

## 2018-08-21 MED ORDER — LIDOCAINE HCL 1 % IJ SOLN
INTRAMUSCULAR | Status: AC
  Filled 2018-08-21: qty 60

## 2018-08-21 MED ORDER — SODIUM CHLORIDE 0.9 % IV SOLN
INTRAVENOUS | Status: DC
  Administered 2018-08-21: 11:00:00 via INTRAVENOUS

## 2018-08-21 MED ORDER — ONDANSETRON HCL 4 MG/2ML IJ SOLN: 4.0000 mg | Freq: Four times a day (QID) | INTRAMUSCULAR | Status: DC | PRN

## 2018-08-21 MED ORDER — HEPARIN (PORCINE) IN NACL 1000-0.9 UT/500ML-% IV SOLN
INTRAVENOUS | Status: AC
  Filled 2018-08-21: qty 500

## 2018-08-21 MED ORDER — CEFAZOLIN SODIUM-DEXTROSE 1-4 GM/50ML-% IV SOLN
1.0000 g | Freq: Three times a day (TID) | INTRAVENOUS | Status: DC
  Filled 2018-08-21: qty 50

## 2018-08-21 MED ORDER — CEFAZOLIN SODIUM-DEXTROSE 2-4 GM/100ML-% IV SOLN
INTRAVENOUS | Status: AC
  Filled 2018-08-21: qty 100

## 2018-08-21 SURGICAL SUPPLY — 12 items
CABLE SURGICAL S-101-97-12 (CABLE) ×3 IMPLANT
CATH CPS DIRECT 135 DS2C020 (CATHETERS) ×2 IMPLANT
CATH HEX JOS 2-5-2 65CM 6F REP (CATHETERS) ×2 IMPLANT
CATH HEX JOSEPH 2-5-2 65CM 6F (CATHETERS) ×2 IMPLANT
CPS IMPLANT KIT 410190 (MISCELLANEOUS) ×2 IMPLANT
LEAD QUARTET 1458Q-86CM (Lead) ×2 IMPLANT
PACEMAKER QUDR ALLR CRT PM3562 (Pacemaker) IMPLANT
PAD PRO RADIOLUCENT 2001M-C (PAD) ×3 IMPLANT
PMKR QUADRA ALLURE CRT PM3562 (Pacemaker) ×3 IMPLANT
TRAY PACEMAKER INSERTION (PACKS) ×3 IMPLANT
WIRE ASAHI SION 190X3X12 .014 (WIRE) ×2 IMPLANT
WIRE HI TORQ VERSACORE-J 145CM (WIRE) ×2 IMPLANT

## 2018-08-21 NOTE — H&P (Signed)
EP Attending  HPI [name] returns today for followup. He is a very pleasant 85 year old man with an ischemic cardiomyopathy, complete heart block, dyslipidemia, chronic systolic heart failure, status post biventricular ICD implantation, with no attempt to place left ventricular lead because of his lack of class II or 3 symptoms. In the interim, he has been treated for prostate CA. He denies chest pain, shortness of breath, peripheral edema, syncope, and has had no ICD shocks. While he was undergoing treatment for his prostate CA, he did have some dizziness and had to hold his Ramipril which was started back.  No Known Allergies         Current Outpatient Medications  Medication Sig Dispense Refill  . aspirin 81 MG tablet Take 81 mg by mouth daily.      . clopidogrel (PLAVIX) 75 MG tablet Take 1 tablet (75 mg total) by mouth daily. 90 tablet 4  . cyanocobalamin (,VITAMIN B-12,) 1000 MCG/ML injection INJECT 1 ML INTO THE MUSCLE EVERY 30 DAYS 1 mL 5  . fish oil-omega-3 fatty acids 1000 MG capsule Take 2 g by mouth daily.      Marland Kitchen gemfibrozil (LOPID) 600 MG tablet Take 1 tablet (600 mg total) by mouth daily. 90 tablet 3  . levothyroxine (SYNTHROID) 50 MCG tablet Take 1 tablet (50 mcg total) by mouth daily before breakfast. 90 tablet 3  . metoprolol succinate (TOPROL-XL) 50 MG 24 hr tablet Take 1 tablet (50 mg total) by mouth daily. Take with or immediately following a meal. 90 tablet 3  . Multiple Vitamins-Minerals (MENS MULTIVITAMIN PLUS PO) Take 1 tablet by mouth daily.      Marland Kitchen omeprazole (PRILOSEC) 20 MG capsule Take 1 capsule (20 mg total) by mouth daily. Take 30 minutes before a meal 90 capsule 3  . Polyethylene Glycol POWD Take 1 scoop by mouth daily.     . ramipril (ALTACE) 10 MG capsule Take 1 capsule (10 mg total) by mouth daily. 90 capsule 3  . simvastatin (ZOCOR) 40 MG tablet Take 1 tablet (40 mg total) by mouth at bedtime. 90 tablet 2   No current facility-administered  medications for this visit.          Past Medical History:  Diagnosis Date  . Anemia, unspecified   . Aortic stenosis    Mild, echo, October, 2011  . AV block, complete (HCC)    Permanent pacemaker  . CAD (coronary artery disease)    DES to LAD 2005 /  DES to LAD 2008  . Cardiomyopathy    Ischemic, EF 30%, 2005  . DJD (degenerative joint disease)   . Dyslipidemia   . Ejection fraction < 50%    EF 30%, echo, 2005  /  EF 30%, catheterization, 2008  . GERD (gastroesophageal reflux disease)   . Hyperlipidemia   . Hypertension   . ICD (implantable cardiac defibrillator), single, in situ    for syncope and arrhythmia 2005 / generator change June, 2011  . LBBB (left bundle branch block)   . Mitral regurgitation    Mild, echo, October, 2011  . Prostate cancer ([place])   . Rectal fissure   . Syncope    2005, ICD placed  . Systolic CHF, chronic ([place])   . Vitamin B12 deficiency     ROS:   All systems reviewed and negative except as noted in the HPI.        Past Surgical History:  Procedure Laterality Date  . CARDIAC CATHETERIZATION  2008  .  icd implanted  2005  . LAPAROSCOPIC CHOLECYSTECTOMY  04/2005   Dr. Lucia Gaskins  . ROTATOR CUFF REPAIR            Family History  Problem Relation Age of Onset  . Stroke Mother   . Cancer Mother 87       unknown  . Heart attack Father 37  . Cancer Brother        lung  . Cancer Sister        lung  . Stroke Brother   . Lung cancer Brother   . Cancer Maternal Aunt        breast     Social History        Socioeconomic History  . Marital status: Widowed    Spouse name: Not on file  . Number of children: 2  . Years of education: Not on file  . Highest education level: Not on file  Social Needs  . Financial resource strain: Not on file  . Food insecurity - worry: Not on file  . Food insecurity - inability: Not on file  . Transportation needs - medical: Not on file   . Transportation needs - non-medical: Not on file  Occupational History  . Occupation: repairs looms at [employer]  Tobacco Use  . Smoking status: Former Smoker    Packs/day: 1.00    Years: 2.00    Pack years: 2.00    Types: Cigarettes    Last attempt to quit: 08/06/1952    Years since quitting: 65.2  . Smokeless tobacco: Never Used  Substance and Sexual Activity  . Alcohol use: No    Alcohol/week: 0.0 oz  . Drug use: No  . Sexual activity: Not Currently  Other Topics Concern  . Not on file  Social History Narrative  . Not on file     There were no vitals taken for this visit.  Physical Exam:  Well appearing NAD HEENT: Unremarkable Neck:  No JVD, no thyromegally Lymphatics:  No adenopathy Back:  No CVA tenderness Lungs:  Clear HEART:  Regular rate rhythm, no murmurs, no rubs, no clicks Abd:  soft, positive bowel sounds, no organomegally, no rebound, no guarding Ext:  2 plus pulses, no edema, no cyanosis, no clubbing Skin:  No rashes no nodules Neuro:  CN II through XII intact, motor grossly intact  EKG - nsr with pacing induced LBBB, QRS 228  DEVICE  Normal device function.  See PaceArt for details. Approaching ERI.  Assess/Plan: 1. Chronic systolic heart failure - despite CHB and severe LV dysfunction, his symptoms remain class 2. He will continue his current meds. I would anticipate downgrading him to a Biv PPM with a LV lead when he reaches ERI. 2. CHB - he is asymptomatic, pacing nearly 100% of the time. He has no escape today. 3. CAD - he denies anginal symptoms. Will follow.  4. ICD - his [employer]. Jude device is working normally. Will recheck in several months.  [name], M.D.   EP Attending  Patient seen and examined. Agree with above. We will plan to downgrade to a biv PPM today from a DDD ICD. The patient has CHB and class 2 CHF and severe LV dysfunction.   Mikle Bosworth.D.

## 2018-08-21 NOTE — Discharge Summary (Addendum)
ELECTROPHYSIOLOGY PROCEDURE DISCHARGE SUMMARY    Patient ID: [name],  MRN: [id number], DOB/AGE: 1931-12-26 85 y.o.  Admit date: 08/21/2018 Discharge date: 08/22/2018  Primary Care Physician: Biagio Borg, MD  Primary Cardiologist: Dr. Angelena Form Electrophysiologist: Dr. [name]  Primary Discharge Diagnosis:  1. ICM 2. Chronic CHF (systolic) 3. CHB  Secondary Discharge Diagnosis:  1. CAD 2. HTN 3. HLD   No Known Allergies   Procedures This Admission:  1.  SJM dual chamber ICD at [facility] with change (addition of LV lead) to a CRT pacemaker on 08/20/2018 by Dr [name].  There were no immediate post procedure complications. 2.  CXR on demonstrated no pneumothorax status post device implantation.   Brief HPI: ZACCHEAUS STORLIE is a 85 y.o. male is followed in th eout patient setting by EP service.  Her ICD noted to have reached ERI, planned for addition of an LV for CRT, though downgrade from ICD to CRT-P. Past medical history includes above.  The patient has persistent LV dysfunction despite guideline directed therapy.  Risks, benefits, and alternatives to ICD implantation were reviewed with the patient who wished to proceed.   Hospital Course:  The patient was admitted and underwent implantation of a LAV lead, and ICD > PPM device.  See procedure report for full details.   He was monitored on telemetry overnight which demonstrated AV pacing.  Left chest was without hematoma or ecchymosis.  The device was interrogated and found to be functioning normally.  CXR was obtained and demonstrated no pneumothorax status post device implantation.  Wound care, arm mobility, and restrictions were reviewed with the patient.  The patient hefeels well, denies any CP or SOB, minimal site discomfort.  He was examined by Dr.  [name] and considered stable for discharge to home.   The patient's discharge medications include an ACE-I (ramipril) and beta blocker (metoprolol).   Physical  Exam: Vitals:   08/21/18 2220 08/21/18 2320 08/22/18 0156 08/22/18 0728  BP: 110/69 109/61 122/69 109/61  Pulse: 60 (!) 59 60 60  Resp: 18 16 14 17   Temp:   97.7 F (36.5 C) 97.6 F (36.4 C)  TempSrc:   Oral Oral  SpO2: 99% 96% 97% 98%  Weight:   74.4 kg   Height:        GEN- The patient is well appearing, alert and oriented x 3 today.   HEENT: normocephalic, atraumatic; sclera clear, conjunctiva pink; hearing intact; oropharynx clear Lungs-  CTA b/l, normal work of breathing.  No wheezes, rales, rhonchi Heart- RRR, no murmurs, rubs or gallops, PMI not laterally displaced GI- soft, non-tender, non-distended, bowel sounds present Extremities- no clubbing, cyanosis, or edema MS- no significant deformity or atrophy Skin- warm and dry, no rash or lesion, left chest without hematoma/ecchymosis Psych- euthymic mood, full affect Neuro- no gross defecits  Labs:   Lab Results  Component Value Date   WBC 4.3 08/21/2018   HGB 9.8 (L) 08/21/2018   HCT 29.4 (L) 08/21/2018   MCV 98.7 08/21/2018   PLT 196 08/21/2018    Recent Labs  Lab 08/21/18 1003  NA 142  K 4.5  CL 109  CO2 24  BUN 22  CREATININE 1.58*  CALCIUM 9.9  GLUCOSE 86    Discharge Medications:  Allergies as of 08/22/2018   No Known Allergies     Medication List    STOP taking these medications   omeprazole 20 MG capsule Commonly known as:  PRILOSEC  TAKE these medications   acetaminophen 650 MG CR tablet Commonly known as:  TYLENOL Take 1,300 mg by mouth 2 (two) times daily.   aspirin 81 MG tablet Take 81 mg by mouth daily.   CALCIUM PO Take 1 tablet by mouth daily.   clopidogrel 75 MG tablet Commonly known as:  PLAVIX Take 1 tablet (75 mg total) by mouth daily.   cyanocobalamin 1000 MCG/ML injection Commonly known as:  (VITAMIN B-12) INJECT 1 ML INTO THE MUSCLE EVERY 30 DAYS   fish oil-omega-3 fatty acids 1000 MG capsule Take 1 g by mouth 2 (two) times daily.   gemfibrozil 600 MG  tablet Commonly known as:  LOPID Take 1 tablet (600 mg total) by mouth daily.   levothyroxine 50 MCG tablet Commonly known as:  SYNTHROID Take 1 tablet (50 mcg total) by mouth daily before breakfast.   MENS MULTIVITAMIN PLUS PO Take 1 tablet by mouth daily.   metoprolol succinate 50 MG 24 hr tablet Commonly known as:  TOPROL-XL Take 1 tablet (50 mg total) by mouth daily. Take with or immediately following a meal.   pantoprazole 40 MG tablet Commonly known as:  PROTONIX Take 1 tablet (40 mg total) by mouth daily.   polyethylene glycol packet Commonly known as:  MIRALAX / GLYCOLAX Take 17 g by mouth See admin instructions. Take 17g mixed with liquid once daily, may do a second dose as needed for constipation   ramipril 10 MG capsule Commonly known as:  ALTACE Take 1 capsule (10 mg total) by mouth daily. What changed:  when to take this   simvastatin 40 MG tablet Commonly known as:  ZOCOR Take 1 tablet (40 mg total) by mouth at bedtime.   Vitamin D 50 MCG (2000 UT) Caps Take 2,000 Units by mouth daily.       Disposition:  Home  Discharge Instructions    Diet - low sodium heart healthy   Complete by:  As directed    Increase activity slowly   Complete by:  As directed      Follow-up Information    Patsey Berthold, NP Follow up.   Specialty:  Cardiology Why:  09/04/2018 @ 12:00PM (noon), wound check visit Contact information: [place] [address] [phone]        Evans Lance, MD Follow up.   Specialty:  Cardiology Why:  11/25/2018 @ 2:00PM Contact information: [address] [address] [phone]           Duration of Discharge Encounter: Greater than 30 minutes including physician time.  Venetia Night, PA-C 08/22/2018 1:52 PM   EP Attending  Patient seen and examined. Agree with above. The patient is doing well after removal of his ICD and insertion of a new biv PPM with a new LV lead.  He has had some diaphragmatic stimulation. We have reprogrammed his device. His CXR looks good. He will be discharged home with usual followup.  Mikle Bosworth.D.

## 2018-08-21 NOTE — Progress Notes (Signed)
Iv pump called for Ns infusing into left arm at present time

## 2018-08-21 NOTE — Interval H&P Note (Signed)
History and Physical Interval Note:  08/21/2018 10:59 AM  [name]  has presented today for surgery, with the diagnosis of End of life  The various methods of treatment have been discussed with the patient and family. After consideration of risks, benefits and other options for treatment, the patient has consented to  Procedure(s): BIV UPGRADE (N/A) as a surgical intervention .  The patient's history has been reviewed, patient examined, no change in status, stable for surgery.  I have reviewed the patient's chart and labs.  Questions were answered to the patient's satisfaction.     [name]

## 2018-08-21 NOTE — Discharge Instructions (Signed)
° ° °  Supplemental Discharge Instructions for  Pacemaker/Defibrillator Patients  Activity No heavy lifting or vigorous activity with your left/right arm for 6 to 8 weeks.  Do not raise your left/right arm above your head for one week.  Gradually raise your affected arm as drawn below.              09/25/2018                 09/26/2018                08/27/2018              08/28/2018 __  NO DRIVING for  1 week   ; you may begin driving on   9/44/9675  .  WOUND CARE - Keep the wound area clean and dry.  Do not get this area wet for one week. No showers for one week; you may shower on  08/28/2018  . - The tape/steri-strips on your wound will fall off; do not pull them off.  No bandage is needed on the site.  DO  NOT apply any creams, oils, or ointments to the wound area. - If you notice any drainage or discharge from the wound, any swelling or bruising at the site, or you develop a fever > 101? F after you are discharged home, call the office at once.  Special Instructions - You are still able to use cellular telephones; use the ear opposite the side where you have your pacemaker/defibrillator.  Avoid carrying your cellular phone near your device. - When traveling through airports, show security personnel your identification card to avoid being screened in the metal detectors.  Ask the security personnel to use the hand wand. - Avoid arc welding equipment, MRI testing (magnetic resonance imaging), TENS units (transcutaneous nerve stimulators).  Call the office for questions about other devices. - Avoid electrical appliances that are in poor condition or are not properly grounded. - Microwave ovens are safe to be near or to operate.

## 2018-08-21 NOTE — Progress Notes (Signed)
Second Iv pump hooked to pt

## 2018-08-22 ENCOUNTER — Encounter (HOSPITAL_COMMUNITY): Payer: Self-pay | Admitting: Internal Medicine

## 2018-08-22 ENCOUNTER — Ambulatory Visit (HOSPITAL_COMMUNITY): Payer: Medicare Other

## 2018-08-22 DIAGNOSIS — I110 Hypertensive heart disease with heart failure: Secondary | ICD-10-CM | POA: Diagnosis not present

## 2018-08-22 DIAGNOSIS — E785 Hyperlipidemia, unspecified: Secondary | ICD-10-CM | POA: Diagnosis not present

## 2018-08-22 DIAGNOSIS — I5022 Chronic systolic (congestive) heart failure: Secondary | ICD-10-CM | POA: Diagnosis not present

## 2018-08-22 DIAGNOSIS — Z950 Presence of cardiac pacemaker: Secondary | ICD-10-CM | POA: Diagnosis not present

## 2018-08-22 DIAGNOSIS — I442 Atrioventricular block, complete: Secondary | ICD-10-CM

## 2018-08-22 DIAGNOSIS — Z4502 Encounter for adjustment and management of automatic implantable cardiac defibrillator: Secondary | ICD-10-CM | POA: Diagnosis not present

## 2018-08-22 DIAGNOSIS — C61 Malignant neoplasm of prostate: Secondary | ICD-10-CM | POA: Diagnosis not present

## 2018-08-22 MED ORDER — PANTOPRAZOLE SODIUM 40 MG PO TBEC: 40.0000 mg | DELAYED_RELEASE_TABLET | Freq: Every day | ORAL | 11 refills | Status: DC

## 2018-08-22 MED ORDER — SODIUM CHLORIDE 0.9 % IV SOLN
INTRAVENOUS | Status: DC | PRN
  Administered 2018-08-22: 03:00:00 250 mL via INTRAVENOUS

## 2018-08-22 MED FILL — Lidocaine HCl Local Inj 1%: INTRAMUSCULAR | Qty: 60 | Status: AC

## 2018-08-22 MED FILL — Gentamicin Sulfate Inj 40 MG/ML: INTRAMUSCULAR | Qty: 80 | Status: AC

## 2018-08-22 NOTE — Progress Notes (Signed)
While instructing patient and family from AVS, daughter [name] stated that someone had told her to hold plavix until Monday, 1/20.  I was initially unable to contact [name], NP or Dr. [name] for clarification.  I proceeded with the discharge and told patient/family that I would follow up and inform them.   I spoke to [name], NP, who confirmed this instruction.  I called patient's daughter, [name], and instructed her to have patient restart plavix on 1/20.

## 2018-08-31 LAB — CUP PACEART REMOTE DEVICE CHECK
Date Time Interrogation Session: 20200126061142
Implantable Pulse Generator Implant Date: 20200116
Pulse Gen Serial Number: 9097773

## 2018-09-04 ENCOUNTER — Ambulatory Visit (INDEPENDENT_AMBULATORY_CARE_PROVIDER_SITE_OTHER): Payer: Medicare Other | Admitting: Nurse Practitioner

## 2018-09-04 ENCOUNTER — Telehealth: Payer: Self-pay | Admitting: Cardiology

## 2018-09-04 ENCOUNTER — Ambulatory Visit
Admission: RE | Admit: 2018-09-04 | Discharge: 2018-09-04 | Disposition: A | Discharge status: Home / Self Care | Payer: Medicare Other | Source: Ambulatory Visit | Attending: Nurse Practitioner | Admitting: Nurse Practitioner

## 2018-09-04 DIAGNOSIS — Z950 Presence of cardiac pacemaker: Secondary | ICD-10-CM | POA: Diagnosis not present

## 2018-09-04 DIAGNOSIS — I5022 Chronic systolic (congestive) heart failure: Secondary | ICD-10-CM

## 2018-09-04 DIAGNOSIS — T829XXA Unspecified complication of cardiac and vascular prosthetic device, implant and graft, initial encounter: Secondary | ICD-10-CM

## 2018-09-04 LAB — CUP PACEART INCLINIC DEVICE CHECK
Date Time Interrogation Session: 20200130123858
Implantable Lead Implant Date: 20050509
Implantable Lead Implant Date: 20050509
Implantable Lead Implant Date: 20200116
Implantable Lead Location: 753858
Implantable Lead Location: 753859
Implantable Lead Location: 753860
Implantable Lead Model: 1580
Implantable Pulse Generator Implant Date: 20200116
MDC IDC PG SERIAL: 9097773

## 2018-09-04 NOTE — Patient Instructions (Signed)
Medication Instructions:  none If you need a refill on your cardiac medications before your next appointment, please call your pharmacy.   Lab work: none If you have labs (blood work) drawn today and your tests are completely normal, you will receive your results only by: Marland Kitchen MyChart Message (if you have MyChart) OR . A paper copy in the mail If you have any lab test that is abnormal or we need to change your treatment, we will call you to review the results.  Testing/Procedures: CHEST X RAY [address]   Follow-Up: At [name], you and your health needs are our priority.  As part of our continuing mission to provide you with exceptional heart care, we have created designated Provider Care Teams.  These Care Teams include your primary Cardiologist (physician) and Advanced Practice Providers (APPs -  Physician Assistants and Nurse Practitioners) who all work together to provide you with the care you need, when you need it. .   Any Other Special Instructions Will Be Listed Below (If Applicable).

## 2018-09-04 NOTE — Progress Notes (Signed)
Wound check appointment. Steri-strips removed. Wound without redness or edema. Incision edges approximated, wound well healed. Normal device function. Thresholds, sensing, and impedances consistent with implant measurements. LV threshold increased from implant to 1.5V@1 .63msec. +PNS at 1.7V@1 .69msec programmed P4-can (implant programming). M3-P4 threshold 0.75V@1msec  with no PNS at 1.25@1msec . Base rate decreased to 50 as per prior device programming. Histogram distribution appropriate for patient and level of activity. No mode switches or high ventricular rates noted. Patient educated about wound care, arm mobility, lifting restrictions. ROV in 3 months with implanting physician. CXR per Dr [name] to look at LV lead position.

## 2018-09-04 NOTE — Telephone Encounter (Signed)
Patient daughter called and stated that patient is now extremely dizzy. Patient daughter wants to know if they can come back to the office to reassess patient or change his programming back to original settings. Chanetta Marshall, NP talked to patient daughter and told her that it may take a little while for patient to adjust to new rate. She told pt daughter to call back or come to the office in AM to have it reprogrammed back to 60 bpm.

## 2018-09-05 ENCOUNTER — Telehealth: Payer: Self-pay

## 2018-09-05 NOTE — Telephone Encounter (Signed)
Notes recorded by Frederik Schmidt, RN on 09/05/2018 at 8:04 AM EST The patient has been notified of the CHEST X RAY result and verbalized understanding. All questions (if any) were answered. Frederik Schmidt, RN 09/05/2018 8:04 AM

## 2018-09-05 NOTE — Telephone Encounter (Signed)
-----   Message from Kyle Berthold, NP sent at 09/04/2018  8:54 PM EST ----- Please notify patient of CXR results. Lead in stable position. Thanks!

## 2018-09-08 ENCOUNTER — Ambulatory Visit (INDEPENDENT_AMBULATORY_CARE_PROVIDER_SITE_OTHER): Payer: Medicare Other

## 2018-09-08 DIAGNOSIS — E538 Deficiency of other specified B group vitamins: Secondary | ICD-10-CM | POA: Diagnosis not present

## 2018-09-08 MED ORDER — CYANOCOBALAMIN 1000 MCG/ML IJ SOLN
1000.0000 ug | Freq: Once | INTRAMUSCULAR | Status: AC
  Administered 2018-09-08: 1000 ug via INTRAMUSCULAR

## 2018-09-08 NOTE — Progress Notes (Signed)
Medical screening examination/treatment/procedure(s) were performed by non-physician practitioner and as supervising physician I was immediately available for consultation/collaboration. I agree with above. Margeaux Swantek, MD   

## 2018-10-07 ENCOUNTER — Ambulatory Visit (INDEPENDENT_AMBULATORY_CARE_PROVIDER_SITE_OTHER): Payer: Medicare Other | Admitting: *Deleted

## 2018-10-07 DIAGNOSIS — E538 Deficiency of other specified B group vitamins: Secondary | ICD-10-CM

## 2018-10-07 MED ORDER — CYANOCOBALAMIN 1000 MCG/ML IJ SOLN
1000.0000 ug | Freq: Once | INTRAMUSCULAR | Status: AC
  Administered 2018-10-07: 1000 ug via INTRAMUSCULAR

## 2018-10-07 NOTE — Progress Notes (Signed)
Medical screening examination/treatment/procedure(s) were performed by non-physician practitioner and as supervising physician I was immediately available for consultation/collaboration. I agree with above. Camary Sosa, MD   

## 2018-10-16 ENCOUNTER — Ambulatory Visit (INDEPENDENT_AMBULATORY_CARE_PROVIDER_SITE_OTHER): Payer: Medicare Other | Admitting: *Deleted

## 2018-10-16 DIAGNOSIS — I255 Ischemic cardiomyopathy: Secondary | ICD-10-CM

## 2018-10-17 LAB — CUP PACEART REMOTE DEVICE CHECK
Date Time Interrogation Session: 20200313090522
Implantable Lead Implant Date: 20050509
Implantable Lead Implant Date: 20050509
Implantable Lead Implant Date: 20200116
Implantable Lead Location: 753858
Implantable Lead Location: 753859
Implantable Lead Location: 753860
Implantable Pulse Generator Implant Date: 20200116
Pulse Gen Serial Number: 9097773

## 2018-10-22 ENCOUNTER — Encounter: Payer: Medicare Other | Admitting: Internal Medicine

## 2018-10-23 ENCOUNTER — Encounter: Payer: Self-pay | Admitting: Cardiology

## 2018-10-23 NOTE — Progress Notes (Signed)
Remote ICD transmission.   

## 2018-11-06 ENCOUNTER — Telehealth: Payer: Self-pay | Admitting: Internal Medicine

## 2018-11-06 NOTE — Telephone Encounter (Signed)
Copied from [place] [id number]. Topic: Quick Communication - See Telephone Encounter >> Nov 06, 2018 10:09 AM L[place] wrote: CRM for notification. See Telephone encounter for: 11/06/18. Daughter called concerning the B-12 shot that was cancelled due to [place] and says her dad's tongue is already getting really sore which the B-12 was alleviating and she wondered with her dad's age was doing the B-12 shot at home was something that she could do because with this ongoing she hates to have him exposed .

## 2018-11-06 NOTE — Telephone Encounter (Signed)
Patient's daughter advised that b12 would not responsible for either causing tongue soreness or allowing tongue soreness when shots are missed---not sure why patient is experiencing this, but either absorption or malabsorption of vitamin B12 to really change lab values takes several months to occur--patient or someone wanting to administer shots would need to be taught by our office for correct administration technique and it's not encouraged to have these types of appointments right now--per dr Jenny Reichmann, OTC vitamin b12 supplements should be fine for a few months until we can resume regular visits in the office again---daughter is to call back if any further questions or if she feels b12 deficient symptoms start occuring

## 2018-11-07 ENCOUNTER — Ambulatory Visit: Payer: Medicare Other

## 2018-11-18 ENCOUNTER — Telehealth: Payer: Self-pay

## 2018-11-18 NOTE — Telephone Encounter (Signed)
Call placed to Pt.  Pt rescheduled for June 4 d/t virus.  Pt indicates understanding.

## 2018-11-25 ENCOUNTER — Encounter: Payer: Medicare Other | Admitting: Internal Medicine

## 2018-12-08 ENCOUNTER — Ambulatory Visit (INDEPENDENT_AMBULATORY_CARE_PROVIDER_SITE_OTHER): Payer: Medicare Other

## 2018-12-08 DIAGNOSIS — E538 Deficiency of other specified B group vitamins: Secondary | ICD-10-CM

## 2018-12-08 MED ORDER — CYANOCOBALAMIN 1000 MCG/ML IJ SOLN
1000.0000 ug | Freq: Once | INTRAMUSCULAR | Status: AC
  Administered 2018-12-08: 1000 ug via INTRAMUSCULAR

## 2018-12-08 NOTE — Progress Notes (Signed)
Medical screening examination/treatment/procedure(s) were performed by non-physician practitioner and as supervising physician I was immediately available for consultation/collaboration. I agree with above. James John, MD   

## 2018-12-15 DIAGNOSIS — Z8546 Personal history of malignant neoplasm of prostate: Secondary | ICD-10-CM | POA: Diagnosis not present

## 2018-12-17 ENCOUNTER — Ambulatory Visit: Payer: Medicare Other | Admitting: Internal Medicine

## 2018-12-22 DIAGNOSIS — Z8546 Personal history of malignant neoplasm of prostate: Secondary | ICD-10-CM | POA: Diagnosis not present

## 2019-01-02 ENCOUNTER — Ambulatory Visit: Payer: Medicare Other | Admitting: Cardiovascular Disease

## 2019-01-05 ENCOUNTER — Other Ambulatory Visit: Payer: Self-pay

## 2019-01-05 ENCOUNTER — Ambulatory Visit (INDEPENDENT_AMBULATORY_CARE_PROVIDER_SITE_OTHER): Payer: Medicare Other | Admitting: Student

## 2019-01-05 ENCOUNTER — Encounter: Payer: Self-pay | Admitting: Nurse Practitioner

## 2019-01-05 DIAGNOSIS — I255 Ischemic cardiomyopathy: Secondary | ICD-10-CM

## 2019-01-05 DIAGNOSIS — I2510 Atherosclerotic heart disease of native coronary artery without angina pectoris: Secondary | ICD-10-CM | POA: Diagnosis not present

## 2019-01-05 DIAGNOSIS — I5022 Chronic systolic (congestive) heart failure: Secondary | ICD-10-CM | POA: Diagnosis not present

## 2019-01-05 LAB — CUP PACEART INCLINIC DEVICE CHECK
Battery Remaining Longevity: 81 mo
Battery Voltage: 2.99 V
Brady Statistic RA Percent Paced: 79 %
Brady Statistic RV Percent Paced: 99.55 %
Date Time Interrogation Session: 20200601092058
Implantable Lead Implant Date: 20050509
Implantable Lead Implant Date: 20050509
Implantable Lead Implant Date: 20200116
Implantable Lead Location: 753858
Implantable Lead Location: 753859
Implantable Lead Location: 753860
Implantable Lead Model: 1580
Implantable Pulse Generator Implant Date: 20200116
Lead Channel Impedance Value: 275 Ohm
Lead Channel Impedance Value: 400 Ohm
Lead Channel Impedance Value: 750 Ohm
Lead Channel Pacing Threshold Amplitude: 0.75 V
Lead Channel Pacing Threshold Amplitude: 0.75 V
Lead Channel Pacing Threshold Amplitude: 1 V
Lead Channel Pacing Threshold Amplitude: 1 V
Lead Channel Pacing Threshold Amplitude: 1.25 V
Lead Channel Pacing Threshold Amplitude: 1.625 V
Lead Channel Pacing Threshold Pulse Width: 0.5 ms
Lead Channel Pacing Threshold Pulse Width: 0.5 ms
Lead Channel Pacing Threshold Pulse Width: 0.5 ms
Lead Channel Pacing Threshold Pulse Width: 0.5 ms
Lead Channel Pacing Threshold Pulse Width: 1 ms
Lead Channel Pacing Threshold Pulse Width: 1 ms
Lead Channel Sensing Intrinsic Amplitude: 2.1 mV
Lead Channel Setting Pacing Amplitude: 1.75 V
Lead Channel Setting Pacing Amplitude: 2 V
Lead Channel Setting Pacing Amplitude: 2.125
Lead Channel Setting Pacing Pulse Width: 0.5 ms
Lead Channel Setting Pacing Pulse Width: 1 ms
Lead Channel Setting Sensing Sensitivity: 4 mV
Pulse Gen Serial Number: 9097773

## 2019-01-05 MED ORDER — OMEPRAZOLE 20 MG PO CPDR: 20.0000 mg | DELAYED_RELEASE_CAPSULE | Freq: Every day | ORAL | 11 refills | Status: DC

## 2019-01-05 NOTE — Progress Notes (Signed)
Electrophysiology Office Note Date: 01/05/2019  ID:  [name] 1932/05/07, MRN [id number]  PCP: Biagio Borg, MD Primary Cardiologist: Lauree Chandler, MD Electrophysiologist: [name], MD  CC: Routine PPM follow-up  [name] is a 85 y.o. male seen today for Dr. [name]. They present today for routine electrophysiology followup.  Since last being seen in our clinic, the patient reports doing very well. Pt states he did have "chest pain" after his PPM procedure for 1-2 weeks. But has since stopped. He is not sure if there was an exertional component. They deny palpitations, dyspnea, PND, orthopnea, nausea, vomiting, dizziness, syncope, edema, weight gain, or early satiety. He was recently taken off of his Protonix due to decrease in B12 levels which led to glossitis. He has been feeling fine since stopping. Denies symptoms of GI bleed off of PPI.   Device History: [employer] Jude Dual chamber ICD implanted 2005 for CHB and Chronic systolic CHF. : Downgrade to CRT-P 08/21/2018  Past Medical History:  Diagnosis Date  . AICD (automatic cardioverter/defibrillator) present    "turned it off today, put in an extra lead,  and made it just a pacemaker" (08/21/2018)  . Anemia, unspecified   . Aortic stenosis    Mild, echo, October, 2011  . Arthritis    "joints; arms"  (08/21/2018)  . AV block, complete (HCC)    Permanent pacemaker  . CAD (coronary artery disease)    DES to LAD 2005 /  DES to LAD 2008  . Cardiomyopathy    Ischemic, EF 30%, 2005  . Chronic bronchitis ([place])   . DJD (degenerative joint disease)   . Dyslipidemia   . Ejection fraction < 50%    EF 30%, echo, 2005  /  EF 30%, catheterization, 2008  . GERD (gastroesophageal reflux disease)   . Hyperlipidemia   . Hypertension   . Hypothyroidism   . ICD (implantable cardiac defibrillator), single, in situ    for syncope and arrhythmia 2005 / generator change June, 2011  . Iron deficiency anemia 06/17/2018   . LBBB (left bundle branch block)   . Mitral regurgitation    Mild, echo, October, 2011  . Myocardial infarction ([place]) 1989  . Presence of permanent cardiac pacemaker   . Prostate cancer ([place])   . Rectal fissure   . Syncope    2005, ICD placed  . Systolic CHF, chronic ([place])   . Vitamin B12 deficiency    Past Surgical History:  Procedure Laterality Date  . BIV UPGRADE  08/21/2018  . BIV UPGRADE N/A 08/21/2018   Procedure: BIV UPGRADE;  Surgeon: Evans Lance, MD;  Location: [name];  Service: Cardiovascular;  Laterality: N/A;  . CARDIAC CATHETERIZATION  2008  . ICD IMPLANT  2005  . INSERTION PROSTATE RADIATION SEED    . LAPAROSCOPIC CHOLECYSTECTOMY  04/2005   Dr. Lucia Gaskins  . PROSTATE BIOPSY    . RECTAL DILATATION     "stretched it twice" (08/21/2018)  . SHOULDER OPEN ROTATOR CUFF REPAIR Right     Current Outpatient Medications  Medication Sig Dispense Refill  . acetaminophen (TYLENOL) 650 MG CR tablet Take 1,300 mg by mouth 2 (two) times daily.    Marland Kitchen aspirin 81 MG tablet Take 81 mg by mouth daily.      Marland Kitchen CALCIUM PO Take 1 tablet by mouth daily.    . Cholecalciferol (VITAMIN D) 50 MCG (2000 UT) CAPS Take 2,000 Units by mouth daily.    Marland Kitchen  clopidogrel (PLAVIX) 75 MG tablet Take 1 tablet (75 mg total) by mouth daily. 90 tablet 4  . cyanocobalamin (,VITAMIN B-12,) 1000 MCG/ML injection INJECT 1 ML INTO THE MUSCLE EVERY 30 DAYS 1 mL 5  . fish oil-omega-3 fatty acids 1000 MG capsule Take 1 g by mouth 2 (two) times daily.     Marland Kitchen gemfibrozil (LOPID) 600 MG tablet Take 1 tablet (600 mg total) by mouth daily. 90 tablet 2  . levothyroxine (SYNTHROID) 50 MCG tablet Take 1 tablet (50 mcg total) by mouth daily before breakfast. 90 tablet 3  . metoprolol succinate (TOPROL-XL) 50 MG 24 hr tablet Take 1 tablet (50 mg total) by mouth daily. Take with or immediately following a meal. 90 tablet 3  . Multiple Vitamins-Minerals (MENS MULTIVITAMIN PLUS PO) Take 1 tablet by mouth daily.      .  polyethylene glycol (MIRALAX / GLYCOLAX) packet Take 17 g by mouth See admin instructions. Take 17g mixed with liquid once daily, may do a second dose as needed for constipation    . ramipril (ALTACE) 10 MG capsule Take 1 capsule (10 mg total) by mouth daily. 90 capsule 3  . simvastatin (ZOCOR) 40 MG tablet Take 1 tablet (40 mg total) by mouth at bedtime. 90 tablet 2   No current facility-administered medications for this visit.     Allergies:   Pantoprazole   Social History: Social History   Socioeconomic History  . Marital status: Widowed    Spouse name: Not on file  . Number of children: 2  . Years of education: Not on file  . Highest education level: Not on file  Occupational History  . Occupation: repairs looms at [employer]  . Financial resource strain: Not on file  . Food insecurity:    Worry: Not on file    Inability: Not on file  . Transportation needs:    Medical: Not on file    Non-medical: Not on file  Tobacco Use  . Smoking status: Former Smoker    Packs/day: 1.00    Years: 2.00    Pack years: 2.00    Types: Cigarettes    Last attempt to quit: 08/06/1952    Years since quitting: 66.4  . Smokeless tobacco: Never Used  Substance and Sexual Activity  . Alcohol use: Never    Frequency: Never  . Drug use: Never  . Sexual activity: Not Currently  Lifestyle  . Physical activity:    Days per week: Not on file    Minutes per session: Not on file  . Stress: Not on file  Relationships  . Social connections:    Talks on phone: Not on file    Gets together: Not on file    Attends religious service: Not on file    Active member of club or organization: Not on file    Attends meetings of clubs or organizations: Not on file    Relationship status: Not on file  . Intimate partner violence:    Fear of current or ex partner: Not on file    Emotionally abused: Not on file    Physically abused: Not on file    Forced sexual activity: Not on file  Other  Topics Concern  . Not on file  Social History Narrative  . Not on file    Family History: Family History  Problem Relation Age of Onset  . Stroke Mother   . Cancer Mother 62  unknown  . Heart attack Father 51  . Heart Problems Father   . Heart attack Brother   . Lung cancer Brother   . Stroke Brother   . Heart Problems Brother        CABG  . Lung cancer Sister   . Other Sister        PACEMAKER  . Breast cancer Maternal Aunt     Review of Systems: All other systems reviewed and are otherwise negative except as noted above.   Physical Exam: Vitals:   01/05/19 0840  BP: 136/84  Pulse: 62  SpO2: 97%  Weight: 171 lb 7 oz (77.8 kg)  Height: 5' 11.5" (1.816 m)     GEN- The patient is well appearing, alert and oriented x 3 today.   HEENT: normocephalic, atraumatic; sclera clear, conjunctiva pink; hearing intact; oropharynx clear; neck supple, no JVP Lymph- no cervical lymphadenopathy Lungs- Clear to ausculation bilaterally, normal work of breathing.  No wheezes, rales, rhonchi Heart- Regular rate and rhythm, no murmurs, rubs or gallops, PMI not laterally displaced GI- soft, non-tender, non-distended, bowel sounds present, no hepatosplenomegaly Extremities- no clubbing, cyanosis, or edema; DP/PT/radial pulses 2+ bilaterally MS- no significant deformity or atrophy Skin- warm and dry, no rash or lesion; PPM pocket well healed Psych- euthymic mood, full affect Neuro- strength and sensation are intact  PPM interrogation- reviewed in detail today,  See PACEART report  EKG:  EKG is not ordered today.  Recent Labs: 02/07/2018: TSH 3.63 08/21/2018: BUN 22; Creatinine, Ser 1.58; Hemoglobin 9.8; Platelets 196; Potassium 4.5; Sodium 142   Wt Readings from Last 3 Encounters:  01/05/19 171 lb 7 oz (77.8 kg)  08/22/18 164 lb 0.4 oz (74.4 kg)  08/14/18 170 lb (77.1 kg)     Other studies Reviewed: Additional studies/ records that were reviewed today include: None    Assessment and Plan:  1. Chronic systolic dysfunction Euvolemic today Stable on an appropriate medical regimen  2. PPM CXR 08/2018 with lead in stable appearance See PaceArt report Atrial trach detection increased to 170 bpm and LV Cap confirm turned on.   3. CAD Continue ASA and Plavix Previously switched to Protonix due to Omeprazole interference with Plavix. Protonix depleted his B12 causing glossitis. For now, will restart Omeprazole as he is 12 years out from his most recent intervention. Will confirm with Dr. [name].    Current medicines are reviewed at length with the patient today.   The patient does not have concerns regarding his medicines.  The following changes were made today:  Stop Protonix. Restart omeprazole 20 mg daily.   Labs/ tests ordered today include: None No orders of the defined types were placed in this encounter.  Disposition:   Follow up with Dr. [name] annually. Continue 3 month remote follows.   Jacalyn Lefevre, PA-C  01/05/2019 9:01 AM  [facility] [address] [place] [address] [phone] (office) [phone] (fax)

## 2019-01-05 NOTE — Patient Instructions (Addendum)
Medication Instructions:  Start Omeprazole (Prilosec) 20 mg Daily If you need a refill on your cardiac medications before your next appointment, please call your pharmacy.   Lab work: none If you have labs (blood work) drawn today and your tests are completely normal, you will receive your results only by: Marland Kitchen MyChart Message (if you have MyChart) OR . A paper copy in the mail If you have any lab test that is abnormal or we need to change your treatment, we will call you to review the results.  Testing/Procedures: none  Follow-Up: At [facility], you and your health needs are our priority.  As part of our continuing mission to provide you with exceptional heart care, we have created designated Provider Care Teams.  These Care Teams include your primary Cardiologist (physician) and Advanced Practice Providers (APPs -  Physician Assistants and Nurse Practitioners) who all work together to provide you with the care you need, when you need it. You will need a follow up appointment in 1 years.  Please call our office 2 months in advance to schedule this appointment.  You may see [name], MD or one of the following Advanced Practice Providers on your designated Care Team:   Chanetta Marshall, NP . [name], PA-C  Any Other Special Instructions Will Be Listed Below (If Applicable). Remote monitoring is used to monitor your  ICD from home. This monitoring reduces the number of office visits required to check your device to one time per year. It allows [place] to keep an eye on the functioning of your device to ensure it is working properly. You are scheduled for a device check from home on 01/15/2019. You may send your transmission at any time that day. If you have a wireless device, the transmission will be sent automatically. After your physician reviews your transmission, you will receive a postcard with your next transmission date.

## 2019-01-08 ENCOUNTER — Other Ambulatory Visit: Payer: Self-pay

## 2019-01-08 ENCOUNTER — Ambulatory Visit (INDEPENDENT_AMBULATORY_CARE_PROVIDER_SITE_OTHER): Payer: Medicare Other

## 2019-01-08 ENCOUNTER — Encounter: Payer: Medicare Other | Admitting: Internal Medicine

## 2019-01-08 DIAGNOSIS — E538 Deficiency of other specified B group vitamins: Secondary | ICD-10-CM | POA: Diagnosis not present

## 2019-01-08 MED ORDER — CYANOCOBALAMIN 1000 MCG/ML IJ SOLN
1000.0000 ug | Freq: Once | INTRAMUSCULAR | Status: AC
  Administered 2019-01-08: 1000 ug via INTRAMUSCULAR

## 2019-01-08 NOTE — Progress Notes (Signed)
Medical screening examination/treatment/procedure(s) were performed by non-physician practitioner and as supervising physician I was immediately available for consultation/collaboration. I agree with above. Aziyah Provencal, MD   

## 2019-01-15 ENCOUNTER — Ambulatory Visit (INDEPENDENT_AMBULATORY_CARE_PROVIDER_SITE_OTHER): Payer: Medicare Other | Admitting: *Deleted

## 2019-01-15 DIAGNOSIS — I255 Ischemic cardiomyopathy: Secondary | ICD-10-CM

## 2019-01-15 LAB — CUP PACEART REMOTE DEVICE CHECK
Battery Remaining Longevity: 87 mo
Battery Remaining Percentage: 95.5 %
Battery Voltage: 2.99 V
Brady Statistic AP VP Percent: 81 %
Brady Statistic AP VS Percent: 1 %
Brady Statistic AS VP Percent: 19 %
Brady Statistic AS VS Percent: 1 %
Brady Statistic RA Percent Paced: 81 %
Brady Statistic RV Percent Paced: 100 %
Date Time Interrogation Session: 20200611064344
Implantable Lead Implant Date: 20050509
Implantable Lead Implant Date: 20050509
Implantable Lead Implant Date: 20200116
Implantable Lead Location: 753858
Implantable Lead Location: 753859
Implantable Lead Location: 753860
Implantable Lead Model: 1580
Implantable Pulse Generator Implant Date: 20200116
Lead Channel Impedance Value: 280 Ohm
Lead Channel Impedance Value: 390 Ohm
Lead Channel Impedance Value: 710 Ohm
Lead Channel Pacing Threshold Amplitude: 0.75 V
Lead Channel Pacing Threshold Amplitude: 0.875 V
Lead Channel Pacing Threshold Amplitude: 1.75 V
Lead Channel Pacing Threshold Pulse Width: 0.5 ms
Lead Channel Pacing Threshold Pulse Width: 0.5 ms
Lead Channel Pacing Threshold Pulse Width: 1 ms
Lead Channel Sensing Intrinsic Amplitude: 2 mV
Lead Channel Setting Pacing Amplitude: 1.75 V
Lead Channel Setting Pacing Amplitude: 2 V
Lead Channel Setting Pacing Amplitude: 2.25 V
Lead Channel Setting Pacing Pulse Width: 0.5 ms
Lead Channel Setting Pacing Pulse Width: 1 ms
Lead Channel Setting Sensing Sensitivity: 4 mV
Pulse Gen Serial Number: 9097773

## 2019-01-21 ENCOUNTER — Telehealth: Payer: Self-pay | Admitting: Internal Medicine

## 2019-01-21 NOTE — Telephone Encounter (Signed)
Spoke with patient and daughter. PNS is intermittent, occurs mostly when pt is in the car. He thinks it may be worse since his last visit (LV cap confirm turned on at 01/05/19 OV). Explained this is not dangerous, will require further lead testing in office. Pt agreeable to DC visit tomorrow, 01/22/19 at 9:30am. Aware to come by himself and wear his own mask.  Assisted with manual transmission-- no abnormalities noted. Pt's daughter verbalizes understanding.      COVID-19 Pre-Screening Questions:  . In the past 7 to 10 days have you had a cough,  shortness of breath, headache, congestion, fever (100 or greater) body aches, chills, sore throat, or sudden loss of taste or sense of smell? . Have you been around anyone with known Covid 19. . Have you been around anyone who is awaiting Covid 19 test results in the past 7 to 10 days? . Have you been around anyone who has been exposed to Covid 19, or has mentioned symptoms of Covid 19 within the past 7 to 10 days?  If you have any concerns/questions about symptoms patients report during screening (either on the phone or at threshold). Contact the provider seeing the patient or DOD for further guidance.  If neither are available contact a member of the leadership team.  Patient answered "no" to all screening questions.

## 2019-01-21 NOTE — Telephone Encounter (Signed)
Patient wife states that pt is experiencing the thumping he states that when he is driving it is really bad and sometimes when he lays down. Instructed pt wife that I would send note to Device Tech RN and someone will call back. Pt wife verbalized understanding.

## 2019-01-21 NOTE — Telephone Encounter (Signed)
New Message      [name] is calling and says the pt has a pacemaker that needs to be adjusted and she would like an appt    Please call

## 2019-01-22 ENCOUNTER — Other Ambulatory Visit: Payer: Self-pay

## 2019-01-22 ENCOUNTER — Encounter: Payer: Self-pay | Admitting: Cardiology

## 2019-01-22 ENCOUNTER — Ambulatory Visit (INDEPENDENT_AMBULATORY_CARE_PROVIDER_SITE_OTHER): Payer: Medicare Other | Admitting: *Deleted

## 2019-01-22 DIAGNOSIS — R55 Syncope and collapse: Secondary | ICD-10-CM

## 2019-01-22 DIAGNOSIS — I255 Ischemic cardiomyopathy: Secondary | ICD-10-CM | POA: Diagnosis not present

## 2019-01-22 LAB — CUP PACEART INCLINIC DEVICE CHECK
Battery Remaining Longevity: 160 mo
Battery Voltage: 2.99 V
Brady Statistic RA Percent Paced: 0 %
Brady Statistic RV Percent Paced: 0 %
Date Time Interrogation Session: 20200618125546
Implantable Lead Implant Date: 20050509
Implantable Lead Implant Date: 20050509
Implantable Lead Implant Date: 20200116
Implantable Lead Location: 753858
Implantable Lead Location: 753859
Implantable Lead Location: 753860
Implantable Lead Model: 1580
Implantable Pulse Generator Implant Date: 20200116
Lead Channel Impedance Value: 275 Ohm
Lead Channel Impedance Value: 400 Ohm
Lead Channel Impedance Value: 700 Ohm
Lead Channel Pacing Threshold Amplitude: 0.75 V
Lead Channel Pacing Threshold Amplitude: 0.875 V
Lead Channel Pacing Threshold Amplitude: 1 V
Lead Channel Pacing Threshold Pulse Width: 0.5 ms
Lead Channel Pacing Threshold Pulse Width: 0.5 ms
Lead Channel Pacing Threshold Pulse Width: 1 ms
Lead Channel Sensing Intrinsic Amplitude: 2.1 mV
Lead Channel Setting Pacing Amplitude: 1.5 V
Lead Channel Setting Pacing Amplitude: 1.75 V
Lead Channel Setting Pacing Amplitude: 2 V
Lead Channel Setting Pacing Pulse Width: 0.5 ms
Lead Channel Setting Pacing Pulse Width: 1 ms
Lead Channel Setting Sensing Sensitivity: 4 mV
Pulse Gen Serial Number: 9097773

## 2019-01-22 NOTE — Progress Notes (Signed)
Remote ICD transmission.   

## 2019-01-22 NOTE — Patient Instructions (Signed)
Call clinic if "thumping feeling"  returns from pacemaker.

## 2019-01-22 NOTE — Progress Notes (Signed)
CRT-P device check in clinic. Thresholds, sensing, impedance consistent with previous measurements. Histograms appropriate for patient and level of activity. No mode switches or ventricular high rate episodes. Patient bi-ventricularly pacing > 99% of the time. RA, RV  function unchanged from previous interrogation.  Pt experiencing STIM with LV programmed 3-Can, per industry vector changed  2-4 without PNS, output fixed at 1.5 v at 1.0 sec. Estimated longevity 7 yrs. Patient enrolled in remote follow-up, next device remote f/u 04/16/19. F/U with GT 6/21.

## 2019-01-30 ENCOUNTER — Encounter: Payer: Self-pay | Admitting: Internal Medicine

## 2019-01-30 ENCOUNTER — Other Ambulatory Visit: Payer: Self-pay

## 2019-01-30 ENCOUNTER — Telehealth: Payer: Self-pay | Admitting: Emergency Medicine

## 2019-01-30 ENCOUNTER — Other Ambulatory Visit (INDEPENDENT_AMBULATORY_CARE_PROVIDER_SITE_OTHER): Payer: Medicare Other

## 2019-01-30 ENCOUNTER — Ambulatory Visit (INDEPENDENT_AMBULATORY_CARE_PROVIDER_SITE_OTHER): Payer: Medicare Other | Admitting: Internal Medicine

## 2019-01-30 VITALS — BP 128/72 | HR 79 | Temp 97.8°F | Ht 71.5 in | Wt 169.0 lb

## 2019-01-30 DIAGNOSIS — I255 Ischemic cardiomyopathy: Secondary | ICD-10-CM

## 2019-01-30 DIAGNOSIS — E559 Vitamin D deficiency, unspecified: Secondary | ICD-10-CM

## 2019-01-30 DIAGNOSIS — D509 Iron deficiency anemia, unspecified: Secondary | ICD-10-CM | POA: Diagnosis not present

## 2019-01-30 DIAGNOSIS — E538 Deficiency of other specified B group vitamins: Secondary | ICD-10-CM

## 2019-01-30 DIAGNOSIS — E785 Hyperlipidemia, unspecified: Secondary | ICD-10-CM

## 2019-01-30 DIAGNOSIS — E041 Nontoxic single thyroid nodule: Secondary | ICD-10-CM | POA: Diagnosis not present

## 2019-01-30 DIAGNOSIS — R739 Hyperglycemia, unspecified: Secondary | ICD-10-CM

## 2019-01-30 DIAGNOSIS — Z23 Encounter for immunization: Secondary | ICD-10-CM

## 2019-01-30 DIAGNOSIS — I10 Essential (primary) hypertension: Secondary | ICD-10-CM

## 2019-01-30 LAB — CBC WITH DIFFERENTIAL/PLATELET
Basophils Absolute: 0 10*3/uL (ref 0.0–0.1)
Basophils Relative: 0.8 % (ref 0.0–3.0)
Eosinophils Absolute: 0.4 10*3/uL (ref 0.0–0.7)
Eosinophils Relative: 9.8 % — ABNORMAL HIGH (ref 0.0–5.0)
HCT: 26.2 % — ABNORMAL LOW (ref 39.0–52.0)
Hemoglobin: 9.3 g/dL — ABNORMAL LOW (ref 13.0–17.0)
Lymphocytes Relative: 25.7 % (ref 12.0–46.0)
Lymphs Abs: 0.9 10*3/uL (ref 0.7–4.0)
MCHC: 35.6 g/dL (ref 30.0–36.0)
MCV: 95.3 fl (ref 78.0–100.0)
Monocytes Absolute: 0.4 10*3/uL (ref 0.1–1.0)
Monocytes Relative: 12.1 % — ABNORMAL HIGH (ref 3.0–12.0)
Neutro Abs: 1.8 10*3/uL (ref 1.4–7.7)
Neutrophils Relative %: 51.6 % (ref 43.0–77.0)
Platelets: 204 10*3/uL (ref 150.0–400.0)
RBC: 2.75 Mil/uL — ABNORMAL LOW (ref 4.22–5.81)
RDW: 13.3 % (ref 11.5–15.5)
WBC: 3.6 10*3/uL — ABNORMAL LOW (ref 4.0–10.5)

## 2019-01-30 LAB — HEMOGLOBIN A1C: Hgb A1c MFr Bld: 5.5 % (ref 4.6–6.5)

## 2019-01-30 LAB — HEPATIC FUNCTION PANEL
ALT: 10 U/L (ref 0–53)
AST: 20 U/L (ref 0–37)
Albumin: 4.5 g/dL (ref 3.5–5.2)
Alkaline Phosphatase: 58 U/L (ref 39–117)
Bilirubin, Direct: 0.1 mg/dL (ref 0.0–0.3)
Total Bilirubin: 0.7 mg/dL (ref 0.2–1.2)
Total Protein: 7 g/dL (ref 6.0–8.3)

## 2019-01-30 LAB — BASIC METABOLIC PANEL
BUN: 25 mg/dL — ABNORMAL HIGH (ref 6–23)
CO2: 26 mEq/L (ref 19–32)
Calcium: 9.7 mg/dL (ref 8.4–10.5)
Chloride: 108 mEq/L (ref 96–112)
Creatinine, Ser: 1.48 mg/dL (ref 0.40–1.50)
GFR: 45.22 mL/min — ABNORMAL LOW (ref >60.00)
Glucose, Bld: 90 mg/dL (ref 70–99)
Potassium: 4.5 mEq/L (ref 3.5–5.1)
Sodium: 142 mEq/L (ref 135–145)

## 2019-01-30 LAB — TSH: TSH: 2.75 u[IU]/mL (ref 0.35–4.50)

## 2019-01-30 LAB — LIPID PANEL
Cholesterol: 166 mg/dL (ref 0–200)
HDL: 44 mg/dL (ref >39.00)
LDL Cholesterol: 89 mg/dL (ref 0–99)
NonHDL: 122.33
Total CHOL/HDL Ratio: 4
Triglycerides: 166 mg/dL — ABNORMAL HIGH (ref 0.0–149.0)
VLDL: 33.2 mg/dL (ref 0.0–40.0)

## 2019-01-30 LAB — VITAMIN D 25 HYDROXY (VIT D DEFICIENCY, FRACTURES): VITD: >120 ng/mL

## 2019-01-30 LAB — T4, FREE: Free T4: 0.64 ng/dL (ref 0.60–1.60)

## 2019-01-30 LAB — VITAMIN B12: Vitamin B-12: >1515 pg/mL — ABNORMAL HIGH (ref 211–911)

## 2019-01-30 LAB — IBC PANEL
Iron: 106 ug/dL (ref 42–165)
Saturation Ratios: 27.2 % (ref 20.0–50.0)
Transferrin: 278 mg/dL (ref 212.0–360.0)

## 2019-01-30 MED ORDER — CYANOCOBALAMIN 1000 MCG/ML IJ SOLN
1000.0000 ug | Freq: Once | INTRAMUSCULAR | Status: AC
  Administered 2019-01-30: 09:00:00 1000 ug via INTRAMUSCULAR

## 2019-01-30 NOTE — Patient Instructions (Addendum)
You had the Tdap tetanus shot today  You had the B12 shot today  Please continue all other medications as before, and refills have been done if requested.  Please have the pharmacy call with any other refills you may need.  Please continue your efforts at being more active, low cholesterol diet, and weight control.  You are otherwise up to date with prevention measures today.  Please keep your appointments with your specialists as you may have planned  You will be contacted regarding the referral for: Thyroid Ultrasound  Please go to the LAB in the Basement (turn left off the elevator) for the tests to be done today  You will be contacted by phone if any changes need to be made immediately.  Otherwise, you will receive a letter about your results with an explanation, but please check with MyChart first.  Please remember to sign up for MyChart if you have not done so, as this will be important to you in the future with finding out test results, communicating by private email, and scheduling acute appointments online when needed.  Please return in 6 months, or sooner if needed

## 2019-01-30 NOTE — Progress Notes (Signed)
Subjective:    Patient ID: [name], male    DOB: [birth year], 85 y.o.   MRN: [id number]  HPI  Here to f/u; overall doing ok,  Pt denies chest pain, increasing sob or doe, wheezing, orthopnea, PND, increased LE swelling, palpitations, dizziness or syncope.  Pt denies new neurological symptoms such as new headache, or facial or extremity weakness or numbness.  Pt denies polydipsia, polyuria, or low sugar episode.  Pt states overall good compliance with meds, mostly trying to follow appropriate diet, with wt overall stable,  but little exercise however. No overt bleeding, Due for B12 shot today,  Due for Tdap  No new complaints Past Medical History:  Diagnosis Date  . AICD (automatic cardioverter/defibrillator) present    "turned it off today, put in an extra lead,  and made it just a pacemaker" (08/21/2018)  . Anemia, unspecified   . Aortic stenosis    Mild, echo, October, 2011  . Arthritis    "joints; arms"  (08/21/2018)  . AV block, complete (HCC)    Permanent pacemaker  . CAD (coronary artery disease)    DES to LAD 2005 /  DES to LAD 2008  . Cardiomyopathy    Ischemic, EF 30%, 2005  . Chronic bronchitis ([place])   . DJD (degenerative joint disease)   . Dyslipidemia   . Ejection fraction < 50%    EF 30%, echo, 2005  /  EF 30%, catheterization, 2008  . GERD (gastroesophageal reflux disease)   . Hyperlipidemia   . Hypertension   . Hypothyroidism   . ICD (implantable cardiac defibrillator), single, in situ    for syncope and arrhythmia 2005 / generator change June, 2011  . Iron deficiency anemia 06/17/2018  . LBBB (left bundle branch block)   . Mitral regurgitation    Mild, echo, October, 2011  . Myocardial infarction ([place]) 1989  . Presence of permanent cardiac pacemaker   . Prostate cancer ([place])   . Rectal fissure   . Syncope    2005, ICD placed  . Systolic CHF, chronic ([place])   . Vitamin B12 deficiency    Past Surgical History:  Procedure Laterality Date  . BIV  UPGRADE  08/21/2018  . BIV UPGRADE N/A 08/21/2018   Procedure: BIV UPGRADE;  Surgeon: Evans Lance, MD;  Location: [facility];  Service: Cardiovascular;  Laterality: N/A;  . CARDIAC CATHETERIZATION  2008  . ICD IMPLANT  2005  . INSERTION PROSTATE RADIATION SEED    . LAPAROSCOPIC CHOLECYSTECTOMY  04/2005   Dr. Lucia Gaskins  . PROSTATE BIOPSY    . RECTAL DILATATION     "stretched it twice" (08/21/2018)  . SHOULDER OPEN ROTATOR CUFF REPAIR Right     reports that he quit smoking about 66 years ago. His smoking use included cigarettes. He has a 2.00 pack-year smoking history. He has never used smokeless tobacco. He reports that he does not drink alcohol or use drugs. family history includes Breast cancer in his maternal aunt; Cancer (age of onset: 73) in his mother; Heart Problems in his brother and father; Heart attack in his brother; Heart attack (age of onset: 45) in his father; Lung cancer in his brother and sister; Other in his sister; Stroke in his brother and mother. Allergies  Allergen Reactions  . Pantoprazole Other (See Comments)    B12 levels dropped on medication   Current Outpatient Medications on File Prior to Visit  Medication Sig Dispense Refill  . acetaminophen (TYLENOL) 650 MG  CR tablet Take 1,300 mg by mouth 2 (two) times daily.    Marland Kitchen aspirin 81 MG tablet Take 81 mg by mouth daily.      Marland Kitchen CALCIUM PO Take 1 tablet by mouth daily.    . Cholecalciferol (VITAMIN D) 50 MCG (2000 UT) CAPS Take 2,000 Units by mouth daily.    . clopidogrel (PLAVIX) 75 MG tablet Take 1 tablet (75 mg total) by mouth daily. 90 tablet 4  . cyanocobalamin (,VITAMIN B-12,) 1000 MCG/ML injection INJECT 1 ML INTO THE MUSCLE EVERY 30 DAYS 1 mL 5  . fish oil-omega-3 fatty acids 1000 MG capsule Take 1 g by mouth 2 (two) times daily.     Marland Kitchen gemfibrozil (LOPID) 600 MG tablet Take 1 tablet (600 mg total) by mouth daily. 90 tablet 2  . levothyroxine (SYNTHROID) 50 MCG tablet Take 1 tablet (50 mcg total) by mouth  daily before breakfast. 90 tablet 3  . metoprolol succinate (TOPROL-XL) 50 MG 24 hr tablet Take 1 tablet (50 mg total) by mouth daily. Take with or immediately following a meal. 90 tablet 3  . Multiple Vitamins-Minerals (MENS MULTIVITAMIN PLUS PO) Take 1 tablet by mouth daily.      Marland Kitchen omeprazole (PRILOSEC) 20 MG capsule Take 1 capsule (20 mg total) by mouth daily. 30 capsule 11  . polyethylene glycol (MIRALAX / GLYCOLAX) packet Take 17 g by mouth See admin instructions. Take 17g mixed with liquid once daily, may do a second dose as needed for constipation    . ramipril (ALTACE) 10 MG capsule Take 1 capsule (10 mg total) by mouth daily. 90 capsule 3  . simvastatin (ZOCOR) 40 MG tablet Take 1 tablet (40 mg total) by mouth at bedtime. 90 tablet 2   No current facility-administered medications on file prior to visit.    Review of Systems  Constitutional: Negative for other unusual diaphoresis or sweats HENT: Negative for ear discharge or swelling Eyes: Negative for other worsening visual disturbances Respiratory: Negative for stridor or other swelling  Gastrointestinal: Negative for worsening distension or other blood Genitourinary: Negative for retention or other urinary change Musculoskeletal: Negative for other MSK pain or swelling Skin: Negative for color change or other new lesions Neurological: Negative for worsening tremors and other numbness  Psychiatric/Behavioral: Negative for worsening agitation or other fatigue All other system neg per pt    Objective:   Physical Exam BP 128/72   Pulse 79   Temp 97.8 F (36.6 C) (Oral)   Ht 5' 11.5" (1.816 m)   Wt 169 lb (76.7 kg)   SpO2 98%   BMI 23.24 kg/m  VS noted, not ill appearing Constitutional: Pt appears in NAD HENT: Head: NCAT.  Right Ear: External ear normal.  Left Ear: External ear normal.  Eyes: . Pupils are equal, round, and reactive to light. Conjunctivae and EOM are normal Nose: without d/c or deformity Neck: Neck  supple. Gross normal ROM Cardiovascular: Normal rate and regular rhythm.   Pulmonary/Chest: Effort normal and breath sounds without rales or wheezing.  Abd:  Soft, NT, ND, + BS, no organomegaly Neurological: Pt is alert. At baseline orientation, motor grossly intact Skin: Skin is warm. No rashes, other new lesions, no LE edema Psychiatric: Pt behavior is normal without agitation \ Thyroid 1-2+ with nodularity, firmness No other exam findings Lab Results  Component Value Date   WBC 3.6 (L) 01/30/2019   HGB 9.3 (L) 01/30/2019   HCT 26.2 Repeated and verified X2. (L) 01/30/2019   PLT  204.0 01/30/2019   GLUCOSE 90 01/30/2019   CHOL 166 01/30/2019   TRIG 166.0 (H) 01/30/2019   HDL 44.00 01/30/2019   LDLDIRECT 96.3 02/26/2011   LDLCALC 89 01/30/2019   ALT 10 01/30/2019   AST 20 01/30/2019   NA 142 01/30/2019   K 4.5 01/30/2019   CL 108 01/30/2019   CREATININE 1.48 01/30/2019   BUN 25 (H) 01/30/2019   CO2 26 01/30/2019   TSH 2.75 01/30/2019   PSA 12.25 (H) 03/09/2016   INR 0.95 01/06/2010   HGBA1C 5.5 01/30/2019        Assessment & Plan:

## 2019-01-30 NOTE — Telephone Encounter (Signed)
CRITICAL VALUE STICKER  CRITICAL VALUE: Vitamin D greater than 120   RECEIVER (on-site recipient of call):  DATE & TIME NOTIFIED: 01/30/19 at 11:48 am

## 2019-01-30 NOTE — Assessment & Plan Note (Signed)
With goiter, not clear if chronic, for tft's but also thyroid u/s

## 2019-02-01 ENCOUNTER — Encounter: Payer: Self-pay | Admitting: Internal Medicine

## 2019-02-01 NOTE — Assessment & Plan Note (Signed)
stable overall by history and exam, recent data reviewed with pt, and pt to continue medical treatment as before,  to f/u any worsening symptoms or concerns  

## 2019-02-01 NOTE — Assessment & Plan Note (Signed)
Also for cbc, iron panel with labs,  to f/u any worsening symptoms or concerns, cont oral iron

## 2019-02-01 NOTE — Assessment & Plan Note (Signed)
For b12 IM 1000 mg

## 2019-02-02 NOTE — Addendum Note (Signed)
Addended by: Karren Cobble on: 02/02/2019 11:01 AM   Modules accepted: Orders

## 2019-02-09 ENCOUNTER — Ambulatory Visit: Payer: Medicare Other

## 2019-02-26 ENCOUNTER — Ambulatory Visit
Admission: RE | Admit: 2019-02-26 | Discharge: 2019-02-26 | Disposition: A | Discharge status: Home / Self Care | Payer: Medicare Other | Source: Ambulatory Visit | Attending: Internal Medicine | Admitting: Internal Medicine

## 2019-02-26 DIAGNOSIS — E041 Nontoxic single thyroid nodule: Secondary | ICD-10-CM | POA: Diagnosis not present

## 2019-02-27 ENCOUNTER — Ambulatory Visit (INDEPENDENT_AMBULATORY_CARE_PROVIDER_SITE_OTHER): Payer: Medicare Other | Admitting: *Deleted

## 2019-02-27 DIAGNOSIS — E538 Deficiency of other specified B group vitamins: Secondary | ICD-10-CM

## 2019-02-27 MED ORDER — CYANOCOBALAMIN 1000 MCG/ML IJ SOLN
1000.0000 ug | Freq: Once | INTRAMUSCULAR | Status: AC
  Administered 2019-02-27: 1000 ug via INTRAMUSCULAR

## 2019-02-27 NOTE — Progress Notes (Signed)
Pls cosign for B12 injection

## 2019-03-02 ENCOUNTER — Encounter: Payer: Self-pay | Admitting: Internal Medicine

## 2019-03-09 DIAGNOSIS — M25512 Pain in left shoulder: Secondary | ICD-10-CM | POA: Insufficient documentation

## 2019-03-30 ENCOUNTER — Ambulatory Visit (INDEPENDENT_AMBULATORY_CARE_PROVIDER_SITE_OTHER): Payer: Medicare Other

## 2019-03-30 ENCOUNTER — Other Ambulatory Visit: Payer: Self-pay

## 2019-03-30 ENCOUNTER — Ambulatory Visit: Payer: Medicare Other

## 2019-03-30 DIAGNOSIS — M25512 Pain in left shoulder: Secondary | ICD-10-CM | POA: Diagnosis not present

## 2019-03-30 DIAGNOSIS — E538 Deficiency of other specified B group vitamins: Secondary | ICD-10-CM

## 2019-03-30 MED ORDER — CYANOCOBALAMIN 1000 MCG/ML IJ SOLN
1000.0000 ug | Freq: Once | INTRAMUSCULAR | Status: AC
  Administered 2019-03-30: 1000 ug via INTRAMUSCULAR

## 2019-03-31 NOTE — Progress Notes (Signed)
Medical screening examination/treatment/procedure(s) were performed by non-physician practitioner and as supervising physician I was immediately available for consultation/collaboration. I agree with above. James John, MD   

## 2019-04-12 ENCOUNTER — Other Ambulatory Visit: Payer: Self-pay | Admitting: Internal Medicine

## 2019-04-12 ENCOUNTER — Other Ambulatory Visit: Payer: Self-pay | Admitting: Pulmonary Disease

## 2019-04-16 ENCOUNTER — Ambulatory Visit (INDEPENDENT_AMBULATORY_CARE_PROVIDER_SITE_OTHER): Payer: Medicare Other | Admitting: *Deleted

## 2019-04-16 DIAGNOSIS — I255 Ischemic cardiomyopathy: Secondary | ICD-10-CM

## 2019-04-16 DIAGNOSIS — I447 Left bundle-branch block, unspecified: Secondary | ICD-10-CM

## 2019-04-16 LAB — CUP PACEART REMOTE DEVICE CHECK
Battery Remaining Longevity: 88 mo
Battery Remaining Percentage: 95.5 %
Battery Voltage: 2.99 V
Brady Statistic AP VP Percent: 82 %
Brady Statistic AP VS Percent: 1 %
Brady Statistic AS VP Percent: 17 %
Brady Statistic AS VS Percent: 1 %
Brady Statistic RA Percent Paced: 81 %
Brady Statistic RV Percent Paced: 99 %
Date Time Interrogation Session: 20200910060018
Implantable Lead Implant Date: 20050509
Implantable Lead Implant Date: 20050509
Implantable Lead Implant Date: 20200116
Implantable Lead Location: 753858
Implantable Lead Location: 753859
Implantable Lead Location: 753860
Implantable Lead Model: 1580
Implantable Pulse Generator Implant Date: 20200116
Lead Channel Impedance Value: 280 Ohm
Lead Channel Impedance Value: 390 Ohm
Lead Channel Impedance Value: 780 Ohm
Lead Channel Pacing Threshold Amplitude: 0.625 V
Lead Channel Pacing Threshold Amplitude: 0.875 V
Lead Channel Pacing Threshold Pulse Width: 0.5 ms
Lead Channel Pacing Threshold Pulse Width: 0.5 ms
Lead Channel Sensing Intrinsic Amplitude: 11.7 mV
Lead Channel Sensing Intrinsic Amplitude: 2.1 mV
Lead Channel Setting Pacing Amplitude: 1.5 V
Lead Channel Setting Pacing Amplitude: 1.625
Lead Channel Setting Pacing Amplitude: 2 V
Lead Channel Setting Pacing Pulse Width: 0.5 ms
Lead Channel Setting Pacing Pulse Width: 1 ms
Lead Channel Setting Sensing Sensitivity: 4 mV
Pulse Gen Serial Number: 9097773

## 2019-04-20 ENCOUNTER — Other Ambulatory Visit: Payer: Self-pay | Admitting: Internal Medicine

## 2019-04-20 NOTE — Telephone Encounter (Signed)
Requested medication (s) are due for refill today: yes  Requested medication (s) are on the active medication list: yes  Last refill:  04/15/18 historic provider  Future visit scheduled: yes  Notes to clinic:  Historic provider    Requested Prescriptions  Pending Prescriptions Disp Refills   clopidogrel (PLAVIX) 75 MG tablet 90 tablet 4    Sig: Take 1 tablet (75 mg total) by mouth daily.     Hematology: Antiplatelets - clopidogrel Failed - 04/20/2019  4:47 PM      Failed - Evaluate AST, ALT within 2 months of therapy initiation.      Failed - HCT in normal range and within 180 days    HCT  Date Value Ref Range Status  01/30/2019 26.2 Repeated and verified X2. (L) 39.0 - 52.0 % Final   Hematocrit  Date Value Ref Range Status  08/14/2018 28.1 (L) 37.5 - 51.0 % Final         Failed - HGB in normal range and within 180 days    Hemoglobin  Date Value Ref Range Status  01/30/2019 9.3 (L) 13.0 - 17.0 g/dL Final  08/14/2018 9.6 (L) 13.0 - 17.7 g/dL Final         Passed - ALT in normal range and within 360 days    ALT  Date Value Ref Range Status  01/30/2019 10 0 - 53 U/L Final         Passed - AST in normal range and within 360 days    AST  Date Value Ref Range Status  01/30/2019 20 0 - 37 U/L Final         Passed - PLT in normal range and within 180 days    Platelets  Date Value Ref Range Status  01/30/2019 204.0 150.0 - 400.0 K/uL Final  08/14/2018 219 150 - 450 x10E3/uL Final         Passed - Valid encounter within last 6 months    Recent Outpatient Visits          2 months ago Thyroid nodule   [place], James W, MD   10 months ago Chronic systolic heart failure [facility])   [facility] -Georges Mouse, MD      Future Appointments            In 1 week McAlhany, Annita Brod, MD [place], [facility]   In 3 months Jenny Reichmann, Hunt Oris, MD [place], [facility]

## 2019-04-20 NOTE — Telephone Encounter (Signed)
Medication Refill - Medication: clopidogrel (PLAVIX) 75 MG tablet  Has the patient contacted their pharmacy? Yes - first time Dr. Jenny Reichmann will be filling (Agent: If no, request that the patient contact the pharmacy for the refill.) (Agent: If yes, when and what did the pharmacy advise?)  Preferred Pharmacy (with phone number or street name):  [facility], [place] to [facility] [phone] (Phone) [phone] (Fax)   Agent: Please be advised that RX refills may take up to 3 business days. We ask that you follow-up with your pharmacy.

## 2019-04-21 MED ORDER — CLOPIDOGREL BISULFATE 75 MG PO TABS: 75.0000 mg | ORAL_TABLET | Freq: Every day | ORAL | 1 refills | Status: DC

## 2019-04-27 NOTE — Progress Notes (Signed)
Remote pacemaker transmission.   

## 2019-04-29 ENCOUNTER — Other Ambulatory Visit: Payer: Self-pay

## 2019-04-29 ENCOUNTER — Encounter: Payer: Self-pay | Admitting: Cardiovascular Disease

## 2019-04-29 ENCOUNTER — Ambulatory Visit (INDEPENDENT_AMBULATORY_CARE_PROVIDER_SITE_OTHER): Payer: Medicare Other | Admitting: Cardiovascular Disease

## 2019-04-29 VITALS — BP 142/76 | HR 53 | Ht 71.5 in | Wt 167.8 lb

## 2019-04-29 DIAGNOSIS — I10 Essential (primary) hypertension: Secondary | ICD-10-CM | POA: Diagnosis not present

## 2019-04-29 DIAGNOSIS — I351 Nonrheumatic aortic (valve) insufficiency: Secondary | ICD-10-CM

## 2019-04-29 DIAGNOSIS — I255 Ischemic cardiomyopathy: Secondary | ICD-10-CM

## 2019-04-29 DIAGNOSIS — I442 Atrioventricular block, complete: Secondary | ICD-10-CM | POA: Diagnosis not present

## 2019-04-29 DIAGNOSIS — I447 Left bundle-branch block, unspecified: Secondary | ICD-10-CM | POA: Diagnosis not present

## 2019-04-29 DIAGNOSIS — I5022 Chronic systolic (congestive) heart failure: Secondary | ICD-10-CM

## 2019-04-29 DIAGNOSIS — I2510 Atherosclerotic heart disease of native coronary artery without angina pectoris: Secondary | ICD-10-CM

## 2019-04-29 NOTE — Progress Notes (Signed)
Chief Complaint  Patient presents with  . Follow-up    CAD   History of Present Illness: 85 yo male with history of complete heart block, CAD s/p stenting of the LAD in 2005 and 2008, ischemic cardiomyopathy s/p ICD ( implanted 2005/generator change 2011), HLD, HTN and chronic systolic CHF here today for cardiac follow up. He had been followed by Dr. Ron Parker prior to his retirement. His ICD is followed by Dr. [name]. Echo July 2016 with LVEF=40-45%, mild AI, trivial MR. Diagnosis of prostate cancer in 2014. I saw him in October 2019 and he c/o jaw pain and tooth pain with dyspnea with moderate exertion. Nuclear stress test June 18, 2018 with medium sized inferior, inferoseptal and apical defect with scar and possible soft tissue attenuation but no ischemia. LVEF noted to be 35% with hypokinesis of the inferior, inferoseptal and apical walls. The wall motion abnormalities are c/w those seen on his echo in July 2016. He has described jaw pain and tooth pain with moderate to heavy exertion for the last ten years.   He is here today for follow up. The patient denies any dyspnea, palpitations, lower extremity edema, orthopnea, PND, dizziness, near syncope or syncope. No changes in chronic jaw pain with exertion. No exertional chest pain.     Primary Care Physician: Biagio Borg, MD  Past Medical History:  Diagnosis Date  . AICD (automatic cardioverter/defibrillator) present    "turned it off today, put in an extra lead,  and made it just a pacemaker" (08/21/2018)  . Anemia, unspecified   . Aortic stenosis    Mild, echo, October, 2011  . Arthritis    "joints; arms"  (08/21/2018)  . AV block, complete (HCC)    Permanent pacemaker  . CAD (coronary artery disease)    DES to LAD 2005 /  DES to LAD 2008  . Cardiomyopathy    Ischemic, EF 30%, 2005  . Chronic bronchitis ([place])   . DJD (degenerative joint disease)   . Dyslipidemia   . Ejection fraction < 50%    EF 30%, echo, 2005  /  EF 30%,  catheterization, 2008  . GERD (gastroesophageal reflux disease)   . Hyperlipidemia   . Hypertension   . Hypothyroidism   . ICD (implantable cardiac defibrillator), single, in situ    for syncope and arrhythmia 2005 / generator change June, 2011  . Iron deficiency anemia 06/17/2018  . LBBB (left bundle branch block)   . Mitral regurgitation    Mild, echo, October, 2011  . Myocardial infarction ([place]) 1989  . Presence of permanent cardiac pacemaker   . Prostate cancer ([place])   . Rectal fissure   . Syncope    2005, ICD placed  . Systolic CHF, chronic ([place])   . Vitamin B12 deficiency     Past Surgical History:  Procedure Laterality Date  . BIV UPGRADE  08/21/2018  . BIV UPGRADE N/A 08/21/2018   Procedure: BIV UPGRADE;  Surgeon: Evans Lance, MD;  Location: [facility];  Service: Cardiovascular;  Laterality: N/A;  . CARDIAC CATHETERIZATION  2008  . ICD IMPLANT  2005  . INSERTION PROSTATE RADIATION SEED    . LAPAROSCOPIC CHOLECYSTECTOMY  04/2005   Dr. Lucia Gaskins  . PROSTATE BIOPSY    . RECTAL DILATATION     "stretched it twice" (08/21/2018)  . SHOULDER OPEN ROTATOR CUFF REPAIR Right     Current Outpatient Medications  Medication Sig Dispense Refill  . acetaminophen (TYLENOL) 650 MG CR  tablet Take 1,300 mg by mouth 2 (two) times daily.    Marland Kitchen aspirin 81 MG tablet Take 81 mg by mouth daily.      Marland Kitchen CALCIUM PO Take 2 tablets by mouth daily.     . Cholecalciferol (VITAMIN D) 50 MCG (2000 UT) CAPS Take 2,000 Units by mouth daily.    . clopidogrel (PLAVIX) 75 MG tablet Take 1 tablet (75 mg total) by mouth daily. 90 tablet 1  . cyanocobalamin (,VITAMIN B-12,) 1000 MCG/ML injection INJECT 1 ML INTO THE MUSCLE EVERY 30 DAYS 1 mL 5  . fish oil-omega-3 fatty acids 1000 MG capsule Take 1 g by mouth 2 (two) times daily.     Marland Kitchen gemfibrozil (LOPID) 600 MG tablet TAKE 1 TABLET DAILY 90 tablet 1  . levothyroxine (SYNTHROID) 50 MCG tablet Take 1 tablet (50 mcg total) by mouth daily before  breakfast. 90 tablet 3  . metoprolol succinate (TOPROL-XL) 50 MG 24 hr tablet Take 1 tablet (50 mg total) by mouth daily. Take with or immediately following a meal. 90 tablet 3  . Multiple Vitamins-Minerals (MENS MULTIVITAMIN PLUS PO) Take 1 tablet by mouth daily.      Marland Kitchen omeprazole (PRILOSEC) 20 MG capsule Take 1 capsule (20 mg total) by mouth daily. 30 capsule 11  . polyethylene glycol (MIRALAX / GLYCOLAX) packet Take 17 g by mouth See admin instructions. Take 17g mixed with liquid once daily, may do a second dose as needed for constipation    . ramipril (ALTACE) 10 MG capsule Take 1 capsule (10 mg total) by mouth daily. 90 capsule 3  . simvastatin (ZOCOR) 40 MG tablet TAKE 1 TABLET AT BEDTIME 90 tablet 1   No current facility-administered medications for this visit.     Allergies  Allergen Reactions  . Pantoprazole Other (See Comments)    B12 levels dropped on medication    Social History   Socioeconomic History  . Marital status: Widowed    Spouse name: Not on file  . Number of children: 2  . Years of education: Not on file  . Highest education level: Not on file  Occupational History  . Occupation: repairs looms at [employer]  . Financial resource strain: Not on file  . Food insecurity    Worry: Not on file    Inability: Not on file  . Transportation needs    Medical: Not on file    Non-medical: Not on file  Tobacco Use  . Smoking status: Former Smoker    Packs/day: 1.00    Years: 2.00    Pack years: 2.00    Types: Cigarettes    Quit date: 08/06/1952    Years since quitting: 66.7  . Smokeless tobacco: Never Used  Substance and Sexual Activity  . Alcohol use: Never    Frequency: Never  . Drug use: Never  . Sexual activity: Not Currently  Lifestyle  . Physical activity    Days per week: Not on file    Minutes per session: Not on file  . Stress: Not on file  Relationships  . Social Herbalist on phone: Not on file    Gets together: Not on  file    Attends religious service: Not on file    Active member of club or organization: Not on file    Attends meetings of clubs or organizations: Not on file    Relationship status: Not on file  . Intimate partner violence  Fear of current or ex partner: Not on file    Emotionally abused: Not on file    Physically abused: Not on file    Forced sexual activity: Not on file  Other Topics Concern  . Not on file  Social History Narrative  . Not on file    Family History  Problem Relation Age of Onset  . Stroke Mother   . Cancer Mother 79       unknown  . Heart attack Father 62  . Heart Problems Father   . Heart attack Brother   . Lung cancer Brother   . Stroke Brother   . Heart Problems Brother        CABG  . Lung cancer Sister   . Other Sister        PACEMAKER  . Breast cancer Maternal Aunt     Review of Systems:  As stated in the HPI and otherwise negative.   BP (!) 142/76   Pulse (!) 53   Ht 5' 11.5" (1.816 m)   Wt 167 lb 12.8 oz (76.1 kg)   SpO2 97%   BMI 23.08 kg/m   Physical Examination:  General: Well developed, well nourished, NAD  HEENT: OP clear, mucus membranes moist  SKIN: warm, dry. No rashes. Neuro: No focal deficits  Musculoskeletal: Muscle strength 5/5 all ext  Psychiatric: Mood and affect normal  Neck: No JVD, no carotid bruits, no thyromegaly, no lymphadenopathy.  Lungs:Clear bilaterally, no wheezes, rhonci, crackles Cardiovascular: Regular rate and rhythm. No murmurs, gallops or rubs. Abdomen:Soft. Bowel sounds present. Non-tender.  Extremities: No lower extremity edema. Pulses are 2 + in the bilateral DP/PT.  Echo July 2016: Left ventricle: The cavity size was normal. Systolic function was   mildly to moderately reduced. The estimated ejection fraction was   in the range of 40% to 45%. Endocardial segments are not well   visulaized. There is akinesis of the apical septal myocardium.   There is akinesis of the midinferoseptal  myocardium. There is   akinesis of the entire apical myocardium. Recommend limited echo   with definity contrast to adequately evaluate for wall motion   abnormalities. There was an increased relative contribution of   atrial contraction to ventricular filling. Doppler parameters are   consistent with abnormal left ventricular relaxation (grade 1   diastolic dysfunction). - Ventricular septum: Septal motion showed paradox. - Aortic valve: Severely focal calcification of the aortic vavle   annulus. Trileaflet. Mild thickening and calcification,   consistent with sclerosis. There was mild regurgitation. - Mitral valve: Calcified annulus. Mildly thickened leaflets . - Pulmonic valve: There was trivial regurgitation.  EKG:  EKG is not ordered today. The ekg ordered today demonstrates   Recent Labs: 01/30/2019: ALT 10; BUN 25; Creatinine, Ser 1.48; Hemoglobin 9.3; Platelets 204.0; Potassium 4.5; Sodium 142; TSH 2.75   Lipid Panel    Component Value Date/Time   CHOL 166 01/30/2019 0914   TRIG 166.0 (H) 01/30/2019 0914   HDL 44.00 01/30/2019 0914   CHOLHDL 4 01/30/2019 0914   VLDL 33.2 01/30/2019 0914   LDLCALC 89 01/30/2019 0914   LDLDIRECT 96.3 02/26/2011 0735     Wt Readings from Last 3 Encounters:  04/29/19 167 lb 12.8 oz (76.1 kg)  01/30/19 169 lb (76.7 kg)  01/05/19 171 lb 7 oz (77.8 kg)     Other studies Reviewed: Additional studies/ records that were reviewed today include: . Review of the above records demonstrates:   Assessment and Plan:  1. CAD with angina: No change in chronic chest pains. Nuclear stress test in 2019 with no ischemia. Expected wall motion abnormality with scar that matches wall motion abnormality seen on echo in 2016.  Will continue ASA, Plavix, statin and beta blocker.     2. Ischemic cardiomyopathy/Chronic systolic CHF: Echo July [id number] with LVEF=40-45%. LVEF of 35% by nuclear stress test in 2019. Weight is stable. No evidence of volume overload.  ICD is in place. Will continue beta blocker and Ace-inh. Will repeat echo now.    3. HTN: BP is controlled. No changes  4. HLD: His lipids are followed in primary care. Will continue statin  5. Complete heart block: ICD in place. Followed in EP/Pacer clinic.   6. Aortic valve disease: Mild AI by echo in 2016. Will repeat echo now  Current medicines are reviewed at length with the patient today.  The patient does not have concerns regarding medicines.  The following changes have been made:  no change  Labs/ tests ordered today include:   Orders Placed This Encounter  Procedures  . ECHOCARDIOGRAM COMPLETE    Disposition:   FU with me in 12 months  Signed, Lauree Chandler, MD 04/29/2019 4:52 PM    [facility] [place], [place], [place]  [address] Phone: [phone]; Fax: [phone]

## 2019-04-29 NOTE — Patient Instructions (Signed)
Medication Instructions:  Your provider recommends that you continue on your current medications as directed. Please refer to the Current Medication list given to you today.    Labwork: None  Testing/Procedures: Your provider has requested that you have an echocardiogram. Echocardiography is a painless test that uses sound waves to create images of your heart. It provides your doctor with information about the size and shape of your heart and how well your heart's chambers and valves are working. This procedure takes approximately one hour. There are no restrictions for this procedure.     Follow-Up: Your provider wants you to follow-up in: 1 year with Dr. Angelena Form. You will receive a reminder letter in the mail two months in advance. If you don't receive a letter, please call our office to schedule the follow-up appointment.    Any Other Special Instructions Will Be Listed Below (If Applicable).     If you need a refill on your cardiac medications before your next appointment, please call your pharmacy.

## 2019-04-30 ENCOUNTER — Ambulatory Visit (INDEPENDENT_AMBULATORY_CARE_PROVIDER_SITE_OTHER): Payer: Medicare Other

## 2019-04-30 DIAGNOSIS — E538 Deficiency of other specified B group vitamins: Secondary | ICD-10-CM

## 2019-04-30 DIAGNOSIS — Z23 Encounter for immunization: Secondary | ICD-10-CM

## 2019-04-30 MED ORDER — CYANOCOBALAMIN 1000 MCG/ML IJ SOLN
1000.0000 ug | Freq: Once | INTRAMUSCULAR | Status: AC
  Administered 2019-04-30: 16:00:00 1000 ug via INTRAMUSCULAR

## 2019-04-30 NOTE — Progress Notes (Signed)
Medical screening examination/treatment/procedure(s) were performed by non-physician practitioner and as supervising physician I was immediately available for consultation/collaboration. I agree with above. Taran Hable, MD   

## 2019-05-04 ENCOUNTER — Ambulatory Visit (HOSPITAL_COMMUNITY): Payer: Medicare Other | Attending: Internal Medicine

## 2019-05-04 ENCOUNTER — Other Ambulatory Visit: Payer: Self-pay

## 2019-05-04 DIAGNOSIS — I351 Nonrheumatic aortic (valve) insufficiency: Secondary | ICD-10-CM | POA: Diagnosis not present

## 2019-05-28 ENCOUNTER — Other Ambulatory Visit: Payer: Self-pay

## 2019-05-28 ENCOUNTER — Ambulatory Visit (INDEPENDENT_AMBULATORY_CARE_PROVIDER_SITE_OTHER): Payer: Medicare Other

## 2019-05-28 DIAGNOSIS — E538 Deficiency of other specified B group vitamins: Secondary | ICD-10-CM | POA: Diagnosis not present

## 2019-05-28 MED ORDER — CYANOCOBALAMIN 1000 MCG/ML IJ SOLN
1000.0000 ug | Freq: Once | INTRAMUSCULAR | Status: AC
  Administered 2019-05-28: 12:00:00 1000 ug via INTRAMUSCULAR

## 2019-05-28 NOTE — Progress Notes (Signed)
Medical screening examination/treatment/procedure(s) were performed by non-physician practitioner and as supervising physician I was immediately available for consultation/collaboration. I agree with above. Kailynn Satterly, MD   

## 2019-06-22 DIAGNOSIS — Z8546 Personal history of malignant neoplasm of prostate: Secondary | ICD-10-CM | POA: Diagnosis not present

## 2019-06-25 ENCOUNTER — Ambulatory Visit (INDEPENDENT_AMBULATORY_CARE_PROVIDER_SITE_OTHER): Payer: Medicare Other

## 2019-06-25 ENCOUNTER — Other Ambulatory Visit: Payer: Self-pay

## 2019-06-25 DIAGNOSIS — E538 Deficiency of other specified B group vitamins: Secondary | ICD-10-CM | POA: Diagnosis not present

## 2019-06-25 MED ORDER — CYANOCOBALAMIN 1000 MCG/ML IJ SOLN
1000.0000 ug | Freq: Once | INTRAMUSCULAR | Status: AC
  Administered 2019-06-25: 1000 ug via INTRAMUSCULAR

## 2019-06-25 NOTE — Progress Notes (Signed)
Medical screening examination/treatment/procedure(s) were performed by non-physician practitioner and as supervising physician I was immediately available for consultation/collaboration. I agree with above. Sharod Petsch, MD   

## 2019-06-29 DIAGNOSIS — Z8546 Personal history of malignant neoplasm of prostate: Secondary | ICD-10-CM | POA: Diagnosis not present

## 2019-07-07 ENCOUNTER — Other Ambulatory Visit: Payer: Self-pay | Admitting: Internal Medicine

## 2019-07-16 ENCOUNTER — Ambulatory Visit (INDEPENDENT_AMBULATORY_CARE_PROVIDER_SITE_OTHER): Payer: Medicare Other | Admitting: *Deleted

## 2019-07-16 DIAGNOSIS — Z95810 Presence of automatic (implantable) cardiac defibrillator: Secondary | ICD-10-CM | POA: Diagnosis not present

## 2019-07-16 LAB — CUP PACEART REMOTE DEVICE CHECK
Battery Remaining Longevity: 91 mo
Battery Remaining Percentage: 95.5 %
Battery Voltage: 2.99 V
Brady Statistic AP VP Percent: 80 %
Brady Statistic AP VS Percent: 1 %
Brady Statistic AS VP Percent: 19 %
Brady Statistic AS VS Percent: 1 %
Brady Statistic RA Percent Paced: 79 %
Date Time Interrogation Session: 20201210031145
Implantable Lead Implant Date: 20050509
Implantable Lead Implant Date: 20050509
Implantable Lead Implant Date: 20200116
Implantable Lead Location: 753858
Implantable Lead Location: 753859
Implantable Lead Location: 753860
Implantable Lead Model: 1580
Implantable Pulse Generator Implant Date: 20200116
Lead Channel Impedance Value: 280 Ohm
Lead Channel Impedance Value: 390 Ohm
Lead Channel Impedance Value: 700 Ohm
Lead Channel Pacing Threshold Amplitude: 0.625 V
Lead Channel Pacing Threshold Amplitude: 0.875 V
Lead Channel Pacing Threshold Amplitude: 1 V
Lead Channel Pacing Threshold Pulse Width: 0.5 ms
Lead Channel Pacing Threshold Pulse Width: 0.5 ms
Lead Channel Pacing Threshold Pulse Width: 1 ms
Lead Channel Sensing Intrinsic Amplitude: 1.6 mV
Lead Channel Sensing Intrinsic Amplitude: 11.7 mV
Lead Channel Setting Pacing Amplitude: 1.5 V
Lead Channel Setting Pacing Amplitude: 1.625
Lead Channel Setting Pacing Amplitude: 2 V
Lead Channel Setting Pacing Pulse Width: 0.5 ms
Lead Channel Setting Pacing Pulse Width: 1 ms
Lead Channel Setting Sensing Sensitivity: 4 mV
Pulse Gen Serial Number: 9097773

## 2019-07-24 ENCOUNTER — Other Ambulatory Visit: Payer: Self-pay

## 2019-07-24 ENCOUNTER — Encounter: Payer: Self-pay | Admitting: Internal Medicine

## 2019-07-24 ENCOUNTER — Ambulatory Visit (INDEPENDENT_AMBULATORY_CARE_PROVIDER_SITE_OTHER): Payer: Medicare Other | Admitting: Internal Medicine

## 2019-07-24 VITALS — BP 122/84 | HR 84 | Temp 97.8°F | Ht 71.5 in | Wt 165.0 lb

## 2019-07-24 DIAGNOSIS — I10 Essential (primary) hypertension: Secondary | ICD-10-CM

## 2019-07-24 DIAGNOSIS — I255 Ischemic cardiomyopathy: Secondary | ICD-10-CM

## 2019-07-24 DIAGNOSIS — I2510 Atherosclerotic heart disease of native coronary artery without angina pectoris: Secondary | ICD-10-CM | POA: Diagnosis not present

## 2019-07-24 DIAGNOSIS — R739 Hyperglycemia, unspecified: Secondary | ICD-10-CM

## 2019-07-24 DIAGNOSIS — E538 Deficiency of other specified B group vitamins: Secondary | ICD-10-CM

## 2019-07-24 DIAGNOSIS — E785 Hyperlipidemia, unspecified: Secondary | ICD-10-CM | POA: Diagnosis not present

## 2019-07-24 MED ORDER — SIMVASTATIN 40 MG PO TABS: 40.0000 mg | ORAL_TABLET | Freq: Every day | ORAL | 3 refills | Status: DC

## 2019-07-24 MED ORDER — RAMIPRIL 10 MG PO CAPS: 10.0000 mg | ORAL_CAPSULE | Freq: Every day | ORAL | 3 refills | Status: DC

## 2019-07-24 MED ORDER — OMEPRAZOLE 20 MG PO CPDR: 20.0000 mg | DELAYED_RELEASE_CAPSULE | Freq: Every day | ORAL | 3 refills | Status: DC

## 2019-07-24 MED ORDER — METOPROLOL SUCCINATE ER 50 MG PO TB24: 50.0000 mg | ORAL_TABLET | Freq: Every day | ORAL | 3 refills | Status: DC

## 2019-07-24 MED ORDER — CLOPIDOGREL BISULFATE 75 MG PO TABS: 75.0000 mg | ORAL_TABLET | Freq: Every day | ORAL | 3 refills | Status: DC

## 2019-07-24 MED ORDER — LEVOTHYROXINE SODIUM 50 MCG PO TABS: 50.0000 ug | ORAL_TABLET | Freq: Every day | ORAL | 3 refills | Status: DC

## 2019-07-24 NOTE — Progress Notes (Signed)
Subjective:    Patient ID: [name], male    DOB: 1931-09-15, 85 y.o.   MRN: [id number]  HPI   Here to f/u; overall doing ok,  Pt denies chest pain, increasing sob or doe, wheezing, orthopnea, PND, increased LE swelling, palpitations, dizziness or syncope.  Pt denies new neurological symptoms such as new headache, or facial or extremity weakness or numbness.  Pt denies polydipsia, polyuria, or low sugar episode.  Pt states overall good compliance with meds, mostly trying to follow appropriate diet, with wt overall stable,  but little exercise however.  Has had very good compliance with b12 otc oral replacement.  No new complaints Past Medical History:  Diagnosis Date  . AICD (automatic cardioverter/defibrillator) present    "turned it off today, put in an extra lead,  and made it just a pacemaker" (08/21/2018)  . Anemia, unspecified   . Aortic stenosis    Mild, echo, October, 2011  . Arthritis    "joints; arms"  (08/21/2018)  . AV block, complete (HCC)    Permanent pacemaker  . CAD (coronary artery disease)    DES to LAD 2005 /  DES to LAD 2008  . Cardiomyopathy    Ischemic, EF 30%, 2005  . Chronic bronchitis ([place])   . DJD (degenerative joint disease)   . Dyslipidemia   . Ejection fraction < 50%    EF 30%, echo, 2005  /  EF 30%, catheterization, 2008  . GERD (gastroesophageal reflux disease)   . Hyperlipidemia   . Hypertension   . Hypothyroidism   . ICD (implantable cardiac defibrillator), single, in situ    for syncope and arrhythmia 2005 / generator change June, 2011  . Iron deficiency anemia 06/17/2018  . LBBB (left bundle branch block)   . Mitral regurgitation    Mild, echo, October, 2011  . Myocardial infarction ([place]) 1989  . Presence of permanent cardiac pacemaker   . Prostate cancer ([place])   . Rectal fissure   . Syncope    2005, ICD placed  . Systolic CHF, chronic ([place])   . Vitamin B12 deficiency    Past Surgical History:  Procedure Laterality Date  . BIV  UPGRADE  08/21/2018  . BIV UPGRADE N/A 08/21/2018   Procedure: BIV UPGRADE;  Surgeon: Evans Lance, MD;  Location: [facility];  Service: Cardiovascular;  Laterality: N/A;  . CARDIAC CATHETERIZATION  2008  . ICD IMPLANT  2005  . INSERTION PROSTATE RADIATION SEED    . LAPAROSCOPIC CHOLECYSTECTOMY  04/2005   Dr. Lucia Gaskins  . PROSTATE BIOPSY    . RECTAL DILATATION     "stretched it twice" (08/21/2018)  . SHOULDER OPEN ROTATOR CUFF REPAIR Right     reports that he quit smoking about 67 years ago. His smoking use included cigarettes. He has a 2.00 pack-year smoking history. He has never used smokeless tobacco. He reports that he does not drink alcohol or use drugs. family history includes Breast cancer in his maternal aunt; Cancer (age of onset: 1) in his mother; Heart Problems in his brother and father; Heart attack in his brother; Heart attack (age of onset: 27) in his father; Lung cancer in his brother and sister; Other in his sister; Stroke in his brother and mother. Allergies  Allergen Reactions  . Pantoprazole Other (See Comments)    B12 levels dropped on medication   Current Outpatient Medications on File Prior to Visit  Medication Sig Dispense Refill  . acetaminophen (TYLENOL) 650 MG  CR tablet Take 1,300 mg by mouth 2 (two) times daily.    Marland Kitchen aspirin 81 MG tablet Take 81 mg by mouth daily.      Marland Kitchen CALCIUM PO Take 2 tablets by mouth daily.     . Cholecalciferol (VITAMIN D) 50 MCG (2000 UT) CAPS Take 2,000 Units by mouth daily.    . cyanocobalamin (,VITAMIN B-12,) 1000 MCG/ML injection INJECT 1 ML INTO THE MUSCLE EVERY 30 DAYS 1 mL 5  . fish oil-omega-3 fatty acids 1000 MG capsule Take 1 g by mouth 2 (two) times daily.     Marland Kitchen gemfibrozil (LOPID) 600 MG tablet TAKE 1 TABLET DAILY 90 tablet 1  . Multiple Vitamins-Minerals (MENS MULTIVITAMIN PLUS PO) Take 1 tablet by mouth daily.      . polyethylene glycol (MIRALAX / GLYCOLAX) packet Take 17 g by mouth See admin instructions. Take 17g  mixed with liquid once daily, may do a second dose as needed for constipation     No current facility-administered medications on file prior to visit.   Review of Systems  Constitutional: Negative for other unusual diaphoresis or sweats HENT: Negative for ear discharge or swelling Eyes: Negative for other worsening visual disturbances Respiratory: Negative for stridor or other swelling  Gastrointestinal: Negative for worsening distension or other blood Genitourinary: Negative for retention or other urinary change Musculoskeletal: Negative for other MSK pain or swelling Skin: Negative for color change or other new lesions Neurological: Negative for worsening tremors and other numbness  Psychiatric/Behavioral: Negative for worsening agitation or other fatigue All otherwise neg per pt     Objective:   Physical Exam BP 122/84   Pulse 84   Temp 97.8 F (36.6 C) (Oral)   Ht 5' 11.5" (1.816 m)   Wt 165 lb (74.8 kg)   SpO2 97%   BMI 22.69 kg/m  VS noted,  Constitutional: Pt appears in NAD HENT: Head: NCAT.  Right Ear: External ear normal.  Left Ear: External ear normal.  Eyes: . Pupils are equal, round, and reactive to light. Conjunctivae and EOM are normal Nose: without d/c or deformity Neck: Neck supple. Gross normal ROM Cardiovascular: Normal rate and regular rhythm.   Pulmonary/Chest: Effort normal and breath sounds without rales or wheezing.  Abd:  Soft, NT, ND, + BS, no organomegaly Neurological: Pt is alert. At baseline orientation, motor grossly intact Skin: Skin is warm. No rashes, other new lesions, no LE edema Psychiatric: Pt behavior is normal without agitation  All otherwise neg per pt  Lab Results  Component Value Date   WBC 3.6 (L) 01/30/2019   HGB 9.3 (L) 01/30/2019   HCT 26.2 Repeated and verified X2. (L) 01/30/2019   PLT 204.0 01/30/2019   GLUCOSE 90 01/30/2019   CHOL 166 01/30/2019   TRIG 166.0 (H) 01/30/2019   HDL 44.00 01/30/2019   LDLDIRECT 96.3  02/26/2011   LDLCALC 89 01/30/2019   ALT 10 01/30/2019   AST 20 01/30/2019   NA 142 01/30/2019   K 4.5 01/30/2019   CL 108 01/30/2019   CREATININE 1.48 01/30/2019   BUN 25 (H) 01/30/2019   CO2 26 01/30/2019   TSH 2.75 01/30/2019   PSA 12.25 (H) 03/09/2016   INR 0.95 01/06/2010   HGBA1C 5.5 01/30/2019       Assessment & Plan:

## 2019-07-24 NOTE — Patient Instructions (Signed)
Please continue all other medications as before, except it is fine to hold on the B12 shots for now, and we can recheck at your next visit  Please have the pharmacy call with any other refills you may need.  Please continue your efforts at being more active, low cholesterol diet, and weight control.  Please keep your appointments with your specialists as you may have planned  Please return in 6 months, or sooner if needed

## 2019-07-25 ENCOUNTER — Encounter: Payer: Self-pay | Admitting: Internal Medicine

## 2019-07-25 NOTE — Assessment & Plan Note (Signed)
stable overall by history and exam, recent data reviewed with pt, and pt to continue medical treatment as before,  to f/u any worsening symptoms or concerns  

## 2019-07-25 NOTE — Assessment & Plan Note (Signed)
Ok to change otc b12 supplement with hold x 6 mo, then restart

## 2019-07-27 ENCOUNTER — Telehealth: Payer: Self-pay | Admitting: Internal Medicine

## 2019-07-27 NOTE — Telephone Encounter (Signed)
Copied from [place] [id number]. Topic: General - Other >> Jul 27, 2019  3:47 PM Keene Breath wrote: Reason for CRM: Patient would like a small amount (10) pills of his metoprolol succinate (TOPROL-XL) 50 MG 24 hr tablet, until his mail delivery comes in.  [facility]  CB# [phone] Azucena Cecil.)

## 2019-07-28 MED ORDER — METOPROLOL SUCCINATE ER 50 MG PO TB24: 50.0000 mg | ORAL_TABLET | Freq: Every day | ORAL | 0 refills | Status: DC

## 2019-07-28 NOTE — Telephone Encounter (Signed)
Notified [name] [facility].Marland KitchenJohny Blevins

## 2019-08-04 ENCOUNTER — Ambulatory Visit: Payer: Medicare Other | Admitting: Internal Medicine

## 2019-08-22 NOTE — Progress Notes (Signed)
ICD remote 

## 2019-08-25 DIAGNOSIS — Z23 Encounter for immunization: Secondary | ICD-10-CM | POA: Diagnosis not present

## 2019-09-23 ENCOUNTER — Telehealth: Payer: Self-pay

## 2019-09-23 ENCOUNTER — Other Ambulatory Visit: Payer: Self-pay

## 2019-09-23 DIAGNOSIS — Z23 Encounter for immunization: Secondary | ICD-10-CM | POA: Diagnosis not present

## 2019-09-23 MED ORDER — LEVOTHYROXINE SODIUM 50 MCG PO TABS: 50.0000 ug | ORAL_TABLET | Freq: Every day | ORAL | 3 refills | Status: DC

## 2019-09-23 NOTE — Telephone Encounter (Signed)
Blue rx plan has expired / no coverage on  08/06/19   Medication Requested: levothyroxine (SYNTHROID) 50 MCG tablet  Is medication on med list:  Yes  (if no, inform pt they may need an appointment)  Is medication a controled: No  (yes = last OV with PCP)  -Controlled Substances: Adderall, Ritalin, oxycodone, hydrocodone, methadone, alprazolam, etc  Last visit with PCP: Last seen Dr. Jenny Reichmann  08/04/19   Is the OV > than 4 months: (yes = schedule an appt if one is not already made)  Pharmacy (Name, Street, [place] [address] [place]

## 2019-09-23 NOTE — Telephone Encounter (Signed)
[facility] [address]

## 2019-10-03 IMAGING — US US THYROID
1 series · 14 of 25 positions shown · non-contrast
Comparison: None.

CLINICAL DATA: Goiter.

EXAM:
THYROID ULTRASOUND
TECHNIQUE: Ultrasound examination of the thyroid gland and adjacent soft
tissues was performed.

[Series 1: us thyroid · 0.08mm/px · 14 of 34 slices shown]
[im 1/34]
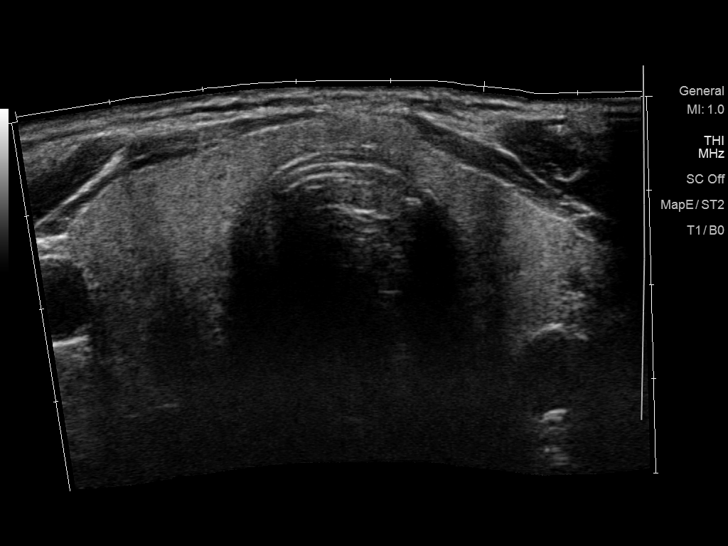
[im 3/34]
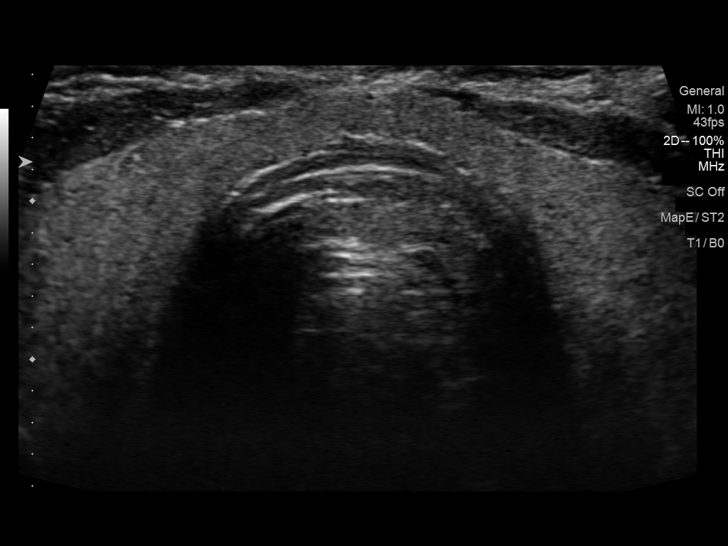
[im 6/34]
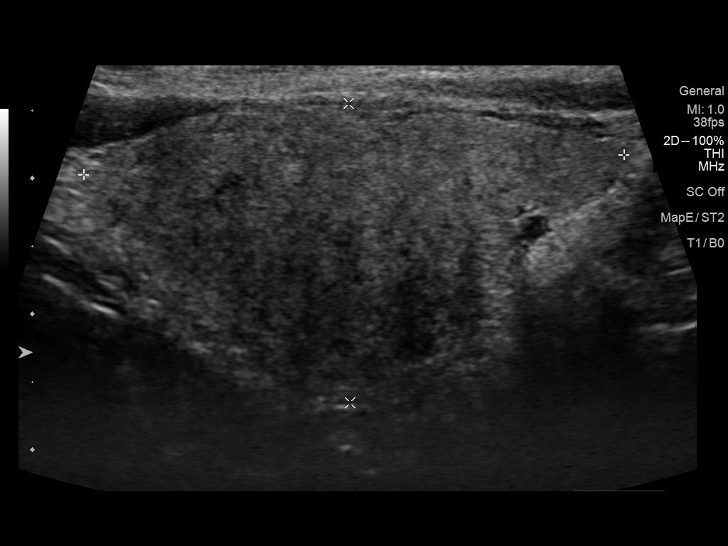
[im 9/34]
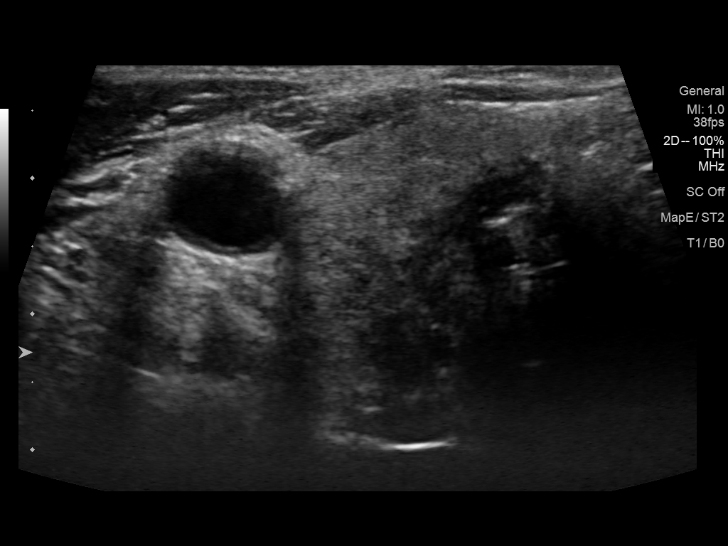
[im 12/34]
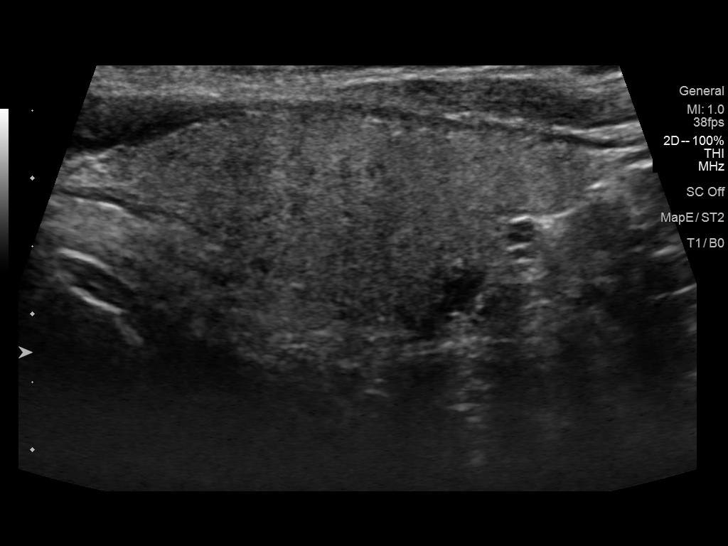
[im 13/34]
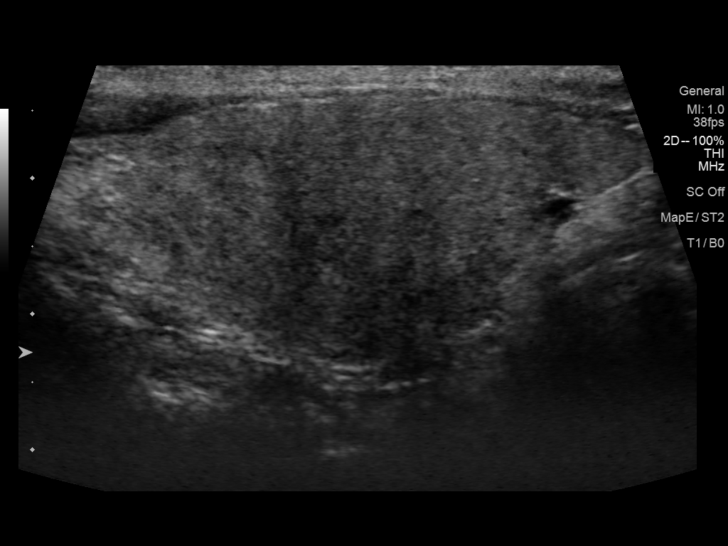
[im 16/34]
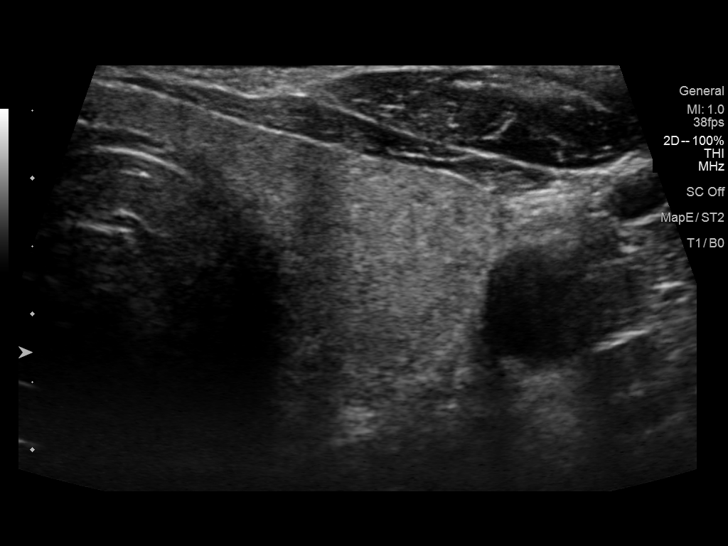
[im 18/34]
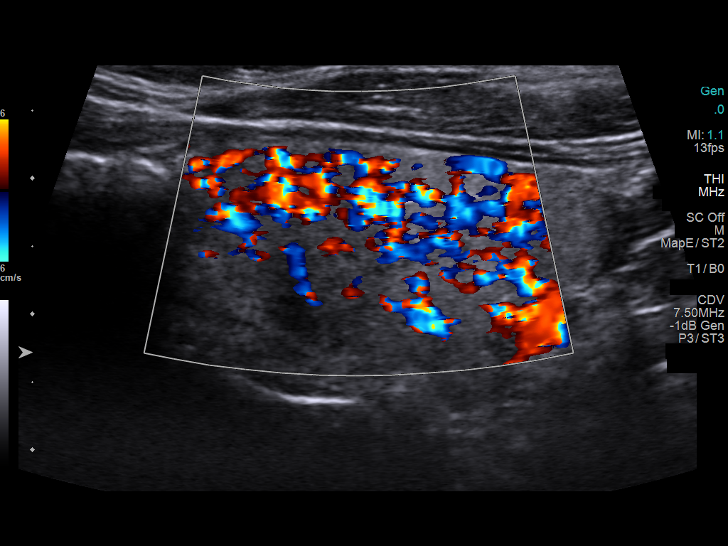
[im 21/34]
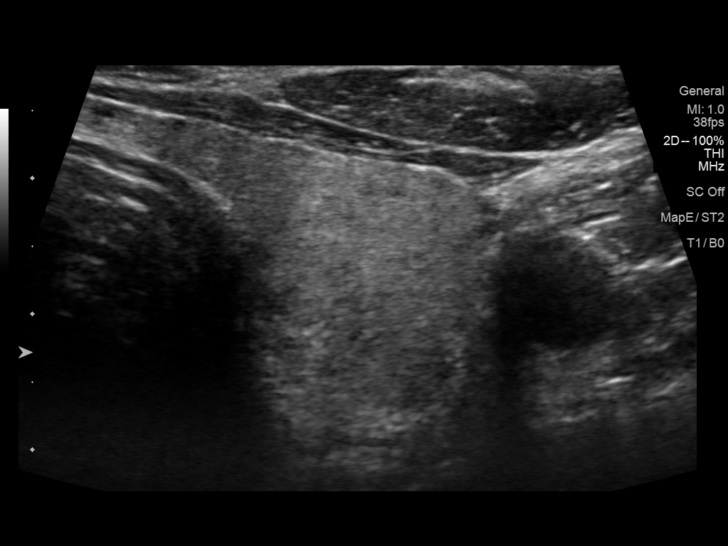
[im 23/34]
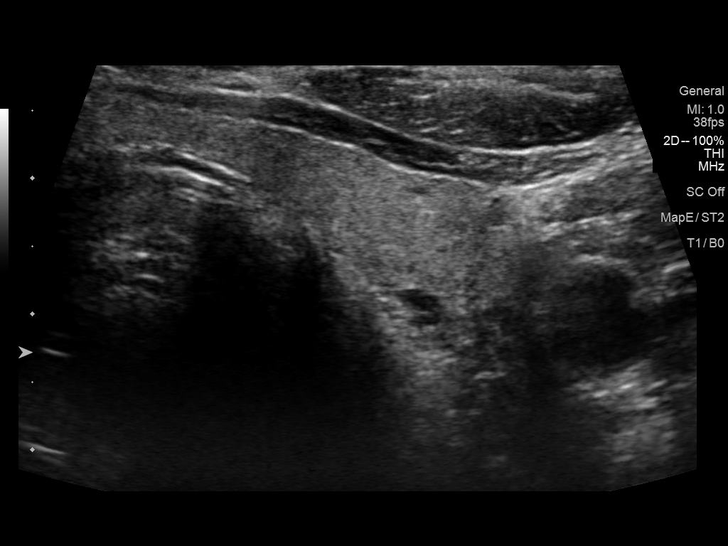
[im 25/34]
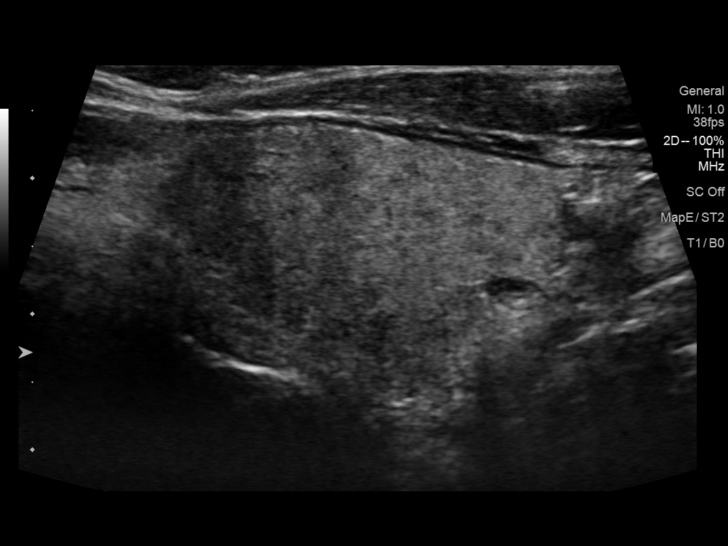
[im 28/34]
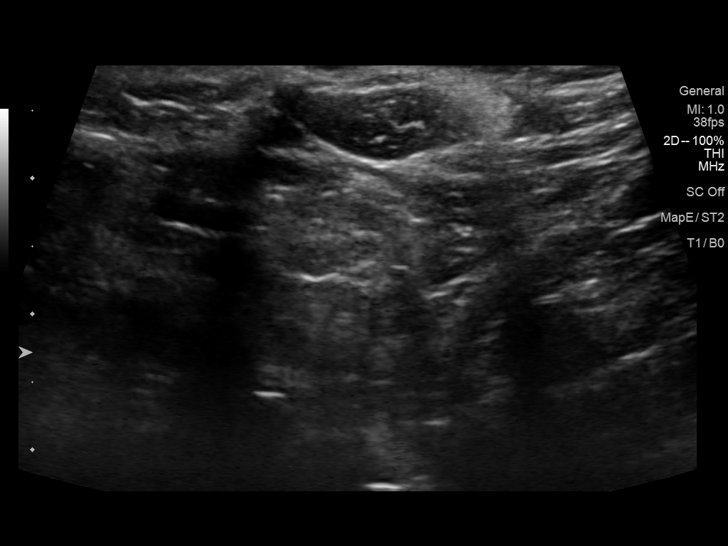
[im 31/34]
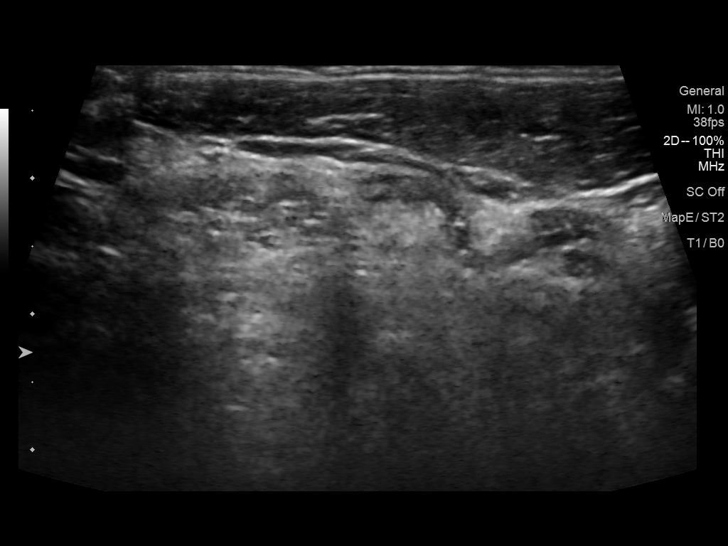
[im 34/34]
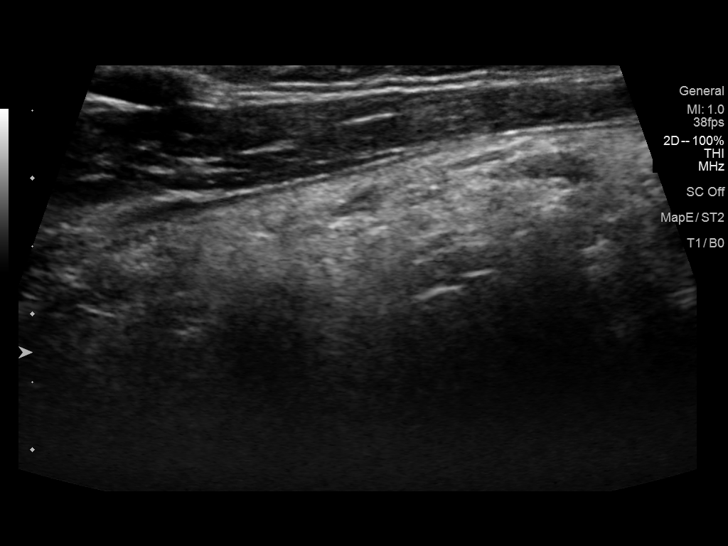

[14 of 25 positions shown; findings below may reference images not displayed]

FINDINGS: Parenchymal Echotexture: Mildly heterogenous

Isthmus: 0.3 cm

Right lobe: 4.0 x 2.2 x 2.0 cm

Left lobe: 3.3 x 1.6 x 1.8 cm

_________________________________________________________

Estimated total number of nodules >/= 1 cm: 0

Number of spongiform nodules >/=  2 cm not described below (TR1): 0

Number of mixed cystic and solid nodules >/= 1.5 cm not described
below (TR2): 0

_________________________________________________________

No discrete nodules are seen within the thyroid gland.
IMPRESSION: Mildly heterogeneous thyroid gland without evidence of discrete
thyroid nodule.

## 2019-10-07 ENCOUNTER — Telehealth: Payer: Self-pay

## 2019-10-07 NOTE — Telephone Encounter (Signed)
1.Medication Requested:  omeprazole (PRILOSEC) 20 MG capsule  simvastatin (ZOCOR) 40 MG tablet  gemfibrozil (LOPID) 600 MG tablet  ramipril (ALTACE) 10 MG capsule  metoprolol succinate (TOPROL-XL) 50 MG 24 hr tablet  clopidogrel (PLAVIX) 75 MG tablet  levothyroxine (SYNTHROID) 50 MCG tablet  2. Pharmacy (Name, Street, [place]): [facility] order   - Call  [phone] - Fax   [phone] -[place] - location [place].   3. On Med List: Yes   4. Last Visit with PCP:  12.18.20  5. Next visit date with PCP: 6.18.2021   6. 90 days supply with 3 refills.     Agent: Please be advised that RX refills may take up to 3 business days. We ask that you follow-up with your pharmacy.

## 2019-10-08 ENCOUNTER — Other Ambulatory Visit: Payer: Self-pay

## 2019-10-08 DIAGNOSIS — I2510 Atherosclerotic heart disease of native coronary artery without angina pectoris: Secondary | ICD-10-CM

## 2019-10-08 MED ORDER — LEVOTHYROXINE SODIUM 50 MCG PO TABS: 50.0000 ug | ORAL_TABLET | Freq: Every day | ORAL | 3 refills | Status: DC

## 2019-10-08 MED ORDER — RAMIPRIL 10 MG PO CAPS: 10.0000 mg | ORAL_CAPSULE | Freq: Every day | ORAL | 3 refills | Status: DC

## 2019-10-08 MED ORDER — METOPROLOL SUCCINATE ER 50 MG PO TB24: 50.0000 mg | ORAL_TABLET | Freq: Every day | ORAL | 0 refills | Status: DC

## 2019-10-08 MED ORDER — CLOPIDOGREL BISULFATE 75 MG PO TABS: 75.0000 mg | ORAL_TABLET | Freq: Every day | ORAL | 3 refills | Status: DC

## 2019-10-08 MED ORDER — OMEPRAZOLE 20 MG PO CPDR: 20.0000 mg | DELAYED_RELEASE_CAPSULE | Freq: Every day | ORAL | 3 refills | Status: DC

## 2019-10-08 MED ORDER — GEMFIBROZIL 600 MG PO TABS: 600.0000 mg | ORAL_TABLET | Freq: Every day | ORAL | 1 refills | Status: DC

## 2019-10-08 MED ORDER — SIMVASTATIN 40 MG PO TABS: 40.0000 mg | ORAL_TABLET | Freq: Every day | ORAL | 3 refills | Status: DC

## 2019-10-08 NOTE — Telephone Encounter (Signed)
Refills sent to update mail order pharmacy today.

## 2019-10-10 ENCOUNTER — Other Ambulatory Visit: Payer: Self-pay | Admitting: Internal Medicine

## 2019-10-10 NOTE — Telephone Encounter (Signed)
Please refill as per office routine med refill policy (all routine meds refilled for 3 mo or monthly per pt preference up to one year from last visit, then month to month grace period for 3 mo, then further med refills will have to be denied)  

## 2019-10-15 ENCOUNTER — Telehealth: Payer: Self-pay | Admitting: Internal Medicine

## 2019-10-15 ENCOUNTER — Ambulatory Visit (INDEPENDENT_AMBULATORY_CARE_PROVIDER_SITE_OTHER): Payer: Medicare Other | Admitting: *Deleted

## 2019-10-15 DIAGNOSIS — Z95810 Presence of automatic (implantable) cardiac defibrillator: Secondary | ICD-10-CM

## 2019-10-15 LAB — CUP PACEART REMOTE DEVICE CHECK
Battery Remaining Longevity: 95 mo
Battery Remaining Percentage: 95.5 %
Battery Voltage: 2.99 V
Brady Statistic AP VP Percent: 81 %
Brady Statistic AP VS Percent: 1 %
Brady Statistic AS VP Percent: 19 %
Brady Statistic AS VS Percent: 1 %
Brady Statistic RA Percent Paced: 80 %
Date Time Interrogation Session: 20210311020014
Implantable Lead Implant Date: 20050509
Implantable Lead Implant Date: 20050509
Implantable Lead Implant Date: 20200116
Implantable Lead Location: 753858
Implantable Lead Location: 753859
Implantable Lead Location: 753860
Implantable Lead Model: 1580
Implantable Pulse Generator Implant Date: 20200116
Lead Channel Impedance Value: 280 Ohm
Lead Channel Impedance Value: 400 Ohm
Lead Channel Impedance Value: 850 Ohm
Lead Channel Pacing Threshold Amplitude: 0.625 V
Lead Channel Pacing Threshold Amplitude: 0.875 V
Lead Channel Pacing Threshold Amplitude: 1 V
Lead Channel Pacing Threshold Pulse Width: 0.5 ms
Lead Channel Pacing Threshold Pulse Width: 0.5 ms
Lead Channel Pacing Threshold Pulse Width: 1 ms
Lead Channel Sensing Intrinsic Amplitude: 1.9 mV
Lead Channel Sensing Intrinsic Amplitude: 11.7 mV
Lead Channel Setting Pacing Amplitude: 1.5 V
Lead Channel Setting Pacing Amplitude: 1.625
Lead Channel Setting Pacing Amplitude: 2 V
Lead Channel Setting Pacing Pulse Width: 0.5 ms
Lead Channel Setting Pacing Pulse Width: 1 ms
Lead Channel Setting Sensing Sensitivity: 4 mV
Pulse Gen Model: 3562
Pulse Gen Serial Number: 9097773

## 2019-10-15 NOTE — Telephone Encounter (Signed)
    Pt c/o medication issue:  1. Name of Medication: gemfibrozil (LOPID) 600 MG tablet / simvastatin (ZOCOR) 40 MG tablet  2. How are you currently taking this medication (dosage and times per day)? As written  3. Are you having a reaction (difficulty breathing--STAT)? No  4. What is your medication issue?  Pharmacist calling to report drug interactions (rx [id number]) between Lopid and Zocor.  Please call [phone] to speak with pharmacist

## 2019-10-16 DIAGNOSIS — M25512 Pain in left shoulder: Secondary | ICD-10-CM | POA: Diagnosis not present

## 2019-10-16 DIAGNOSIS — M542 Cervicalgia: Secondary | ICD-10-CM | POA: Diagnosis not present

## 2019-10-16 MED ORDER — SIMVASTATIN 40 MG PO TABS: 40.0000 mg | ORAL_TABLET | Freq: Every day | ORAL | 3 refills | Status: DC

## 2019-10-16 MED ORDER — GEMFIBROZIL 600 MG PO TABS: 600.0000 mg | ORAL_TABLET | Freq: Every day | ORAL | 3 refills | Status: DC

## 2019-10-16 NOTE — Telephone Encounter (Signed)
Ok to take together for this pt  rx have been done to mail pharmacy

## 2019-10-16 NOTE — Telephone Encounter (Signed)
Pharmacist has been informed

## 2019-10-16 NOTE — Addendum Note (Signed)
Addended by: Biagio Borg on: 10/16/2019 01:17 PM   Modules accepted: Orders

## 2019-10-16 NOTE — Progress Notes (Signed)
ICD Remote  

## 2019-11-03 DIAGNOSIS — M25512 Pain in left shoulder: Secondary | ICD-10-CM | POA: Diagnosis not present

## 2019-11-05 DIAGNOSIS — Z8546 Personal history of malignant neoplasm of prostate: Secondary | ICD-10-CM | POA: Diagnosis not present

## 2019-11-05 DIAGNOSIS — N481 Balanitis: Secondary | ICD-10-CM | POA: Diagnosis not present

## 2019-11-09 ENCOUNTER — Telehealth: Payer: Self-pay

## 2019-11-09 NOTE — Telephone Encounter (Signed)
1.Medication Requested:metoprolol succinate (TOPROL-XL) 50 MG 24 hr tablet  2. Pharmacy (Name, Street, [place]):[facility] [place] [place]  3. On Med List:   4. Last Visit with PCP: 12.18.20  5. Next visit date with PCP: 6.18.21    Agent: Please be advised that RX refills may take up to 3 business days. We ask that you follow-up with your pharmacy.

## 2019-11-10 ENCOUNTER — Other Ambulatory Visit: Payer: Self-pay

## 2019-11-10 MED ORDER — METOPROLOL SUCCINATE ER 50 MG PO TB24: 50.0000 mg | ORAL_TABLET | Freq: Every day | ORAL | 0 refills | Status: DC

## 2019-11-10 NOTE — Telephone Encounter (Signed)
Per office policy sent 90 day to [name] until appt...[name]

## 2019-11-10 NOTE — Telephone Encounter (Signed)
[facility]../lmb 

## 2019-11-10 NOTE — Telephone Encounter (Signed)
[facility]   Daughter is calling back   1.Medication Requested:metoprolol succinate (TOPROL-XL) 50 MG 24 hr tablet  2. Pharmacy (Name, Street, [place]):[facility] on [address]   3. On Med List: Yes   4. Last Visit with PCP: 12.18.20  5. Next visit date with PCP:6 .18.21    Agent: Please be advised that RX refills may take up to 3 business days. We ask that you follow-up with your pharmacy.

## 2019-11-18 DIAGNOSIS — N485 Ulcer of penis: Secondary | ICD-10-CM | POA: Diagnosis not present

## 2019-12-03 DIAGNOSIS — Z8546 Personal history of malignant neoplasm of prostate: Secondary | ICD-10-CM | POA: Diagnosis not present

## 2019-12-10 DIAGNOSIS — Z8546 Personal history of malignant neoplasm of prostate: Secondary | ICD-10-CM | POA: Diagnosis not present

## 2019-12-10 DIAGNOSIS — N481 Balanitis: Secondary | ICD-10-CM | POA: Diagnosis not present

## 2019-12-15 DIAGNOSIS — L0889 Other specified local infections of the skin and subcutaneous tissue: Secondary | ICD-10-CM | POA: Diagnosis not present

## 2019-12-15 DIAGNOSIS — L720 Epidermal cyst: Secondary | ICD-10-CM | POA: Diagnosis not present

## 2020-01-14 ENCOUNTER — Ambulatory Visit (INDEPENDENT_AMBULATORY_CARE_PROVIDER_SITE_OTHER): Payer: Medicare Other | Admitting: *Deleted

## 2020-01-14 DIAGNOSIS — I255 Ischemic cardiomyopathy: Secondary | ICD-10-CM

## 2020-01-14 LAB — CUP PACEART REMOTE DEVICE CHECK
Battery Remaining Longevity: 92 mo
Battery Remaining Percentage: 95.5 %
Battery Voltage: 2.99 V
Brady Statistic AP VP Percent: 79 %
Brady Statistic AP VS Percent: 1 %
Brady Statistic AS VP Percent: 21 %
Brady Statistic AS VS Percent: 1 %
Brady Statistic RA Percent Paced: 78 %
Date Time Interrogation Session: 20210610033727
Implantable Lead Implant Date: 20050509
Implantable Lead Implant Date: 20050509
Implantable Lead Implant Date: 20200116
Implantable Lead Location: 753858
Implantable Lead Location: 753859
Implantable Lead Location: 753860
Implantable Lead Model: 1580
Implantable Pulse Generator Implant Date: 20200116
Lead Channel Impedance Value: 280 Ohm
Lead Channel Impedance Value: 380 Ohm
Lead Channel Impedance Value: 750 Ohm
Lead Channel Pacing Threshold Amplitude: 0.625 V
Lead Channel Pacing Threshold Amplitude: 0.875 V
Lead Channel Pacing Threshold Amplitude: 1 V
Lead Channel Pacing Threshold Pulse Width: 0.5 ms
Lead Channel Pacing Threshold Pulse Width: 0.5 ms
Lead Channel Pacing Threshold Pulse Width: 1 ms
Lead Channel Sensing Intrinsic Amplitude: 1.3 mV
Lead Channel Sensing Intrinsic Amplitude: 11.7 mV
Lead Channel Setting Pacing Amplitude: 1.5 V
Lead Channel Setting Pacing Amplitude: 1.625
Lead Channel Setting Pacing Amplitude: 2 V
Lead Channel Setting Pacing Pulse Width: 0.5 ms
Lead Channel Setting Pacing Pulse Width: 1 ms
Lead Channel Setting Sensing Sensitivity: 4 mV
Pulse Gen Model: 3562
Pulse Gen Serial Number: 9097773

## 2020-01-15 NOTE — Progress Notes (Signed)
Remote ICD transmission.   

## 2020-01-20 ENCOUNTER — Ambulatory Visit (INDEPENDENT_AMBULATORY_CARE_PROVIDER_SITE_OTHER): Payer: Medicare Other | Admitting: Internal Medicine

## 2020-01-20 ENCOUNTER — Other Ambulatory Visit: Payer: Self-pay

## 2020-01-20 VITALS — BP 148/66 | HR 54 | Ht 71.5 in | Wt 170.0 lb

## 2020-01-20 DIAGNOSIS — I255 Ischemic cardiomyopathy: Secondary | ICD-10-CM | POA: Diagnosis not present

## 2020-01-20 DIAGNOSIS — I5022 Chronic systolic (congestive) heart failure: Secondary | ICD-10-CM

## 2020-01-20 DIAGNOSIS — Z95810 Presence of automatic (implantable) cardiac defibrillator: Secondary | ICD-10-CM

## 2020-01-20 NOTE — Patient Instructions (Signed)
Medication Instructions:  Your physician recommends that you continue on your current medications as directed. Please refer to the Current Medication list given to you today.  Labwork: None ordered.  Testing/Procedures: None ordered.  Follow-Up: Your physician wants you to follow-up in: one year with Dr. [name].   You will receive a reminder letter in the mail two months in advance. If you don't receive a letter, please call our office to schedule the follow-up appointment.  Remote monitoring is used to monitor your ICD from home. This monitoring reduces the number of office visits required to check your device to one time per year. It allows [place] to keep an eye on the functioning of your device to ensure it is working properly. You are scheduled for a device check from home on 04/14/2020. You may send your transmission at any time that day. If you have a wireless device, the transmission will be sent automatically. After your physician reviews your transmission, you will receive a postcard with your next transmission date.  Any Other Special Instructions Will Be Listed Below (If Applicable).  If you need a refill on your cardiac medications before your next appointment, please call your pharmacy.

## 2020-01-20 NOTE — Progress Notes (Signed)
HPI [name] returns today for followup. He is a pleasant 86 yo man with a h/o [name], chronic systolic heart failure, HTN, and CHB. He is s/p Biv  PPM down grade. He has done well and has class 2 symptoms. He has no chest pain or sob.  Allergies  Allergen Reactions  . Pantoprazole Other (See Comments)    B12 levels dropped on medication     Current Outpatient Medications  Medication Sig Dispense Refill  . acetaminophen (TYLENOL) 650 MG CR tablet Take 1,300 mg by mouth 2 (two) times daily.    Marland Kitchen aspirin 81 MG tablet Take 81 mg by mouth daily.      Marland Kitchen CALCIUM PO Take 2 tablets by mouth daily.     . Cholecalciferol (VITAMIN D) 50 MCG (2000 UT) CAPS Take 2,000 Units by mouth daily.    . clopidogrel (PLAVIX) 75 MG tablet TAKE 1 TABLET BY MOUTH EVERY DAY 90 tablet 1  . cyanocobalamin (,VITAMIN B-12,) 1000 MCG/ML injection INJECT 1 ML INTO THE MUSCLE EVERY 30 DAYS 1 mL 5  . fish oil-omega-3 fatty acids 1000 MG capsule Take 1 g by mouth 2 (two) times daily.     Marland Kitchen gemfibrozil (LOPID) 600 MG tablet Take 1 tablet (600 mg total) by mouth daily. 90 tablet 3  . levothyroxine (SYNTHROID) 50 MCG tablet Take 1 tablet (50 mcg total) by mouth daily before breakfast. 90 tablet 3  . metoprolol succinate (TOPROL-XL) 50 MG 24 hr tablet Take 1 tablet (50 mg total) by mouth daily. Take with or immediately following a meal. Must keep scheduled appt for future refills 90 tablet 0  . Multiple Vitamins-Minerals (MENS MULTIVITAMIN PLUS PO) Take 1 tablet by mouth daily.      Marland Kitchen omeprazole (PRILOSEC) 20 MG capsule Take 1 capsule (20 mg total) by mouth daily. 90 capsule 3  . polyethylene glycol (MIRALAX / GLYCOLAX) packet Take 17 g by mouth See admin instructions. Take 17g mixed with liquid once daily, may do a second dose as needed for constipation    . ramipril (ALTACE) 10 MG capsule Take 1 capsule (10 mg total) by mouth daily. 90 capsule 3  . simvastatin (ZOCOR) 40 MG tablet Take 1 tablet (40 mg total) by  mouth at bedtime. 90 tablet 3   No current facility-administered medications for this visit.     Past Medical History:  Diagnosis Date  . AICD (automatic cardioverter/defibrillator) present    "turned it off today, put in an extra lead,  and made it just a pacemaker" (08/21/2018)  . Anemia, unspecified   . Aortic stenosis    Mild, echo, October, 2011  . Arthritis    "joints; arms"  (08/21/2018)  . AV block, complete (HCC)    Permanent pacemaker  . CAD (coronary artery disease)    DES to LAD 2005 /  DES to LAD 2008  . Cardiomyopathy    Ischemic, EF 30%, 2005  . Chronic bronchitis ([place])   . DJD (degenerative joint disease)   . Dyslipidemia   . Ejection fraction < 50%    EF 30%, echo, 2005  /  EF 30%, catheterization, 2008  . GERD (gastroesophageal reflux disease)   . Hyperlipidemia   . Hypertension   . Hypothyroidism   . ICD (implantable cardiac defibrillator), single, in situ    for syncope and arrhythmia 2005 / generator change June, 2011  . Iron deficiency anemia 06/17/2018  . LBBB (left bundle branch block)   .  Mitral regurgitation    Mild, echo, October, 2011  . Myocardial infarction ([place]) 1989  . Presence of permanent cardiac pacemaker   . Prostate cancer ([place])   . Rectal fissure   . Syncope    2005, ICD placed  . Systolic CHF, chronic ([place])   . Vitamin B12 deficiency     ROS:   All systems reviewed and negative except as noted in the HPI.   Past Surgical History:  Procedure Laterality Date  . BIV UPGRADE  08/21/2018  . BIV UPGRADE N/A 08/21/2018   Procedure: BIV UPGRADE;  Surgeon: Evans Lance, MD;  Location: [facility];  Service: Cardiovascular;  Laterality: N/A;  . CARDIAC CATHETERIZATION  2008  . ICD IMPLANT  2005  . INSERTION PROSTATE RADIATION SEED    . LAPAROSCOPIC CHOLECYSTECTOMY  04/2005   Dr. Lucia Gaskins  . PROSTATE BIOPSY    . RECTAL DILATATION     "stretched it twice" (08/21/2018)  . SHOULDER OPEN ROTATOR CUFF REPAIR Right       Family History  Problem Relation Age of Onset  . Stroke Mother   . Cancer Mother 27       unknown  . Heart attack Father 47  . Heart Problems Father   . Heart attack Brother   . Lung cancer Brother   . Stroke Brother   . Heart Problems Brother        CABG  . Lung cancer Sister   . Other Sister        PACEMAKER  . Breast cancer Maternal Aunt      Social History   Socioeconomic History  . Marital status: Widowed    Spouse name: Not on file  . Number of children: 2  . Years of education: Not on file  . Highest education level: Not on file  Occupational History  . Occupation: repairs looms at [employer]  Tobacco Use  . Smoking status: Former Smoker    Packs/day: 1.00    Years: 2.00    Pack years: 2.00    Types: Cigarettes    Quit date: 08/06/1952    Years since quitting: 67.5  . Smokeless tobacco: Never Used  Vaping Use  . Vaping Use: Never used  Substance and Sexual Activity  . Alcohol use: Never  . Drug use: Never  . Sexual activity: Not Currently  Other Topics Concern  . Not on file  Social History Narrative  . Not on file   Social Determinants of Health   Financial Resource Strain:   . Difficulty of Paying Living Expenses:   Food Insecurity:   . Worried About Charity fundraiser in the Last Year:   . Arboriculturist in the Last Year:   Transportation Needs:   . Film/video editor (Medical):   Marland Kitchen Lack of Transportation (Non-Medical):   Physical Activity:   . Days of Exercise per Week:   . Minutes of Exercise per Session:   Stress:   . Feeling of Stress :   Social Connections:   . Frequency of Communication with Friends and Family:   . Frequency of Social Gatherings with Friends and Family:   . Attends Religious Services:   . Active Member of Clubs or Organizations:   . Attends Archivist Meetings:   Marland Kitchen Marital Status:   Intimate Partner Violence:   . Fear of Current or Ex-Partner:   . Emotionally Abused:   Marland Kitchen Physically Abused:    . Sexually  Abused:      BP (!) 148/66   Pulse (!) 54   Ht 5' 11.5" (1.816 m)   Wt 170 lb (77.1 kg)   BMI 23.38 kg/m   Physical Exam:  Well appearing NAD HEENT: Unremarkable Neck:  No JVD, no thyromegally Lymphatics:  No adenopathy Back:  No CVA tenderness Lungs:  Clear with no wheezes HEART:  Regular rate rhythm, no murmurs, no rubs, no clicks Abd:  soft, positive bowel sounds, no organomegally, no rebound, no guarding Ext:  2 plus pulses, no edema, no cyanosis, no clubbing Skin:  No rashes no nodules Neuro:  CN II through XII intact, motor grossly intact  EKG - nsr with biv pacing  DEVICE  Normal device function.  See PaceArt for details.   Assess/Plan: 1. CHB - he is asymptomatic, s/p PPM insertion. 2. PPM - His [employer] Biv PPM is working normally. 3. Chronic systolic heart failure - his symptoms are class 2. No change in meds.  [place] Spotted.

## 2020-01-22 ENCOUNTER — Other Ambulatory Visit: Payer: Self-pay | Admitting: Internal Medicine

## 2020-01-22 ENCOUNTER — Ambulatory Visit (INDEPENDENT_AMBULATORY_CARE_PROVIDER_SITE_OTHER): Payer: Medicare Other | Admitting: Internal Medicine

## 2020-01-22 ENCOUNTER — Encounter: Payer: Self-pay | Admitting: Internal Medicine

## 2020-01-22 ENCOUNTER — Other Ambulatory Visit: Payer: Self-pay

## 2020-01-22 VITALS — BP 120/70 | HR 62 | Temp 97.8°F | Ht 71.5 in | Wt 171.0 lb

## 2020-01-22 DIAGNOSIS — I255 Ischemic cardiomyopathy: Secondary | ICD-10-CM | POA: Diagnosis not present

## 2020-01-22 DIAGNOSIS — C61 Malignant neoplasm of prostate: Secondary | ICD-10-CM | POA: Diagnosis not present

## 2020-01-22 DIAGNOSIS — I10 Essential (primary) hypertension: Secondary | ICD-10-CM | POA: Diagnosis not present

## 2020-01-22 DIAGNOSIS — E538 Deficiency of other specified B group vitamins: Secondary | ICD-10-CM | POA: Diagnosis not present

## 2020-01-22 DIAGNOSIS — D509 Iron deficiency anemia, unspecified: Secondary | ICD-10-CM

## 2020-01-22 DIAGNOSIS — E785 Hyperlipidemia, unspecified: Secondary | ICD-10-CM | POA: Diagnosis not present

## 2020-01-22 DIAGNOSIS — R739 Hyperglycemia, unspecified: Secondary | ICD-10-CM | POA: Diagnosis not present

## 2020-01-22 LAB — CBC WITH DIFFERENTIAL/PLATELET
Basophils Absolute: 0 10*3/uL (ref 0.0–0.1)
Basophils Relative: 0.7 % (ref 0.0–3.0)
Eosinophils Absolute: 0.3 10*3/uL (ref 0.0–0.7)
Eosinophils Relative: 7 % — ABNORMAL HIGH (ref 0.0–5.0)
HCT: 26.1 % — ABNORMAL LOW (ref 39.0–52.0)
Hemoglobin: 9.2 g/dL — ABNORMAL LOW (ref 13.0–17.0)
Lymphocytes Relative: 18.4 % (ref 12.0–46.0)
Lymphs Abs: 0.8 10*3/uL (ref 0.7–4.0)
MCHC: 35.1 g/dL (ref 30.0–36.0)
MCV: 96.2 fl (ref 78.0–100.0)
Monocytes Absolute: 0.4 10*3/uL (ref 0.1–1.0)
Monocytes Relative: 10 % (ref 3.0–12.0)
Neutro Abs: 2.8 10*3/uL (ref 1.4–7.7)
Neutrophils Relative %: 63.9 % (ref 43.0–77.0)
Platelets: 214 10*3/uL (ref 150.0–400.0)
RBC: 2.72 Mil/uL — ABNORMAL LOW (ref 4.22–5.81)
RDW: 13.3 % (ref 11.5–15.5)
WBC: 4.3 10*3/uL (ref 4.0–10.5)

## 2020-01-22 LAB — URINALYSIS, ROUTINE W REFLEX MICROSCOPIC
Hgb urine dipstick: NEGATIVE
Ketones, ur: NEGATIVE
Leukocytes,Ua: NEGATIVE
Nitrite: NEGATIVE
RBC / HPF: NONE SEEN (ref 0–?)
Specific Gravity, Urine: >=1.03 — AB (ref 1.000–1.030)
Total Protein, Urine: NEGATIVE
Urine Glucose: NEGATIVE
Urobilinogen, UA: 0.2 (ref 0.0–1.0)
pH: 5 (ref 5.0–8.0)

## 2020-01-22 LAB — LIPID PANEL
Cholesterol: 158 mg/dL (ref 0–200)
HDL: 43.1 mg/dL (ref >39.00)
LDL Cholesterol: 92 mg/dL (ref 0–99)
NonHDL: 115.38
Total CHOL/HDL Ratio: 4
Triglycerides: 119 mg/dL (ref 0.0–149.0)
VLDL: 23.8 mg/dL (ref 0.0–40.0)

## 2020-01-22 LAB — CUP PACEART INCLINIC DEVICE CHECK
Battery Remaining Longevity: 87 mo
Battery Voltage: 2.99 V
Brady Statistic RA Percent Paced: 78 %
Brady Statistic RV Percent Paced: 99.43 %
Date Time Interrogation Session: 20210616161300
Implantable Lead Implant Date: 20050509
Implantable Lead Implant Date: 20050509
Implantable Lead Implant Date: 20200116
Implantable Lead Location: 753858
Implantable Lead Location: 753859
Implantable Lead Location: 753860
Implantable Lead Model: 1580
Implantable Pulse Generator Implant Date: 20200116
Lead Channel Impedance Value: 275 Ohm
Lead Channel Impedance Value: 387.5 Ohm
Lead Channel Impedance Value: 750 Ohm
Lead Channel Pacing Threshold Amplitude: 0.75 V
Lead Channel Pacing Threshold Amplitude: 0.875 V
Lead Channel Pacing Threshold Amplitude: 1.5 V
Lead Channel Pacing Threshold Pulse Width: 0.5 ms
Lead Channel Pacing Threshold Pulse Width: 0.5 ms
Lead Channel Pacing Threshold Pulse Width: 1 ms
Lead Channel Sensing Intrinsic Amplitude: 1.8 mV
Lead Channel Setting Pacing Amplitude: 1.5 V
Lead Channel Setting Pacing Amplitude: 1.75 V
Lead Channel Setting Pacing Amplitude: 2 V
Lead Channel Setting Pacing Pulse Width: 0.5 ms
Lead Channel Setting Pacing Pulse Width: 1 ms
Lead Channel Setting Sensing Sensitivity: 4 mV
Pulse Gen Model: 3562
Pulse Gen Serial Number: 9097773

## 2020-01-22 LAB — HEPATIC FUNCTION PANEL
ALT: 9 U/L (ref 0–53)
AST: 18 U/L (ref 0–37)
Albumin: 4.5 g/dL (ref 3.5–5.2)
Alkaline Phosphatase: 64 U/L (ref 39–117)
Bilirubin, Direct: 0.2 mg/dL (ref 0.0–0.3)
Total Bilirubin: 0.7 mg/dL (ref 0.2–1.2)
Total Protein: 6.5 g/dL (ref 6.0–8.3)

## 2020-01-22 LAB — BASIC METABOLIC PANEL
BUN: 32 mg/dL — ABNORMAL HIGH (ref 6–23)
CO2: 26 mEq/L (ref 19–32)
Calcium: 9.8 mg/dL (ref 8.4–10.5)
Chloride: 109 mEq/L (ref 96–112)
Creatinine, Ser: 1.7 mg/dL — ABNORMAL HIGH (ref 0.40–1.50)
GFR: 38.44 mL/min — ABNORMAL LOW (ref >60.00)
Glucose, Bld: 85 mg/dL (ref 70–99)
Potassium: 4.7 mEq/L (ref 3.5–5.1)
Sodium: 141 mEq/L (ref 135–145)

## 2020-01-22 LAB — FERRITIN: Ferritin: 115.2 ng/mL (ref 22.0–322.0)

## 2020-01-22 LAB — HEMOGLOBIN A1C: Hgb A1c MFr Bld: 5.5 % (ref 4.6–6.5)

## 2020-01-22 LAB — IBC PANEL
Iron: 107 ug/dL (ref 42–165)
Saturation Ratios: 28.7 % (ref 20.0–50.0)
Transferrin: 266 mg/dL (ref 212.0–360.0)

## 2020-01-22 LAB — VITAMIN B12: Vitamin B-12: 159 pg/mL — ABNORMAL LOW (ref 211–911)

## 2020-01-22 LAB — TSH: TSH: 1.61 u[IU]/mL (ref 0.35–4.50)

## 2020-01-22 LAB — PSA: PSA: 0 ng/mL — ABNORMAL LOW (ref 0.10–4.00)

## 2020-01-22 MED ORDER — VITAMIN B-12 1000 MCG PO TABS: 1000.0000 ug | ORAL_TABLET | Freq: Every day | ORAL | 3 refills | Status: DC

## 2020-01-22 NOTE — Progress Notes (Signed)
Subjective:    Patient ID: MALE [name], male    DOB: 1932-03-19, 86 y.o.   MRN: [id number]  HPI  Here to f/u; overall doing ok,  Pt denies chest pain, increasing sob or doe, wheezing, orthopnea, PND, increased LE swelling, palpitations, dizziness or syncope.  Pt denies new neurological symptoms such as new headache, or facial or extremity weakness or numbness.  Pt denies polydipsia, polyuria, or low sugar episode.  Pt states overall good compliance with meds, mostly trying to follow appropriate diet, with wt overall stable,  but little exercise however.  No new compliants Wt Readings from Last 3 Encounters:  01/22/20 171 lb (77.6 kg)  01/20/20 170 lb (77.1 kg)  07/24/19 165 lb (74.8 kg)   Past Medical History:  Diagnosis Date  . AICD (automatic cardioverter/defibrillator) present    "turned it off today, put in an extra lead,  and made it just a pacemaker" (08/21/2018)  . Anemia, unspecified   . Aortic stenosis    Mild, echo, October, 2011  . Arthritis    "joints; arms"  (08/21/2018)  . AV block, complete (HCC)    Permanent pacemaker  . CAD (coronary artery disease)    DES to LAD 2005 /  DES to LAD 2008  . Cardiomyopathy    Ischemic, EF 30%, 2005  . Chronic bronchitis ([place])   . DJD (degenerative joint disease)   . Dyslipidemia   . Ejection fraction < 50%    EF 30%, echo, 2005  /  EF 30%, catheterization, 2008  . GERD (gastroesophageal reflux disease)   . Hyperlipidemia   . Hypertension   . Hypothyroidism   . ICD (implantable cardiac defibrillator), single, in situ    for syncope and arrhythmia 2005 / generator change June, 2011  . Iron deficiency anemia 06/17/2018  . LBBB (left bundle branch block)   . Mitral regurgitation    Mild, echo, October, 2011  . Myocardial infarction ([place]) 1989  . Presence of permanent cardiac pacemaker   . Prostate cancer ([place])   . Rectal fissure   . Syncope    2005, ICD placed  . Systolic CHF, chronic ([place])   . Vitamin B12 deficiency      Past Surgical History:  Procedure Laterality Date  . BIV UPGRADE  08/21/2018  . BIV UPGRADE N/A 08/21/2018   Procedure: BIV UPGRADE;  Surgeon: Evans Lance, MD;  Location: [facility];  Service: Cardiovascular;  Laterality: N/A;  . CARDIAC CATHETERIZATION  2008  . ICD IMPLANT  2005  . INSERTION PROSTATE RADIATION SEED    . LAPAROSCOPIC CHOLECYSTECTOMY  04/2005   Dr. Lucia Gaskins  . PROSTATE BIOPSY    . RECTAL DILATATION     "stretched it twice" (08/21/2018)  . SHOULDER OPEN ROTATOR CUFF REPAIR Right     reports that he quit smoking about 67 years ago. His smoking use included cigarettes. He has a 2.00 pack-year smoking history. He has never used smokeless tobacco. He reports that he does not drink alcohol and does not use drugs. family history includes Breast cancer in his maternal aunt; Cancer (age of onset: 20) in his mother; Heart Problems in his brother and father; Heart attack in his brother; Heart attack (age of onset: 24) in his father; Lung cancer in his brother and sister; Other in his sister; Stroke in his brother and mother. Allergies  Allergen Reactions  . Pantoprazole Other (See Comments)    B12 levels dropped on medication   Current Outpatient  Medications on File Prior to Visit  Medication Sig Dispense Refill  . acetaminophen (TYLENOL) 650 MG CR tablet Take 1,300 mg by mouth 2 (two) times daily.    Marland Kitchen aspirin 81 MG tablet Take 81 mg by mouth daily.      Marland Kitchen CALCIUM PO Take 2 tablets by mouth daily.     . Cholecalciferol (VITAMIN D) 50 MCG (2000 UT) CAPS Take 2,000 Units by mouth daily.    . clopidogrel (PLAVIX) 75 MG tablet TAKE 1 TABLET BY MOUTH EVERY DAY 90 tablet 1  . fish oil-omega-3 fatty acids 1000 MG capsule Take 1 g by mouth 2 (two) times daily.     Marland Kitchen gemfibrozil (LOPID) 600 MG tablet Take 1 tablet (600 mg total) by mouth daily. 90 tablet 3  . levothyroxine (SYNTHROID) 50 MCG tablet Take 1 tablet (50 mcg total) by mouth daily before breakfast. 90 tablet 3  .  metoprolol succinate (TOPROL-XL) 50 MG 24 hr tablet Take 1 tablet (50 mg total) by mouth daily. Take with or immediately following a meal. Must keep scheduled appt for future refills 90 tablet 0  . Multiple Vitamins-Minerals (MENS MULTIVITAMIN PLUS PO) Take 1 tablet by mouth daily.      Marland Kitchen omeprazole (PRILOSEC) 20 MG capsule Take 1 capsule (20 mg total) by mouth daily. 90 capsule 3  . polyethylene glycol (MIRALAX / GLYCOLAX) packet Take 17 g by mouth See admin instructions. Take 17g mixed with liquid once daily, may do a second dose as needed for constipation    . ramipril (ALTACE) 10 MG capsule Take 1 capsule (10 mg total) by mouth daily. 90 capsule 3  . simvastatin (ZOCOR) 40 MG tablet Take 1 tablet (40 mg total) by mouth at bedtime. 90 tablet 3   No current facility-administered medications on file prior to visit.   Review of Systems All otherwise neg per pt    Objective:   Physical Exam BP 120/70 (BP Location: Left Arm, Patient Position: Sitting, Cuff Size: Large)   Pulse 62   Temp 97.8 F (36.6 C) (Oral)   Ht 5' 11.5" (1.816 m)   Wt 171 lb (77.6 kg)   SpO2 95%   BMI 23.52 kg/m  VS noted,  Constitutional: Pt appears in NAD HENT: Head: NCAT.  Right Ear: External ear normal.  Left Ear: External ear normal.  Eyes: . Pupils are equal, round, and reactive to light. Conjunctivae and EOM are normal Nose: without d/c or deformity Neck: Neck supple. Gross normal ROM Cardiovascular: Normal rate and regular rhythm.   Pulmonary/Chest: Effort normal and breath sounds without rales or wheezing.  Abd:  Soft, NT, ND, + BS, no organomegaly Neurological: Pt is alert. At baseline orientation, motor grossly intact Skin: Skin is warm. No rashes, other new lesions, no LE edema Psychiatric: Pt behavior is normal without agitation  All otherwise neg per pt Lab Results  Component Value Date   WBC 4.3 01/22/2020   HGB 9.2 (L) 01/22/2020   HCT 26.1 Repeated and verified X2. (L) 01/22/2020   PLT  214.0 01/22/2020   GLUCOSE 85 01/22/2020   CHOL 158 01/22/2020   TRIG 119.0 01/22/2020   HDL 43.10 01/22/2020   LDLDIRECT 96.3 02/26/2011   LDLCALC 92 01/22/2020   ALT 9 01/22/2020   AST 18 01/22/2020   NA 141 01/22/2020   K 4.7 01/22/2020   CL 109 01/22/2020   CREATININE 1.70 (H) 01/22/2020   BUN 32 (H) 01/22/2020   CO2 26 01/22/2020   TSH  1.61 01/22/2020   PSA 0.00 Repeated and verified X2. (L) 01/22/2020   INR 0.95 01/06/2010   HGBA1C 5.5 01/22/2020      Assessment & Plan:

## 2020-01-22 NOTE — Patient Instructions (Signed)

## 2020-01-23 ENCOUNTER — Encounter: Payer: Self-pay | Admitting: Internal Medicine

## 2020-01-23 NOTE — Assessment & Plan Note (Signed)
For psa, f/u urology

## 2020-01-23 NOTE — Assessment & Plan Note (Signed)
No overt bleeding, for f/u cbc and iron studies

## 2020-01-23 NOTE — Assessment & Plan Note (Signed)
Cont oral replacement, f/u lab 

## 2020-01-23 NOTE — Assessment & Plan Note (Addendum)
stable overall by history and exam, recent data reviewed with pt, and pt to continue medical treatment as before,  to f/u any worsening symptoms or concerns  I spent 31 minutes in preparing to see the patient by review of recent labs, imaging and procedures, obtaining and reviewing separately obtained history, communicating with the patient and family or caregiver, ordering medications, tests or procedures, and documenting clinical information in the EHR including the differential Dx, treatment, and any further evaluation and other management of prostate ca, htn, hld, iron def anemia, hyperglycemia, b12 deficiency

## 2020-01-23 NOTE — Assessment & Plan Note (Signed)
stable overall by history and exam, recent data reviewed with pt, and pt to continue medical treatment as before,  to f/u any worsening symptoms or concerns  

## 2020-01-28 ENCOUNTER — Telehealth: Payer: Self-pay

## 2020-01-28 NOTE — Telephone Encounter (Signed)
New message    Daughter Adela Lank asking what type of B12 medication he needs to take   The B12 shot was discontinued now his B12 is low.   Asking for a callback

## 2020-01-29 NOTE — Telephone Encounter (Signed)
Pt dtr contacted and inform okay to start B12.

## 2020-01-29 NOTE — Telephone Encounter (Signed)
Ok for monthly b12

## 2020-01-29 NOTE — Telephone Encounter (Signed)
Spoke to pt dtr [name]) - she would like pt to have the injections instead. She stated that pt was previously tested and it showed that he did not absorb the B12 when taken orally.   Please advise if pt can be set up for B12 injections.

## 2020-02-03 ENCOUNTER — Other Ambulatory Visit: Payer: Self-pay

## 2020-02-03 ENCOUNTER — Ambulatory Visit (INDEPENDENT_AMBULATORY_CARE_PROVIDER_SITE_OTHER): Payer: Medicare Other | Admitting: *Deleted

## 2020-02-03 DIAGNOSIS — E538 Deficiency of other specified B group vitamins: Secondary | ICD-10-CM | POA: Diagnosis not present

## 2020-02-03 MED ORDER — CYANOCOBALAMIN 1000 MCG/ML IJ SOLN
1000.0000 ug | Freq: Once | INTRAMUSCULAR | Status: AC
  Administered 2020-02-03: 1000 ug via INTRAMUSCULAR

## 2020-02-03 NOTE — Progress Notes (Signed)
Pls cosign for B12 inj../lmb  

## 2020-03-04 ENCOUNTER — Other Ambulatory Visit: Payer: Self-pay

## 2020-03-04 ENCOUNTER — Ambulatory Visit (INDEPENDENT_AMBULATORY_CARE_PROVIDER_SITE_OTHER): Payer: Medicare Other | Admitting: *Deleted

## 2020-03-04 DIAGNOSIS — E538 Deficiency of other specified B group vitamins: Secondary | ICD-10-CM | POA: Diagnosis not present

## 2020-03-04 MED ORDER — CYANOCOBALAMIN 1000 MCG/ML IJ SOLN
1000.0000 ug | Freq: Once | INTRAMUSCULAR | Status: AC
  Administered 2020-03-04: 1000 ug via INTRAMUSCULAR

## 2020-03-04 NOTE — Progress Notes (Signed)
Pls cosign for B12 inj../lmb  

## 2020-04-04 ENCOUNTER — Ambulatory Visit (INDEPENDENT_AMBULATORY_CARE_PROVIDER_SITE_OTHER): Payer: Medicare Other

## 2020-04-04 ENCOUNTER — Other Ambulatory Visit: Payer: Self-pay

## 2020-04-04 DIAGNOSIS — E538 Deficiency of other specified B group vitamins: Secondary | ICD-10-CM

## 2020-04-04 MED ORDER — CYANOCOBALAMIN 1000 MCG/ML IJ SOLN
1000.0000 ug | Freq: Once | INTRAMUSCULAR | Status: AC
  Administered 2020-04-04: 1000 ug via INTRAMUSCULAR

## 2020-04-04 NOTE — Progress Notes (Signed)
Pt here for monthly B12 injection per Dr. Jenny Reichmann  B12 1064mcg given IM, and pt tolerated injection well.  Next B12 injection scheduled for 05/05/2020.  Please sign off in MD's absence

## 2020-04-10 ENCOUNTER — Other Ambulatory Visit: Payer: Self-pay | Admitting: Internal Medicine

## 2020-04-10 NOTE — Telephone Encounter (Signed)
Please refill as per office routine med refill policy (all routine meds refilled for 3 mo or monthly per pt preference up to one year from last visit, then month to month grace period for 3 mo, then further med refills will have to be denied)  

## 2020-04-14 ENCOUNTER — Ambulatory Visit (INDEPENDENT_AMBULATORY_CARE_PROVIDER_SITE_OTHER): Payer: Medicare Other | Admitting: *Deleted

## 2020-04-14 DIAGNOSIS — I5022 Chronic systolic (congestive) heart failure: Secondary | ICD-10-CM

## 2020-04-15 LAB — CUP PACEART REMOTE DEVICE CHECK
Battery Remaining Longevity: 91 mo
Battery Remaining Percentage: 95.5 %
Battery Voltage: 2.98 V
Brady Statistic AP VP Percent: 85 %
Brady Statistic AP VS Percent: 1 %
Brady Statistic AS VP Percent: 14 %
Brady Statistic AS VS Percent: 1 %
Brady Statistic RA Percent Paced: 85 %
Date Time Interrogation Session: 20210909020026
Implantable Lead Implant Date: 20050509
Implantable Lead Implant Date: 20050509
Implantable Lead Implant Date: 20200116
Implantable Lead Location: 753858
Implantable Lead Location: 753859
Implantable Lead Location: 753860
Implantable Lead Model: 1580
Implantable Pulse Generator Implant Date: 20200116
Lead Channel Impedance Value: 280 Ohm
Lead Channel Impedance Value: 380 Ohm
Lead Channel Impedance Value: 730 Ohm
Lead Channel Pacing Threshold Amplitude: 0.75 V
Lead Channel Pacing Threshold Amplitude: 0.75 V
Lead Channel Pacing Threshold Amplitude: 0.875 V
Lead Channel Pacing Threshold Pulse Width: 0.5 ms
Lead Channel Pacing Threshold Pulse Width: 0.5 ms
Lead Channel Pacing Threshold Pulse Width: 1 ms
Lead Channel Sensing Intrinsic Amplitude: 1 mV
Lead Channel Sensing Intrinsic Amplitude: 11.7 mV
Lead Channel Setting Pacing Amplitude: 1.5 V
Lead Channel Setting Pacing Amplitude: 1.75 V
Lead Channel Setting Pacing Amplitude: 2 V
Lead Channel Setting Pacing Pulse Width: 0.5 ms
Lead Channel Setting Pacing Pulse Width: 1 ms
Lead Channel Setting Sensing Sensitivity: 4 mV
Pulse Gen Model: 3562
Pulse Gen Serial Number: 9097773

## 2020-04-15 NOTE — Progress Notes (Signed)
Remote pacemaker transmission.   

## 2020-05-05 ENCOUNTER — Ambulatory Visit (INDEPENDENT_AMBULATORY_CARE_PROVIDER_SITE_OTHER): Payer: Medicare Other

## 2020-05-05 ENCOUNTER — Encounter: Payer: Self-pay | Admitting: Internal Medicine

## 2020-05-05 ENCOUNTER — Other Ambulatory Visit: Payer: Self-pay

## 2020-05-05 DIAGNOSIS — E538 Deficiency of other specified B group vitamins: Secondary | ICD-10-CM | POA: Diagnosis not present

## 2020-05-05 DIAGNOSIS — Z23 Encounter for immunization: Secondary | ICD-10-CM | POA: Diagnosis not present

## 2020-05-05 MED ORDER — CYANOCOBALAMIN 1000 MCG/ML IJ SOLN
1000.0000 ug | Freq: Once | INTRAMUSCULAR | Status: AC
  Administered 2020-05-05: 1000 ug via INTRAMUSCULAR

## 2020-05-05 NOTE — Progress Notes (Signed)
Patient ID: [name], male   DOB: October 12, 1931, 86 y.o.   MRN: [id number] Medical screening examination/treatment/procedure(s) were performed by non-physician practitioner and as supervising physician I was immediately available for consultation/collaboration. I agree with above. Cathlean Cower, MD

## 2020-05-05 NOTE — Progress Notes (Signed)
Patient seen in office today and given B12 per Dr. Gwynn Burly orders/cjs

## 2020-05-09 DIAGNOSIS — Z8546 Personal history of malignant neoplasm of prostate: Secondary | ICD-10-CM | POA: Diagnosis not present

## 2020-05-09 DIAGNOSIS — N485 Ulcer of penis: Secondary | ICD-10-CM | POA: Diagnosis not present

## 2020-06-06 ENCOUNTER — Other Ambulatory Visit: Payer: Self-pay

## 2020-06-06 ENCOUNTER — Ambulatory Visit (INDEPENDENT_AMBULATORY_CARE_PROVIDER_SITE_OTHER): Payer: Medicare Other

## 2020-06-06 DIAGNOSIS — E538 Deficiency of other specified B group vitamins: Secondary | ICD-10-CM

## 2020-06-06 MED ORDER — CYANOCOBALAMIN 1000 MCG/ML IJ SOLN
1000.0000 ug | INTRAMUSCULAR | Status: AC
  Administered 2020-06-06 – 2021-02-08 (×7): 1000 ug via INTRAMUSCULAR

## 2020-06-06 NOTE — Progress Notes (Signed)
Pt here for monthly B12 injection per Dr Jenny Reichmann.  B12 1022mcg given IM right deltoid and pt tolerated injection well.

## 2020-06-07 DIAGNOSIS — Z8546 Personal history of malignant neoplasm of prostate: Secondary | ICD-10-CM | POA: Diagnosis not present

## 2020-06-14 DIAGNOSIS — Z8546 Personal history of malignant neoplasm of prostate: Secondary | ICD-10-CM | POA: Diagnosis not present

## 2020-06-14 DIAGNOSIS — N481 Balanitis: Secondary | ICD-10-CM | POA: Diagnosis not present

## 2020-06-23 DIAGNOSIS — Z23 Encounter for immunization: Secondary | ICD-10-CM | POA: Diagnosis not present

## 2020-07-05 ENCOUNTER — Other Ambulatory Visit: Payer: Self-pay | Admitting: Internal Medicine

## 2020-07-05 NOTE — Telephone Encounter (Signed)
Please refill as per office routine med refill policy (all routine meds refilled for 3 mo or monthly per pt preference up to one year from last visit, then month to month grace period for 3 mo, then further med refills will have to be denied)  

## 2020-07-06 ENCOUNTER — Other Ambulatory Visit: Payer: Self-pay

## 2020-07-06 ENCOUNTER — Ambulatory Visit (INDEPENDENT_AMBULATORY_CARE_PROVIDER_SITE_OTHER): Payer: Medicare Other

## 2020-07-06 DIAGNOSIS — E538 Deficiency of other specified B group vitamins: Secondary | ICD-10-CM | POA: Diagnosis not present

## 2020-07-06 NOTE — Progress Notes (Signed)
Pt here for monthly B12 injection per Dr Jenny Reichmann.  B12 1030mcg given IM right deltoid and pt tolerated injection well.  Next B12 injection scheduled for 08/08/20.

## 2020-07-14 ENCOUNTER — Ambulatory Visit (INDEPENDENT_AMBULATORY_CARE_PROVIDER_SITE_OTHER): Payer: Medicare Other

## 2020-07-14 DIAGNOSIS — I255 Ischemic cardiomyopathy: Secondary | ICD-10-CM | POA: Diagnosis not present

## 2020-07-15 LAB — CUP PACEART REMOTE DEVICE CHECK
Battery Remaining Longevity: 90 mo
Battery Remaining Percentage: 95.5 %
Battery Voltage: 2.98 V
Brady Statistic AP VP Percent: 82 %
Brady Statistic AP VS Percent: 1 %
Brady Statistic AS VP Percent: 18 %
Brady Statistic AS VS Percent: 1 %
Brady Statistic RA Percent Paced: 82 %
Date Time Interrogation Session: 20211209020529
Implantable Lead Implant Date: 20050509
Implantable Lead Implant Date: 20050509
Implantable Lead Implant Date: 20200116
Implantable Lead Location: 753858
Implantable Lead Location: 753859
Implantable Lead Location: 753860
Implantable Lead Model: 1580
Implantable Pulse Generator Implant Date: 20200116
Lead Channel Impedance Value: 260 Ohm
Lead Channel Impedance Value: 360 Ohm
Lead Channel Impedance Value: 730 Ohm
Lead Channel Pacing Threshold Amplitude: 0.75 V
Lead Channel Pacing Threshold Amplitude: 0.75 V
Lead Channel Pacing Threshold Amplitude: 0.875 V
Lead Channel Pacing Threshold Pulse Width: 0.5 ms
Lead Channel Pacing Threshold Pulse Width: 0.5 ms
Lead Channel Pacing Threshold Pulse Width: 1 ms
Lead Channel Sensing Intrinsic Amplitude: 1.3 mV
Lead Channel Sensing Intrinsic Amplitude: 11.7 mV
Lead Channel Setting Pacing Amplitude: 1.5 V
Lead Channel Setting Pacing Amplitude: 1.75 V
Lead Channel Setting Pacing Amplitude: 2 V
Lead Channel Setting Pacing Pulse Width: 0.5 ms
Lead Channel Setting Pacing Pulse Width: 1 ms
Lead Channel Setting Sensing Sensitivity: 4 mV
Pulse Gen Model: 3562
Pulse Gen Serial Number: 9097773

## 2020-07-18 ENCOUNTER — Encounter: Payer: Self-pay | Admitting: Cardiovascular Disease

## 2020-07-18 ENCOUNTER — Other Ambulatory Visit: Payer: Self-pay

## 2020-07-18 ENCOUNTER — Ambulatory Visit (INDEPENDENT_AMBULATORY_CARE_PROVIDER_SITE_OTHER): Payer: Medicare Other | Admitting: Cardiovascular Disease

## 2020-07-18 VITALS — BP 158/80 | HR 62 | Ht 71.5 in | Wt 171.2 lb

## 2020-07-18 DIAGNOSIS — I5022 Chronic systolic (congestive) heart failure: Secondary | ICD-10-CM | POA: Diagnosis not present

## 2020-07-18 DIAGNOSIS — I351 Nonrheumatic aortic (valve) insufficiency: Secondary | ICD-10-CM

## 2020-07-18 DIAGNOSIS — I255 Ischemic cardiomyopathy: Secondary | ICD-10-CM | POA: Diagnosis not present

## 2020-07-18 DIAGNOSIS — I25118 Atherosclerotic heart disease of native coronary artery with other forms of angina pectoris: Secondary | ICD-10-CM

## 2020-07-18 DIAGNOSIS — I442 Atrioventricular block, complete: Secondary | ICD-10-CM

## 2020-07-18 DIAGNOSIS — I10 Essential (primary) hypertension: Secondary | ICD-10-CM

## 2020-07-18 NOTE — Patient Instructions (Signed)

## 2020-07-18 NOTE — Progress Notes (Signed)
Chief Complaint  Patient presents with  . Follow-up    CAD    History of Present Illness: 86 yo male with history of complete heart block, CAD s/p stenting of the LAD in 2005 and 2008, ischemic cardiomyopathy s/p ICD ( implanted 2005/generator change 2011), HLD, HTN and chronic systolic CHF here today for cardiac follow up. He had been followed by Dr. Ron Parker prior to his retirement. His ICD is followed by Dr. [name]. Echo July 2016 with LVEF=40-45%, mild AI, trivial MR. Diagnosis of prostate cancer in 2014. I saw him in October 2019 and he c/o jaw pain and tooth pain with dyspnea with moderate exertion. Nuclear stress test June 18, 2018 with medium sized inferior, inferoseptal and apical defect with scar and possible soft tissue attenuation but no ischemia. LVEF noted to be 35% with hypokinesis of the inferior, inferoseptal and apical walls. Echo September 2020 with LVEF=50-55%, mild MR.   He is here today for follow up. The patient denies any chest pain, dyspnea, palpitations, lower extremity edema, orthopnea, PND, dizziness, near syncope or syncope. No changes in chronic jaw pain with moderate exertion.    Primary Care Physician: Biagio Borg, MD  Past Medical History:  Diagnosis Date  . AICD (automatic cardioverter/defibrillator) present    "turned it off today, put in an extra lead,  and made it just a pacemaker" (08/21/2018)  . Anemia, unspecified   . Aortic stenosis    Mild, echo, October, 2011  . Arthritis    "joints; arms"  (08/21/2018)  . AV block, complete (HCC)    Permanent pacemaker  . CAD (coronary artery disease)    DES to LAD 2005 /  DES to LAD 2008  . Cardiomyopathy    Ischemic, EF 30%, 2005  . Chronic bronchitis ([place])   . DJD (degenerative joint disease)   . Dyslipidemia   . Ejection fraction < 50%    EF 30%, echo, 2005  /  EF 30%, catheterization, 2008  . GERD (gastroesophageal reflux disease)   . Hyperlipidemia   . Hypertension   . Hypothyroidism   . ICD  (implantable cardiac defibrillator), single, in situ    for syncope and arrhythmia 2005 / generator change June, 2011  . Iron deficiency anemia 06/17/2018  . LBBB (left bundle branch block)   . Mitral regurgitation    Mild, echo, October, 2011  . Myocardial infarction ([place]) 1989  . Presence of permanent cardiac pacemaker   . Prostate cancer ([place])   . Rectal fissure   . Syncope    2005, ICD placed  . Systolic CHF, chronic ([place])   . Vitamin B12 deficiency     Past Surgical History:  Procedure Laterality Date  . BIV UPGRADE  08/21/2018  . BIV UPGRADE N/A 08/21/2018   Procedure: BIV UPGRADE;  Surgeon: Evans Lance, MD;  Location: [facility];  Service: Cardiovascular;  Laterality: N/A;  . CARDIAC CATHETERIZATION  2008  . ICD IMPLANT  2005  . INSERTION PROSTATE RADIATION SEED    . LAPAROSCOPIC CHOLECYSTECTOMY  04/2005   Dr. Lucia Gaskins  . PROSTATE BIOPSY    . RECTAL DILATATION     "stretched it twice" (08/21/2018)  . SHOULDER OPEN ROTATOR CUFF REPAIR Right     Current Outpatient Medications  Medication Sig Dispense Refill  . acetaminophen (TYLENOL) 650 MG CR tablet Take 1,300 mg by mouth 2 (two) times daily.    Marland Kitchen aspirin 81 MG tablet Take 81 mg by mouth daily.    Marland Kitchen  CALCIUM PO Take 2 tablets by mouth daily.     . Cholecalciferol (VITAMIN D) 50 MCG (2000 UT) CAPS Take 2,000 Units by mouth daily.    . clopidogrel (PLAVIX) 75 MG tablet TAKE 1 TABLET BY MOUTH EVERY DAY 90 tablet 1  . fish oil-omega-3 fatty acids 1000 MG capsule Take 1 g by mouth 2 (two) times daily.    Marland Kitchen gemfibrozil (LOPID) 600 MG tablet Take 1 tablet (600 mg total) by mouth daily. 90 tablet 3  . levothyroxine (SYNTHROID) 50 MCG tablet Take 1 tablet (50 mcg total) by mouth daily before breakfast. 90 tablet 3  . metoprolol succinate (TOPROL-XL) 50 MG 24 hr tablet TAKE 1 TABLET EVERY DAY 90 tablet 0  . Multiple Vitamins-Minerals (MENS MULTIVITAMIN PLUS PO) Take 1 tablet by mouth daily.    Marland Kitchen omeprazole (PRILOSEC) 20  MG capsule Take 1 capsule (20 mg total) by mouth daily. 90 capsule 3  . polyethylene glycol (MIRALAX / GLYCOLAX) packet Take 17 g by mouth See admin instructions. Take 17g mixed with liquid once daily, may do a second dose as needed for constipation    . ramipril (ALTACE) 10 MG capsule Take 1 capsule (10 mg total) by mouth daily. 90 capsule 3  . simvastatin (ZOCOR) 40 MG tablet Take 1 tablet (40 mg total) by mouth at bedtime. 90 tablet 3  . vitamin B-12 (CYANOCOBALAMIN) 1000 MCG tablet Take 1 tablet (1,000 mcg total) by mouth daily. 90 tablet 3   Current Facility-Administered Medications  Medication Dose Route Frequency Provider Last Rate Last Admin  . cyanocobalamin ((VITAMIN B-12)) injection 1,000 mcg  1,000 mcg Intramuscular Q30 days Biagio Borg, MD   1,000 mcg at 07/06/20 1353    Allergies  Allergen Reactions  . Pantoprazole Other (See Comments)    B12 levels dropped on medication    Social History   Socioeconomic History  . Marital status: Widowed    Spouse name: Not on file  . Number of children: 2  . Years of education: Not on file  . Highest education level: Not on file  Occupational History  . Occupation: repairs looms at [employer]  Tobacco Use  . Smoking status: Former Smoker    Packs/day: 1.00    Years: 2.00    Pack years: 2.00    Types: Cigarettes    Quit date: 08/06/1952    Years since quitting: 67.9  . Smokeless tobacco: Never Used  Vaping Use  . Vaping Use: Never used  Substance and Sexual Activity  . Alcohol use: Never  . Drug use: Never  . Sexual activity: Not Currently  Other Topics Concern  . Not on file  Social History Narrative  . Not on file   Social Determinants of Health   Financial Resource Strain: Not on file  Food Insecurity: Not on file  Transportation Needs: Not on file  Physical Activity: Not on file  Stress: Not on file  Social Connections: Not on file  Intimate Partner Violence: Not on file    Family History  Problem Relation  Age of Onset  . Stroke Mother   . Cancer Mother 53       unknown  . Heart attack Father 88  . Heart Problems Father   . Heart attack Brother   . Lung cancer Brother   . Stroke Brother   . Heart Problems Brother        CABG  . Lung cancer Sister   . Other Sister  PACEMAKER  . Breast cancer Maternal Aunt     Review of Systems:  As stated in the HPI and otherwise negative.   BP (!) 158/80   Pulse 62   Ht 5' 11.5" (1.816 m)   Wt 171 lb 3.2 oz (77.7 kg)   SpO2 99%   BMI 23.54 kg/m   Physical Examination:  General: Well developed, well nourished, NAD  HEENT: OP clear, mucus membranes moist  SKIN: warm, dry. No rashes. Neuro: No focal deficits  Musculoskeletal: Muscle strength 5/5 all ext  Psychiatric: Mood and affect normal  Neck: No JVD, no carotid bruits, no thyromegaly, no lymphadenopathy.  Lungs:Clear bilaterally, no wheezes, rhonci, crackles Cardiovascular: Regular rate and rhythm. No murmurs, gallops or rubs. Abdomen:Soft. Bowel sounds present. Non-tender.  Extremities: No lower extremity edema. Pulses are 2 + in the bilateral DP/PT.  Echo September 2020: 1. Left ventricular ejection fraction, by visual estimation, is 50 to  55%. The left ventricle has normal function. There is no left ventricular  hypertrophy.  2. Left ventricular diastolic Doppler parameters are indeterminate  pattern of LV diastolic filling.  3. Global longitudinal strain= =17%.  4. Global right ventricle has normal systolic function.The right  ventricular size is normal. No increase in right ventricular wall  thickness.  5. Mild mitral valve regurgitation.  6. The tricuspid valve is normal in structure. Tricuspid valve  regurgitation is trivial.  7. The aortic valve is tricuspid Aortic valve regurgitation is trivial by  color flow Doppler. Mild to moderate aortic valve sclerosis/calcification  without any evidence of aortic stenosis.   EKG:  EKG is not  ordered today. The  ekg ordered today demonstrates   Recent Labs: 01/22/2020: ALT 9; BUN 32; Creatinine, Ser 1.70; Hemoglobin 9.2; Platelets 214.0; Potassium 4.7; Sodium 141; TSH 1.61   Lipid Panel    Component Value Date/Time   CHOL 158 01/22/2020 0918   TRIG 119.0 01/22/2020 0918   HDL 43.10 01/22/2020 0918   CHOLHDL 4 01/22/2020 0918   VLDL 23.8 01/22/2020 0918   LDLCALC 92 01/22/2020 0918   LDLDIRECT 96.3 02/26/2011 0735     Wt Readings from Last 3 Encounters:  07/18/20 171 lb 3.2 oz (77.7 kg)  01/22/20 171 lb (77.6 kg)  01/20/20 170 lb (77.1 kg)     Other studies Reviewed: Additional studies/ records that were reviewed today include: . Review of the above records demonstrates:   Assessment and Plan:   1. CAD with angina: He has no change in chronic jaw pain with exertion. Nuclear stress test in 2019 with no ischemia. Continue ASA, Plavix, beta blocker and statin.      2. Ischemic cardiomyopathy/Chronic systolic CHF: Echo September 2020 with LVEF=50-55%. No volume overload on exam. ICD is in place. Continue beta blocker and Ace-inh.     3. HTN: BP controlled at home. Continue current therapy  4. HLD: His lipids are followed in primary care. Continue statin  5. Complete heart block: ICD in place. Followed in EP/Pacer clinic.   6. Aortic valve disease: Trivial AI by echo September 2020.   Current medicines are reviewed at length with the patient today.  The patient does not have concerns regarding medicines.  The following changes have been made:  no change  Labs/ tests ordered today include:   No orders of the defined types were placed in this encounter.   Disposition:   F/U with me in 12 months  Signed, Lauree Chandler, MD 07/18/2020 10:50 AM    [facility] [address], [place], [place]  [address] Phone: [phone]; Fax: [phone]

## 2020-07-27 NOTE — Progress Notes (Signed)
Remote pacemaker transmission.   

## 2020-08-08 ENCOUNTER — Other Ambulatory Visit: Payer: Self-pay

## 2020-08-08 ENCOUNTER — Ambulatory Visit (INDEPENDENT_AMBULATORY_CARE_PROVIDER_SITE_OTHER): Payer: Medicare Other

## 2020-08-08 DIAGNOSIS — E538 Deficiency of other specified B group vitamins: Secondary | ICD-10-CM | POA: Diagnosis not present

## 2020-08-08 NOTE — Progress Notes (Signed)
Pt here for monthly B12 injection per  Dr John.  B12 1000mcg given IM left deltoid and pt tolerated injection well.  Pt to schedule next B12 injection upon check out.  

## 2020-09-06 ENCOUNTER — Other Ambulatory Visit: Payer: Self-pay

## 2020-09-06 MED ORDER — LEVOTHYROXINE SODIUM 50 MCG PO TABS: 50.0000 ug | ORAL_TABLET | Freq: Every day | ORAL | 3 refills | Status: DC

## 2020-09-07 ENCOUNTER — Other Ambulatory Visit: Payer: Self-pay

## 2020-09-08 ENCOUNTER — Ambulatory Visit (INDEPENDENT_AMBULATORY_CARE_PROVIDER_SITE_OTHER): Payer: Medicare Other

## 2020-09-08 DIAGNOSIS — E538 Deficiency of other specified B group vitamins: Secondary | ICD-10-CM | POA: Diagnosis not present

## 2020-09-08 NOTE — Progress Notes (Signed)
Pt here for monthly B12 injection per Dr Jenny Reichmann.  B12 1095mcg given IM left deltoid and pt tolerated injection well.  Next B12 injection scheduled for 10/06/20.

## 2020-09-12 ENCOUNTER — Other Ambulatory Visit: Payer: Self-pay

## 2020-10-02 ENCOUNTER — Other Ambulatory Visit: Payer: Self-pay | Admitting: Internal Medicine

## 2020-10-02 NOTE — Telephone Encounter (Signed)
Please refill as per office routine med refill policy (all routine meds refilled for 3 mo or monthly per pt preference up to one year from last visit, then month to month grace period for 3 mo, then further med refills will have to be denied)  

## 2020-10-06 ENCOUNTER — Other Ambulatory Visit: Payer: Self-pay

## 2020-10-06 ENCOUNTER — Ambulatory Visit (INDEPENDENT_AMBULATORY_CARE_PROVIDER_SITE_OTHER): Payer: Medicare Other

## 2020-10-06 DIAGNOSIS — E538 Deficiency of other specified B group vitamins: Secondary | ICD-10-CM

## 2020-10-06 MED ORDER — CYANOCOBALAMIN 1000 MCG/ML IJ SOLN
1000.0000 ug | Freq: Once | INTRAMUSCULAR | Status: AC
  Administered 2020-10-06: 1000 ug via INTRAMUSCULAR

## 2020-10-06 NOTE — Progress Notes (Signed)
Patient ID: [name], male   DOB: 10/19/1931, 88 y.o.   MRN: [id number]  Medical screening examination/treatment/procedure(s) were performed by non-physician practitioner and as supervising physician I was immediately available for consultation/collaboration.  I agree with above. James John, MD  

## 2020-10-06 NOTE — Progress Notes (Signed)
Patient seen in office today for B12 injection with orders given under Dr. Cathlean Cower. Patient tolerated injection well.

## 2020-10-13 ENCOUNTER — Ambulatory Visit (INDEPENDENT_AMBULATORY_CARE_PROVIDER_SITE_OTHER): Payer: Medicare Other

## 2020-10-13 DIAGNOSIS — I5022 Chronic systolic (congestive) heart failure: Secondary | ICD-10-CM | POA: Diagnosis not present

## 2020-10-13 DIAGNOSIS — I255 Ischemic cardiomyopathy: Secondary | ICD-10-CM

## 2020-10-17 LAB — CUP PACEART REMOTE DEVICE CHECK
Battery Remaining Longevity: 90 mo
Battery Remaining Percentage: 95.5 %
Battery Voltage: 2.98 V
Brady Statistic AP VP Percent: 84 %
Brady Statistic AP VS Percent: 1 %
Brady Statistic AS VP Percent: 16 %
Brady Statistic AS VS Percent: 1 %
Brady Statistic RA Percent Paced: 84 %
Date Time Interrogation Session: 20220310020011
Implantable Lead Implant Date: 20050509
Implantable Lead Implant Date: 20050509
Implantable Lead Implant Date: 20200116
Implantable Lead Location: 753858
Implantable Lead Location: 753859
Implantable Lead Location: 753860
Implantable Lead Model: 1580
Implantable Pulse Generator Implant Date: 20200116
Lead Channel Impedance Value: 280 Ohm
Lead Channel Impedance Value: 360 Ohm
Lead Channel Impedance Value: 700 Ohm
Lead Channel Pacing Threshold Amplitude: 0.625 V
Lead Channel Pacing Threshold Amplitude: 0.75 V
Lead Channel Pacing Threshold Amplitude: 1 V
Lead Channel Pacing Threshold Pulse Width: 0.5 ms
Lead Channel Pacing Threshold Pulse Width: 0.5 ms
Lead Channel Pacing Threshold Pulse Width: 1 ms
Lead Channel Sensing Intrinsic Amplitude: 1.3 mV
Lead Channel Sensing Intrinsic Amplitude: 6.4 mV
Lead Channel Setting Pacing Amplitude: 1.5 V
Lead Channel Setting Pacing Amplitude: 1.625
Lead Channel Setting Pacing Amplitude: 2 V
Lead Channel Setting Pacing Pulse Width: 0.5 ms
Lead Channel Setting Pacing Pulse Width: 1 ms
Lead Channel Setting Sensing Sensitivity: 4 mV
Pulse Gen Model: 3562
Pulse Gen Serial Number: 9097773

## 2020-10-21 NOTE — Progress Notes (Signed)
Remote pacemaker transmission.   

## 2020-11-09 ENCOUNTER — Other Ambulatory Visit: Payer: Self-pay

## 2020-11-09 ENCOUNTER — Ambulatory Visit (INDEPENDENT_AMBULATORY_CARE_PROVIDER_SITE_OTHER): Payer: Medicare Other

## 2020-11-09 DIAGNOSIS — E538 Deficiency of other specified B group vitamins: Secondary | ICD-10-CM | POA: Diagnosis not present

## 2020-11-09 NOTE — Progress Notes (Signed)
Pt here for monthly B12 injection per Dr. Jenny Reichmann  B12 1058mcg given left deltoid IM, and pt tolerated injection well.  Next B12 injection scheduled for 12/08/20.

## 2020-11-14 ENCOUNTER — Other Ambulatory Visit: Payer: Self-pay | Admitting: Internal Medicine

## 2020-11-14 NOTE — Telephone Encounter (Signed)
Please refill as per office routine med refill policy (all routine meds refilled for 3 mo or monthly per pt preference up to one year from last visit, then month to month grace period for 3 mo, then further med refills will have to be denied)  

## 2020-11-15 ENCOUNTER — Other Ambulatory Visit: Payer: Self-pay

## 2020-11-15 DIAGNOSIS — I2510 Atherosclerotic heart disease of native coronary artery without angina pectoris: Secondary | ICD-10-CM

## 2020-11-15 MED ORDER — OMEPRAZOLE 20 MG PO CPDR: 20.0000 mg | DELAYED_RELEASE_CAPSULE | Freq: Every day | ORAL | 0 refills | Status: DC

## 2020-11-15 MED ORDER — RAMIPRIL 10 MG PO CAPS: 10.0000 mg | ORAL_CAPSULE | Freq: Every day | ORAL | 0 refills | Status: DC

## 2020-11-15 MED ORDER — CLOPIDOGREL BISULFATE 75 MG PO TABS: 1.0000 | ORAL_TABLET | Freq: Every day | ORAL | 0 refills | Status: DC

## 2020-12-05 DIAGNOSIS — Z8546 Personal history of malignant neoplasm of prostate: Secondary | ICD-10-CM | POA: Diagnosis not present

## 2020-12-08 ENCOUNTER — Ambulatory Visit (INDEPENDENT_AMBULATORY_CARE_PROVIDER_SITE_OTHER): Payer: Medicare Other

## 2020-12-08 ENCOUNTER — Other Ambulatory Visit: Payer: Self-pay

## 2020-12-08 DIAGNOSIS — E538 Deficiency of other specified B group vitamins: Secondary | ICD-10-CM

## 2020-12-08 NOTE — Progress Notes (Signed)
Pt here for monthly B12 injection per Dr. Jenny Reichmann  B12 1014mcg given left IM, and pt tolerated injection well.  Next B12 injection scheduled for 01/09/21

## 2020-12-12 DIAGNOSIS — Z8546 Personal history of malignant neoplasm of prostate: Secondary | ICD-10-CM | POA: Diagnosis not present

## 2020-12-12 DIAGNOSIS — N481 Balanitis: Secondary | ICD-10-CM | POA: Diagnosis not present

## 2021-01-04 ENCOUNTER — Other Ambulatory Visit: Payer: Self-pay | Admitting: Internal Medicine

## 2021-01-04 NOTE — Telephone Encounter (Signed)
Please refill as per office routine med refill policy (all routine meds refilled for 3 mo or monthly per pt preference up to one year from last visit, then month to month grace period for 3 mo, then further med refills will have to be denied)  

## 2021-01-06 ENCOUNTER — Other Ambulatory Visit: Payer: Self-pay | Admitting: Internal Medicine

## 2021-01-06 NOTE — Telephone Encounter (Signed)
Please refill as per office routine med refill policy (all routine meds refilled for 3 mo or monthly per pt preference up to one year from last visit, then month to month grace period for 3 mo, then further med refills will have to be denied)  

## 2021-01-09 ENCOUNTER — Ambulatory Visit (INDEPENDENT_AMBULATORY_CARE_PROVIDER_SITE_OTHER): Payer: Medicare Other

## 2021-01-09 ENCOUNTER — Other Ambulatory Visit: Payer: Self-pay

## 2021-01-09 DIAGNOSIS — E538 Deficiency of other specified B group vitamins: Secondary | ICD-10-CM

## 2021-01-09 MED ORDER — CYANOCOBALAMIN 1000 MCG/ML IJ SOLN
1000.0000 ug | Freq: Once | INTRAMUSCULAR | Status: AC
  Administered 2021-01-09: 1000 ug via INTRAMUSCULAR

## 2021-01-09 NOTE — Progress Notes (Addendum)
Patient in office for b12 injection per Dr. Jenny Reichmann.  b12 injection administered in left deltoid and patient tolerated injection well.  Next appt scheduled for 02/08/21

## 2021-01-12 ENCOUNTER — Ambulatory Visit (INDEPENDENT_AMBULATORY_CARE_PROVIDER_SITE_OTHER): Payer: Medicare Other

## 2021-01-12 DIAGNOSIS — I442 Atrioventricular block, complete: Secondary | ICD-10-CM | POA: Diagnosis not present

## 2021-01-12 LAB — CUP PACEART REMOTE DEVICE CHECK
Battery Remaining Longevity: 91 mo
Battery Remaining Percentage: 95.5 %
Battery Voltage: 2.98 V
Brady Statistic AP VP Percent: 85 %
Brady Statistic AP VS Percent: 1 %
Brady Statistic AS VP Percent: 15 %
Brady Statistic AS VS Percent: 1 %
Brady Statistic RA Percent Paced: 85 %
Date Time Interrogation Session: 20220609020011
Implantable Lead Implant Date: 20050509
Implantable Lead Implant Date: 20050509
Implantable Lead Implant Date: 20200116
Implantable Lead Location: 753858
Implantable Lead Location: 753859
Implantable Lead Location: 753860
Implantable Lead Model: 1580
Implantable Pulse Generator Implant Date: 20200116
Lead Channel Impedance Value: 260 Ohm
Lead Channel Impedance Value: 390 Ohm
Lead Channel Impedance Value: 660 Ohm
Lead Channel Pacing Threshold Amplitude: 0.625 V
Lead Channel Pacing Threshold Amplitude: 0.75 V
Lead Channel Pacing Threshold Amplitude: 0.875 V
Lead Channel Pacing Threshold Pulse Width: 0.5 ms
Lead Channel Pacing Threshold Pulse Width: 0.5 ms
Lead Channel Pacing Threshold Pulse Width: 1 ms
Lead Channel Sensing Intrinsic Amplitude: 1.5 mV
Lead Channel Sensing Intrinsic Amplitude: 6.4 mV
Lead Channel Setting Pacing Amplitude: 1.5 V
Lead Channel Setting Pacing Amplitude: 1.625
Lead Channel Setting Pacing Amplitude: 2 V
Lead Channel Setting Pacing Pulse Width: 0.5 ms
Lead Channel Setting Pacing Pulse Width: 1 ms
Lead Channel Setting Sensing Sensitivity: 4 mV
Pulse Gen Model: 3562
Pulse Gen Serial Number: 9097773

## 2021-01-24 ENCOUNTER — Other Ambulatory Visit: Payer: Self-pay

## 2021-01-24 ENCOUNTER — Encounter: Payer: Self-pay | Admitting: Internal Medicine

## 2021-01-24 ENCOUNTER — Telehealth: Payer: Self-pay | Admitting: *Deleted

## 2021-01-24 ENCOUNTER — Ambulatory Visit (INDEPENDENT_AMBULATORY_CARE_PROVIDER_SITE_OTHER): Payer: Medicare Other | Admitting: Internal Medicine

## 2021-01-24 VITALS — BP 122/76 | HR 50 | Temp 97.6°F | Ht 71.5 in | Wt 169.8 lb

## 2021-01-24 DIAGNOSIS — D649 Anemia, unspecified: Secondary | ICD-10-CM | POA: Diagnosis not present

## 2021-01-24 DIAGNOSIS — E538 Deficiency of other specified B group vitamins: Secondary | ICD-10-CM | POA: Diagnosis not present

## 2021-01-24 DIAGNOSIS — E559 Vitamin D deficiency, unspecified: Secondary | ICD-10-CM | POA: Diagnosis not present

## 2021-01-24 DIAGNOSIS — I255 Ischemic cardiomyopathy: Secondary | ICD-10-CM | POA: Diagnosis not present

## 2021-01-24 DIAGNOSIS — E785 Hyperlipidemia, unspecified: Secondary | ICD-10-CM

## 2021-01-24 DIAGNOSIS — R739 Hyperglycemia, unspecified: Secondary | ICD-10-CM | POA: Diagnosis not present

## 2021-01-24 DIAGNOSIS — C61 Malignant neoplasm of prostate: Secondary | ICD-10-CM | POA: Diagnosis not present

## 2021-01-24 DIAGNOSIS — I10 Essential (primary) hypertension: Secondary | ICD-10-CM | POA: Diagnosis not present

## 2021-01-24 LAB — CBC WITH DIFFERENTIAL/PLATELET
Basophils Absolute: 0 10*3/uL (ref 0.0–0.1)
Basophils Relative: 0.8 % (ref 0.0–3.0)
Eosinophils Absolute: 0.3 10*3/uL (ref 0.0–0.7)
Eosinophils Relative: 7.4 % — ABNORMAL HIGH (ref 0.0–5.0)
HCT: 26.4 % — ABNORMAL LOW (ref 39.0–52.0)
Hemoglobin: 9.5 g/dL — ABNORMAL LOW (ref 13.0–17.0)
Lymphocytes Relative: 20 % (ref 12.0–46.0)
Lymphs Abs: 0.9 10*3/uL (ref 0.7–4.0)
MCHC: 35.8 g/dL (ref 30.0–36.0)
MCV: 95 fl (ref 78.0–100.0)
Monocytes Absolute: 0.4 10*3/uL (ref 0.1–1.0)
Monocytes Relative: 10.3 % (ref 3.0–12.0)
Neutro Abs: 2.6 10*3/uL (ref 1.4–7.7)
Neutrophils Relative %: 61.5 % (ref 43.0–77.0)
Platelets: 196 10*3/uL (ref 150.0–400.0)
RBC: 2.78 Mil/uL — ABNORMAL LOW (ref 4.22–5.81)
RDW: 13 % (ref 11.5–15.5)
WBC: 4.3 10*3/uL (ref 4.0–10.5)

## 2021-01-24 LAB — BASIC METABOLIC PANEL
BUN: 27 mg/dL — ABNORMAL HIGH (ref 6–23)
CO2: 25 mEq/L (ref 19–32)
Calcium: 10 mg/dL (ref 8.4–10.5)
Chloride: 106 mEq/L (ref 96–112)
Creatinine, Ser: 1.87 mg/dL — ABNORMAL HIGH (ref 0.40–1.50)
GFR: 32.14 mL/min — ABNORMAL LOW (ref >60.00)
Glucose, Bld: 99 mg/dL (ref 70–99)
Potassium: 5 mEq/L (ref 3.5–5.1)
Sodium: 139 mEq/L (ref 135–145)

## 2021-01-24 LAB — URINALYSIS, ROUTINE W REFLEX MICROSCOPIC
Bilirubin Urine: NEGATIVE
Hgb urine dipstick: NEGATIVE
Ketones, ur: NEGATIVE
Leukocytes,Ua: NEGATIVE
Nitrite: NEGATIVE
RBC / HPF: NONE SEEN (ref 0–?)
Specific Gravity, Urine: 1.015 (ref 1.000–1.030)
Total Protein, Urine: NEGATIVE
Urine Glucose: NEGATIVE
Urobilinogen, UA: 0.2 (ref 0.0–1.0)
pH: 6 (ref 5.0–8.0)

## 2021-01-24 LAB — PSA: PSA: 0 ng/mL — ABNORMAL LOW (ref 0.10–4.00)

## 2021-01-24 LAB — VITAMIN D 25 HYDROXY (VIT D DEFICIENCY, FRACTURES): VITD: 105.23 ng/mL (ref 30.00–100.00)

## 2021-01-24 LAB — LIPID PANEL
Cholesterol: 174 mg/dL (ref 0–200)
HDL: 40.2 mg/dL (ref >39.00)
LDL Cholesterol: 100 mg/dL — ABNORMAL HIGH (ref 0–99)
NonHDL: 133.35
Total CHOL/HDL Ratio: 4
Triglycerides: 165 mg/dL — ABNORMAL HIGH (ref 0.0–149.0)
VLDL: 33 mg/dL (ref 0.0–40.0)

## 2021-01-24 LAB — FERRITIN: Ferritin: 107.1 ng/mL (ref 22.0–322.0)

## 2021-01-24 LAB — HEPATIC FUNCTION PANEL
ALT: 11 U/L (ref 0–53)
AST: 23 U/L (ref 0–37)
Albumin: 4.6 g/dL (ref 3.5–5.2)
Alkaline Phosphatase: 53 U/L (ref 39–117)
Bilirubin, Direct: 0.2 mg/dL (ref 0.0–0.3)
Total Bilirubin: 1.2 mg/dL (ref 0.2–1.2)
Total Protein: 6.5 g/dL (ref 6.0–8.3)

## 2021-01-24 LAB — IBC PANEL
Iron: 120 ug/dL (ref 42–165)
Saturation Ratios: 34.1 % (ref 20.0–50.0)
Transferrin: 251 mg/dL (ref 212.0–360.0)

## 2021-01-24 LAB — VITAMIN B12: Vitamin B-12: 382 pg/mL (ref 211–911)

## 2021-01-24 LAB — TSH: TSH: 2.48 u[IU]/mL (ref 0.35–4.50)

## 2021-01-24 LAB — HEMOGLOBIN A1C: Hgb A1c MFr Bld: 5.7 % (ref 4.6–6.5)

## 2021-01-24 MED ORDER — METOPROLOL SUCCINATE ER 50 MG PO TB24: 50.0000 mg | ORAL_TABLET | Freq: Every day | ORAL | 3 refills | Status: DC

## 2021-01-24 NOTE — Assessment & Plan Note (Signed)
BP Readings from Last 3 Encounters:  01/24/21 122/76  07/18/20 (!) 158/80  01/22/20 120/70   Stable, pt to continue medical treatment toprol - refill sent today

## 2021-01-24 NOTE — Addendum Note (Signed)
Addended by: Boris Lown B on: 01/24/2021 08:46 AM   Modules accepted: Orders

## 2021-01-24 NOTE — Assessment & Plan Note (Signed)
conts to f/u with urology, for f/u psa

## 2021-01-24 NOTE — Telephone Encounter (Signed)
Critical Vitamin D @ 105.23

## 2021-01-24 NOTE — Progress Notes (Signed)
Patient ID: [name], male   DOB: 07-02-1932, 87 y.o.   MRN: [id number]         Chief Complaint:: yearly exam       HPI:  [name] is a 87 y.o. male overall doing very well, no overt bleeding though has several bruising to the right arm with working on the rose bushes which he doesn't even like; conts to take B12 IM as po does not work for him in the past.  Trying to follow lower chol diet, though not at goal he is wary of any med changes today as he seems to be doing overall well.  Pt denies chest pain, increased sob or doe, wheezing, orthopnea, PND, increased LE swelling, palpitations, dizziness or syncope.   Pt denies polydipsia, polyuria, or new focal neuro s/s.   Pt denies fever, wt loss, night sweats, loss of appetite, or other constitutional symptoms     Wt Readings from Last 3 Encounters:  01/24/21 169 lb 12.8 oz (77 kg)  07/18/20 171 lb 3.2 oz (77.7 kg)  01/22/20 171 lb (77.6 kg)   BP Readings from Last 3 Encounters:  01/24/21 122/76  07/18/20 (!) 158/80  01/22/20 120/70   Immunization History  Administered Date(s) Administered   Fluad Quad(high Dose 65+) 04/30/2019, 05/05/2020   H1N1 08/04/2008   Influenza Split 05/10/2012, 05/11/2013   Influenza Whole 05/04/2008, 06/21/2009, 05/08/2010   Influenza, High Dose Seasonal PF 05/01/2017, 05/05/2018   Influenza,inj,Quad PF,6+ Mos 04/09/2014, 05/07/2016   Influenza,inj,quad, With Preservative 06/06/2017   Influenza-Unspecified 05/07/2015   Moderna Sars-Covid-2 Vaccination 08/07/2019, 09/07/2019, 09/19/2020   Pneumococcal Conjugate-13 09/09/2014   Pneumococcal Polysaccharide-23 05/17/2000, 08/30/2009   Tdap 01/30/2019   There are no preventive care reminders to display for this patient.     Past Medical History:  Diagnosis Date   AICD (automatic cardioverter/defibrillator) present    "turned it off today, put in an extra lead,  and made it just a pacemaker" (08/21/2018)   Anemia, unspecified    Aortic  stenosis    Mild, echo, October, 2011   Arthritis    "joints; arms"  (08/21/2018)   AV block, complete ([place])    Permanent pacemaker   CAD (coronary artery disease)    DES to LAD 2005 /  DES to LAD 2008   Cardiomyopathy    Ischemic, EF 30%, 2005   Chronic bronchitis (HCC)    DJD (degenerative joint disease)    Dyslipidemia    Ejection fraction < 50%    EF 30%, echo, 2005  /  EF 30%, catheterization, 2008   GERD (gastroesophageal reflux disease)    Hyperlipidemia    Hypertension    Hypothyroidism    ICD (implantable cardiac defibrillator), single, in situ    for syncope and arrhythmia 2005 / generator change June, 2011   Iron deficiency anemia 06/17/2018   LBBB (left bundle branch block)    Mitral regurgitation    Mild, echo, October, 2011   Myocardial infarction [facility]) 1989   Presence of permanent cardiac pacemaker    Prostate cancer [facility])    Rectal fissure    Syncope    2005, ICD placed   Systolic CHF, chronic ([place])    Vitamin B12 deficiency    Past Surgical History:  Procedure Laterality Date   BIV UPGRADE  08/21/2018   BIV UPGRADE N/A 08/21/2018   Procedure: BIV UPGRADE;  Surgeon: Evans Lance, MD;  Location: [place] [facility];  Service: Cardiovascular;  Laterality: N/A;  CARDIAC CATHETERIZATION  2008   ICD IMPLANT  2005   INSERTION PROSTATE RADIATION SEED     LAPAROSCOPIC CHOLECYSTECTOMY  04/2005   Dr. Lucia Gaskins   PROSTATE BIOPSY     RECTAL DILATATION     "stretched it twice" (08/21/2018)   SHOULDER OPEN ROTATOR CUFF REPAIR Right     reports that he quit smoking about 68 years ago. His smoking use included cigarettes. He has a 2.00 pack-year smoking history. He has never used smokeless tobacco. He reports that he does not drink alcohol and does not use drugs. family history includes Breast cancer in his maternal aunt; Cancer (age of onset: 33) in his mother; Heart Problems in his brother and father; Heart attack in his brother; Heart attack (age of onset: 31) in his  father; Lung cancer in his brother and sister; Other in his sister; Stroke in his brother and mother. Allergies  Allergen Reactions   Pantoprazole Other (See Comments)    B12 levels dropped on medication   Current Outpatient Medications on File Prior to Visit  Medication Sig Dispense Refill   acetaminophen (TYLENOL) 650 MG CR tablet Take 1,300 mg by mouth 2 (two) times daily.     aspirin 81 MG tablet Take 81 mg by mouth daily.     CALCIUM PO Take 2 tablets by mouth daily.      Cholecalciferol (VITAMIN D) 50 MCG (2000 UT) CAPS Take 2,000 Units by mouth daily.     clopidogrel (PLAVIX) 75 MG tablet Take 1 tablet (75 mg total) by mouth daily. 90 tablet 0   fish oil-omega-3 fatty acids 1000 MG capsule Take 1 g by mouth 2 (two) times daily.     gemfibrozil (LOPID) 600 MG tablet TAKE 1 TABLET BY MOUTH DAILY. GENERIC EQUIVALENT FOR LOPID 30 tablet 0   levothyroxine (SYNTHROID) 50 MCG tablet Take 1 tablet (50 mcg total) by mouth daily before breakfast. 90 tablet 3   Multiple Vitamins-Minerals (MENS MULTIVITAMIN PLUS PO) Take 1 tablet by mouth daily.     omeprazole (PRILOSEC) 20 MG capsule Take 1 capsule (20 mg total) by mouth daily. 90 capsule 0   polyethylene glycol (MIRALAX / GLYCOLAX) packet Take 17 g by mouth See admin instructions. Take 17g mixed with liquid once daily, may do a second dose as needed for constipation     ramipril (ALTACE) 10 MG capsule Take 1 capsule (10 mg total) by mouth daily. 90 capsule 0   simvastatin (ZOCOR) 40 MG tablet TAKE 1 TABLET BY MOUTH AT BEDTIME 90 tablet 0   nystatin cream (MYCOSTATIN) Apply topically.     Current Facility-Administered Medications on File Prior to Visit  Medication Dose Route Frequency Provider Last Rate Last Admin   cyanocobalamin ((VITAMIN B-12)) injection 1,000 mcg  1,000 mcg Intramuscular Q30 days Biagio Borg, MD   1,000 mcg at 12/08/20 1413        ROS:  All others reviewed and negative.  Objective        PE:  BP 122/76 (BP Location:  Left Arm, Patient Position: Sitting, Cuff Size: Normal)   Pulse (!) 50   Temp 97.6 F (36.4 C) (Oral)   Ht 5' 11.5" (1.816 m)   Wt 169 lb 12.8 oz (77 kg)   SpO2 98%   BMI 23.35 kg/m                 Constitutional: Pt appears in NAD  HENT: Head: NCAT.                Right Ear: External ear normal.                 Left Ear: External ear normal.                Eyes: . Pupils are equal, round, and reactive to light. Conjunctivae and EOM are normal               Nose: without d/c or deformity               Neck: Neck supple. Gross normal ROM               Cardiovascular: Normal rate and regular rhythm.                 Pulmonary/Chest: Effort normal and breath sounds without rales or wheezing.                Abd:  Soft, NT, ND, + BS, no organomegaly               Neurological: Pt is alert. At baseline orientation, motor grossly intact               Skin: Skin is warm. No rashes, no other new lesions, LE edema - none               Psychiatric: Pt behavior is normal without agitation   Micro: none  Cardiac tracings I have personally interpreted today:  none  Pertinent Radiological findings (summarize): none   Lab Results  Component Value Date   WBC 4.3 01/22/2020   HGB 9.2 (L) 01/22/2020   HCT 26.1 Repeated and verified X2. (L) 01/22/2020   PLT 214.0 01/22/2020   GLUCOSE 85 01/22/2020   CHOL 158 01/22/2020   TRIG 119.0 01/22/2020   HDL 43.10 01/22/2020   LDLDIRECT 96.3 02/26/2011   LDLCALC 92 01/22/2020   ALT 9 01/22/2020   AST 18 01/22/2020   NA 141 01/22/2020   K 4.7 01/22/2020   CL 109 01/22/2020   CREATININE 1.70 (H) 01/22/2020   BUN 32 (H) 01/22/2020   CO2 26 01/22/2020   TSH 1.61 01/22/2020   PSA 0.00 Repeated and verified X2. (L) 01/22/2020   INR 0.95 01/06/2010   HGBA1C 5.5 01/22/2020   Assessment/Plan:  [name] is a 87 y.o. White or Caucasian [1] male with  has a past medical history of AICD (automatic cardioverter/defibrillator)  present, Anemia, unspecified, Aortic stenosis, Arthritis, AV block, complete ([place]), CAD (coronary artery disease), Cardiomyopathy, Chronic bronchitis ([place]), DJD (degenerative joint disease), Dyslipidemia, Ejection fraction < 50%, GERD (gastroesophageal reflux disease), Hyperlipidemia, Hypertension, Hypothyroidism, ICD (implantable cardiac defibrillator), single, in situ, Iron deficiency anemia (06/17/2018), LBBB (left bundle branch block), Mitral regurgitation, Myocardial infarction ([place]) (1989), Presence of permanent cardiac pacemaker, Prostate cancer ([place]), Rectal fissure, Syncope, Systolic CHF, chronic ([place]), and Vitamin B12 deficiency.  Malignant neoplasm of prostate ([place]) conts to f/u with urology, for f/u psa  Anemia Chronic stable in the 9's over last several yrs with normal iron, b12 - for f/u labs today  Dyslipidemia Lab Results  Component Value Date   LDLCALC 92 01/22/2020   Goal ldl < 70 so uncontrolled but, pt to continue current statin zocor and lopid started per Dr Lenna Gilford years ago as he is wary of any changes and side effects  Hyperglycemia Lab Results  Component Value Date  HGBA1C 5.5 01/22/2020   Stable, pt to continue current medical treatment  - diet   Hypertension BP Readings from Last 3 Encounters:  01/24/21 122/76  07/18/20 (!) 158/80  01/22/20 120/70   Stable, pt to continue medical treatment toprol - refill sent today   Vitamin B 12 deficiency Pt to continue IM monthly as po has not worked for him in the past  Followup: Return in about 1 year (around 01/24/2022).  Cathlean Cower, MD 01/24/2021 8:40 AM [facility]

## 2021-01-24 NOTE — Assessment & Plan Note (Signed)
Chronic stable in the 9's over last several yrs with normal iron, b12 - for f/u labs today

## 2021-01-24 NOTE — Assessment & Plan Note (Signed)
Pt to continue IM monthly as po has not worked for him in the past

## 2021-01-24 NOTE — Patient Instructions (Signed)

## 2021-01-24 NOTE — Assessment & Plan Note (Signed)
Lab Results  Component Value Date   LDLCALC 92 01/22/2020   Goal ldl < 70 so uncontrolled but, pt to continue current statin zocor and lopid started per Dr Lenna Gilford years ago as he is wary of any changes and side effects

## 2021-01-24 NOTE — Assessment & Plan Note (Signed)
Lab Results  Component Value Date   HGBA1C 5.5 01/22/2020   Stable, pt to continue current medical treatment  - diet

## 2021-01-25 ENCOUNTER — Ambulatory Visit (INDEPENDENT_AMBULATORY_CARE_PROVIDER_SITE_OTHER): Payer: Medicare Other | Admitting: Internal Medicine

## 2021-01-25 ENCOUNTER — Encounter: Payer: Self-pay | Admitting: Internal Medicine

## 2021-01-25 VITALS — BP 144/72 | HR 59 | Ht 71.5 in | Wt 169.8 lb

## 2021-01-25 DIAGNOSIS — I5022 Chronic systolic (congestive) heart failure: Secondary | ICD-10-CM

## 2021-01-25 DIAGNOSIS — Z950 Presence of cardiac pacemaker: Secondary | ICD-10-CM | POA: Diagnosis not present

## 2021-01-25 DIAGNOSIS — I442 Atrioventricular block, complete: Secondary | ICD-10-CM | POA: Diagnosis not present

## 2021-01-25 DIAGNOSIS — I255 Ischemic cardiomyopathy: Secondary | ICD-10-CM

## 2021-01-25 NOTE — Progress Notes (Signed)
HPI [name] returns today for followup. He is a pleasant 87 yo man with CHB, s/p PPM insertion with upgrade to a biv PPM. He has class 2 CHF. He has done well in the interim. His chest pain has resolved. He has class 2A CHF symptoms. 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 He has not passed out and has not had diaghragmatic stimulation.  Allergies  Allergen Reactions   Pantoprazole Other (See Comments)    B12 levels dropped on medication     Current Outpatient Medications  Medication Sig Dispense Refill   acetaminophen (TYLENOL) 650 MG CR tablet Take 1,300 mg by mouth 2 (two) times daily.     aspirin 81 MG tablet Take 81 mg by mouth daily.     CALCIUM PO Take 2 tablets by mouth daily.      Cholecalciferol (VITAMIN D) 50 MCG (2000 UT) CAPS Take 2,000 Units by mouth daily.     clopidogrel (PLAVIX) 75 MG tablet Take 1 tablet (75 mg total) by mouth daily. 90 tablet 0   fish oil-omega-3 fatty acids 1000 MG capsule Take 1 g by mouth 2 (two) times daily.     gemfibrozil (LOPID) 600 MG tablet TAKE 1 TABLET BY MOUTH DAILY. GENERIC EQUIVALENT FOR LOPID 30 tablet 0   levothyroxine (SYNTHROID) 50 MCG tablet Take 1 tablet (50 mcg total) by mouth daily before breakfast. 90 tablet 3   metoprolol succinate (TOPROL-XL) 50 MG 24 hr tablet Take 1 tablet (50 mg total) by mouth daily. Take with or immediately following a meal. 90 tablet 3   Multiple Vitamins-Minerals (MENS MULTIVITAMIN PLUS PO) Take 1 tablet by mouth daily.     nystatin cream (MYCOSTATIN) Apply topically.     omeprazole (PRILOSEC) 20 MG capsule Take 1 capsule (20 mg total) by mouth daily. 90 capsule 0   polyethylene glycol (MIRALAX / GLYCOLAX) packet Take 17 g by mouth See admin instructions. Take 17g mixed with liquid once daily, may do a second dose as needed for constipation     ramipril (ALTACE) 10 MG capsule Take 1 capsule (10 mg total) by mouth daily. 90 capsule 0   simvastatin (ZOCOR) 40 MG tablet TAKE 1 TABLET BY MOUTH AT BEDTIME 90  tablet 0   Current Facility-Administered Medications  Medication Dose Route Frequency Provider Last Rate Last Admin   cyanocobalamin ((VITAMIN B-12)) injection 1,000 mcg  1,000 mcg Intramuscular Q30 days Biagio Borg, MD   1,000 mcg at 12/08/20 1413     Past Medical History:  Diagnosis Date   AICD (automatic cardioverter/defibrillator) present    "turned it off today, put in an extra lead,  and made it just a pacemaker" (08/21/2018)   Anemia, unspecified    Aortic stenosis    Mild, echo, October, 2011   Arthritis    "joints; arms"  (08/21/2018)   AV block, complete ([place])    Permanent pacemaker   CAD (coronary artery disease)    DES to LAD 2005 /  DES to LAD 2008   Cardiomyopathy    Ischemic, EF 30%, 2005   Chronic bronchitis (HCC)    DJD (degenerative joint disease)    Dyslipidemia    Ejection fraction < 50%    EF 30%, echo, 2005  /  EF 30%, catheterization, 2008   GERD (gastroesophageal reflux disease)    Hyperlipidemia    Hypertension    Hypothyroidism    ICD (implantable cardiac defibrillator), single, in situ    for syncope  and arrhythmia 2005 / generator change June, 2011   Iron deficiency anemia 06/17/2018   LBBB (left bundle branch block)    Mitral regurgitation    Mild, echo, October, 2011   Myocardial infarction [facility]) 1989   Presence of permanent cardiac pacemaker    Prostate cancer [facility])    Rectal fissure    Syncope    2005, ICD placed   Systolic CHF, chronic (HCC)    Vitamin B12 deficiency     ROS:   All systems reviewed and negative except as noted in the HPI.   Past Surgical History:  Procedure Laterality Date   BIV UPGRADE  08/21/2018   BIV UPGRADE N/A 08/21/2018   Procedure: BIV UPGRADE;  Surgeon: Evans Lance, MD;  Location: [facility];  Service: Cardiovascular;  Laterality: N/A;   CARDIAC CATHETERIZATION  2008   ICD IMPLANT  2005   INSERTION PROSTATE RADIATION SEED     LAPAROSCOPIC CHOLECYSTECTOMY  04/2005   Dr. Lucia Gaskins   PROSTATE  BIOPSY     RECTAL DILATATION     "stretched it twice" (08/21/2018)   SHOULDER OPEN ROTATOR CUFF REPAIR Right      Family History  Problem Relation Age of Onset   Stroke Mother    Cancer Mother 4       unknown   Heart attack Father 66   Heart Problems Father    Heart attack Brother    Lung cancer Brother    Stroke Brother    Heart Problems Brother        CABG   Lung cancer Sister    Other Sister        PACEMAKER   Breast cancer Maternal Aunt      Social History   Socioeconomic History   Marital status: Widowed    Spouse name: Not on file   Number of children: 2   Years of education: Not on file   Highest education level: Not on file  Occupational History   Occupation: repairs looms at [employer]  Tobacco Use   Smoking status: Former    Packs/day: 1.00    Years: 2.00    Pack years: 2.00    Types: Cigarettes    Quit date: 08/06/1952    Years since quitting: 68.5   Smokeless tobacco: Never  Vaping Use   Vaping Use: Never used  Substance and Sexual Activity   Alcohol use: Never   Drug use: Never   Sexual activity: Not Currently  Other Topics Concern   Not on file  Social History Narrative   Not on file   Social Determinants of Health   Financial Resource Strain: Not on file  Food Insecurity: Not on file  Transportation Needs: Not on file  Physical Activity: Not on file  Stress: Not on file  Social Connections: Not on file  Intimate Partner Violence: Not on file     BP (!) 144/72   Pulse (!) 59   Ht 5' 11.5" (1.816 m)   Wt 169 lb 12.8 oz (77 kg)   SpO2 97%   BMI 23.35 kg/m   Physical Exam:  Well appearing NAD HEENT: Unremarkable Neck:  No JVD, no thyromegally Lymphatics:  No adenopathy Back:  No CVA tenderness Lungs:  Clear with no wheezes HEART:  Regular rate rhythm, no murmurs, no rubs, no clicks Abd:  soft, positive bowel sounds, no organomegally, no rebound, no guarding Ext:  2 plus pulses, no edema, no cyanosis, no clubbing Skin:  No  rashes no nodules Neuro:  CN II through XII intact, motor grossly intact  EKG - NSR with biv pacing  DEVICE  Normal device function.  See PaceArt for details.   Assess/Plan:   1. CHB - he is asymptomatic, s/p PPM insertion.  2. PPM - His [employer] Biv PPM is working normally. His threshold is up a bit and we increased his ouput to 1.75 at 1 ms.  3. Chronic systolic heart failure - his symptoms are class 2. No change in meds.  4. CAD - he denies anginal symptoms. He will continue his current meds.  Carleene Overlie Dajuana Palen,MD

## 2021-01-25 NOTE — Patient Instructions (Signed)
Medication Instructions:  Your physician recommends that you continue on your current medications as directed. Please refer to the Current Medication list given to you today.  Labwork: None ordered.  Testing/Procedures: None ordered.  Follow-Up: Your physician wants you to follow-up in: one year with [name], MD or one of the following Advanced Practice Providers on your designated Care Team:   Chanetta Marshall, NP [name], PA-C Legrand Como "Jonni Sanger" [place], [place]  Remote monitoring is used to monitor your Pacemaker from home. This monitoring reduces the number of office visits required to check your device to one time per year. It allows [place] to keep an eye on the functioning of your device to ensure it is working properly. You are scheduled for a device check from home on 04/13/2021. You may send your transmission at any time that day. If you have a wireless device, the transmission will be sent automatically. After your physician reviews your transmission, you will receive a postcard with your next transmission date.  Any Other Special Instructions Will Be Listed Below (If Applicable).  If you need a refill on your cardiac medications before your next appointment, please call your pharmacy.

## 2021-01-27 NOTE — Telephone Encounter (Signed)
Per chart MD has sent pt a lab letter concerning results. Closing encounter.Marland KitchenJohny Chess

## 2021-01-30 ENCOUNTER — Other Ambulatory Visit: Payer: Self-pay | Admitting: Internal Medicine

## 2021-02-03 NOTE — Progress Notes (Signed)
Remote pacemaker transmission.   

## 2021-02-07 ENCOUNTER — Encounter: Payer: Self-pay | Admitting: Internal Medicine

## 2021-02-07 ENCOUNTER — Telehealth: Payer: Self-pay | Admitting: Internal Medicine

## 2021-02-07 NOTE — Telephone Encounter (Signed)
   Patients daughter called and was wondering if a copy of the most recent labs could be mailed to the patient at the address on file

## 2021-02-07 NOTE — Telephone Encounter (Signed)
Labs have been mailed to patient and patient's daughter contacted

## 2021-02-08 ENCOUNTER — Ambulatory Visit (INDEPENDENT_AMBULATORY_CARE_PROVIDER_SITE_OTHER): Payer: Medicare Other

## 2021-02-08 ENCOUNTER — Other Ambulatory Visit: Payer: Self-pay

## 2021-02-08 DIAGNOSIS — E538 Deficiency of other specified B group vitamins: Secondary | ICD-10-CM

## 2021-02-08 NOTE — Progress Notes (Signed)
Pt here for monthly B12 injection per Dr Jenny Reichmann.  B12 102mcg given IM in left deltoid and pt tolerated injection well.  Next B12 injection scheduled for 03/16/21.

## 2021-02-14 ENCOUNTER — Other Ambulatory Visit: Payer: Self-pay

## 2021-02-14 DIAGNOSIS — I2510 Atherosclerotic heart disease of native coronary artery without angina pectoris: Secondary | ICD-10-CM

## 2021-02-14 MED ORDER — OMEPRAZOLE 20 MG PO CPDR: 20.0000 mg | DELAYED_RELEASE_CAPSULE | Freq: Every day | ORAL | 0 refills | Status: DC

## 2021-02-14 MED ORDER — CLOPIDOGREL BISULFATE 75 MG PO TABS: 75.0000 mg | ORAL_TABLET | Freq: Every day | ORAL | 1 refills | Status: DC

## 2021-02-14 MED ORDER — RAMIPRIL 10 MG PO CAPS: 10.0000 mg | ORAL_CAPSULE | Freq: Every day | ORAL | 3 refills | Status: DC

## 2021-02-14 MED ORDER — SIMVASTATIN 40 MG PO TABS: 40.0000 mg | ORAL_TABLET | Freq: Every day | ORAL | 3 refills | Status: DC

## 2021-02-14 NOTE — Addendum Note (Signed)
Addended by: Elza Rafter D on: 02/14/2021 02:42 PM   Modules accepted: Orders

## 2021-02-14 NOTE — Addendum Note (Signed)
Addended by: Elza Rafter D on: 02/14/2021 01:59 PM   Modules accepted: Orders

## 2021-02-16 DIAGNOSIS — Z23 Encounter for immunization: Secondary | ICD-10-CM | POA: Diagnosis not present

## 2021-03-15 ENCOUNTER — Telehealth: Payer: Self-pay | Admitting: Internal Medicine

## 2021-03-15 NOTE — Telephone Encounter (Signed)
Spoke with pt. about scheduling his [employer] Visit, pt. states that he would like to speak with his daughter Adela Lank before scheduling.  He will call me back at [phone] to schedule.  No hx of AWV eligible as of 08/06/09  Please schedule at anytime with [facility] if pt. Calls the office back.    23 Minutes appointment   Any questions, please call me at [phone]

## 2021-03-16 ENCOUNTER — Ambulatory Visit (INDEPENDENT_AMBULATORY_CARE_PROVIDER_SITE_OTHER): Payer: Medicare Other

## 2021-03-16 ENCOUNTER — Other Ambulatory Visit: Payer: Self-pay

## 2021-03-16 DIAGNOSIS — E538 Deficiency of other specified B group vitamins: Secondary | ICD-10-CM | POA: Diagnosis not present

## 2021-03-16 MED ORDER — CYANOCOBALAMIN 1000 MCG/ML IJ SOLN
1000.0000 ug | Freq: Once | INTRAMUSCULAR | Status: AC
  Administered 2021-03-16: 1000 ug via INTRAMUSCULAR

## 2021-03-16 NOTE — Progress Notes (Signed)
Pt given B12 injection w/o any complications. 

## 2021-03-21 ENCOUNTER — Telehealth: Payer: Self-pay | Admitting: Internal Medicine

## 2021-03-21 MED ORDER — GEMFIBROZIL 600 MG PO TABS: ORAL_TABLET | ORAL | 0 refills | Status: DC

## 2021-03-21 NOTE — Telephone Encounter (Signed)
1.Medication Requested: gemfibrozil (LOPID) 600 MG tablet  2. Pharmacy (Name, Street, [facility]): [facility] [place], [place] - [address] Phone:  [phone]  Fax:  [phone]     3. On Med List: Y  4. Last Visit with PCP: 01/24/21  5. Next visit date with PCP: 01/30/2022   Agent: Please be advised that RX refills may take up to 3 business days. We ask that you follow-up with your pharmacy.

## 2021-03-21 NOTE — Addendum Note (Signed)
Addended by: Terence Lux A on: 03/21/2021 03:53 PM   Modules accepted: Orders

## 2021-04-11 ENCOUNTER — Other Ambulatory Visit: Payer: Self-pay

## 2021-04-11 DIAGNOSIS — I2510 Atherosclerotic heart disease of native coronary artery without angina pectoris: Secondary | ICD-10-CM

## 2021-04-11 MED ORDER — GEMFIBROZIL 600 MG PO TABS: ORAL_TABLET | ORAL | 3 refills | Status: DC

## 2021-04-13 ENCOUNTER — Ambulatory Visit (INDEPENDENT_AMBULATORY_CARE_PROVIDER_SITE_OTHER): Payer: Medicare Other

## 2021-04-13 DIAGNOSIS — I255 Ischemic cardiomyopathy: Secondary | ICD-10-CM

## 2021-04-13 LAB — CUP PACEART REMOTE DEVICE CHECK
Battery Remaining Longevity: 58 mo
Battery Remaining Percentage: 66 %
Battery Voltage: 2.98 V
Brady Statistic AP VP Percent: 86 %
Brady Statistic AP VS Percent: 1 %
Brady Statistic AS VP Percent: 14 %
Brady Statistic AS VS Percent: 1 %
Brady Statistic RA Percent Paced: 86 %
Date Time Interrogation Session: 20220908020019
Implantable Lead Implant Date: 20050509
Implantable Lead Implant Date: 20050509
Implantable Lead Implant Date: 20200116
Implantable Lead Location: 753858
Implantable Lead Location: 753859
Implantable Lead Location: 753860
Implantable Lead Model: 1580
Implantable Pulse Generator Implant Date: 20200116
Lead Channel Impedance Value: 300 Ohm
Lead Channel Impedance Value: 390 Ohm
Lead Channel Impedance Value: 660 Ohm
Lead Channel Pacing Threshold Amplitude: 0.75 V
Lead Channel Pacing Threshold Amplitude: 1 V
Lead Channel Pacing Threshold Amplitude: 1.25 V
Lead Channel Pacing Threshold Pulse Width: 0.5 ms
Lead Channel Pacing Threshold Pulse Width: 0.5 ms
Lead Channel Pacing Threshold Pulse Width: 1 ms
Lead Channel Sensing Intrinsic Amplitude: 1.5 mV
Lead Channel Sensing Intrinsic Amplitude: 6.4 mV
Lead Channel Setting Pacing Amplitude: 1.75 V
Lead Channel Setting Pacing Amplitude: 1.75 V
Lead Channel Setting Pacing Amplitude: 2 V
Lead Channel Setting Pacing Pulse Width: 0.5 ms
Lead Channel Setting Pacing Pulse Width: 1 ms
Lead Channel Setting Sensing Sensitivity: 4 mV
Pulse Gen Model: 3562
Pulse Gen Serial Number: 9097773

## 2021-04-17 ENCOUNTER — Ambulatory Visit (INDEPENDENT_AMBULATORY_CARE_PROVIDER_SITE_OTHER): Payer: Medicare Other

## 2021-04-17 ENCOUNTER — Other Ambulatory Visit: Payer: Self-pay

## 2021-04-17 DIAGNOSIS — E538 Deficiency of other specified B group vitamins: Secondary | ICD-10-CM

## 2021-04-17 MED ORDER — CYANOCOBALAMIN 1000 MCG/ML IJ SOLN
1000.0000 ug | Freq: Once | INTRAMUSCULAR | Status: AC
  Administered 2021-04-17: 1000 ug via INTRAMUSCULAR

## 2021-04-20 NOTE — Progress Notes (Signed)
Medical screening examination/treatment/procedure(s) were performed by non-physician practitioner and as supervising physician I was immediately available for consultation/collaboration. I agree with above. Steele Ledonne, MD   

## 2021-04-21 NOTE — Progress Notes (Signed)
Remote pacemaker transmission.   

## 2021-05-03 ENCOUNTER — Other Ambulatory Visit: Payer: Self-pay | Admitting: Internal Medicine

## 2021-05-03 DIAGNOSIS — I2510 Atherosclerotic heart disease of native coronary artery without angina pectoris: Secondary | ICD-10-CM

## 2021-05-03 NOTE — Telephone Encounter (Signed)
Please refill as per office routine med refill policy (all routine meds to be refilled for 3 mo or monthly (per pt preference) up to one year from last visit, then month to month grace period for 3 mo, then further med refills will have to be denied) ? ?

## 2021-05-17 ENCOUNTER — Other Ambulatory Visit: Payer: Self-pay

## 2021-05-17 ENCOUNTER — Ambulatory Visit (INDEPENDENT_AMBULATORY_CARE_PROVIDER_SITE_OTHER): Payer: Medicare Other

## 2021-05-17 DIAGNOSIS — E538 Deficiency of other specified B group vitamins: Secondary | ICD-10-CM | POA: Diagnosis not present

## 2021-05-17 MED ORDER — CYANOCOBALAMIN 1000 MCG/ML IJ SOLN
1000.0000 ug | Freq: Once | INTRAMUSCULAR | Status: AC
  Administered 2021-05-17: 1000 ug via INTRAMUSCULAR

## 2021-05-17 NOTE — Progress Notes (Signed)
Pt was given B12 injection w/o any complications. 

## 2021-06-13 DIAGNOSIS — Z8546 Personal history of malignant neoplasm of prostate: Secondary | ICD-10-CM | POA: Diagnosis not present

## 2021-06-19 ENCOUNTER — Other Ambulatory Visit: Payer: Self-pay

## 2021-06-19 ENCOUNTER — Ambulatory Visit (INDEPENDENT_AMBULATORY_CARE_PROVIDER_SITE_OTHER): Payer: Medicare Other

## 2021-06-19 DIAGNOSIS — E538 Deficiency of other specified B group vitamins: Secondary | ICD-10-CM | POA: Diagnosis not present

## 2021-06-19 MED ORDER — CYANOCOBALAMIN 1000 MCG/ML IJ SOLN
1000.0000 ug | Freq: Once | INTRAMUSCULAR | Status: AC
  Administered 2021-06-19: 1000 ug via INTRAMUSCULAR

## 2021-06-19 NOTE — Progress Notes (Signed)
Patient ID: [name], male   DOB: 1931-08-22, 87 y.o.   MRN: [id number]  Medical screening examination/treatment/procedure(s) were performed by non-physician practitioner and as supervising physician I was immediately available for consultation/collaboration.  I agree with above. Cathlean Cower, MD

## 2021-06-19 NOTE — Progress Notes (Signed)
Patient here for monthly B12 injection per Dr. Jenny Reichmann. B12 1000 mcg given IM and patient tolerated injection well today.

## 2021-06-20 DIAGNOSIS — Z8546 Personal history of malignant neoplasm of prostate: Secondary | ICD-10-CM | POA: Diagnosis not present

## 2021-06-20 DIAGNOSIS — N481 Balanitis: Secondary | ICD-10-CM | POA: Diagnosis not present

## 2021-06-23 DIAGNOSIS — H2513 Age-related nuclear cataract, bilateral: Secondary | ICD-10-CM | POA: Diagnosis not present

## 2021-07-13 ENCOUNTER — Ambulatory Visit (INDEPENDENT_AMBULATORY_CARE_PROVIDER_SITE_OTHER): Payer: Medicare Other

## 2021-07-13 ENCOUNTER — Telehealth: Payer: Self-pay

## 2021-07-13 DIAGNOSIS — I5022 Chronic systolic (congestive) heart failure: Secondary | ICD-10-CM | POA: Diagnosis not present

## 2021-07-13 DIAGNOSIS — I255 Ischemic cardiomyopathy: Secondary | ICD-10-CM

## 2021-07-13 LAB — CUP PACEART REMOTE DEVICE CHECK
Battery Remaining Longevity: 41 mo
Battery Remaining Percentage: 62 %
Battery Voltage: 2.96 V
Brady Statistic AP VP Percent: 86 %
Brady Statistic AP VS Percent: 1 %
Brady Statistic AS VP Percent: 14 %
Brady Statistic AS VS Percent: 1 %
Brady Statistic RA Percent Paced: 85 %
Date Time Interrogation Session: 20221208020009
Implantable Lead Implant Date: 20050509
Implantable Lead Implant Date: 20050509
Implantable Lead Implant Date: 20200116
Implantable Lead Location: 753858
Implantable Lead Location: 753859
Implantable Lead Location: 753860
Implantable Lead Model: 1580
Implantable Pulse Generator Implant Date: 20200116
Lead Channel Impedance Value: 240 Ohm
Lead Channel Impedance Value: 360 Ohm
Lead Channel Impedance Value: 700 Ohm
Lead Channel Pacing Threshold Amplitude: 0.75 V
Lead Channel Pacing Threshold Amplitude: 0.875 V
Lead Channel Pacing Threshold Amplitude: 1.25 V
Lead Channel Pacing Threshold Pulse Width: 0.5 ms
Lead Channel Pacing Threshold Pulse Width: 0.5 ms
Lead Channel Pacing Threshold Pulse Width: 1 ms
Lead Channel Sensing Intrinsic Amplitude: 1.5 mV
Lead Channel Sensing Intrinsic Amplitude: 5.1 mV
Lead Channel Setting Pacing Amplitude: 1.75 V
Lead Channel Setting Pacing Amplitude: 2 V
Lead Channel Setting Pacing Amplitude: 5 V
Lead Channel Setting Pacing Pulse Width: 0.5 ms
Lead Channel Setting Pacing Pulse Width: 1 ms
Lead Channel Setting Sensing Sensitivity: 4 mV
Pulse Gen Model: 3562
Pulse Gen Serial Number: 9097773

## 2021-07-13 NOTE — Telephone Encounter (Signed)
Scheduled remote reviewed. Normal device function.   A cap at high output, route to triage Next remote 91 days. LR  Device CMA has left message for patient to call device clinic. Will request manual transmission to confirm high output.

## 2021-07-13 NOTE — Telephone Encounter (Signed)
LMOVM for patient to send a manual transmission.

## 2021-07-17 NOTE — Telephone Encounter (Signed)
Unscheduled manual transmission received. Follow up as scheduled Requested to assess A cap, 1.875V @ 0.72ms

## 2021-07-20 ENCOUNTER — Ambulatory Visit (INDEPENDENT_AMBULATORY_CARE_PROVIDER_SITE_OTHER): Payer: Medicare Other

## 2021-07-20 ENCOUNTER — Other Ambulatory Visit: Payer: Self-pay

## 2021-07-20 DIAGNOSIS — E538 Deficiency of other specified B group vitamins: Secondary | ICD-10-CM

## 2021-07-20 MED ORDER — CYANOCOBALAMIN 1000 MCG/ML IJ SOLN
1000.0000 ug | Freq: Once | INTRAMUSCULAR | Status: AC
  Administered 2021-07-20: 1000 ug via INTRAMUSCULAR

## 2021-07-20 NOTE — Progress Notes (Signed)
Chief Complaint  Patient presents with   Follow-up    CAD    History of Present Illness: 87 yo male with history of complete heart block, CAD s/p stenting of the LAD in 2005 and 2008, ischemic cardiomyopathy s/p BiV ICD ( implanted 2005/generator change 2011 and upgrade to BiV ICD in 2020), HLD, HTN and chronic systolic CHF here today for cardiac follow up. His ICD is followed by Dr. [name]. Echo July 2016 with LVEF=40-45%, mild AI, trivial MR. Diagnosis of prostate cancer in 2014. I saw him in October 2019 and he c/o jaw pain and tooth pain with dyspnea with moderate exertion. Nuclear stress test June 18, 2018 with medium sized inferior, inferoseptal and apical defect with scar and possible soft tissue attenuation but no ischemia. LVEF noted to be 35% with hypokinesis of the inferior, inferoseptal and apical walls. Echo September 2020 with LVEF=50-55%, mild MR.   He is here today for follow up. The patient denies any chest pain, dyspnea, palpitations, lower extremity edema, orthopnea, PND, dizziness, near syncope or syncope. No changes in chronic jaw pain with moderate exertion.    Primary Care Physician: Biagio Borg, MD  Past Medical History:  Diagnosis Date   AICD (automatic cardioverter/defibrillator) present    "turned it off today, put in an extra lead,  and made it just a pacemaker" (08/21/2018)   Anemia, unspecified    Aortic stenosis    Mild, echo, October, 2011   Arthritis    "joints; arms"  (08/21/2018)   AV block, complete ([place])    Permanent pacemaker   CAD (coronary artery disease)    DES to LAD 2005 /  DES to LAD 2008   Cardiomyopathy    Ischemic, EF 30%, 2005   Chronic bronchitis (HCC)    DJD (degenerative joint disease)    Dyslipidemia    Ejection fraction < 50%    EF 30%, echo, 2005  /  EF 30%, catheterization, 2008   GERD (gastroesophageal reflux disease)    Hyperlipidemia    Hypertension    Hypothyroidism    ICD (implantable cardiac defibrillator),  single, in situ    for syncope and arrhythmia 2005 / generator change June, 2011   Iron deficiency anemia 06/17/2018   LBBB (left bundle branch block)    Mitral regurgitation    Mild, echo, October, 2011   Myocardial infarction [facility]) 1989   Presence of permanent cardiac pacemaker    Prostate cancer [facility])    Rectal fissure    Syncope    2005, ICD placed   Systolic CHF, chronic ([place])    Vitamin B12 deficiency     Past Surgical History:  Procedure Laterality Date   BIV UPGRADE  08/21/2018   BIV UPGRADE N/A 08/21/2018   Procedure: BIV UPGRADE;  Surgeon: Evans Lance, MD;  Location: [facility];  Service: Cardiovascular;  Laterality: N/A;   CARDIAC CATHETERIZATION  2008   ICD IMPLANT  2005   INSERTION PROSTATE RADIATION SEED     LAPAROSCOPIC CHOLECYSTECTOMY  04/2005   Dr. Lucia Gaskins   PROSTATE BIOPSY     RECTAL DILATATION     "stretched it twice" (08/21/2018)   SHOULDER OPEN ROTATOR CUFF REPAIR Right     Current Outpatient Medications  Medication Sig Dispense Refill   acetaminophen (TYLENOL) 650 MG CR tablet Take 1,300 mg by mouth 2 (two) times daily.     CALCIUM PO Take 2 tablets by mouth daily.      Cholecalciferol (VITAMIN  D) 50 MCG (2000 UT) CAPS Take 2,000 Units by mouth daily.     clopidogrel (PLAVIX) 75 MG tablet Take 1 tablet (75 mg total) by mouth daily. 90 tablet 1   ezetimibe (ZETIA) 10 MG tablet Take 1 tablet (10 mg total) by mouth daily. 90 tablet 3   fish oil-omega-3 fatty acids 1000 MG capsule Take 1 g by mouth 2 (two) times daily.     gemfibrozil (LOPID) 600 MG tablet TAKE 1 TABLET BY MOUTH DAILY. GENERIC EQUIVALENT FOR LOPID 90 tablet 3   levothyroxine (SYNTHROID) 50 MCG tablet Take 1 tablet (50 mcg total) by mouth daily before breakfast. 90 tablet 3   metoprolol succinate (TOPROL-XL) 50 MG 24 hr tablet Take 1 tablet (50 mg total) by mouth daily. Take with or immediately following a meal. 90 tablet 3   Multiple Vitamins-Minerals (MENS MULTIVITAMIN PLUS PO)  Take 1 tablet by mouth daily.     nystatin cream (MYCOSTATIN) Apply topically as directed.     omeprazole (PRILOSEC) 20 MG capsule TAKE ONE CAPSULE BY MOUTH DAILY 90 capsule 0   polyethylene glycol (MIRALAX / GLYCOLAX) packet Take 17 g by mouth See admin instructions. Take 17g mixed with liquid once daily, may do a second dose as needed for constipation     ramipril (ALTACE) 10 MG capsule Take 1 capsule (10 mg total) by mouth daily. 90 capsule 3   simvastatin (ZOCOR) 40 MG tablet Take 1 tablet (40 mg total) by mouth at bedtime. 90 tablet 3   No current facility-administered medications for this visit.    Allergies  Allergen Reactions   Pantoprazole Other (See Comments)    B12 levels dropped on medication    Social History   Socioeconomic History   Marital status: Widowed    Spouse name: Not on file   Number of children: 2   Years of education: Not on file   Highest education level: Not on file  Occupational History   Occupation: repairs looms at [employer]  Tobacco Use   Smoking status: Former    Packs/day: 1.00    Years: 2.00    Pack years: 2.00    Types: Cigarettes    Quit date: 08/06/1952    Years since quitting: 69.0   Smokeless tobacco: Never  Vaping Use   Vaping Use: Never used  Substance and Sexual Activity   Alcohol use: Never   Drug use: Never   Sexual activity: Not Currently  Other Topics Concern   Not on file  Social History Narrative   Not on file   Social Determinants of Health   Financial Resource Strain: Not on file  Food Insecurity: Not on file  Transportation Needs: Not on file  Physical Activity: Not on file  Stress: Not on file  Social Connections: Not on file  Intimate Partner Violence: Not on file    Family History  Problem Relation Age of Onset   Stroke Mother    Cancer Mother 81       unknown   Heart attack Father 107   Heart Problems Father    Heart attack Brother    Lung cancer Brother    Stroke Brother    Heart Problems Brother         CABG   Lung cancer Sister    Other Sister        PACEMAKER   Breast cancer Maternal Aunt     Review of Systems:  As stated in the HPI and otherwise negative.  BP (!) 148/75    Pulse (!) 52    Ht 5' 11.5" (1.816 m)    Wt 170 lb 9.6 oz (77.4 kg)    SpO2 99%    BMI 23.46 kg/m   Physical Examination:  General: Well developed, well nourished, NAD  HEENT: OP clear, mucus membranes moist  SKIN: warm, dry. No rashes. Neuro: No focal deficits  Musculoskeletal: Muscle strength 5/5 all ext  Psychiatric: Mood and affect normal  Neck: No JVD, no carotid bruits, no thyromegaly, no lymphadenopathy.  Lungs:Clear bilaterally, no wheezes, rhonci, crackles Cardiovascular: Regular rate and rhythm. No murmurs, gallops or rubs. Abdomen:Soft. Bowel sounds present. Non-tender.  Extremities: No lower extremity edema. Pulses are 2 + in the bilateral DP/PT.  Echo September 2020:  1. Left ventricular ejection fraction, by visual estimation, is 50 to  55%. The left ventricle has normal function. There is no left ventricular  hypertrophy.   2. Left ventricular diastolic Doppler parameters are indeterminate  pattern of LV diastolic filling.   3. Global longitudinal strain= =17%.   4. Global right ventricle has normal systolic function.The right  ventricular size is normal. No increase in right ventricular wall  thickness.   5. Mild mitral valve regurgitation.   6. The tricuspid valve is normal in structure. Tricuspid valve  regurgitation is trivial.   7. The aortic valve is tricuspid Aortic valve regurgitation is trivial by  color flow Doppler. Mild to moderate aortic valve sclerosis/calcification  without any evidence of aortic stenosis.   EKG:  EKG is not ordered today. The ekg ordered today demonstrates   Recent Labs: 01/24/2021: ALT 11; BUN 27; Creatinine, Ser 1.87; Hemoglobin 9.5; Platelets 196.0; Potassium 5.0; Sodium 139; TSH 2.48   Lipid Panel    Component Value Date/Time   CHOL  174 01/24/2021 0846   TRIG 165.0 (H) 01/24/2021 0846   HDL 40.20 01/24/2021 0846   CHOLHDL 4 01/24/2021 0846   VLDL 33.0 01/24/2021 0846   LDLCALC 100 (H) 01/24/2021 0846   LDLDIRECT 96.3 02/26/2011 0735     Wt Readings from Last 3 Encounters:  07/21/21 170 lb 9.6 oz (77.4 kg)  01/25/21 169 lb 12.8 oz (77 kg)  01/24/21 169 lb 12.8 oz (77 kg)     Other studies Reviewed: Additional studies/ records that were reviewed today include: . Review of the above records demonstrates:   Assessment and Plan:   1. CAD with angina: No chest pain. No jaw pain lately either. Nuclear stress test in 2019 with no ischemia. Will continue Plavix, statin and beta blocker.    Stop ASA.   2. Ischemic cardiomyopathy/Chronic systolic CHF: Echo September 2020 with LVEF=50-55%. BiV ICD is in place. Weight is stable. No volume overload on exam. Will continue Ramipril and Toprol.      3. HTN: BP is controlled at home. Continue current therapy  4. HLD: His lipids are followed in primary care. LDL goal under 55. Last LDL 100 in June 2022. He is on Zocor 40 mg daily. He does not wish to change statins or increase the Zocor. Will add Zetia 10 mg daily and check lipids and LFTs in 12 weeks.   5. Complete heart block: BiV ICD in place. Followed in EP/Pacer clinic.   6. Aortic valve disease: Trivial AI by echo September 2020.   Current medicines are reviewed at length with the patient today.  The patient does not have concerns regarding medicines.  The following changes have been made:  no change  Labs/  tests ordered today include:   Orders Placed This Encounter  Procedures   Hepatic function panel   Lipid panel     Disposition:   F/U with me in 12 months  Signed, Lauree Chandler, MD 07/21/2021 12:24 PM    [facility] [place], [place], [place]  [address] Phone: [phone]; Fax: [phone]

## 2021-07-20 NOTE — Progress Notes (Signed)
Pt came into the office to receive his b12 injection. He tolerated the injection well.

## 2021-07-21 ENCOUNTER — Ambulatory Visit (INDEPENDENT_AMBULATORY_CARE_PROVIDER_SITE_OTHER): Payer: Medicare Other | Admitting: Cardiovascular Disease

## 2021-07-21 ENCOUNTER — Encounter: Payer: Self-pay | Admitting: Cardiovascular Disease

## 2021-07-21 VITALS — BP 148/75 | HR 52 | Ht 71.5 in | Wt 170.6 lb

## 2021-07-21 DIAGNOSIS — E7800 Pure hypercholesterolemia, unspecified: Secondary | ICD-10-CM

## 2021-07-21 DIAGNOSIS — I25118 Atherosclerotic heart disease of native coronary artery with other forms of angina pectoris: Secondary | ICD-10-CM

## 2021-07-21 DIAGNOSIS — I255 Ischemic cardiomyopathy: Secondary | ICD-10-CM

## 2021-07-21 DIAGNOSIS — I10 Essential (primary) hypertension: Secondary | ICD-10-CM

## 2021-07-21 DIAGNOSIS — I442 Atrioventricular block, complete: Secondary | ICD-10-CM | POA: Diagnosis not present

## 2021-07-21 MED ORDER — EZETIMIBE 10 MG PO TABS: 10.0000 mg | ORAL_TABLET | Freq: Every day | ORAL | 3 refills | Status: DC

## 2021-07-21 NOTE — Progress Notes (Signed)
Remote pacemaker transmission.   

## 2021-07-21 NOTE — Patient Instructions (Addendum)
Medication Instructions:  Your physician has recommended you make the following change in your medication:  1.) start ezetimibe (Zetia) 10 mg - take one tablet daily  2) Discontinue Aspirin   *If you need a refill on your cardiac medications before your next appointment, please call your pharmacy*   Lab Work: Please return for lab work (Lipids/liver function) in 3 months   Testing/Procedures: none   Follow-Up: At [facility], you and your health needs are our priority.  As part of our continuing mission to provide you with exceptional heart care, we have created designated Provider Care Teams.  These Care Teams include your primary Cardiologist (physician) and Advanced Practice Providers (APPs -  Physician Assistants and Nurse Practitioners) who all work together to provide you with the care you need, when you need it.  We recommend signing up for the patient portal called "MyChart".  Sign up information is provided on this After Visit Summary.  MyChart is used to connect with patients for Virtual Visits (Telemedicine).  Patients are able to view lab/test results, encounter notes, upcoming appointments, etc.  Non-urgent messages can be sent to your provider as well.   To learn more about what you can do with MyChart, go to [email or web address].    Your next appointment:   12 month(s)  The format for your next appointment:   In Person  Provider:   Lauree Chandler, MD     Other Instructions

## 2021-08-20 ENCOUNTER — Other Ambulatory Visit: Payer: Self-pay | Admitting: Internal Medicine

## 2021-08-21 ENCOUNTER — Other Ambulatory Visit: Payer: Self-pay

## 2021-08-21 ENCOUNTER — Ambulatory Visit (INDEPENDENT_AMBULATORY_CARE_PROVIDER_SITE_OTHER): Payer: Medicare Other

## 2021-08-21 DIAGNOSIS — E538 Deficiency of other specified B group vitamins: Secondary | ICD-10-CM

## 2021-08-21 MED ORDER — CYANOCOBALAMIN 1000 MCG/ML IJ SOLN
1000.0000 ug | Freq: Once | INTRAMUSCULAR | Status: AC
  Administered 2021-08-21: 1000 ug via INTRAMUSCULAR

## 2021-08-21 NOTE — Progress Notes (Signed)
Pt was given B12 w/o any complications. 

## 2021-08-29 ENCOUNTER — Telehealth: Payer: Self-pay

## 2021-08-29 DIAGNOSIS — I2510 Atherosclerotic heart disease of native coronary artery without angina pectoris: Secondary | ICD-10-CM

## 2021-08-29 NOTE — Telephone Encounter (Signed)
Pt daughter calling in for a refill on pt behave for: omeprazole (PRILOSEC) 20 MG capsule  Pharmacy: [facility] ([place]) [place], [place] 01/24/21

## 2021-08-30 MED ORDER — OMEPRAZOLE 20 MG PO CPDR: 20.0000 mg | DELAYED_RELEASE_CAPSULE | Freq: Every day | ORAL | 3 refills | Status: DC

## 2021-09-21 ENCOUNTER — Other Ambulatory Visit: Payer: Self-pay

## 2021-09-21 ENCOUNTER — Ambulatory Visit (INDEPENDENT_AMBULATORY_CARE_PROVIDER_SITE_OTHER): Payer: Medicare Other

## 2021-09-21 DIAGNOSIS — E538 Deficiency of other specified B group vitamins: Secondary | ICD-10-CM

## 2021-09-21 MED ORDER — CYANOCOBALAMIN 1000 MCG/ML IJ SOLN
1000.0000 ug | Freq: Once | INTRAMUSCULAR | Status: AC
  Administered 2021-09-21: 1000 ug via INTRAMUSCULAR

## 2021-09-21 NOTE — Progress Notes (Signed)
Pt was given B12 w/o any complications. 

## 2021-10-12 ENCOUNTER — Ambulatory Visit (INDEPENDENT_AMBULATORY_CARE_PROVIDER_SITE_OTHER): Payer: Medicare Other

## 2021-10-12 ENCOUNTER — Telehealth: Payer: Self-pay

## 2021-10-12 DIAGNOSIS — I255 Ischemic cardiomyopathy: Secondary | ICD-10-CM

## 2021-10-12 DIAGNOSIS — I5022 Chronic systolic (congestive) heart failure: Secondary | ICD-10-CM

## 2021-10-12 LAB — CUP PACEART REMOTE DEVICE CHECK
Battery Remaining Longevity: 38 mo
Battery Remaining Percentage: 58 %
Battery Voltage: 2.96 V
Brady Statistic AP VP Percent: 86 %
Brady Statistic AP VS Percent: 1 %
Brady Statistic AS VP Percent: 14 %
Brady Statistic AS VS Percent: 1 %
Brady Statistic RA Percent Paced: 85 %
Date Time Interrogation Session: 20230309020022
Implantable Lead Implant Date: 20050509
Implantable Lead Implant Date: 20050509
Implantable Lead Implant Date: 20200116
Implantable Lead Location: 753858
Implantable Lead Location: 753859
Implantable Lead Location: 753860
Implantable Lead Model: 1580
Implantable Pulse Generator Implant Date: 20200116
Lead Channel Impedance Value: 280 Ohm
Lead Channel Impedance Value: 360 Ohm
Lead Channel Impedance Value: 690 Ohm
Lead Channel Pacing Threshold Amplitude: 0.75 V
Lead Channel Pacing Threshold Amplitude: 1 V
Lead Channel Pacing Threshold Amplitude: 1.25 V
Lead Channel Pacing Threshold Pulse Width: 0.5 ms
Lead Channel Pacing Threshold Pulse Width: 0.5 ms
Lead Channel Pacing Threshold Pulse Width: 1 ms
Lead Channel Sensing Intrinsic Amplitude: 1.4 mV
Lead Channel Sensing Intrinsic Amplitude: 5.1 mV
Lead Channel Setting Pacing Amplitude: 1.75 V
Lead Channel Setting Pacing Amplitude: 2 V
Lead Channel Setting Pacing Amplitude: 5 V
Lead Channel Setting Pacing Pulse Width: 0.5 ms
Lead Channel Setting Pacing Pulse Width: 1 ms
Lead Channel Setting Sensing Sensitivity: 4 mV
Pulse Gen Model: 3562
Pulse Gen Serial Number: 9097773

## 2021-10-12 NOTE — Telephone Encounter (Signed)
Scheduled remote reviewed. Normal device function.   ?A cap at high output, route to triage ?Next remote 91 days. ?LA ? ? ?Successful telephone encounter to patient to request manual transmission to confirm RA threshold and output. Patient is not currently at home however when he returns he will call device clinic for assistance with sending manual transmission.  ? ? ? ? ? ? ? ?

## 2021-10-12 NOTE — Telephone Encounter (Signed)
I walk the patient through on how to send the transmission. He has a land line so I could not be on the phone with him the whole time.  ?

## 2021-10-12 NOTE — Telephone Encounter (Signed)
I called [facility] and they have a delay on their end. I asked how long will it take for the transmissions to cross over to our system and she did not know. The only update they had was that the IT was working to fix the issue.  ? ?The rep did say she was going to fax over the patient results for the nurse to look at. ?

## 2021-10-13 ENCOUNTER — Other Ambulatory Visit: Payer: Self-pay

## 2021-10-13 ENCOUNTER — Other Ambulatory Visit: Payer: Medicare Other | Admitting: *Deleted

## 2021-10-13 DIAGNOSIS — I442 Atrioventricular block, complete: Secondary | ICD-10-CM

## 2021-10-13 DIAGNOSIS — I25118 Atherosclerotic heart disease of native coronary artery with other forms of angina pectoris: Secondary | ICD-10-CM | POA: Diagnosis not present

## 2021-10-13 DIAGNOSIS — E7800 Pure hypercholesterolemia, unspecified: Secondary | ICD-10-CM | POA: Diagnosis not present

## 2021-10-13 DIAGNOSIS — I10 Essential (primary) hypertension: Secondary | ICD-10-CM

## 2021-10-13 DIAGNOSIS — I255 Ischemic cardiomyopathy: Secondary | ICD-10-CM

## 2021-10-13 LAB — HEPATIC FUNCTION PANEL
ALT: 11 IU/L (ref 0–44)
AST: 26 IU/L (ref 0–40)
Albumin: 4.7 g/dL — ABNORMAL HIGH (ref 3.6–4.6)
Alkaline Phosphatase: 60 IU/L (ref 44–121)
Bilirubin Total: 1 mg/dL (ref 0.0–1.2)
Bilirubin, Direct: 0.23 mg/dL (ref 0.00–0.40)
Total Protein: 6.6 g/dL (ref 6.0–8.5)

## 2021-10-13 LAB — LIPID PANEL
Chol/HDL Ratio: 3.2 ratio (ref 0.0–5.0)
Cholesterol, Total: 144 mg/dL (ref 100–199)
HDL: 45 mg/dL (ref >39)
LDL Chol Calc (NIH): 77 mg/dL (ref 0–99)
Triglycerides: 124 mg/dL (ref 0–149)
VLDL Cholesterol Cal: 22 mg/dL (ref 5–40)

## 2021-10-13 NOTE — Telephone Encounter (Signed)
Spoke with patient informed him that one of the leads is seemingly uses more energy than is likely necessary and we need to bring him into the office to check his pacemaker patient agreeable to appointment on 10/19/21 at 9:40am  ?

## 2021-10-19 ENCOUNTER — Ambulatory Visit (INDEPENDENT_AMBULATORY_CARE_PROVIDER_SITE_OTHER): Payer: Medicare Other

## 2021-10-19 ENCOUNTER — Other Ambulatory Visit: Payer: Self-pay

## 2021-10-19 DIAGNOSIS — I442 Atrioventricular block, complete: Secondary | ICD-10-CM | POA: Diagnosis not present

## 2021-10-19 LAB — CUP PACEART INCLINIC DEVICE CHECK
Battery Remaining Longevity: 40 mo
Battery Voltage: 2.96 V
Brady Statistic RA Percent Paced: 85 %
Brady Statistic RV Percent Paced: 99.84 %
Date Time Interrogation Session: 20230316093115
Implantable Lead Implant Date: 20050509
Implantable Lead Implant Date: 20050509
Implantable Lead Implant Date: 20200116
Implantable Lead Location: 753858
Implantable Lead Location: 753859
Implantable Lead Location: 753860
Implantable Lead Model: 1580
Implantable Pulse Generator Implant Date: 20200116
Lead Channel Impedance Value: 275 Ohm
Lead Channel Impedance Value: 387.5 Ohm
Lead Channel Impedance Value: 750 Ohm
Lead Channel Pacing Threshold Amplitude: 0.75 V
Lead Channel Pacing Threshold Amplitude: 0.75 V
Lead Channel Pacing Threshold Amplitude: 1 V
Lead Channel Pacing Threshold Amplitude: 1 V
Lead Channel Pacing Threshold Amplitude: 1 V
Lead Channel Pacing Threshold Amplitude: 1 V
Lead Channel Pacing Threshold Pulse Width: 0.5 ms
Lead Channel Pacing Threshold Pulse Width: 0.5 ms
Lead Channel Pacing Threshold Pulse Width: 0.5 ms
Lead Channel Pacing Threshold Pulse Width: 0.5 ms
Lead Channel Pacing Threshold Pulse Width: 1 ms
Lead Channel Pacing Threshold Pulse Width: 1 ms
Lead Channel Sensing Intrinsic Amplitude: 1.5 mV
Lead Channel Sensing Intrinsic Amplitude: 5.1 mV
Lead Channel Setting Pacing Amplitude: 1.75 V
Lead Channel Setting Pacing Amplitude: 2 V
Lead Channel Setting Pacing Amplitude: 2.5 V
Lead Channel Setting Pacing Pulse Width: 0.5 ms
Lead Channel Setting Pacing Pulse Width: 1 ms
Lead Channel Setting Sensing Sensitivity: 4 mV
Pulse Gen Model: 3562
Pulse Gen Serial Number: 9097773

## 2021-10-19 NOTE — Progress Notes (Signed)
CRT-P device check in clinic. Normal device function. Thresholds, sensing, impedance consistent with previous measurements. Histograms appropriate for patient and level of activity. No mode switches or ventricular high rate episodes. Patient bi-ventricularly pacing >99% of the time. RA/RV/LV threshold checked RA/RV auo feature turned off and output set at 2.0/2.5V.  Device programmed with appropriate safety margins. Device heart failure diagnostics are within normal limits and stable over time. Estimated longevity 3.5-4.5 years. Patient enrolled in remote follow-up 01/11/22. Plan to check device remotely in 3 months and every 6 months in office. ?

## 2021-10-19 NOTE — Patient Instructions (Signed)
CRT-P device check in clinic. Normal device function. Thresholds, sensing, impedance consistent with previous measurements. Histograms appropriate for patient and level of activity. No mode switches or ventricular high rate episodes. Patient bi-ventricularly pacing >99% of the time. RA/RV/LV threshold checked RA/RV auo feature turned off and output set at 2.0/2.5V.  Device programmed with appropriate safety margins. Device heart failure diagnostics are within normal limits and stable over time. Estimated longevity 3.5-4.5 years. Patient enrolled in remote follow-up 01/11/22. Plan to check device remotely in 3 months and every 6 months in office. ?

## 2021-10-20 ENCOUNTER — Ambulatory Visit (INDEPENDENT_AMBULATORY_CARE_PROVIDER_SITE_OTHER): Payer: Medicare Other

## 2021-10-20 DIAGNOSIS — E538 Deficiency of other specified B group vitamins: Secondary | ICD-10-CM | POA: Diagnosis not present

## 2021-10-20 MED ORDER — CYANOCOBALAMIN 1000 MCG/ML IJ SOLN
1000.0000 ug | Freq: Once | INTRAMUSCULAR | Status: AC
  Administered 2021-10-20: 1000 ug via INTRAMUSCULAR

## 2021-10-20 NOTE — Progress Notes (Signed)
After obtaining consent, and per orders of Dr. Jenny Reichmann, injection of B-12 given by Archie Balboa. Patient tolerated injection well and instructed to report any adverse reaction to me immediately. ? ?

## 2021-10-25 NOTE — Progress Notes (Signed)
Remote pacemaker transmission.   

## 2021-11-22 ENCOUNTER — Ambulatory Visit (INDEPENDENT_AMBULATORY_CARE_PROVIDER_SITE_OTHER): Payer: Medicare Other | Admitting: *Deleted

## 2021-11-22 DIAGNOSIS — E538 Deficiency of other specified B group vitamins: Secondary | ICD-10-CM | POA: Diagnosis not present

## 2021-11-22 MED ORDER — CYANOCOBALAMIN 1000 MCG/ML IJ SOLN
1000.0000 ug | Freq: Once | INTRAMUSCULAR | Status: AC
  Administered 2021-11-22: 1000 ug via INTRAMUSCULAR

## 2021-11-22 NOTE — Progress Notes (Signed)
Pls cosign for B12 inj../lmb  

## 2021-11-27 ENCOUNTER — Other Ambulatory Visit: Payer: Self-pay

## 2021-11-27 ENCOUNTER — Emergency Department (HOSPITAL_COMMUNITY): Payer: Medicare Other

## 2021-11-27 ENCOUNTER — Emergency Department (HOSPITAL_COMMUNITY)
Admission: EM | Admit: 2021-11-27 | Discharge: 2021-11-28 | Disposition: A | Discharge status: Home / Self Care | Payer: Medicare Other | Attending: Emergency Medicine | Admitting: Emergency Medicine

## 2021-11-27 ENCOUNTER — Encounter (HOSPITAL_COMMUNITY): Payer: Self-pay

## 2021-11-27 DIAGNOSIS — Z950 Presence of cardiac pacemaker: Secondary | ICD-10-CM | POA: Diagnosis not present

## 2021-11-27 DIAGNOSIS — Z20822 Contact with and (suspected) exposure to covid-19: Secondary | ICD-10-CM | POA: Insufficient documentation

## 2021-11-27 DIAGNOSIS — J069 Acute upper respiratory infection, unspecified: Secondary | ICD-10-CM | POA: Insufficient documentation

## 2021-11-27 DIAGNOSIS — I110 Hypertensive heart disease with heart failure: Secondary | ICD-10-CM | POA: Diagnosis not present

## 2021-11-27 DIAGNOSIS — Z79899 Other long term (current) drug therapy: Secondary | ICD-10-CM | POA: Insufficient documentation

## 2021-11-27 DIAGNOSIS — Z8546 Personal history of malignant neoplasm of prostate: Secondary | ICD-10-CM | POA: Insufficient documentation

## 2021-11-27 DIAGNOSIS — I509 Heart failure, unspecified: Secondary | ICD-10-CM | POA: Diagnosis not present

## 2021-11-27 DIAGNOSIS — E039 Hypothyroidism, unspecified: Secondary | ICD-10-CM | POA: Insufficient documentation

## 2021-11-27 DIAGNOSIS — I2510 Atherosclerotic heart disease of native coronary artery without angina pectoris: Secondary | ICD-10-CM | POA: Insufficient documentation

## 2021-11-27 DIAGNOSIS — R059 Cough, unspecified: Secondary | ICD-10-CM | POA: Diagnosis not present

## 2021-11-27 LAB — COMPREHENSIVE METABOLIC PANEL
ALT: 10 U/L (ref 0–44)
AST: 21 U/L (ref 15–41)
Albumin: 4 g/dL (ref 3.5–5.0)
Alkaline Phosphatase: 54 U/L (ref 38–126)
Anion gap: 8 (ref 5–15)
BUN: 24 mg/dL — ABNORMAL HIGH (ref 8–23)
CO2: 25 mmol/L (ref 22–32)
Calcium: 9.6 mg/dL (ref 8.9–10.3)
Chloride: 103 mmol/L (ref 98–111)
Creatinine, Ser: 1.92 mg/dL — ABNORMAL HIGH (ref 0.61–1.24)
GFR, Estimated: 33 mL/min — ABNORMAL LOW (ref >60)
Glucose, Bld: 118 mg/dL — ABNORMAL HIGH (ref 70–99)
Potassium: 4.9 mmol/L (ref 3.5–5.1)
Sodium: 136 mmol/L (ref 135–145)
Total Bilirubin: 1.4 mg/dL — ABNORMAL HIGH (ref 0.3–1.2)
Total Protein: 6.8 g/dL (ref 6.5–8.1)

## 2021-11-27 LAB — CBC WITH DIFFERENTIAL/PLATELET
Abs Immature Granulocytes: 0.03 10*3/uL (ref 0.00–0.07)
Basophils Absolute: 0 10*3/uL (ref 0.0–0.1)
Basophils Relative: 0 %
Eosinophils Absolute: 0.2 10*3/uL (ref 0.0–0.5)
Eosinophils Relative: 4 %
HCT: 27 % — ABNORMAL LOW (ref 39.0–52.0)
Hemoglobin: 9.1 g/dL — ABNORMAL LOW (ref 13.0–17.0)
Immature Granulocytes: 1 %
Lymphocytes Relative: 13 %
Lymphs Abs: 0.6 10*3/uL — ABNORMAL LOW (ref 0.7–4.0)
MCH: 33.2 pg (ref 26.0–34.0)
MCHC: 33.7 g/dL (ref 30.0–36.0)
MCV: 98.5 fL (ref 80.0–100.0)
Monocytes Absolute: 0.6 10*3/uL (ref 0.1–1.0)
Monocytes Relative: 12 %
Neutro Abs: 3.3 10*3/uL (ref 1.7–7.7)
Neutrophils Relative %: 70 %
Platelets: 189 10*3/uL (ref 150–400)
RBC: 2.74 MIL/uL — ABNORMAL LOW (ref 4.22–5.81)
RDW: 13.1 % (ref 11.5–15.5)
WBC: 4.8 10*3/uL (ref 4.0–10.5)
nRBC: 0 % (ref 0.0–0.2)

## 2021-11-27 LAB — BRAIN NATRIURETIC PEPTIDE: B Natriuretic Peptide: 138.9 pg/mL — ABNORMAL HIGH (ref 0.0–100.0)

## 2021-11-27 NOTE — ED Triage Notes (Signed)
Pt has had productive cough for 3 days. SOB with exertion. Today could not get a reading on home pulse oximeter. No SOB at rest. Denies chest pain ?

## 2021-11-27 NOTE — ED Provider Triage Note (Signed)
Emergency Medicine Provider Triage Evaluation Note ? ?[name] , a 88 y.o. male  was evaluated in triage.  Pt complains of cough and chest congestion.  Patient started having a runny nose a few days prior and then started coughing.  Cough is somewhat productive.  When productive patient describes mucus as gray. ? ?Patient denies any fever, chills, chest pain, shortness of breath, leg swelling or tenderness, abdominal pain, nausea, vomiting, diarrhea. ? ?Review of Systems  ?Positive: Cough, rhinorrhea ?Negative: See above ? ?Physical Exam  ?BP 114/60 (BP Location: Right Arm)   Pulse 62   Temp 98.8 ?F (37.1 ?C) (Oral)   Resp 18   SpO2 98%  ?Gen:   Awake, no distress   ?Resp:  Normal effort, clear to auscultation bilaterally.  Speaks in full sentences. ?MSK:   Moves extremities without difficulty; trace edema to bilateral lower extremities ?Other:   ? ?Medical Decision Making  ?Medically screening exam initiated at 8:05 PM.  Appropriate orders placed.  [name] was informed that the remainder of the evaluation will be completed by another provider, this initial triage assessment does not replace that evaluation, and the importance of remaining in the ED until their evaluation is complete. ? ? ?  ?Loni Beckwith, PA-C ?11/27/21 2006 ? ?

## 2021-11-28 DIAGNOSIS — J069 Acute upper respiratory infection, unspecified: Secondary | ICD-10-CM | POA: Diagnosis not present

## 2021-11-28 LAB — RESP PANEL BY RT-PCR (FLU A&B, COVID) ARPGX2
Influenza A by PCR: NEGATIVE
Influenza B by PCR: NEGATIVE
SARS Coronavirus 2 by RT PCR: NEGATIVE

## 2021-11-28 NOTE — Discharge Instructions (Signed)
Were seen today for upper respiratory symptoms and cough.  Your work-up is largely reassuring.  Chest x-ray shows no evidence of pneumonia.  Your COVID and influenza testing are pending.  Continue supportive measures at home.  Use Flonase or nasal saline.  You can use Mucinex.  Avoid medications with pseudoephedrine in them. ?

## 2021-11-28 NOTE — ED Provider Notes (Signed)
?Jette ?Provider Note ? ? ?CSN: [id number] ?Arrival date & time: 11/27/21  1757 ? ?  ? ?History ? ?Chief Complaint  ?Patient presents with  ? Shortness of Breath  ? ? ?[name] is a 88 y.o. male. ? ?HPI ? ?  ? ?This is an 87 year old male with a history of cardiomyopathy, CHF, hypertension who presents with cough and upper respiratory congestion.  Patient has had approximately 7 days of symptoms.  Initially symptoms were mostly nasal congestion and nonproductive cough.  However over the last several days the cough has become more productive.  No known sick contacts.  He has not had any fevers or chills.  No nausea, vomiting, diarrhea.  He denies any chest pain or shortness of breath. ? ?Home Medications ?Prior to Admission medications   ?Medication Sig Start Date End Date Taking? Authorizing Provider  ?acetaminophen (TYLENOL) 650 MG CR tablet Take 1,300 mg by mouth 2 (two) times daily.    [provider]  ?CALCIUM PO Take 2 tablets by mouth daily.     [provider]  ?Cholecalciferol (VITAMIN D) 50 MCG (2000 UT) CAPS Take 2,000 Units by mouth daily.    [provider]  ?clopidogrel (PLAVIX) 75 MG tablet TAKE 1 TABLET BY MOUTH DAILY 08/20/21   Biagio Borg, MD  ?ezetimibe (ZETIA) 10 MG tablet Take 1 tablet (10 mg total) by mouth daily. 07/21/21 10/19/21  Burnell Blanks, MD  ?fish oil-omega-3 fatty acids 1000 MG capsule Take 1 g by mouth 2 (two) times daily.    [provider]  ?gemfibrozil (LOPID) 600 MG tablet TAKE 1 TABLET BY MOUTH DAILY. GENERIC EQUIVALENT FOR LOPID 04/11/21   Biagio Borg, MD  ?levothyroxine (SYNTHROID) 50 MCG tablet TAKE 1 TABLET BY MOUTH DAILY BEFORE BREAKFAST 08/20/21   Biagio Borg, MD  ?metoprolol succinate (TOPROL-XL) 50 MG 24 hr tablet Take 1 tablet (50 mg total) by mouth daily. Take with or immediately following a meal. 01/24/21   Biagio Borg, MD  ?Multiple Vitamins-Minerals (MENS MULTIVITAMIN  PLUS PO) Take 1 tablet by mouth daily.    [provider]  ?nystatin cream (MYCOSTATIN) Apply topically as directed. 12/12/20   [provider]  ?omeprazole (PRILOSEC) 20 MG capsule Take 1 capsule (20 mg total) by mouth daily. 08/30/21   Biagio Borg, MD  ?polyethylene glycol [facility]) packet Take 17 g by mouth See admin instructions. Take 17g mixed with liquid once daily, may do a second dose as needed for constipation    [provider]  ?ramipril (ALTACE) 10 MG capsule Take 1 capsule (10 mg total) by mouth daily. 02/14/21   Biagio Borg, MD  ?simvastatin (ZOCOR) 40 MG tablet Take 1 tablet (40 mg total) by mouth at bedtime. 02/14/21   Biagio Borg, MD  ?   ? ?Allergies    ?Pantoprazole   ? ?Review of Systems   ?Review of Systems  ?Constitutional:  Negative for fever.  ?Respiratory:  Positive for cough. Negative for shortness of breath.   ?Cardiovascular:  Negative for chest pain and leg swelling.  ?Gastrointestinal:  Negative for abdominal pain and vomiting.  ?All other systems reviewed and are negative. ? ?Physical Exam ?Updated Vital Signs ?BP 110/60   Pulse (!) 56   Temp 98.4 ?F (36.9 ?C) (Oral)   Resp 20   Ht 1.803 m ('5\' 11"'$ )   Wt 72.6 kg   SpO2 96%   BMI 22.32  kg/m?  ?Physical Exam ?Vitals and nursing note reviewed.  ?Constitutional:   ?   Appearance: He is well-developed.  ?   Comments: Elderly, nontoxic-appearing  ?HENT:  ?   Head: Normocephalic and atraumatic.  ?   Mouth/Throat:  ?   Mouth: Mucous membranes are moist.  ?Eyes:  ?   Pupils: Pupils are equal, round, and reactive to light.  ?Cardiovascular:  ?   Rate and Rhythm: Normal rate and regular rhythm.  ?   Heart sounds: Normal heart sounds. No murmur heard. ?Pulmonary:  ?   Effort: Pulmonary effort is normal. No respiratory distress.  ?   Breath sounds: Normal breath sounds. No decreased breath sounds or wheezing.  ?Abdominal:  ?   General: Bowel sounds are normal.  ?   Palpations: Abdomen is soft.  ?    Tenderness: There is no abdominal tenderness. There is no rebound.  ?Musculoskeletal:  ?   Cervical back: Neck supple.  ?   Right lower leg: No edema.  ?   Left lower leg: No edema.  ?Lymphadenopathy:  ?   Cervical: No cervical adenopathy.  ?Skin: ?   General: Skin is warm and dry.  ?Neurological:  ?   Mental Status: He is alert and oriented to person, place, and time.  ?Psychiatric:     ?   Mood and Affect: Mood normal.  ? ? ?ED Results / Procedures / Treatments   ?Labs ?(all labs ordered are listed, but only abnormal results are displayed) ?Labs Reviewed  ?COMPREHENSIVE METABOLIC PANEL - Abnormal; Notable for the following components:  ?    Result Value  ? Glucose, Bld 118 (*)   ? BUN 24 (*)   ? Creatinine, Ser 1.92 (*)   ? Total Bilirubin 1.4 (*)   ? GFR, Estimated 33 (*)   ? All other components within normal limits  ?CBC WITH DIFFERENTIAL/PLATELET - Abnormal; Notable for the following components:  ? RBC 2.74 (*)   ? Hemoglobin 9.1 (*)   ? HCT 27.0 (*)   ? Lymphs Abs 0.6 (*)   ? All other components within normal limits  ?BRAIN NATRIURETIC PEPTIDE - Abnormal; Notable for the following components:  ? B Natriuretic Peptide 138.9 (*)   ? All other components within normal limits  ?RESP PANEL BY RT-PCR (FLU A&B, COVID) [id number]  ? ? ?EKG ?None ? ?Radiology ?DG Chest 2 View ? ?Result Date: 11/27/2021 ?CLINICAL DATA:  Cough. EXAM: CHEST - 2 VIEW COMPARISON:  Chest x-ray 09/04/2018 FINDINGS: Left-sided ICD is again seen. The heart size and mediastinal contours are within normal limits. Both lungs are clear. The visualized skeletal structures are unremarkable. IMPRESSION: No active cardiopulmonary disease. Electronically Signed   By: Ronney Asters M.D.   On: 11/27/2021 20:51   ? ?Procedures ?Procedures  ? ? ?Medications Ordered in ED ?Medications - No data to display ? ?ED Course/ Medical Decision Making/ A&P ?  ?                        ?Medical Decision Making ? ?This patient presents to the ED for concern of chest  congestion, cough, this involves an extensive number of treatment options, and is a complaint that carries with it a high risk of complications and morbidity.  The differential diagnosis includes viral upper respiratory infection, seasonal allergies, pneumonia ? ?MDM:   ? ?This is an 88 year old male who presents with concerns for cough and upper respiratory congestion.  He is  nontoxic and vital signs are reassuring.  He is afebrile.  O2 sats 96%.  He is in no respiratory distress.  Breath sounds are clear.  Chest x-ray without pneumothorax or pneumonia.  Labs obtained.  No evidence of leukocytosis.  No significant metabolic derangements.  Labs are at baseline.  COVID and influenza testing are pending.  At this time, patient has been waiting for 10 hours.  His family was most concerned he may have pneumonia.  Given that he is afebrile and has clear breath sounds, have low suspicion clinically for pneumonia and x-ray does not show any.  Will continue supportive measures at home.  I will call them if COVID and influenza testing come back positive.  He is out of the window for treatment as he has had 7 days of symptoms.  Patient was ambulated in the hallway and maintained O2 sats of 96%.  No respiratory distress. ?(Labs, imaging) ? ?Labs: ?I Ordered, and personally interpreted labs.  The pertinent results include: CBC, BMP ? ?Imaging Studies ordered: ?I ordered imaging studies including x-ray ?I independently visualized and interpreted imaging. ?I agree with the radiologist interpretation ? ?Additional history obtained from family at bedside.  External records from outside source obtained and reviewed including prior evaluation ? ?Critical Interventions: ?N/A ? ?Consultations: ?I requested consultation with the NA,  and discussed lab and imaging findings as well as pertinent plan - they recommend: N/A ? ?Cardiac Monitoring: ?The patient was maintained on a cardiac monitor.  I personally viewed and interpreted the  cardiac monitored which showed an underlying rhythm of: Normal sinus ? ?Reevaluation: ?After the interventions noted above, I reevaluated the patient and found that they have :improved ? ? ?Considered admission for: Upper

## 2021-11-28 NOTE — ED Notes (Signed)
Pt able to ambulate on the hallway with SPO2 96% on RA.  ?

## 2021-12-12 DIAGNOSIS — Z8546 Personal history of malignant neoplasm of prostate: Secondary | ICD-10-CM | POA: Diagnosis not present

## 2021-12-19 DIAGNOSIS — Z8546 Personal history of malignant neoplasm of prostate: Secondary | ICD-10-CM | POA: Diagnosis not present

## 2021-12-19 DIAGNOSIS — C61 Malignant neoplasm of prostate: Secondary | ICD-10-CM | POA: Diagnosis not present

## 2021-12-25 ENCOUNTER — Ambulatory Visit (INDEPENDENT_AMBULATORY_CARE_PROVIDER_SITE_OTHER): Payer: Medicare Other

## 2021-12-25 DIAGNOSIS — E538 Deficiency of other specified B group vitamins: Secondary | ICD-10-CM

## 2021-12-25 MED ORDER — CYANOCOBALAMIN 1000 MCG/ML IJ SOLN
1000.0000 ug | Freq: Once | INTRAMUSCULAR | Status: AC
  Administered 2021-12-25: 1000 ug via INTRAMUSCULAR

## 2021-12-25 NOTE — Progress Notes (Signed)
Patient here for monthly B12 injection per Dr. Jenny Reichmann. B12 1000 mcg given IM and patient tolerated injection well today.

## 2022-01-10 DIAGNOSIS — H5203 Hypermetropia, bilateral: Secondary | ICD-10-CM | POA: Diagnosis not present

## 2022-01-10 DIAGNOSIS — H2513 Age-related nuclear cataract, bilateral: Secondary | ICD-10-CM | POA: Diagnosis not present

## 2022-01-11 ENCOUNTER — Ambulatory Visit (INDEPENDENT_AMBULATORY_CARE_PROVIDER_SITE_OTHER): Payer: Medicare Other

## 2022-01-11 DIAGNOSIS — I442 Atrioventricular block, complete: Secondary | ICD-10-CM

## 2022-01-11 LAB — CUP PACEART REMOTE DEVICE CHECK
Battery Remaining Longevity: 44 mo
Battery Remaining Percentage: 54 %
Battery Voltage: 2.96 V
Brady Statistic AP VP Percent: 81 %
Brady Statistic AP VS Percent: 1 %
Brady Statistic AS VP Percent: 19 %
Brady Statistic AS VS Percent: 1 %
Brady Statistic RA Percent Paced: 80 %
Date Time Interrogation Session: 20230608032601
Implantable Lead Implant Date: 20050509
Implantable Lead Implant Date: 20050509
Implantable Lead Implant Date: 20200116
Implantable Lead Location: 753858
Implantable Lead Location: 753859
Implantable Lead Location: 753860
Implantable Lead Model: 1580
Implantable Pulse Generator Implant Date: 20200116
Lead Channel Impedance Value: 250 Ohm
Lead Channel Impedance Value: 360 Ohm
Lead Channel Impedance Value: 710 Ohm
Lead Channel Pacing Threshold Amplitude: 0.75 V
Lead Channel Pacing Threshold Amplitude: 1 V
Lead Channel Pacing Threshold Amplitude: 1 V
Lead Channel Pacing Threshold Pulse Width: 0.5 ms
Lead Channel Pacing Threshold Pulse Width: 0.5 ms
Lead Channel Pacing Threshold Pulse Width: 1 ms
Lead Channel Sensing Intrinsic Amplitude: 1.2 mV
Lead Channel Sensing Intrinsic Amplitude: 5.1 mV
Lead Channel Setting Pacing Amplitude: 1.75 V
Lead Channel Setting Pacing Amplitude: 2 V
Lead Channel Setting Pacing Amplitude: 2.5 V
Lead Channel Setting Pacing Pulse Width: 0.5 ms
Lead Channel Setting Pacing Pulse Width: 1 ms
Lead Channel Setting Sensing Sensitivity: 4 mV
Pulse Gen Model: 3562
Pulse Gen Serial Number: 9097773

## 2022-01-19 NOTE — Progress Notes (Signed)
Remote pacemaker transmission.   

## 2022-01-26 ENCOUNTER — Ambulatory Visit (INDEPENDENT_AMBULATORY_CARE_PROVIDER_SITE_OTHER): Payer: Medicare Other

## 2022-01-26 DIAGNOSIS — E538 Deficiency of other specified B group vitamins: Secondary | ICD-10-CM | POA: Diagnosis not present

## 2022-01-26 MED ORDER — CYANOCOBALAMIN 1000 MCG/ML IJ SOLN
1000.0000 ug | Freq: Once | INTRAMUSCULAR | Status: AC
  Administered 2022-01-26: 1000 ug via INTRAMUSCULAR

## 2022-01-29 ENCOUNTER — Encounter: Payer: Self-pay | Admitting: Internal Medicine

## 2022-01-29 ENCOUNTER — Ambulatory Visit (INDEPENDENT_AMBULATORY_CARE_PROVIDER_SITE_OTHER): Payer: Medicare Other | Admitting: Internal Medicine

## 2022-01-29 VITALS — BP 132/74 | HR 57 | Ht 71.0 in | Wt 168.0 lb

## 2022-01-29 DIAGNOSIS — I5022 Chronic systolic (congestive) heart failure: Secondary | ICD-10-CM | POA: Diagnosis not present

## 2022-01-29 DIAGNOSIS — I442 Atrioventricular block, complete: Secondary | ICD-10-CM | POA: Diagnosis not present

## 2022-01-29 DIAGNOSIS — I255 Ischemic cardiomyopathy: Secondary | ICD-10-CM | POA: Diagnosis not present

## 2022-01-30 ENCOUNTER — Ambulatory Visit (INDEPENDENT_AMBULATORY_CARE_PROVIDER_SITE_OTHER): Payer: Medicare Other | Admitting: Internal Medicine

## 2022-01-30 ENCOUNTER — Telehealth: Payer: Self-pay

## 2022-01-30 ENCOUNTER — Encounter: Payer: Self-pay | Admitting: Internal Medicine

## 2022-01-30 VITALS — BP 128/62 | HR 51 | Temp 98.7°F | Ht 71.0 in | Wt 166.0 lb

## 2022-01-30 DIAGNOSIS — E785 Hyperlipidemia, unspecified: Secondary | ICD-10-CM

## 2022-01-30 DIAGNOSIS — E538 Deficiency of other specified B group vitamins: Secondary | ICD-10-CM

## 2022-01-30 DIAGNOSIS — E559 Vitamin D deficiency, unspecified: Secondary | ICD-10-CM | POA: Diagnosis not present

## 2022-01-30 DIAGNOSIS — I10 Essential (primary) hypertension: Secondary | ICD-10-CM | POA: Diagnosis not present

## 2022-01-30 DIAGNOSIS — I255 Ischemic cardiomyopathy: Secondary | ICD-10-CM | POA: Diagnosis not present

## 2022-01-30 DIAGNOSIS — R739 Hyperglycemia, unspecified: Secondary | ICD-10-CM

## 2022-01-30 DIAGNOSIS — D509 Iron deficiency anemia, unspecified: Secondary | ICD-10-CM | POA: Diagnosis not present

## 2022-01-30 LAB — HEPATIC FUNCTION PANEL
ALT: 7 U/L (ref 0–53)
AST: 18 U/L (ref 0–37)
Albumin: 4.4 g/dL (ref 3.5–5.2)
Alkaline Phosphatase: 66 U/L (ref 39–117)
Bilirubin, Direct: 0.1 mg/dL (ref 0.0–0.3)
Total Bilirubin: 0.7 mg/dL (ref 0.2–1.2)
Total Protein: 6.7 g/dL (ref 6.0–8.3)

## 2022-01-30 LAB — CBC WITH DIFFERENTIAL/PLATELET
Basophils Absolute: 0 10*3/uL (ref 0.0–0.1)
Basophils Relative: 0.6 % (ref 0.0–3.0)
Eosinophils Absolute: 0.6 10*3/uL (ref 0.0–0.7)
Eosinophils Relative: 11.8 % — ABNORMAL HIGH (ref 0.0–5.0)
HCT: 26 % — ABNORMAL LOW (ref 39.0–52.0)
Hemoglobin: 9.2 g/dL — ABNORMAL LOW (ref 13.0–17.0)
Lymphocytes Relative: 18.1 % (ref 12.0–46.0)
Lymphs Abs: 0.9 10*3/uL (ref 0.7–4.0)
MCHC: 35.2 g/dL (ref 30.0–36.0)
MCV: 95 fl (ref 78.0–100.0)
Monocytes Absolute: 0.5 10*3/uL (ref 0.1–1.0)
Monocytes Relative: 11 % (ref 3.0–12.0)
Neutro Abs: 2.9 10*3/uL (ref 1.4–7.7)
Neutrophils Relative %: 58.5 % (ref 43.0–77.0)
Platelets: 235 10*3/uL (ref 150.0–400.0)
RBC: 2.74 Mil/uL — ABNORMAL LOW (ref 4.22–5.81)
RDW: 13.2 % (ref 11.5–15.5)
WBC: 4.9 10*3/uL (ref 4.0–10.5)

## 2022-01-30 LAB — URINALYSIS, ROUTINE W REFLEX MICROSCOPIC
Bilirubin Urine: NEGATIVE
Hgb urine dipstick: NEGATIVE
Ketones, ur: NEGATIVE
Leukocytes,Ua: NEGATIVE
Nitrite: NEGATIVE
Specific Gravity, Urine: 1.015 (ref 1.000–1.030)
Total Protein, Urine: NEGATIVE
Urine Glucose: NEGATIVE
Urobilinogen, UA: 0.2 (ref 0.0–1.0)
pH: 6 (ref 5.0–8.0)

## 2022-01-30 LAB — TSH: TSH: 2.76 u[IU]/mL (ref 0.35–5.50)

## 2022-01-30 LAB — FERRITIN: Ferritin: 96 ng/mL (ref 22.0–322.0)

## 2022-01-30 LAB — IBC PANEL
Iron: 97 ug/dL (ref 42–165)
Saturation Ratios: 24.2 % (ref 20.0–50.0)
TIBC: 400.4 ug/dL (ref 250.0–450.0)
Transferrin: 286 mg/dL (ref 212.0–360.0)

## 2022-01-30 LAB — VITAMIN B12: Vitamin B-12: 519 pg/mL (ref 211–911)

## 2022-01-30 LAB — BASIC METABOLIC PANEL
BUN: 25 mg/dL — ABNORMAL HIGH (ref 6–23)
CO2: 26 mEq/L (ref 19–32)
Calcium: 9.8 mg/dL (ref 8.4–10.5)
Chloride: 107 mEq/L (ref 96–112)
Creatinine, Ser: 1.61 mg/dL — ABNORMAL HIGH (ref 0.40–1.50)
GFR: 38.19 mL/min — ABNORMAL LOW (ref >60.00)
Glucose, Bld: 99 mg/dL (ref 70–99)
Potassium: 4.4 mEq/L (ref 3.5–5.1)
Sodium: 140 mEq/L (ref 135–145)

## 2022-01-30 LAB — LIPID PANEL
Cholesterol: 161 mg/dL (ref 0–200)
HDL: 38.7 mg/dL — ABNORMAL LOW (ref >39.00)
LDL Cholesterol: 96 mg/dL (ref 0–99)
NonHDL: 122.57
Total CHOL/HDL Ratio: 4
Triglycerides: 135 mg/dL (ref 0.0–149.0)
VLDL: 27 mg/dL (ref 0.0–40.0)

## 2022-01-30 LAB — VITAMIN D 25 HYDROXY (VIT D DEFICIENCY, FRACTURES): VITD: >120 ng/mL

## 2022-01-30 LAB — HEMOGLOBIN A1C: Hgb A1c MFr Bld: 5.7 % (ref 4.6–6.5)

## 2022-01-30 NOTE — Assessment & Plan Note (Signed)
Lab Results  Component Value Date   LDLCALC 77 10/13/2021   uncontrolled, pt to continue current statin zocor 40 mg and lopid 600 mg but declines zetia as he is convinced it leads to bilateral upper arm weakness; for f/u lipids today

## 2022-02-16 ENCOUNTER — Telehealth: Payer: Self-pay | Admitting: Internal Medicine

## 2022-02-16 NOTE — Telephone Encounter (Signed)
Caller & Relationship to patient: [name]  Call back number: [phone]  Date of last office visit: 01/30/22  Date of next office visit: 08/01/22  Medication(s) to be refilled:  clopidogrel (PLAVIX) 75 MG tablet  levothyroxine (SYNTHROID) 50 MCG tablet  metoprolol succinate (TOPROL-XL) 50 MG 24 hr tablet  ramipril (ALTACE) 10 MG capsule  simvastatin (ZOCOR) 40 MG tablet  Preferred Pharmacy:  [facility] ([place] [place], [place] Phone:  [phone]  Fax:  [phone]

## 2022-02-19 MED ORDER — CLOPIDOGREL BISULFATE 75 MG PO TABS
75.0000 mg | ORAL_TABLET | Freq: Every day | ORAL | 3 refills | Status: DC
Start: 2022-02-19 — End: 2023-02-12

## 2022-02-19 MED ORDER — RAMIPRIL 10 MG PO CAPS: 10.0000 mg | ORAL_CAPSULE | Freq: Every day | ORAL | 3 refills | Status: DC

## 2022-02-19 MED ORDER — LEVOTHYROXINE SODIUM 50 MCG PO TABS: 50.0000 ug | ORAL_TABLET | Freq: Every day | ORAL | 3 refills | Status: DC

## 2022-02-19 MED ORDER — METOPROLOL SUCCINATE ER 50 MG PO TB24: 50.0000 mg | ORAL_TABLET | Freq: Every day | ORAL | 3 refills | Status: DC

## 2022-02-19 MED ORDER — SIMVASTATIN 40 MG PO TABS
40.0000 mg | ORAL_TABLET | Freq: Every day | ORAL | 3 refills | Status: DC
Start: 2022-02-19 — End: 2023-02-12

## 2022-02-19 NOTE — Telephone Encounter (Signed)
Refills sent to pharmacy. 

## 2022-02-20 DIAGNOSIS — H2511 Age-related nuclear cataract, right eye: Secondary | ICD-10-CM | POA: Diagnosis not present

## 2022-02-20 DIAGNOSIS — H269 Unspecified cataract: Secondary | ICD-10-CM | POA: Diagnosis not present

## 2022-02-27 ENCOUNTER — Ambulatory Visit: Payer: Medicare Other | Admitting: Internal Medicine

## 2022-02-28 ENCOUNTER — Ambulatory Visit (INDEPENDENT_AMBULATORY_CARE_PROVIDER_SITE_OTHER): Payer: Medicare Other

## 2022-02-28 DIAGNOSIS — E538 Deficiency of other specified B group vitamins: Secondary | ICD-10-CM

## 2022-02-28 MED ORDER — CYANOCOBALAMIN 1000 MCG/ML IJ SOLN
1000.0000 ug | Freq: Once | INTRAMUSCULAR | Status: AC
  Administered 2022-02-28: 1000 ug via INTRAMUSCULAR

## 2022-02-28 NOTE — Progress Notes (Signed)
After obtaining consent, and per orders of Dr. John, injection of B12 given by Branden Shallenberger  Selman. Patient tolerated injection well in left deltoid, and instructed to report any adverse reaction to me immediately.  

## 2022-03-30 ENCOUNTER — Ambulatory Visit (INDEPENDENT_AMBULATORY_CARE_PROVIDER_SITE_OTHER): Payer: Medicare Other | Admitting: *Deleted

## 2022-03-30 DIAGNOSIS — E538 Deficiency of other specified B group vitamins: Secondary | ICD-10-CM

## 2022-03-30 MED ORDER — CYANOCOBALAMIN 1000 MCG/ML IJ SOLN
1000.0000 ug | Freq: Once | INTRAMUSCULAR | Status: AC
  Administered 2022-03-30: 1000 ug via INTRAMUSCULAR

## 2022-03-30 NOTE — Progress Notes (Signed)
Pls cosign for B12 inj../lmb  

## 2022-04-12 ENCOUNTER — Ambulatory Visit (INDEPENDENT_AMBULATORY_CARE_PROVIDER_SITE_OTHER): Payer: Medicare Other

## 2022-04-12 DIAGNOSIS — I442 Atrioventricular block, complete: Secondary | ICD-10-CM

## 2022-04-12 LAB — CUP PACEART REMOTE DEVICE CHECK
Battery Remaining Longevity: 40 mo
Battery Remaining Percentage: 51 %
Battery Voltage: 2.96 V
Brady Statistic AP VP Percent: 87 %
Brady Statistic AP VS Percent: 1 %
Brady Statistic AS VP Percent: 13 %
Brady Statistic AS VS Percent: 1 %
Brady Statistic RA Percent Paced: 87 %
Date Time Interrogation Session: 20230907020957
Implantable Lead Implant Date: 20050509
Implantable Lead Implant Date: 20050509
Implantable Lead Implant Date: 20200116
Implantable Lead Location: 753858
Implantable Lead Location: 753859
Implantable Lead Location: 753860
Implantable Lead Model: 1580
Implantable Pulse Generator Implant Date: 20200116
Lead Channel Impedance Value: 240 Ohm
Lead Channel Impedance Value: 360 Ohm
Lead Channel Impedance Value: 660 Ohm
Lead Channel Pacing Threshold Amplitude: 0.75 V
Lead Channel Pacing Threshold Amplitude: 1 V
Lead Channel Pacing Threshold Amplitude: 1 V
Lead Channel Pacing Threshold Pulse Width: 0.5 ms
Lead Channel Pacing Threshold Pulse Width: 0.5 ms
Lead Channel Pacing Threshold Pulse Width: 1 ms
Lead Channel Sensing Intrinsic Amplitude: 1 mV
Lead Channel Sensing Intrinsic Amplitude: 5.1 mV
Lead Channel Setting Pacing Amplitude: 1.75 V
Lead Channel Setting Pacing Amplitude: 2 V
Lead Channel Setting Pacing Amplitude: 2.5 V
Lead Channel Setting Pacing Pulse Width: 0.5 ms
Lead Channel Setting Pacing Pulse Width: 1 ms
Lead Channel Setting Sensing Sensitivity: 4 mV
Pulse Gen Model: 3562
Pulse Gen Serial Number: 9097773

## 2022-04-13 ENCOUNTER — Telehealth: Payer: Self-pay

## 2022-04-13 DIAGNOSIS — I2510 Atherosclerotic heart disease of native coronary artery without angina pectoris: Secondary | ICD-10-CM

## 2022-04-13 NOTE — Telephone Encounter (Signed)
Pharmacy is calling to request refill on: gemfibrozil (LOPID) 600 MG tablet  Pharmacy: [facility] ([place] [place], [place] 01/30/22 ROV 08/01/22

## 2022-04-23 MED ORDER — GEMFIBROZIL 600 MG PO TABS: ORAL_TABLET | ORAL | 3 refills | Status: DC

## 2022-04-23 NOTE — Telephone Encounter (Signed)
Rx request sent to pharmacy.  

## 2022-04-25 ENCOUNTER — Telehealth: Payer: Self-pay | Admitting: Internal Medicine

## 2022-04-25 NOTE — Telephone Encounter (Signed)
Called pt to schedule awv, declined awv. Please discuss with patient.

## 2022-04-28 NOTE — Progress Notes (Signed)
Remote pacemaker transmission.   

## 2022-05-02 ENCOUNTER — Ambulatory Visit (INDEPENDENT_AMBULATORY_CARE_PROVIDER_SITE_OTHER): Payer: Medicare Other

## 2022-05-02 DIAGNOSIS — E538 Deficiency of other specified B group vitamins: Secondary | ICD-10-CM

## 2022-05-02 DIAGNOSIS — Z23 Encounter for immunization: Secondary | ICD-10-CM | POA: Diagnosis not present

## 2022-05-02 MED ORDER — CYANOCOBALAMIN 1000 MCG/ML IJ SOLN
1000.0000 ug | Freq: Once | INTRAMUSCULAR | Status: AC
  Administered 2022-05-02: 1000 ug via INTRAMUSCULAR

## 2022-05-02 NOTE — Progress Notes (Signed)
After obtaining consent, and per orders of Dr. Jenny Reichmann, injection of B12 given in the left deltoid and High Flu shot given in the right deltoid by Marrian Salvage. Patient tolerated well and instructed to report any adverse reaction to me immediately.

## 2022-05-07 DIAGNOSIS — H4720 Unspecified optic atrophy: Secondary | ICD-10-CM | POA: Diagnosis not present

## 2022-05-07 DIAGNOSIS — Z961 Presence of intraocular lens: Secondary | ICD-10-CM | POA: Diagnosis not present

## 2022-05-07 DIAGNOSIS — H47013 Ischemic optic neuropathy, bilateral: Secondary | ICD-10-CM | POA: Diagnosis not present

## 2022-06-01 ENCOUNTER — Ambulatory Visit (INDEPENDENT_AMBULATORY_CARE_PROVIDER_SITE_OTHER): Payer: Medicare Other

## 2022-06-01 DIAGNOSIS — E538 Deficiency of other specified B group vitamins: Secondary | ICD-10-CM | POA: Diagnosis not present

## 2022-06-01 MED ORDER — CYANOCOBALAMIN 1000 MCG/ML IJ SOLN
1000.0000 ug | Freq: Once | INTRAMUSCULAR | Status: AC
  Administered 2022-06-01: 1000 ug via INTRAMUSCULAR

## 2022-06-01 NOTE — Progress Notes (Signed)
B12 given.  Pt tolerated well. Pt is aware to give the office a call for an side effects or reactions. Please co-sign.   

## 2022-06-12 DIAGNOSIS — Z8546 Personal history of malignant neoplasm of prostate: Secondary | ICD-10-CM | POA: Diagnosis not present

## 2022-06-19 DIAGNOSIS — Z8546 Personal history of malignant neoplasm of prostate: Secondary | ICD-10-CM | POA: Diagnosis not present

## 2022-07-02 ENCOUNTER — Ambulatory Visit (INDEPENDENT_AMBULATORY_CARE_PROVIDER_SITE_OTHER): Payer: Medicare Other

## 2022-07-02 DIAGNOSIS — E538 Deficiency of other specified B group vitamins: Secondary | ICD-10-CM

## 2022-07-02 MED ORDER — CYANOCOBALAMIN 1000 MCG/ML IJ SOLN
1000.0000 ug | Freq: Once | INTRAMUSCULAR | Status: AC
  Administered 2022-07-02: 1000 ug via INTRAMUSCULAR

## 2022-07-02 NOTE — Progress Notes (Signed)
After obtaining consent, and per orders of Dr. Jenny Reichmann, injection of B12 was given in the right deltoid by Marrian Salvage. Patient instructed to report any adverse reaction to me immediately.

## 2022-07-12 ENCOUNTER — Ambulatory Visit (INDEPENDENT_AMBULATORY_CARE_PROVIDER_SITE_OTHER): Payer: Medicare Other

## 2022-07-12 DIAGNOSIS — I442 Atrioventricular block, complete: Secondary | ICD-10-CM | POA: Diagnosis not present

## 2022-07-12 LAB — CUP PACEART REMOTE DEVICE CHECK
Battery Remaining Longevity: 38 mo
Battery Remaining Percentage: 47 %
Battery Voltage: 2.96 V
Brady Statistic AP VP Percent: 88 %
Brady Statistic AP VS Percent: 1 %
Brady Statistic AS VP Percent: 12 %
Brady Statistic AS VS Percent: 1 %
Brady Statistic RA Percent Paced: 88 %
Date Time Interrogation Session: 20231207035458
Implantable Lead Connection Status: 753985
Implantable Lead Connection Status: 753985
Implantable Lead Connection Status: 753985
Implantable Lead Implant Date: 20050509
Implantable Lead Implant Date: 20050509
Implantable Lead Implant Date: 20200116
Implantable Lead Location: 753858
Implantable Lead Location: 753859
Implantable Lead Location: 753860
Implantable Lead Model: 1580
Implantable Pulse Generator Implant Date: 20200116
Lead Channel Impedance Value: 280 Ohm
Lead Channel Impedance Value: 360 Ohm
Lead Channel Impedance Value: 690 Ohm
Lead Channel Pacing Threshold Amplitude: 0.75 V
Lead Channel Pacing Threshold Amplitude: 1 V
Lead Channel Pacing Threshold Amplitude: 1 V
Lead Channel Pacing Threshold Pulse Width: 0.5 ms
Lead Channel Pacing Threshold Pulse Width: 0.5 ms
Lead Channel Pacing Threshold Pulse Width: 1 ms
Lead Channel Sensing Intrinsic Amplitude: 1.7 mV
Lead Channel Sensing Intrinsic Amplitude: 5.1 mV
Lead Channel Setting Pacing Amplitude: 1.75 V
Lead Channel Setting Pacing Amplitude: 2 V
Lead Channel Setting Pacing Amplitude: 2.5 V
Lead Channel Setting Pacing Pulse Width: 0.5 ms
Lead Channel Setting Pacing Pulse Width: 1 ms
Lead Channel Setting Sensing Sensitivity: 4 mV
Pulse Gen Model: 3562
Pulse Gen Serial Number: 9097773

## 2022-07-27 ENCOUNTER — Ambulatory Visit: Payer: Medicare Other | Attending: Cardiovascular Disease | Admitting: Cardiovascular Disease

## 2022-07-27 ENCOUNTER — Encounter: Payer: Self-pay | Admitting: Cardiovascular Disease

## 2022-07-27 VITALS — BP 152/70 | HR 57 | Ht 71.0 in | Wt 169.0 lb

## 2022-07-27 DIAGNOSIS — I10 Essential (primary) hypertension: Secondary | ICD-10-CM | POA: Diagnosis not present

## 2022-07-27 DIAGNOSIS — I5022 Chronic systolic (congestive) heart failure: Secondary | ICD-10-CM | POA: Diagnosis not present

## 2022-07-27 DIAGNOSIS — I25118 Atherosclerotic heart disease of native coronary artery with other forms of angina pectoris: Secondary | ICD-10-CM | POA: Diagnosis not present

## 2022-07-27 DIAGNOSIS — E7800 Pure hypercholesterolemia, unspecified: Secondary | ICD-10-CM | POA: Diagnosis not present

## 2022-07-27 DIAGNOSIS — I442 Atrioventricular block, complete: Secondary | ICD-10-CM | POA: Insufficient documentation

## 2022-07-27 DIAGNOSIS — I255 Ischemic cardiomyopathy: Secondary | ICD-10-CM | POA: Diagnosis not present

## 2022-07-27 NOTE — Progress Notes (Signed)
Chief Complaint  Patient presents with   Follow-up    CAD   History of Present Illness: 88 yo male with history of complete heart block, CAD s/p stenting of the LAD in 2005 and 2008, ischemic cardiomyopathy s/p BiV ICD (implanted 2005/generator change 2011 and upgrade to BiV ICD in 2020), HLD, HTN and chronic systolic CHF here today for cardiac follow up. His ICD is followed by Dr. [name]. Echo July 2016 with LVEF=40-45%, mild AI, trivial MR. Diagnosis of prostate cancer in 2014. He was seen in October 2019 and he c/o jaw pain and tooth pain with dyspnea with moderate exertion. Nuclear stress test November  2019 with medium sized inferior, inferoseptal and apical defect with scar and possible soft tissue attenuation but no ischemia. LVEF noted to be 35% with hypokinesis of the inferior, inferoseptal and apical walls. Echo September 2020 with LVEF=50-55%, mild MR.   He is here today for follow up. The patient denies any chest pain, dyspnea, palpitations, lower extremity edema, orthopnea, PND, dizziness, near syncope or syncope.     Primary Care Physician: Biagio Borg, MD  Past Medical History:  Diagnosis Date   AICD (automatic cardioverter/defibrillator) present    "turned it off today, put in an extra lead,  and made it just a pacemaker" (08/21/2018)   Anemia, unspecified    Aortic stenosis    Mild, echo, October, 2011   Arthritis    "joints; arms"  (08/21/2018)   AV block, complete ([place])    Permanent pacemaker   CAD (coronary artery disease)    DES to LAD 2005 /  DES to LAD 2008   Cardiomyopathy    Ischemic, EF 30%, 2005   Chronic bronchitis (HCC)    DJD (degenerative joint disease)    Dyslipidemia    Ejection fraction < 50%    EF 30%, echo, 2005  /  EF 30%, catheterization, 2008   GERD (gastroesophageal reflux disease)    Hyperlipidemia    Hypertension    Hypothyroidism    ICD (implantable cardiac defibrillator), single, in situ    for syncope and arrhythmia 2005 / generator  change June, 2011   Iron deficiency anemia 06/17/2018   LBBB (left bundle branch block)    Mitral regurgitation    Mild, echo, October, 2011   Myocardial infarction [facility] 1989   Presence of permanent cardiac pacemaker    Prostate cancer [facility])    Rectal fissure    Syncope    2005, ICD placed   Systolic CHF, chronic ([place])    Vitamin B12 deficiency     Past Surgical History:  Procedure Laterality Date   BIV UPGRADE  08/21/2018   BIV UPGRADE N/A 08/21/2018   Procedure: BIV UPGRADE;  Surgeon: Evans Lance, MD;  Location: [facility];  Service: Cardiovascular;  Laterality: N/A;   CARDIAC CATHETERIZATION  2008   ICD IMPLANT  2005   INSERTION PROSTATE RADIATION SEED     LAPAROSCOPIC CHOLECYSTECTOMY  04/2005   Dr. Lucia Gaskins   PROSTATE BIOPSY     RECTAL DILATATION     "stretched it twice" (08/21/2018)   SHOULDER OPEN ROTATOR CUFF REPAIR Right     Current Outpatient Medications  Medication Sig Dispense Refill   acetaminophen (TYLENOL) 650 MG CR tablet Take 1,300 mg by mouth 2 (two) times daily.     CALCIUM PO Take 2 tablets by mouth daily.      Cholecalciferol (VITAMIN D) 50 MCG (2000 UT) CAPS Take 2,000 Units by  mouth daily.     clopidogrel (PLAVIX) 75 MG tablet Take 1 tablet (75 mg total) by mouth daily. 90 tablet 3   fish oil-omega-3 fatty acids 1000 MG capsule Take 1 g by mouth 2 (two) times daily.     gemfibrozil (LOPID) 600 MG tablet TAKE 1 TABLET BY MOUTH DAILY. GENERIC EQUIVALENT FOR LOPID 90 tablet 3   levothyroxine (SYNTHROID) 50 MCG tablet Take 1 tablet (50 mcg total) by mouth daily before breakfast. 90 tablet 3   metoprolol succinate (TOPROL-XL) 50 MG 24 hr tablet Take 1 tablet (50 mg total) by mouth daily. Take with or immediately following a meal. 90 tablet 3   Multiple Vitamins-Minerals (MENS MULTIVITAMIN PLUS PO) Take 1 tablet by mouth daily.     nystatin cream (MYCOSTATIN) Apply topically as directed.     omeprazole (PRILOSEC) 20 MG capsule Take 1 capsule (20 mg  total) by mouth daily. 90 capsule 3   polyethylene glycol (MIRALAX / GLYCOLAX) packet Take 17 g by mouth See admin instructions. Take 17g mixed with liquid once daily, may do a second dose as needed for constipation     ramipril (ALTACE) 10 MG capsule Take 1 capsule (10 mg total) by mouth daily. 90 capsule 3   simvastatin (ZOCOR) 40 MG tablet Take 1 tablet (40 mg total) by mouth at bedtime. 90 tablet 3   No current facility-administered medications for this visit.    Allergies  Allergen Reactions   Pantoprazole Other (See Comments)    B12 levels dropped on medication   Zetia [Ezetimibe] Other (See Comments)    Arm weakness    Social History   Socioeconomic History   Marital status: Widowed    Spouse name: Not on file   Number of children: 2   Years of education: Not on file   Highest education level: Not on file  Occupational History   Occupation: repairs looms at [employer]  Tobacco Use   Smoking status: Former    Packs/day: 1.00    Years: 2.00    Total pack years: 2.00    Types: Cigarettes    Quit date: 08/06/1952    Years since quitting: 70.0   Smokeless tobacco: Never  Vaping Use   Vaping Use: Never used  Substance and Sexual Activity   Alcohol use: Never   Drug use: Never   Sexual activity: Not Currently  Other Topics Concern   Not on file  Social History Narrative   Not on file   Social Determinants of Health   Financial Resource Strain: Not on file  Food Insecurity: Not on file  Transportation Needs: Not on file  Physical Activity: Not on file  Stress: Not on file  Social Connections: Not on file  Intimate Partner Violence: Not on file    Family History  Problem Relation Age of Onset   Stroke Mother    Cancer Mother 30       unknown   Heart attack Father 40   Heart Problems Father    Heart attack Brother    Lung cancer Brother    Stroke Brother    Heart Problems Brother        CABG   Lung cancer Sister    Other Sister        PACEMAKER    Breast cancer Maternal Aunt     Review of Systems:  As stated in the HPI and otherwise negative.   BP (!) 152/70   Pulse (!) 57   Ht  $'5\' 11"'Y$  (1.803 m)   Wt 169 lb (76.7 kg)   SpO2 98%   BMI 23.57 kg/m   Physical Examination: General: Well developed, well nourished, NAD  HEENT: OP clear, mucus membranes moist  SKIN: warm, dry. No rashes. Neuro: No focal deficits  Musculoskeletal: Muscle strength 5/5 all ext  Psychiatric: Mood and affect normal  Neck: No JVD, no carotid bruits, no thyromegaly, no lymphadenopathy.  Lungs:Clear bilaterally, no wheezes, rhonci, crackles Cardiovascular: Regular rate and rhythm. Soft systolic murmur.  Abdomen:Soft. Bowel sounds present. Non-tender.  Extremities: No lower extremity edema. Pulses are 2 + in the bilateral DP/PT.  Echo September 2020:  1. Left ventricular ejection fraction, by visual estimation, is 50 to  55%. The left ventricle has normal function. There is no left ventricular  hypertrophy.   2. Left ventricular diastolic Doppler parameters are indeterminate  pattern of LV diastolic filling.   3. Global longitudinal strain= =17%.   4. Global right ventricle has normal systolic function.The right  ventricular size is normal. No increase in right ventricular wall  thickness.   5. Mild mitral valve regurgitation.   6. The tricuspid valve is normal in structure. Tricuspid valve  regurgitation is trivial.   7. The aortic valve is tricuspid Aortic valve regurgitation is trivial by  color flow Doppler. Mild to moderate aortic valve sclerosis/calcification  without any evidence of aortic stenosis.   EKG:  EKG is not ordered today. The ekg ordered today demonstrates   Recent Labs: 11/27/2021: B Natriuretic Peptide 138.9 01/30/2022: ALT 7; BUN 25; Creatinine, Ser 1.61; Hemoglobin 9.2; Platelets 235.0; Potassium 4.4; Sodium 140; TSH 2.76   Lipid Panel    Component Value Date/Time   CHOL 161 01/30/2022 0832   CHOL 144 10/13/2021 0851    TRIG 135.0 01/30/2022 0832   HDL 38.70 (L) 01/30/2022 0832   HDL 45 10/13/2021 0851   CHOLHDL 4 01/30/2022 0832   VLDL 27.0 01/30/2022 0832   LDLCALC 96 01/30/2022 0832   LDLCALC 77 10/13/2021 0851   LDLDIRECT 96.3 02/26/2011 0735     Wt Readings from Last 3 Encounters:  07/27/22 169 lb (76.7 kg)  01/30/22 166 lb (75.3 kg)  01/29/22 168 lb (76.2 kg)    Assessment and Plan:   1. CAD with angina: He has no chest pain. Nuclear stress test in 2019 with no ischemia. Continue Plavix, statin and beta blocker.   2. Ischemic cardiomyopathy/Chronic systolic CHF: Echo September 2020 with LVEF=50-55%. BiV ICD is in place. No volume overload on exam. Continue beta blocker and Ace-inh.   3. HTN: BP is well controlled at home. No changes today  4. HLD: His lipids are followed in primary care. LDL goal under 55. Last LDL 100 in June 2022. He is on Zocor 40 mg daily. He does not wish to change statins or increase the Zocor. We tried Zetia but he did not tolerate this due to muscle aches.   5. Complete heart block: BiV ICD in place. Followed in EP/Pacer clinic.   6. Aortic valve disease: Trivial AI by echo September 2020.   Labs/ tests ordered today include:  No orders of the defined types were placed in this encounter.  Disposition:   F/U with me in 12 months  Signed, Lauree Chandler, MD 07/27/2022 9:56 AM    [facility] [address], [place], [place]  [address] Phone: [phone]; Fax: [phone]

## 2022-08-01 ENCOUNTER — Ambulatory Visit: Payer: Medicare Other | Admitting: Internal Medicine

## 2022-08-03 NOTE — Progress Notes (Signed)
Remote pacemaker transmission.   

## 2022-08-08 ENCOUNTER — Other Ambulatory Visit: Payer: Self-pay | Admitting: Internal Medicine

## 2022-08-08 ENCOUNTER — Encounter: Payer: Self-pay | Admitting: Internal Medicine

## 2022-08-08 ENCOUNTER — Ambulatory Visit (INDEPENDENT_AMBULATORY_CARE_PROVIDER_SITE_OTHER): Payer: Medicare Other | Admitting: Internal Medicine

## 2022-08-08 ENCOUNTER — Telehealth: Payer: Self-pay

## 2022-08-08 VITALS — BP 136/70 | HR 68 | Temp 97.5°F | Ht 71.0 in | Wt 170.0 lb

## 2022-08-08 DIAGNOSIS — E559 Vitamin D deficiency, unspecified: Secondary | ICD-10-CM | POA: Diagnosis not present

## 2022-08-08 DIAGNOSIS — E785 Hyperlipidemia, unspecified: Secondary | ICD-10-CM | POA: Diagnosis not present

## 2022-08-08 DIAGNOSIS — I10 Essential (primary) hypertension: Secondary | ICD-10-CM

## 2022-08-08 DIAGNOSIS — E875 Hyperkalemia: Secondary | ICD-10-CM

## 2022-08-08 DIAGNOSIS — E538 Deficiency of other specified B group vitamins: Secondary | ICD-10-CM

## 2022-08-08 DIAGNOSIS — D509 Iron deficiency anemia, unspecified: Secondary | ICD-10-CM | POA: Diagnosis not present

## 2022-08-08 DIAGNOSIS — D649 Anemia, unspecified: Secondary | ICD-10-CM

## 2022-08-08 DIAGNOSIS — R739 Hyperglycemia, unspecified: Secondary | ICD-10-CM | POA: Diagnosis not present

## 2022-08-08 DIAGNOSIS — N1831 Chronic kidney disease, stage 3a: Secondary | ICD-10-CM

## 2022-08-08 DIAGNOSIS — N183 Chronic kidney disease, stage 3 unspecified: Secondary | ICD-10-CM | POA: Insufficient documentation

## 2022-08-08 LAB — BASIC METABOLIC PANEL
BUN: 28 mg/dL — ABNORMAL HIGH (ref 6–23)
CO2: 25 mEq/L (ref 19–32)
Calcium: 10.2 mg/dL (ref 8.4–10.5)
Chloride: 106 mEq/L (ref 96–112)
Creatinine, Ser: 1.78 mg/dL — ABNORMAL HIGH (ref 0.40–1.50)
GFR: 33.73 mL/min — ABNORMAL LOW (ref >60.00)
Glucose, Bld: 98 mg/dL (ref 70–99)
Potassium: 5.6 mEq/L — ABNORMAL HIGH (ref 3.5–5.1)
Sodium: 139 mEq/L (ref 135–145)

## 2022-08-08 LAB — HEPATIC FUNCTION PANEL
ALT: 11 U/L (ref 0–53)
AST: 22 U/L (ref 0–37)
Albumin: 4.7 g/dL (ref 3.5–5.2)
Alkaline Phosphatase: 54 U/L (ref 39–117)
Bilirubin, Direct: 0.2 mg/dL (ref 0.0–0.3)
Total Bilirubin: 0.7 mg/dL (ref 0.2–1.2)
Total Protein: 7.2 g/dL (ref 6.0–8.3)

## 2022-08-08 LAB — CBC WITH DIFFERENTIAL/PLATELET
Basophils Absolute: 0 10*3/uL (ref 0.0–0.1)
Basophils Relative: 0.5 % (ref 0.0–3.0)
Eosinophils Absolute: 0.4 10*3/uL (ref 0.0–0.7)
Eosinophils Relative: 7.5 % — ABNORMAL HIGH (ref 0.0–5.0)
HCT: 26.4 % — ABNORMAL LOW (ref 39.0–52.0)
Hemoglobin: 9.3 g/dL — ABNORMAL LOW (ref 13.0–17.0)
Lymphocytes Relative: 21.6 % (ref 12.0–46.0)
Lymphs Abs: 1.2 10*3/uL (ref 0.7–4.0)
MCHC: 35.2 g/dL (ref 30.0–36.0)
MCV: 96.1 fl (ref 78.0–100.0)
Monocytes Absolute: 0.5 10*3/uL (ref 0.1–1.0)
Monocytes Relative: 9.6 % (ref 3.0–12.0)
Neutro Abs: 3.4 10*3/uL (ref 1.4–7.7)
Neutrophils Relative %: 60.8 % (ref 43.0–77.0)
Platelets: 256 10*3/uL (ref 150.0–400.0)
RBC: 2.75 Mil/uL — ABNORMAL LOW (ref 4.22–5.81)
RDW: 12.4 % (ref 11.5–15.5)
WBC: 5.6 10*3/uL (ref 4.0–10.5)

## 2022-08-08 LAB — LIPID PANEL
Cholesterol: 188 mg/dL (ref 0–200)
HDL: 45.4 mg/dL (ref >39.00)
NonHDL: 142.11
Total CHOL/HDL Ratio: 4
Triglycerides: 206 mg/dL — ABNORMAL HIGH (ref 0.0–149.0)
VLDL: 41.2 mg/dL — ABNORMAL HIGH (ref 0.0–40.0)

## 2022-08-08 LAB — HEMOGLOBIN A1C: Hgb A1c MFr Bld: 5.7 % (ref 4.6–6.5)

## 2022-08-08 LAB — LDL CHOLESTEROL, DIRECT: Direct LDL: 95 mg/dL

## 2022-08-08 LAB — VITAMIN D 25 HYDROXY (VIT D DEFICIENCY, FRACTURES): VITD: 103.77 ng/mL (ref 30.00–100.00)

## 2022-08-08 LAB — VITAMIN B12: Vitamin B-12: >1500 pg/mL — ABNORMAL HIGH (ref 211–911)

## 2022-08-08 MED ORDER — CYANOCOBALAMIN 1000 MCG/ML IJ SOLN
1000.0000 ug | Freq: Once | INTRAMUSCULAR | Status: AC
  Administered 2022-08-08: 1000 ug via INTRAMUSCULAR

## 2022-08-08 NOTE — Telephone Encounter (Signed)
CRITICAL VALUE STICKER  CRITICAL VALUE: Vitamin D level 107.11  RECEIVER (on-site recipient of call): Shirlee Limerick  DATE & TIME NOTIFIED: 08/08/22 @ 11:33 am    MESSENGER (representative from lab): Saa  MD NOTIFIED: yes   TIME OF NOTIFICATION: 11:43 am  RESPONSE: ok

## 2022-08-08 NOTE — Patient Instructions (Addendum)
Please have your Shingrix (shingles) shots done at your local pharmacy. ' Please continue all other medications as before, and refills have been done if requested.  Please have the pharmacy call with any other refills you may need.  Please continue your efforts at being more active, low cholesterol diet, and weight control.  You are otherwise up to date with prevention measures today.  Please keep your appointments with your specialists as you may have planned  Please go to the LAB at the blood drawing area for the tests to be done  You will be contacted by phone if any changes need to be made immediately.  Otherwise, you will receive a letter about your results with an explanation, but please check with MyChart first.  Please remember to sign up for MyChart if you have not done so, as this will be important to you in the future with finding out test results, communicating by private email, and scheduling acute appointments online when needed.  Please make an Appointment to return for your 1 year visit, or sooner if needed

## 2022-08-08 NOTE — Progress Notes (Signed)
Patient ID: [name], male   DOB: 03/05/1932, 89 y.o.   MRN: [id number]         Chief Complaint:: yearly exam f/u anemia, ckd, hyperglycemia, iron def anemia, low b12       HPI:  [name] is a 89 y.o. male here for above, Pt denies chest pain, increased sob or doe, wheezing, orthopnea, PND, increased LE swelling, palpitations, dizziness or syncope.   Pt denies polydipsia, polyuria, or new focal neuro s/s.    Pt denies fever, wt loss, night sweats, loss of appetite, or other constitutional symptoms   Due for shingrix at the pharmacy.  No recent overt bleeding, bruising.      Wt Readings from Last 3 Encounters:  08/08/22 170 lb (77.1 kg)  07/27/22 169 lb (76.7 kg)  01/30/22 166 lb (75.3 kg)   BP Readings from Last 3 Encounters:  08/08/22 136/70  07/27/22 (!) 152/70  01/30/22 128/62   Immunization History  Administered Date(s) Administered   Fluad Quad(high Dose 65+) 04/30/2019, 05/05/2020, 05/02/2022   H1N1 08/04/2008   Influenza Split 05/10/2012, 05/11/2013   Influenza Whole 05/04/2008, 06/21/2009, 05/08/2010   Influenza, High Dose Seasonal PF 05/01/2017, 05/05/2018   Influenza,inj,Quad PF,6+ Mos 04/09/2014, 05/07/2016   Influenza,inj,quad, With Preservative 06/06/2017   Influenza-Unspecified 05/07/2015   Moderna Sars-Covid-2 Vaccination 08/07/2019, 09/07/2019, 09/19/2020   Pneumococcal Conjugate-13 09/09/2014   Pneumococcal Polysaccharide-23 05/17/2000, 08/30/2009   Tdap 01/30/2019   Health Maintenance Due  Topic Date Due   Medicare Annual Wellness (AWV)  Never done   Zoster Vaccines- Shingrix (1 of 2) Never done      Past Medical History:  Diagnosis Date   AICD (automatic cardioverter/defibrillator) present    "turned it off today, put in an extra lead,  and made it just a pacemaker" (08/21/2018)   Anemia, unspecified    Aortic stenosis    Mild, echo, October, 2011   Arthritis    "joints; arms"  (08/21/2018)   AV block, complete ([place])    Permanent  pacemaker   CAD (coronary artery disease)    DES to LAD 2005 /  DES to LAD 2008   Cardiomyopathy    Ischemic, EF 30%, 2005   Chronic bronchitis (HCC)    DJD (degenerative joint disease)    Dyslipidemia    Ejection fraction < 50%    EF 30%, echo, 2005  /  EF 30%, catheterization, 2008   GERD (gastroesophageal reflux disease)    Hyperlipidemia    Hypertension    Hypothyroidism    ICD (implantable cardiac defibrillator), single, in situ    for syncope and arrhythmia 2005 / generator change June, 2011   Iron deficiency anemia 06/17/2018   LBBB (left bundle branch block)    Mitral regurgitation    Mild, echo, October, 2011   Myocardial infarction [facility]) 1989   Presence of permanent cardiac pacemaker    Prostate cancer [facility])    Rectal fissure    Syncope    2005, ICD placed   Systolic CHF, chronic ([place])    Vitamin B12 deficiency    Past Surgical History:  Procedure Laterality Date   BIV UPGRADE  08/21/2018   BIV UPGRADE N/A 08/21/2018   Procedure: BIV UPGRADE;  Surgeon: Evans Lance, MD;  Location: [facility];  Service: Cardiovascular;  Laterality: N/A;   CARDIAC CATHETERIZATION  2008   ICD IMPLANT  2005   INSERTION PROSTATE RADIATION SEED     LAPAROSCOPIC CHOLECYSTECTOMY  04/2005  Dr. Lucia Gaskins   PROSTATE BIOPSY     RECTAL DILATATION     "stretched it twice" (08/21/2018)   SHOULDER OPEN ROTATOR CUFF REPAIR Right     reports that he quit smoking about 70 years ago. His smoking use included cigarettes. He has a 2.00 pack-year smoking history. He has never used smokeless tobacco. He reports that he does not drink alcohol and does not use drugs. family history includes Breast cancer in his maternal aunt; Cancer (age of onset: 16) in his mother; Heart Problems in his brother and father; Heart attack in his brother; Heart attack (age of onset: 57) in his father; Lung cancer in his brother and sister; Other in his sister; Stroke in his brother and mother. Allergies  Allergen  Reactions   Pantoprazole Other (See Comments)    B12 levels dropped on medication   Zetia [Ezetimibe] Other (See Comments)    Arm weakness   Current Outpatient Medications on File Prior to Visit  Medication Sig Dispense Refill   acetaminophen (TYLENOL) 650 MG CR tablet Take 1,300 mg by mouth 2 (two) times daily.     CALCIUM PO Take 2 tablets by mouth daily.      Cholecalciferol (VITAMIN D) 50 MCG (2000 UT) CAPS Take 2,000 Units by mouth daily.     clopidogrel (PLAVIX) 75 MG tablet Take 1 tablet (75 mg total) by mouth daily. 90 tablet 3   fish oil-omega-3 fatty acids 1000 MG capsule Take 1 g by mouth 2 (two) times daily.     gemfibrozil (LOPID) 600 MG tablet TAKE 1 TABLET BY MOUTH DAILY. GENERIC EQUIVALENT FOR LOPID 90 tablet 3   levothyroxine (SYNTHROID) 50 MCG tablet Take 1 tablet (50 mcg total) by mouth daily before breakfast. 90 tablet 3   metoprolol succinate (TOPROL-XL) 50 MG 24 hr tablet Take 1 tablet (50 mg total) by mouth daily. Take with or immediately following a meal. 90 tablet 3   Multiple Vitamins-Minerals (MENS MULTIVITAMIN PLUS PO) Take 1 tablet by mouth daily.     nystatin cream (MYCOSTATIN) Apply topically as directed.     omeprazole (PRILOSEC) 20 MG capsule Take 1 capsule (20 mg total) by mouth daily. 90 capsule 3   polyethylene glycol (MIRALAX / GLYCOLAX) packet Take 17 g by mouth See admin instructions. Take 17g mixed with liquid once daily, may do a second dose as needed for constipation     simvastatin (ZOCOR) 40 MG tablet Take 1 tablet (40 mg total) by mouth at bedtime. 90 tablet 3   No current facility-administered medications on file prior to visit.        ROS:  All others reviewed and negative.  Objective        PE:  BP 136/70   Pulse 68   Temp (!) 97.5 F (36.4 C) (Oral)   Ht '5\' 11"'$  (1.803 m)   Wt 170 lb (77.1 kg)   SpO2 94%   BMI 23.71 kg/m                 Constitutional: Pt appears in NAD               HENT: Head: NCAT.                Right Ear:  External ear normal.                 Left Ear: External ear normal.  Eyes: . Pupils are equal, round, and reactive to light. Conjunctivae and EOM are normal               Nose: without d/c or deformity               Neck: Neck supple. Gross normal ROM               Cardiovascular: Normal rate and regular rhythm.                 Pulmonary/Chest: Effort normal and breath sounds without rales or wheezing.                Abd:  Soft, NT, ND, + BS, no organomegaly               Neurological: Pt is alert. At baseline orientation, motor grossly intact               Skin: Skin is warm. No rashes, no other new lesions, LE edema - none               Psychiatric: Pt behavior is normal without agitation   Micro: none  Cardiac tracings I have personally interpreted today:  none  Pertinent Radiological findings (summarize): none   Lab Results  Component Value Date   WBC 5.6 08/08/2022   HGB 9.3 (L) 08/08/2022   HCT 26.4 (L) 08/08/2022   PLT 256.0 08/08/2022   GLUCOSE 99 08/09/2022   CHOL 188 08/08/2022   TRIG 206.0 (H) 08/08/2022   HDL 45.40 08/08/2022   LDLDIRECT 95.0 08/08/2022   LDLCALC 96 01/30/2022   ALT 11 08/08/2022   AST 22 08/08/2022   NA 139 08/09/2022   K 5.6 No hemolysis seen (H) 08/09/2022   CL 106 08/09/2022   CREATININE 1.73 (H) 08/09/2022   BUN 27 (H) 08/09/2022   CO2 26 08/09/2022   TSH 2.76 01/30/2022   PSA 0.00 (L) 01/24/2021   INR 0.95 01/06/2010   HGBA1C 5.7 08/08/2022   Assessment/Plan:  [name] is a 89 y.o. White or Caucasian [1] male with  has a past medical history of AICD (automatic cardioverter/defibrillator) present, Anemia, unspecified, Aortic stenosis, Arthritis, AV block, complete ([place]), CAD (coronary artery disease), Cardiomyopathy, Chronic bronchitis ([place]), DJD (degenerative joint disease), Dyslipidemia, Ejection fraction < 50%, GERD (gastroesophageal reflux disease), Hyperlipidemia, Hypertension, Hypothyroidism, ICD (implantable  cardiac defibrillator), single, in situ, Iron deficiency anemia (06/17/2018), LBBB (left bundle branch block), Mitral regurgitation, Myocardial infarction ([place]) (1989), Presence of permanent cardiac pacemaker, Prostate cancer ([place]), Rectal fissure, Syncope, Systolic CHF, chronic ([place]), and Vitamin B12 deficiency.  Anemia Uncontrolled, ? Renal related, for f/u cbc and renal lab today  Lab Results  Component Value Date   WBC 5.6 08/08/2022   HGB 9.3 (L) 08/08/2022   HCT 26.4 (L) 08/08/2022   MCV 96.1 08/08/2022   PLT 256.0 08/08/2022    CKD (chronic kidney disease) stage 3, GFR 30-59 ml/min (HCC) Lab Results  Component Value Date   CREATININE 1.73 (H) 08/09/2022   Stable overall, cont to avoid nephrotoxins   Dyslipidemia Lab Results  Component Value Date   LDLCALC 96 01/30/2022   Stable, pt to continue current statin zocor 40 mg qd   Hyperglycemia Lab Results  Component Value Date   HGBA1C 5.7 08/08/2022   Stable, pt to continue current medical treatment  - diet, wt control, excercise   Hypertension BP Readings from Last 3 Encounters:  08/08/22 136/70  07/27/22 (!) 152/70  01/30/22 128/62   Stable, pt to continue medical treatment topro xl 50 mg qd, norvasc 2.5 qd   Iron deficiency anemia Also for f/u iron level,  to f/u any worsening symptoms or concerns  Vitamin B 12 deficiency Lab Results  Component Value Date   VITAMINB12 >1500 (H) 08/08/2022   Stable, cont oral replacement - b12 1000 mcg qd  Followup: Return in about 6 months (around 02/06/2023).  Cathlean Cower, MD 08/11/2022 8:14 PM [facility]

## 2022-08-09 ENCOUNTER — Other Ambulatory Visit: Payer: Self-pay | Admitting: Internal Medicine

## 2022-08-09 ENCOUNTER — Other Ambulatory Visit (INDEPENDENT_AMBULATORY_CARE_PROVIDER_SITE_OTHER): Payer: Medicare Other

## 2022-08-09 ENCOUNTER — Encounter: Payer: Self-pay | Admitting: Internal Medicine

## 2022-08-09 DIAGNOSIS — E875 Hyperkalemia: Secondary | ICD-10-CM

## 2022-08-09 LAB — BASIC METABOLIC PANEL
BUN: 27 mg/dL — ABNORMAL HIGH (ref 6–23)
CO2: 26 mEq/L (ref 19–32)
Calcium: 10.1 mg/dL (ref 8.4–10.5)
Chloride: 106 mEq/L (ref 96–112)
Creatinine, Ser: 1.73 mg/dL — ABNORMAL HIGH (ref 0.40–1.50)
GFR: 34.9 mL/min — ABNORMAL LOW (ref >60.00)
Glucose, Bld: 99 mg/dL (ref 70–99)
Potassium: 5.6 mEq/L — ABNORMAL HIGH (ref 3.5–5.1)
Sodium: 139 mEq/L (ref 135–145)

## 2022-08-09 MED ORDER — AMLODIPINE BESYLATE 2.5 MG PO TABS: 2.5000 mg | ORAL_TABLET | Freq: Every day | ORAL | 3 refills | Status: DC

## 2022-08-11 ENCOUNTER — Encounter: Payer: Self-pay | Admitting: Internal Medicine

## 2022-08-11 NOTE — Assessment & Plan Note (Signed)
Lab Results  Component Value Date   HGBA1C 5.7 08/08/2022   Stable, pt to continue current medical treatment  - diet, wt control, excercise

## 2022-08-11 NOTE — Assessment & Plan Note (Addendum)
Uncontrolled, ? Renal related, for f/u cbc and renal lab today  Lab Results  Component Value Date   WBC 5.6 08/08/2022   HGB 9.3 (L) 08/08/2022   HCT 26.4 (L) 08/08/2022   MCV 96.1 08/08/2022   PLT 256.0 08/08/2022

## 2022-08-11 NOTE — Assessment & Plan Note (Signed)
Lab Results  Component Value Date   CREATININE 1.73 (H) 08/09/2022   Stable overall, cont to avoid nephrotoxins

## 2022-08-11 NOTE — Assessment & Plan Note (Signed)
Lab Results  Component Value Date   LDLCALC 96 01/30/2022   Stable, pt to continue current statin zocor 40 mg qd

## 2022-08-11 NOTE — Assessment & Plan Note (Signed)
Lab Results  Component Value Date   VITAMINB12 >1500 (H) 08/08/2022   Stable, cont oral replacement - b12 1000 mcg qd

## 2022-08-11 NOTE — Assessment & Plan Note (Signed)
Also for f/u iron level,  to f/u any worsening symptoms or concerns

## 2022-08-11 NOTE — Assessment & Plan Note (Signed)
BP Readings from Last 3 Encounters:  08/08/22 136/70  07/27/22 (!) 152/70  01/30/22 128/62   Stable, pt to continue medical treatment topro xl 50 mg qd, norvasc 2.5 qd

## 2022-08-16 ENCOUNTER — Other Ambulatory Visit: Payer: Self-pay

## 2022-08-16 DIAGNOSIS — I2510 Atherosclerotic heart disease of native coronary artery without angina pectoris: Secondary | ICD-10-CM

## 2022-08-16 MED ORDER — OMEPRAZOLE 20 MG PO CPDR: 20.0000 mg | DELAYED_RELEASE_CAPSULE | Freq: Every day | ORAL | 3 refills | Status: DC

## 2022-09-05 DIAGNOSIS — H47013 Ischemic optic neuropathy, bilateral: Secondary | ICD-10-CM | POA: Diagnosis not present

## 2022-09-05 DIAGNOSIS — Z961 Presence of intraocular lens: Secondary | ICD-10-CM | POA: Diagnosis not present

## 2022-09-05 DIAGNOSIS — H4720 Unspecified optic atrophy: Secondary | ICD-10-CM | POA: Diagnosis not present

## 2022-09-10 ENCOUNTER — Ambulatory Visit (INDEPENDENT_AMBULATORY_CARE_PROVIDER_SITE_OTHER): Payer: Medicare Other

## 2022-09-10 DIAGNOSIS — E538 Deficiency of other specified B group vitamins: Secondary | ICD-10-CM

## 2022-09-10 MED ORDER — CYANOCOBALAMIN 1000 MCG/ML IJ SOLN
1000.0000 ug | Freq: Once | INTRAMUSCULAR | Status: AC
  Administered 2022-09-10: 1000 ug via INTRAMUSCULAR

## 2022-09-10 NOTE — Progress Notes (Signed)
After obtaining consent, and per orders of Dr. John, injection of B12 given by Tatia Petrucci P Taijon Vink. Patient instructed to report any adverse reaction to me immediately.  

## 2022-09-17 ENCOUNTER — Ambulatory Visit: Payer: Medicare Other | Admitting: Cardiovascular Disease

## 2022-10-10 ENCOUNTER — Ambulatory Visit (INDEPENDENT_AMBULATORY_CARE_PROVIDER_SITE_OTHER): Payer: Medicare Other

## 2022-10-10 DIAGNOSIS — E538 Deficiency of other specified B group vitamins: Secondary | ICD-10-CM | POA: Diagnosis not present

## 2022-10-10 MED ORDER — CYANOCOBALAMIN 1000 MCG/ML IJ SOLN
1000.0000 ug | Freq: Once | INTRAMUSCULAR | Status: AC
  Administered 2022-10-10: 1000 ug via INTRAMUSCULAR

## 2022-10-10 NOTE — Progress Notes (Signed)
Pt was given B12 w/o any complications. 

## 2022-10-11 ENCOUNTER — Ambulatory Visit: Payer: Medicare Other

## 2022-10-11 DIAGNOSIS — I255 Ischemic cardiomyopathy: Secondary | ICD-10-CM

## 2022-10-11 LAB — CUP PACEART REMOTE DEVICE CHECK
Battery Remaining Longevity: 36 mo
Battery Remaining Percentage: 43 %
Battery Voltage: 2.95 V
Brady Statistic AP VP Percent: 89 %
Brady Statistic AP VS Percent: 1 %
Brady Statistic AS VP Percent: 11 %
Brady Statistic AS VS Percent: 1 %
Brady Statistic RA Percent Paced: 88 %
Date Time Interrogation Session: 20240307023904
Implantable Lead Connection Status: 753985
Implantable Lead Connection Status: 753985
Implantable Lead Connection Status: 753985
Implantable Lead Implant Date: 20050509
Implantable Lead Implant Date: 20050509
Implantable Lead Implant Date: 20200116
Implantable Lead Location: 753858
Implantable Lead Location: 753859
Implantable Lead Location: 753860
Implantable Lead Model: 1580
Implantable Pulse Generator Implant Date: 20200116
Lead Channel Impedance Value: 280 Ohm
Lead Channel Impedance Value: 360 Ohm
Lead Channel Impedance Value: 780 Ohm
Lead Channel Pacing Threshold Amplitude: 0.75 V
Lead Channel Pacing Threshold Amplitude: 1 V
Lead Channel Pacing Threshold Amplitude: 1 V
Lead Channel Pacing Threshold Pulse Width: 0.5 ms
Lead Channel Pacing Threshold Pulse Width: 0.5 ms
Lead Channel Pacing Threshold Pulse Width: 1 ms
Lead Channel Sensing Intrinsic Amplitude: 1.1 mV
Lead Channel Sensing Intrinsic Amplitude: 5.1 mV
Lead Channel Setting Pacing Amplitude: 1.75 V
Lead Channel Setting Pacing Amplitude: 2 V
Lead Channel Setting Pacing Amplitude: 2.5 V
Lead Channel Setting Pacing Pulse Width: 0.5 ms
Lead Channel Setting Pacing Pulse Width: 1 ms
Lead Channel Setting Sensing Sensitivity: 4 mV
Pulse Gen Model: 3562
Pulse Gen Serial Number: 9097773

## 2022-10-12 ENCOUNTER — Telehealth: Payer: Self-pay | Admitting: Internal Medicine

## 2022-10-12 MED ORDER — HYDROCODONE BIT-HOMATROP MBR 5-1.5 MG/5ML PO SOLN: 5.0000 mL | Freq: Four times a day (QID) | ORAL | 0 refills | Status: AC | PRN

## 2022-10-12 MED ORDER — NIRMATRELVIR/RITONAVIR (PAXLOVID) TABLET (RENAL DOSING): 2.0000 | ORAL_TABLET | Freq: Two times a day (BID) | ORAL | 0 refills | Status: DC

## 2022-10-12 NOTE — Telephone Encounter (Signed)
Patient tested positive for covid 10/12/22 and would like to know if he can be prescribed Paxlovid. He has minor coughing and a fever of 100.5. He would like it to be sent to [facility] on [address] in [place]. His daughter is the one who called and said to call her back if anything is needed at [phone].

## 2022-10-12 NOTE — Telephone Encounter (Signed)
Please advise 

## 2022-10-12 NOTE — Telephone Encounter (Signed)
Ok done erx 

## 2022-10-15 ENCOUNTER — Encounter (HOSPITAL_BASED_OUTPATIENT_CLINIC_OR_DEPARTMENT_OTHER): Payer: Self-pay | Admitting: Emergency Medicine

## 2022-10-15 ENCOUNTER — Other Ambulatory Visit: Payer: Self-pay

## 2022-10-15 ENCOUNTER — Emergency Department (HOSPITAL_BASED_OUTPATIENT_CLINIC_OR_DEPARTMENT_OTHER): Payer: Medicare Other

## 2022-10-15 ENCOUNTER — Emergency Department (HOSPITAL_BASED_OUTPATIENT_CLINIC_OR_DEPARTMENT_OTHER)
Admission: EM | Admit: 2022-10-15 | Discharge: 2022-10-15 | Disposition: A | Discharge status: Home / Self Care | Payer: Medicare Other | Source: Home / Self Care | Attending: Emergency Medicine | Admitting: Emergency Medicine

## 2022-10-15 DIAGNOSIS — D649 Anemia, unspecified: Secondary | ICD-10-CM | POA: Insufficient documentation

## 2022-10-15 DIAGNOSIS — J1282 Pneumonia due to coronavirus disease 2019: Secondary | ICD-10-CM | POA: Diagnosis not present

## 2022-10-15 DIAGNOSIS — Z79899 Other long term (current) drug therapy: Secondary | ICD-10-CM | POA: Insufficient documentation

## 2022-10-15 DIAGNOSIS — Z87891 Personal history of nicotine dependence: Secondary | ICD-10-CM | POA: Diagnosis not present

## 2022-10-15 DIAGNOSIS — R0602 Shortness of breath: Secondary | ICD-10-CM | POA: Diagnosis not present

## 2022-10-15 DIAGNOSIS — Z95810 Presence of automatic (implantable) cardiac defibrillator: Secondary | ICD-10-CM | POA: Diagnosis not present

## 2022-10-15 DIAGNOSIS — I9589 Other hypotension: Secondary | ICD-10-CM | POA: Diagnosis present

## 2022-10-15 DIAGNOSIS — R42 Dizziness and giddiness: Secondary | ICD-10-CM | POA: Diagnosis not present

## 2022-10-15 DIAGNOSIS — J9601 Acute respiratory failure with hypoxia: Secondary | ICD-10-CM | POA: Diagnosis not present

## 2022-10-15 DIAGNOSIS — Z8616 Personal history of COVID-19: Secondary | ICD-10-CM | POA: Diagnosis not present

## 2022-10-15 DIAGNOSIS — R0902 Hypoxemia: Secondary | ICD-10-CM | POA: Diagnosis not present

## 2022-10-15 DIAGNOSIS — I2489 Other forms of acute ischemic heart disease: Secondary | ICD-10-CM | POA: Diagnosis present

## 2022-10-15 DIAGNOSIS — E538 Deficiency of other specified B group vitamins: Secondary | ICD-10-CM | POA: Diagnosis present

## 2022-10-15 DIAGNOSIS — I959 Hypotension, unspecified: Secondary | ICD-10-CM | POA: Diagnosis not present

## 2022-10-15 DIAGNOSIS — Z7902 Long term (current) use of antithrombotics/antiplatelets: Secondary | ICD-10-CM | POA: Insufficient documentation

## 2022-10-15 DIAGNOSIS — K219 Gastro-esophageal reflux disease without esophagitis: Secondary | ICD-10-CM | POA: Diagnosis present

## 2022-10-15 DIAGNOSIS — I6381 Other cerebral infarction due to occlusion or stenosis of small artery: Secondary | ICD-10-CM | POA: Diagnosis not present

## 2022-10-15 DIAGNOSIS — I2510 Atherosclerotic heart disease of native coronary artery without angina pectoris: Secondary | ICD-10-CM | POA: Diagnosis present

## 2022-10-15 DIAGNOSIS — R059 Cough, unspecified: Secondary | ICD-10-CM | POA: Diagnosis not present

## 2022-10-15 DIAGNOSIS — Z79890 Hormone replacement therapy (postmenopausal): Secondary | ICD-10-CM | POA: Diagnosis not present

## 2022-10-15 DIAGNOSIS — M7989 Other specified soft tissue disorders: Secondary | ICD-10-CM | POA: Diagnosis not present

## 2022-10-15 DIAGNOSIS — N1832 Chronic kidney disease, stage 3b: Secondary | ICD-10-CM | POA: Diagnosis present

## 2022-10-15 DIAGNOSIS — Z8249 Family history of ischemic heart disease and other diseases of the circulatory system: Secondary | ICD-10-CM | POA: Diagnosis not present

## 2022-10-15 DIAGNOSIS — N179 Acute kidney failure, unspecified: Secondary | ICD-10-CM | POA: Diagnosis present

## 2022-10-15 DIAGNOSIS — R911 Solitary pulmonary nodule: Secondary | ICD-10-CM | POA: Diagnosis not present

## 2022-10-15 DIAGNOSIS — U071 COVID-19: Secondary | ICD-10-CM | POA: Diagnosis not present

## 2022-10-15 DIAGNOSIS — R7989 Other specified abnormal findings of blood chemistry: Secondary | ICD-10-CM | POA: Diagnosis not present

## 2022-10-15 DIAGNOSIS — N189 Chronic kidney disease, unspecified: Secondary | ICD-10-CM | POA: Insufficient documentation

## 2022-10-15 DIAGNOSIS — E785 Hyperlipidemia, unspecified: Secondary | ICD-10-CM | POA: Diagnosis not present

## 2022-10-15 DIAGNOSIS — I5042 Chronic combined systolic (congestive) and diastolic (congestive) heart failure: Secondary | ICD-10-CM | POA: Diagnosis present

## 2022-10-15 DIAGNOSIS — I5032 Chronic diastolic (congestive) heart failure: Secondary | ICD-10-CM | POA: Diagnosis not present

## 2022-10-15 DIAGNOSIS — R918 Other nonspecific abnormal finding of lung field: Secondary | ICD-10-CM | POA: Diagnosis not present

## 2022-10-15 DIAGNOSIS — E039 Hypothyroidism, unspecified: Secondary | ICD-10-CM | POA: Diagnosis not present

## 2022-10-15 DIAGNOSIS — I130 Hypertensive heart and chronic kidney disease with heart failure and stage 1 through stage 4 chronic kidney disease, or unspecified chronic kidney disease: Secondary | ICD-10-CM | POA: Diagnosis present

## 2022-10-15 DIAGNOSIS — I429 Cardiomyopathy, unspecified: Secondary | ICD-10-CM | POA: Diagnosis present

## 2022-10-15 DIAGNOSIS — I442 Atrioventricular block, complete: Secondary | ICD-10-CM | POA: Diagnosis not present

## 2022-10-15 DIAGNOSIS — R531 Weakness: Secondary | ICD-10-CM | POA: Diagnosis not present

## 2022-10-15 LAB — CBC WITH DIFFERENTIAL/PLATELET
Abs Immature Granulocytes: 0.02 10*3/uL (ref 0.00–0.07)
Basophils Absolute: 0 10*3/uL (ref 0.0–0.1)
Basophils Relative: 0 %
Eosinophils Absolute: 0.4 10*3/uL (ref 0.0–0.5)
Eosinophils Relative: 6 %
HCT: 21.2 % — ABNORMAL LOW (ref 39.0–52.0)
Hemoglobin: 7.4 g/dL — ABNORMAL LOW (ref 13.0–17.0)
Immature Granulocytes: 0 %
Lymphocytes Relative: 14 %
Lymphs Abs: 0.8 10*3/uL (ref 0.7–4.0)
MCH: 33.2 pg (ref 26.0–34.0)
MCHC: 34.9 g/dL (ref 30.0–36.0)
MCV: 95.1 fL (ref 80.0–100.0)
Monocytes Absolute: 0.8 10*3/uL (ref 0.1–1.0)
Monocytes Relative: 15 %
Neutro Abs: 3.5 10*3/uL (ref 1.7–7.7)
Neutrophils Relative %: 65 %
Platelets: 167 10*3/uL (ref 150–400)
RBC: 2.23 MIL/uL — ABNORMAL LOW (ref 4.22–5.81)
RDW: 12.8 % (ref 11.5–15.5)
WBC: 5.5 10*3/uL (ref 4.0–10.5)
nRBC: 0 % (ref 0.0–0.2)

## 2022-10-15 LAB — BASIC METABOLIC PANEL
Anion gap: 11 (ref 5–15)
BUN: 35 mg/dL — ABNORMAL HIGH (ref 8–23)
CO2: 21 mmol/L — ABNORMAL LOW (ref 22–32)
Calcium: 9.4 mg/dL (ref 8.9–10.3)
Chloride: 104 mmol/L (ref 98–111)
Creatinine, Ser: 2.21 mg/dL — ABNORMAL HIGH (ref 0.61–1.24)
GFR, Estimated: 28 mL/min — ABNORMAL LOW (ref >60)
Glucose, Bld: 110 mg/dL — ABNORMAL HIGH (ref 70–99)
Potassium: 4.5 mmol/L (ref 3.5–5.1)
Sodium: 136 mmol/L (ref 135–145)

## 2022-10-15 NOTE — ED Triage Notes (Signed)
Pt from home, +covid and cough for 2 days, also reporting he noticed dizziness while standing. Denies any cp or sob

## 2022-10-15 NOTE — ED Notes (Signed)
XR at bedside

## 2022-10-15 NOTE — ED Notes (Signed)
Reviewed AVS with patient, patient expressed understanding of directions, denies further questions at this time. 

## 2022-10-15 NOTE — ED Provider Notes (Signed)
[facility]  Provider Note  CSN: [id number] Arrival date & time: 10/15/22 0101  History Chief Complaint  Patient presents with   Cough   Covid+    [name] is a 89 y.o. male brought to the ED by daughter. He was exposed to Covid last week, began having symptoms and tested positive at home about 4-5 days ago. Called his PCP 2 days ago and had Paxlovid called in, but they did not know it had been prescribed and did not go to pick it up. He has continued to have a cough. He called his daughter tonight saying he was feeling dizzy. When she got to his house, he was febrile. Temp has improved en route to the ED and he reports he is feeling better now.    Home Medications Prior to Admission medications   Medication Sig Start Date End Date Taking? Authorizing Provider  acetaminophen (TYLENOL) 650 MG CR tablet Take 1,300 mg by mouth 2 (two) times daily.    [provider]  amLODipine (NORVASC) 2.5 MG tablet Take 1 tablet (2.5 mg total) by mouth daily. 08/09/22   Biagio Borg, MD  CALCIUM PO Take 2 tablets by mouth daily.     [provider]  Cholecalciferol (VITAMIN D) 50 MCG (2000 UT) CAPS Take 2,000 Units by mouth daily.    [provider]  clopidogrel (PLAVIX) 75 MG tablet Take 1 tablet (75 mg total) by mouth daily. 02/19/22   Biagio Borg, MD  fish oil-omega-3 fatty acids 1000 MG capsule Take 1 g by mouth 2 (two) times daily.    [provider]  gemfibrozil (LOPID) 600 MG tablet TAKE 1 TABLET BY MOUTH DAILY. GENERIC EQUIVALENT FOR LOPID 04/23/22   Biagio Borg, MD  HYDROcodone bit-homatropine [facility]) 5-1.5 MG/5ML syrup Take 5 mLs by mouth every 6 (six) hours as needed for up to 10 days. 10/12/22 10/22/22  Biagio Borg, MD  levothyroxine (SYNTHROID) 50 MCG tablet Take 1 tablet (50 mcg total) by mouth daily before breakfast. 02/19/22   Biagio Borg, MD  metoprolol succinate (TOPROL-XL) 50 MG 24 hr tablet  Take 1 tablet (50 mg total) by mouth daily. Take with or immediately following a meal. 02/19/22   Biagio Borg, MD  Multiple Vitamins-Minerals (MENS MULTIVITAMIN PLUS PO) Take 1 tablet by mouth daily.    [provider]  nystatin cream (MYCOSTATIN) Apply topically as directed. 12/12/20   [provider]  omeprazole (PRILOSEC) 20 MG capsule Take 1 capsule (20 mg total) by mouth daily. 08/16/22   Biagio Borg, MD  polyethylene glycol [facility]) packet Take 17 g by mouth See admin instructions. Take 17g mixed with liquid once daily, may do a second dose as needed for constipation    [provider]  simvastatin (ZOCOR) 40 MG tablet Take 1 tablet (40 mg total) by mouth at bedtime. 02/19/22   Biagio Borg, MD     Allergies    Pantoprazole and Zetia [ezetimibe]   Review of Systems   Review of Systems Please see HPI for pertinent positives and negatives  Physical Exam BP 107/68   Pulse (!) 49   Temp 98.5 F (36.9 C) (Oral)   Resp 18   Wt 73.9 kg   SpO2 92%   BMI 22.73 kg/m   Physical Exam Vitals and nursing note reviewed.  Constitutional:      Appearance: Normal appearance.  HENT:  Head: Normocephalic and atraumatic.     Nose: Nose normal.     Mouth/Throat:     Mouth: Mucous membranes are moist.  Eyes:     Extraocular Movements: Extraocular movements intact.     Conjunctiva/sclera: Conjunctivae normal.  Cardiovascular:     Rate and Rhythm: Normal rate.  Pulmonary:     Effort: Pulmonary effort is normal.     Breath sounds: Normal breath sounds. No wheezing, rhonchi or rales.  Abdominal:     General: Abdomen is flat.     Palpations: Abdomen is soft.     Tenderness: There is no abdominal tenderness.  Musculoskeletal:        General: No swelling. Normal range of motion.     Cervical back: Neck supple.  Skin:    General: Skin is warm and dry.  Neurological:     General: No focal deficit present.     Mental Status: He is alert.   Psychiatric:        Mood and Affect: Mood normal.     ED Results / Procedures / Treatments   EKG None  Procedures Procedures  Medications Ordered in the ED Medications - No data to display  Initial Impression and Plan  Patient with positive home Covid test, here with cough, dizziness and fever at home. Vitals reassuring here. He was prescribed Paxlovid 2 days ago after a phone call to PCP but that message was not communicated to the patient or family so he has not picked it up yet. Given his comorbidities, will check labs and CXR and reassess.   ED Course   Clinical Course as of 10/15/22 0235  Mon Oct 15, 2022  0148 CBC with normal WBC, he is anemic, a bit worse than baseline but not in need of emergent transfusion.  [CS]  0202 I personally viewed the images from radiology studies and agree with radiologist interpretation: CXR with some increased interstitial markings. Possibly due to Covid infection [CS]  0213 BMP with CKD mildly worsened from baseline 1.7. Now GFR is <30. Recommend he NOT take the Paxlovid.  [CS]  0220 Patient remains asymptomatic and well appearing without hypoxia. He is comfortable going home. Recommend he maintain his hydration, APAP for fevers, follow up with PCP in a few days to recheck his labs and RTED for any worsening symptoms.  [CS]    Clinical Course User Index [CS] Truddie Hidden, MD     MDM Rules/Calculators/A&P Medical Decision Making Problems Addressed: Chronic anemia: chronic illness or injury with exacerbation, progression, or side effects of treatment Chronic kidney disease, unspecified CKD stage: chronic illness or injury with exacerbation, progression, or side effects of treatment COVID-19: acute illness or injury  Amount and/or Complexity of Data Reviewed Labs: ordered. Decision-making details documented in ED Course. Radiology: ordered and independent interpretation performed. Decision-making details documented in ED  Course.  Risk Prescription drug management.     Final Clinical Impression(s) / ED Diagnoses Final diagnoses:  COVID-19  Chronic kidney disease, unspecified CKD stage  Chronic anemia    Rx / DC Orders ED Discharge Orders     None        Truddie Hidden, MD 10/15/22 [phone]

## 2022-10-16 ENCOUNTER — Inpatient Hospital Stay (HOSPITAL_COMMUNITY)
Admission: EM | Admit: 2022-10-16 | Discharge: 2022-10-19 | DRG: 643 | Disposition: A | Discharge status: Home Health | Payer: Medicare Other | Attending: Internal Medicine | Admitting: Internal Medicine

## 2022-10-16 ENCOUNTER — Emergency Department (HOSPITAL_COMMUNITY): Payer: Medicare Other

## 2022-10-16 ENCOUNTER — Ambulatory Visit: Payer: Medicare Other | Admitting: Internal Medicine

## 2022-10-16 ENCOUNTER — Telehealth: Payer: Self-pay

## 2022-10-16 ENCOUNTER — Other Ambulatory Visit: Payer: Self-pay

## 2022-10-16 ENCOUNTER — Encounter (HOSPITAL_COMMUNITY): Payer: Self-pay

## 2022-10-16 DIAGNOSIS — I5042 Chronic combined systolic (congestive) and diastolic (congestive) heart failure: Secondary | ICD-10-CM | POA: Diagnosis present

## 2022-10-16 DIAGNOSIS — Z801 Family history of malignant neoplasm of trachea, bronchus and lung: Secondary | ICD-10-CM

## 2022-10-16 DIAGNOSIS — H9190 Unspecified hearing loss, unspecified ear: Secondary | ICD-10-CM | POA: Diagnosis present

## 2022-10-16 DIAGNOSIS — I130 Hypertensive heart and chronic kidney disease with heart failure and stage 1 through stage 4 chronic kidney disease, or unspecified chronic kidney disease: Secondary | ICD-10-CM | POA: Diagnosis present

## 2022-10-16 DIAGNOSIS — R918 Other nonspecific abnormal finding of lung field: Secondary | ICD-10-CM | POA: Diagnosis not present

## 2022-10-16 DIAGNOSIS — I442 Atrioventricular block, complete: Secondary | ICD-10-CM

## 2022-10-16 DIAGNOSIS — Z79899 Other long term (current) drug therapy: Secondary | ICD-10-CM | POA: Diagnosis not present

## 2022-10-16 DIAGNOSIS — Z79890 Hormone replacement therapy (postmenopausal): Secondary | ICD-10-CM

## 2022-10-16 DIAGNOSIS — Z823 Family history of stroke: Secondary | ICD-10-CM

## 2022-10-16 DIAGNOSIS — Z95810 Presence of automatic (implantable) cardiac defibrillator: Secondary | ICD-10-CM | POA: Diagnosis not present

## 2022-10-16 DIAGNOSIS — Z8546 Personal history of malignant neoplasm of prostate: Secondary | ICD-10-CM

## 2022-10-16 DIAGNOSIS — Z955 Presence of coronary angioplasty implant and graft: Secondary | ICD-10-CM

## 2022-10-16 DIAGNOSIS — R911 Solitary pulmonary nodule: Secondary | ICD-10-CM

## 2022-10-16 DIAGNOSIS — N179 Acute kidney failure, unspecified: Secondary | ICD-10-CM | POA: Diagnosis present

## 2022-10-16 DIAGNOSIS — I9589 Other hypotension: Secondary | ICD-10-CM | POA: Diagnosis present

## 2022-10-16 DIAGNOSIS — N1832 Chronic kidney disease, stage 3b: Secondary | ICD-10-CM | POA: Diagnosis present

## 2022-10-16 DIAGNOSIS — E039 Hypothyroidism, unspecified: Principal | ICD-10-CM

## 2022-10-16 DIAGNOSIS — I959 Hypotension, unspecified: Secondary | ICD-10-CM | POA: Diagnosis not present

## 2022-10-16 DIAGNOSIS — J9601 Acute respiratory failure with hypoxia: Secondary | ICD-10-CM

## 2022-10-16 DIAGNOSIS — R0602 Shortness of breath: Secondary | ICD-10-CM | POA: Diagnosis not present

## 2022-10-16 DIAGNOSIS — K219 Gastro-esophageal reflux disease without esophagitis: Secondary | ICD-10-CM | POA: Diagnosis present

## 2022-10-16 DIAGNOSIS — Z87891 Personal history of nicotine dependence: Secondary | ICD-10-CM | POA: Diagnosis not present

## 2022-10-16 DIAGNOSIS — I5032 Chronic diastolic (congestive) heart failure: Secondary | ICD-10-CM | POA: Diagnosis not present

## 2022-10-16 DIAGNOSIS — D649 Anemia, unspecified: Secondary | ICD-10-CM

## 2022-10-16 DIAGNOSIS — U071 COVID-19: Secondary | ICD-10-CM | POA: Diagnosis present

## 2022-10-16 DIAGNOSIS — I5030 Unspecified diastolic (congestive) heart failure: Secondary | ICD-10-CM | POA: Diagnosis present

## 2022-10-16 DIAGNOSIS — R7989 Other specified abnormal findings of blood chemistry: Secondary | ICD-10-CM | POA: Diagnosis not present

## 2022-10-16 DIAGNOSIS — E538 Deficiency of other specified B group vitamins: Secondary | ICD-10-CM | POA: Diagnosis present

## 2022-10-16 DIAGNOSIS — Z8249 Family history of ischemic heart disease and other diseases of the circulatory system: Secondary | ICD-10-CM

## 2022-10-16 DIAGNOSIS — Z803 Family history of malignant neoplasm of breast: Secondary | ICD-10-CM

## 2022-10-16 DIAGNOSIS — Z8616 Personal history of COVID-19: Secondary | ICD-10-CM | POA: Diagnosis not present

## 2022-10-16 DIAGNOSIS — Z7902 Long term (current) use of antithrombotics/antiplatelets: Secondary | ICD-10-CM

## 2022-10-16 DIAGNOSIS — I429 Cardiomyopathy, unspecified: Secondary | ICD-10-CM | POA: Diagnosis present

## 2022-10-16 DIAGNOSIS — R42 Dizziness and giddiness: Secondary | ICD-10-CM | POA: Diagnosis present

## 2022-10-16 DIAGNOSIS — I252 Old myocardial infarction: Secondary | ICD-10-CM

## 2022-10-16 DIAGNOSIS — I2510 Atherosclerotic heart disease of native coronary artery without angina pectoris: Secondary | ICD-10-CM | POA: Diagnosis present

## 2022-10-16 DIAGNOSIS — E785 Hyperlipidemia, unspecified: Secondary | ICD-10-CM

## 2022-10-16 DIAGNOSIS — R531 Weakness: Secondary | ICD-10-CM | POA: Diagnosis not present

## 2022-10-16 DIAGNOSIS — J1282 Pneumonia due to coronavirus disease 2019: Secondary | ICD-10-CM | POA: Diagnosis present

## 2022-10-16 DIAGNOSIS — R0902 Hypoxemia: Secondary | ICD-10-CM | POA: Diagnosis not present

## 2022-10-16 DIAGNOSIS — I6381 Other cerebral infarction due to occlusion or stenosis of small artery: Secondary | ICD-10-CM | POA: Diagnosis not present

## 2022-10-16 DIAGNOSIS — M7989 Other specified soft tissue disorders: Secondary | ICD-10-CM | POA: Diagnosis not present

## 2022-10-16 DIAGNOSIS — J189 Pneumonia, unspecified organism: Secondary | ICD-10-CM

## 2022-10-16 DIAGNOSIS — I2489 Other forms of acute ischemic heart disease: Secondary | ICD-10-CM | POA: Diagnosis present

## 2022-10-16 DIAGNOSIS — Z9049 Acquired absence of other specified parts of digestive tract: Secondary | ICD-10-CM

## 2022-10-16 LAB — CBC WITH DIFFERENTIAL/PLATELET
Abs Immature Granulocytes: 0.02 10*3/uL (ref 0.00–0.07)
Basophils Absolute: 0 10*3/uL (ref 0.0–0.1)
Basophils Relative: 0 %
Eosinophils Absolute: 0.2 10*3/uL (ref 0.0–0.5)
Eosinophils Relative: 3 %
HCT: 20.3 % — ABNORMAL LOW (ref 39.0–52.0)
Hemoglobin: 7 g/dL — ABNORMAL LOW (ref 13.0–17.0)
Immature Granulocytes: 0 %
Lymphocytes Relative: 13 %
Lymphs Abs: 0.8 10*3/uL (ref 0.7–4.0)
MCH: 33.8 pg (ref 26.0–34.0)
MCHC: 34.5 g/dL (ref 30.0–36.0)
MCV: 98.1 fL (ref 80.0–100.0)
Monocytes Absolute: 0.6 10*3/uL (ref 0.1–1.0)
Monocytes Relative: 10 %
Neutro Abs: 4.7 10*3/uL (ref 1.7–7.7)
Neutrophils Relative %: 74 %
Platelets: 179 10*3/uL (ref 150–400)
RBC: 2.07 MIL/uL — ABNORMAL LOW (ref 4.22–5.81)
RDW: 12.5 % (ref 11.5–15.5)
WBC: 6.3 10*3/uL (ref 4.0–10.5)
nRBC: 0 % (ref 0.0–0.2)

## 2022-10-16 LAB — COMPREHENSIVE METABOLIC PANEL
ALT: 10 U/L (ref 0–44)
AST: 24 U/L (ref 15–41)
Albumin: 3.5 g/dL (ref 3.5–5.0)
Alkaline Phosphatase: 48 U/L (ref 38–126)
Anion gap: 13 (ref 5–15)
BUN: 34 mg/dL — ABNORMAL HIGH (ref 8–23)
CO2: 19 mmol/L — ABNORMAL LOW (ref 22–32)
Calcium: 9 mg/dL (ref 8.9–10.3)
Chloride: 105 mmol/L (ref 98–111)
Creatinine, Ser: 2.36 mg/dL — ABNORMAL HIGH (ref 0.61–1.24)
GFR, Estimated: 26 mL/min — ABNORMAL LOW (ref >60)
Glucose, Bld: 104 mg/dL — ABNORMAL HIGH (ref 70–99)
Potassium: 4.4 mmol/L (ref 3.5–5.1)
Sodium: 137 mmol/L (ref 135–145)
Total Bilirubin: 1 mg/dL (ref 0.3–1.2)
Total Protein: 6.5 g/dL (ref 6.5–8.1)

## 2022-10-16 LAB — MAGNESIUM: Magnesium: 2.1 mg/dL (ref 1.7–2.4)

## 2022-10-16 LAB — LACTIC ACID, PLASMA
Lactic Acid, Venous: 0.7 mmol/L (ref 0.5–1.9)
Lactic Acid, Venous: 0.9 mmol/L (ref 0.5–1.9)

## 2022-10-16 LAB — POC OCCULT BLOOD, ED: Fecal Occult Bld: NEGATIVE

## 2022-10-16 LAB — BRAIN NATRIURETIC PEPTIDE: B Natriuretic Peptide: 404.2 pg/mL — ABNORMAL HIGH (ref 0.0–100.0)

## 2022-10-16 LAB — TROPONIN I (HIGH SENSITIVITY)
Troponin I (High Sensitivity): 18 ng/L — ABNORMAL HIGH (ref <18)
Troponin I (High Sensitivity): 20 ng/L — ABNORMAL HIGH (ref <18)

## 2022-10-16 LAB — ABO/RH: ABO/RH(D): A POS

## 2022-10-16 LAB — PREPARE RBC (CROSSMATCH)

## 2022-10-16 LAB — PROCALCITONIN: Procalcitonin: 0.3 ng/mL

## 2022-10-16 LAB — TSH: TSH: 1.023 u[IU]/mL (ref 0.350–4.500)

## 2022-10-16 MED ORDER — SODIUM CHLORIDE 0.9% IV SOLUTION: Freq: Once | INTRAVENOUS | Status: AC

## 2022-10-16 MED ORDER — ACETAMINOPHEN 650 MG RE SUPP: 650.0000 mg | Freq: Four times a day (QID) | RECTAL | Status: DC | PRN

## 2022-10-16 MED ORDER — GEMFIBROZIL 600 MG PO TABS
600.0000 mg | ORAL_TABLET | Freq: Every day | ORAL | Status: DC
  Administered 2022-10-17 – 2022-10-19 (×3): 600 mg via ORAL
  Filled 2022-10-16 (×3): qty 1

## 2022-10-16 MED ORDER — ONDANSETRON HCL 4 MG/2ML IJ SOLN: 4.0000 mg | Freq: Four times a day (QID) | INTRAMUSCULAR | Status: DC | PRN

## 2022-10-16 MED ORDER — SODIUM CHLORIDE 0.9% FLUSH
3.0000 mL | Freq: Two times a day (BID) | INTRAVENOUS | Status: DC
  Administered 2022-10-17 – 2022-10-19 (×5): 3 mL via INTRAVENOUS

## 2022-10-16 MED ORDER — ONDANSETRON HCL 4 MG PO TABS: 4.0000 mg | ORAL_TABLET | Freq: Four times a day (QID) | ORAL | Status: DC | PRN

## 2022-10-16 MED ORDER — SODIUM CHLORIDE 0.9 % IV SOLN
1.0000 g | Freq: Once | INTRAVENOUS | Status: AC
  Administered 2022-10-16: 1 g via INTRAVENOUS
  Filled 2022-10-16: qty 10

## 2022-10-16 MED ORDER — ADULT MULTIVITAMIN W/MINERALS CH
1.0000 | ORAL_TABLET | Freq: Every day | ORAL | Status: DC
  Administered 2022-10-17 – 2022-10-19 (×3): 1 via ORAL
  Filled 2022-10-16 (×3): qty 1

## 2022-10-16 MED ORDER — CLOPIDOGREL BISULFATE 75 MG PO TABS
75.0000 mg | ORAL_TABLET | Freq: Every day | ORAL | Status: DC
  Administered 2022-10-17 – 2022-10-19 (×3): 75 mg via ORAL
  Filled 2022-10-16 (×3): qty 1

## 2022-10-16 MED ORDER — LACTATED RINGERS IV BOLUS
500.0000 mL | Freq: Once | INTRAVENOUS | Status: AC
  Administered 2022-10-16: 500 mL via INTRAVENOUS

## 2022-10-16 MED ORDER — LEVOTHYROXINE SODIUM 50 MCG PO TABS
50.0000 ug | ORAL_TABLET | Freq: Every day | ORAL | Status: DC
  Administered 2022-10-17 – 2022-10-19 (×3): 50 ug via ORAL
  Filled 2022-10-16 (×3): qty 1

## 2022-10-16 MED ORDER — GUAIFENESIN ER 600 MG PO TB12
600.0000 mg | ORAL_TABLET | Freq: Two times a day (BID) | ORAL | Status: DC
  Administered 2022-10-16 – 2022-10-19 (×6): 600 mg via ORAL
  Filled 2022-10-16 (×6): qty 1

## 2022-10-16 MED ORDER — ACETAMINOPHEN 325 MG PO TABS
650.0000 mg | ORAL_TABLET | Freq: Four times a day (QID) | ORAL | Status: DC | PRN
  Administered 2022-10-17: 650 mg via ORAL
  Filled 2022-10-16: qty 2

## 2022-10-16 MED ORDER — ALBUTEROL SULFATE (2.5 MG/3ML) 0.083% IN NEBU: 2.5000 mg | INHALATION_SOLUTION | Freq: Four times a day (QID) | RESPIRATORY_TRACT | Status: DC | PRN

## 2022-10-16 MED ORDER — SIMVASTATIN 20 MG PO TABS
40.0000 mg | ORAL_TABLET | Freq: Every day | ORAL | Status: DC
  Administered 2022-10-16 – 2022-10-18 (×3): 40 mg via ORAL
  Filled 2022-10-16 (×3): qty 2

## 2022-10-16 MED ORDER — SODIUM CHLORIDE 0.9 % IV SOLN
500.0000 mg | Freq: Once | INTRAVENOUS | Status: AC
  Administered 2022-10-16: 500 mg via INTRAVENOUS
  Filled 2022-10-16: qty 5

## 2022-10-16 MED ORDER — DEXAMETHASONE SODIUM PHOSPHATE 10 MG/ML IJ SOLN
6.0000 mg | Freq: Once | INTRAMUSCULAR | Status: AC
  Administered 2022-10-16: 6 mg via INTRAVENOUS
  Filled 2022-10-16: qty 1

## 2022-10-16 MED ORDER — ACETAMINOPHEN 325 MG PO TABS
650.0000 mg | ORAL_TABLET | Freq: Once | ORAL | Status: AC
  Administered 2022-10-16: 650 mg via ORAL
  Filled 2022-10-16: qty 2

## 2022-10-16 MED ORDER — DEXAMETHASONE 6 MG PO TABS
6.0000 mg | ORAL_TABLET | ORAL | Status: DC
  Administered 2022-10-17: 6 mg via ORAL
  Filled 2022-10-16: qty 1

## 2022-10-16 NOTE — ED Provider Notes (Signed)
[name] Provider Note   CSN: [id number] Arrival date & time: 10/16/22  [id number]     History  Chief Complaint  Patient presents with   Dizziness   Anemia   Covid    [name] is a 89 y.o. male.   Dizziness Associated symptoms: weakness (Generalized)   Patient presents for dizziness.  Medical history includes anemia, CAD, aortic stenosis, HLD, GERD, HTN, hypothyroidism, pacemaker placement, CHF.  He was exposed to COVID-19 8 days ago.  He developed symptoms of fatigue, poor appetite, and generalized weakness 6 days ago.  He tested positive on a home test 2 days later.  He was seen by his PCP 3 days ago and prescribed Paxlovid, however, he did not start it.  Yesterday, he had fever and dizziness which prompted him to go to [facility].  Lab work from yesterday showed hemoglobin of 7.4 (decreased from baseline), as well as increased creatinine from baseline.  At that time, he did have improved symptoms and was discharged home.  This morning, he had worsened dizziness.  He had a fever at home of 101 degrees.  The last antipyresis he got was some NyQuil last night.  Due to his worsening symptoms this morning, EMS was called to his home.  EMS noted SpO2 of 84% on room air.  This improved on 4 L of supplemental oxygen.  Patient normally lives independently and is quite active.  Over the past week, he has been sleeping more than usual.  He has not been eating or drinking much.  His daughter has stayed with him for the past 2 nights due to his recent symptoms.  Patient is accompanied today in the ED by his daughter, who assists in history.  Patient denies any current areas of discomfort.  He denies any dizziness at rest.  This seems to occur when he stands up.     Home Medications Prior to Admission medications   Medication Sig Start Date End Date Taking? Authorizing Provider  acetaminophen (TYLENOL) 650 MG CR tablet Take 1,300 mg by  mouth 2 (two) times daily.    [provider]  amLODipine (NORVASC) 2.5 MG tablet Take 1 tablet (2.5 mg total) by mouth daily. 08/09/22   Biagio Borg, MD  CALCIUM PO Take 2 tablets by mouth daily.     [provider]  Cholecalciferol (VITAMIN D) 50 MCG (2000 UT) CAPS Take 2,000 Units by mouth daily.    [provider]  clopidogrel (PLAVIX) 75 MG tablet Take 1 tablet (75 mg total) by mouth daily. 02/19/22   Biagio Borg, MD  fish oil-omega-3 fatty acids 1000 MG capsule Take 1 g by mouth 2 (two) times daily.    [provider]  gemfibrozil (LOPID) 600 MG tablet TAKE 1 TABLET BY MOUTH DAILY. GENERIC EQUIVALENT FOR LOPID 04/23/22   Biagio Borg, MD  HYDROcodone bit-homatropine [facility]) 5-1.5 MG/5ML syrup Take 5 mLs by mouth every 6 (six) hours as needed for up to 10 days. 10/12/22 10/22/22  Biagio Borg, MD  levothyroxine (SYNTHROID) 50 MCG tablet Take 1 tablet (50 mcg total) by mouth daily before breakfast. 02/19/22   Biagio Borg, MD  metoprolol succinate (TOPROL-XL) 50 MG 24 hr tablet Take 1 tablet (50 mg total) by mouth daily. Take with or immediately following a meal. 02/19/22   Biagio Borg, MD  Multiple Vitamins-Minerals (MENS MULTIVITAMIN PLUS PO) Take 1 tablet by mouth daily.  [provider]  nystatin cream (MYCOSTATIN) Apply topically as directed. 12/12/20   [provider]  omeprazole (PRILOSEC) 20 MG capsule Take 1 capsule (20 mg total) by mouth daily. 08/16/22   Biagio Borg, MD  polyethylene glycol [facility] / Floria Raveling) packet Take 17 g by mouth See admin instructions. Take 17g mixed with liquid once daily, may do a second dose as needed for constipation    [provider]  simvastatin (ZOCOR) 40 MG tablet Take 1 tablet (40 mg total) by mouth at bedtime. 02/19/22   Biagio Borg, MD      Allergies    Pantoprazole and Zetia [ezetimibe]    Review of Systems   Review of Systems  Constitutional:  Positive for activity change,  appetite change, fatigue and fever.  Neurological:  Positive for dizziness and weakness (Generalized).  All other systems reviewed and are negative.   Physical Exam Updated Vital Signs BP (!) 107/55   Pulse (!) 53   Temp 98.9 F (37.2 C)   Resp 15   Ht 6' (1.829 m)   Wt 73.5 kg   SpO2 99%   BMI 21.97 kg/m  Physical Exam Vitals and nursing note reviewed.  Constitutional:      General: He is not in acute distress.    Appearance: Normal appearance. He is well-developed. He is not ill-appearing, toxic-appearing or diaphoretic.  HENT:     Head: Normocephalic and atraumatic.     Right Ear: External ear normal.     Left Ear: External ear normal.     Nose: Nose normal.     Mouth/Throat:     Mouth: Mucous membranes are moist.  Eyes:     Extraocular Movements: Extraocular movements intact.     Conjunctiva/sclera: Conjunctivae normal.  Cardiovascular:     Rate and Rhythm: Normal rate and regular rhythm.     Heart sounds: No murmur heard. Pulmonary:     Effort: Pulmonary effort is normal. No respiratory distress.     Breath sounds: Normal breath sounds. No wheezing, rhonchi or rales.  Chest:     Chest wall: No tenderness.  Abdominal:     General: There is no distension.     Palpations: Abdomen is soft.     Tenderness: There is no abdominal tenderness.  Musculoskeletal:        General: No swelling. Normal range of motion.     Cervical back: Normal range of motion and neck supple.     Right lower leg: No edema.     Left lower leg: No edema.  Skin:    General: Skin is warm and dry.     Coloration: Skin is not jaundiced or pale.  Neurological:     General: No focal deficit present.     Mental Status: He is alert and oriented to person, place, and time.     Cranial Nerves: No cranial nerve deficit.     Sensory: No sensory deficit.     Motor: No weakness.     Coordination: Coordination normal.  Psychiatric:        Mood and Affect: Mood normal.        Behavior: Behavior  normal.        Thought Content: Thought content normal.        Judgment: Judgment normal.     ED Results / Procedures / Treatments   Labs (all labs ordered are listed, but only abnormal results are displayed) Labs Reviewed  COMPREHENSIVE METABOLIC PANEL - Abnormal;  Notable for the following components:      Result Value   CO2 19 (*)    Glucose, Bld 104 (*)    BUN 34 (*)    Creatinine, Ser 2.36 (*)    GFR, Estimated 26 (*)    All other components within normal limits  CBC WITH DIFFERENTIAL/PLATELET - Abnormal; Notable for the following components:   RBC 2.07 (*)    Hemoglobin 7.0 (*)    HCT 20.3 (*)    All other components within normal limits  BRAIN NATRIURETIC PEPTIDE - Abnormal; Notable for the following components:   B Natriuretic Peptide 404.2 (*)    All other components within normal limits  TROPONIN I (HIGH SENSITIVITY) - Abnormal; Notable for the following components:   Troponin I (High Sensitivity) 18 (*)    All other components within normal limits  CULTURE, BLOOD (ROUTINE X 2)  CULTURE, BLOOD (ROUTINE X 2)  MAGNESIUM  TSH  LACTIC ACID, PLASMA  LACTIC ACID, PLASMA  POC OCCULT BLOOD, ED  TYPE AND SCREEN  PREPARE RBC (CROSSMATCH)  ABO/RH  TROPONIN I (HIGH SENSITIVITY)    EKG EKG Interpretation  Date/Time:  Tuesday October 16 2022 07:47:35 EDT Ventricular Rate:  55 PR Interval:  196 QRS Duration: 172 QT Interval:  472 QTC Calculation: 452 R Axis:   -87 Text Interpretation: Sinus rhythm Nonspecific IVCD with LAD Left ventricular hypertrophy Abnrm T, consider ischemia, anterolateral lds Confirmed by Godfrey Pick ([id number]) on 10/16/2022 9:25:49 AM  Radiology CT Chest Wo Contrast  Result Date: 10/16/2022 CLINICAL DATA:  Respiratory illness, nondiagnostic x-ray, dizziness, anemia, COVID EXAM: CT CHEST WITHOUT CONTRAST TECHNIQUE: Multidetector CT imaging of the chest was performed following the standard protocol without IV contrast. RADIATION DOSE REDUCTION: This exam  was performed according to the departmental dose-optimization program which includes automated exposure control, adjustment of the mA and/or kV according to patient size and/or use of iterative reconstruction technique. COMPARISON:  None Available. FINDINGS: Cardiovascular: Atherosclerotic calcifications aorta, proximal great vessels, and coronary arteries. Aorta normal caliber. Pacemaker leads RIGHT atrium, RIGHT ventricle, and coronary sinus. Trace pericardial fluid. Mediastinum/Nodes: Esophagus unremarkable. Base of cervical region normal appearance. No thoracic adenopathy. Lungs/Pleura: Peripheral infiltrates in BILATERAL lower lobes. Scattered respiratory motion artifacts. Biapical scarring. Additional subpleural infiltrates in the anterior LEFT upper lobe and RIGHT middle lobe. Additional patchy infiltrates LEFT upper lobe. No pleural effusion or pneumothorax. 5 mm RIGHT upper lobe nodule image 43, noncalcified; see impression Upper Abdomen: Post cholecystectomy. Visualized upper abdomen otherwise unremarkable. Musculoskeletal: Osseous demineralization. IMPRESSION: Peripheral infiltrates in BILATERAL lower lobes, LEFT upper lobe, and RIGHT middle lobe, consistent with pneumonia. 5 mm noncalcified RIGHT upper lobe nodule; per Fleischner Society Guidelines, if patient is low risk for malignancy, no routine follow-up imaging is recommended. If patient is high risk for malignancy, a non-contrast Chest CT at 12 months is optional. If performed and the nodule is stable at 12 months, no further follow-up is recommended. These guidelines do not apply to immunocompromised patients and patients with cancer. Follow up in patients with significant comorbidities as clinically warranted. For lung cancer screening, adhere to Lung-RADS guidelines. Reference: Radiology. 2017; 284(1):228-43. Scattered atherosclerotic disease changes including coronary arteries. Trace pericardial fluid. If patient is high risk for malignancy, a  non-contrast Chest CT at 12 months is optional. Aortic Atherosclerosis (ICD10-I70.0). Electronically Signed   By: Lavonia Dana M.D.   On: 10/16/2022 09:14   CT Head Wo Contrast  Result Date: 10/16/2022 CLINICAL DATA:  Syncope/presyncope, cerebrovascular cause suspected. Dizziness.  EXAM: CT HEAD WITHOUT CONTRAST TECHNIQUE: Contiguous axial images were obtained from the base of the skull through the vertex without intravenous contrast. RADIATION DOSE REDUCTION: This exam was performed according to the departmental dose-optimization program which includes automated exposure control, adjustment of the mA and/or kV according to patient size and/or use of iterative reconstruction technique. COMPARISON:  None Available. FINDINGS: Brain: No acute intracranial hemorrhage. 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 Gray-white differentiation is otherwise preserved. No hydrocephalus or extra-axial collection. No mass effect or midline shift. Vascular: No hyperdense vessel. Skull: No calvarial fracture or suspicious bone lesion. Skull base is unremarkable. Sinuses/Orbits: Mild mucosal disease throughout the paranasal sinuses. Mastoids are well aerated. Orbits are unremarkable. Other: None. IMPRESSION: 1. No acute intracranial abnormality. 2. 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 Electronically Signed   By: Emmit Alexanders M.D.   On: 10/16/2022 08:35   DG Chest Port 1 View  Result Date: 10/15/2022 CLINICAL DATA:  COVID positive with cough for 2 days. EXAM: PORTABLE CHEST 1 VIEW COMPARISON:  Radiographs 11/27/2021 FINDINGS: Stable cardiomediastinal silhouette. Left chest wall CRT-D. Aortic atherosclerotic calcification. Interstitial coarsening has increased since 11/27/2021 and may be due to edema or atypical infection. No focal consolidation, pleural effusion, or pneumothorax. Acute osseous abnormality. IMPRESSION: Interstitial coarsening has increased since 11/27/2021 and  may be due to atypical infection or edema. Electronically Signed   By: Placido Sou M.D.   On: 10/15/2022 01:59    Procedures Procedures    Medications Ordered in ED Medications  azithromycin (ZITHROMAX) 500 mg in sodium chloride 0.9 % 250 mL IVPB (500 mg Intravenous New Bag/Given 10/16/22 1131)  0.9 %  sodium chloride infusion (Manually program via Guardrails IV Fluids) (has no administration in time range)  lactated ringers bolus 500 mL (0 mLs Intravenous Stopped 10/16/22 0935)  acetaminophen (TYLENOL) tablet 650 mg (650 mg Oral Given 10/16/22 0836)  dexamethasone (DECADRON) injection 6 mg (6 mg Intravenous Given 10/16/22 0838)  cefTRIAXone (ROCEPHIN) 1 g in sodium chloride 0.9 % 100 mL IVPB (1 g Intravenous New Bag/Given 10/16/22 1120)  lactated ringers bolus 500 mL (0 mLs Intravenous Stopped 10/16/22 1224)    ED Course/ Medical Decision Making/ A&P                             Medical Decision Making Amount and/or Complexity of Data Reviewed Labs: ordered. Radiology: ordered.  Risk OTC drugs. Prescription drug management. Decision regarding hospitalization.   This patient presents to the ED for concern of dizziness, this involves an extensive number of treatment options, and is a complaint that carries with it a high risk of complications and morbidity.  The differential diagnosis includes COVID-19 symptoms, dehydration, bacterial infection, arrhythmia, orthostatic hypotension, polypharmacy, CVA, vestibular neuritis, BPPV, anemia   Co morbidities that complicate the patient evaluation  anemia, CAD, aortic stenosis, HLD, GERD, HTN, hypothyroidism, pacemaker placement, CHF   Additional history obtained:  Additional history obtained from patient's daughter External records from outside source obtained and reviewed including EMR   Lab Tests:  I Ordered, and personally interpreted labs.  The pertinent results include: Worsened anemia, no leukocytosis, increased creatinine,  high-normal troponin, moderate elevation in BNP   Imaging Studies ordered:  I ordered imaging studies including CT head, CT chest I independently visualized and interpreted imaging which showed no acute intracranial findings; multifocal pneumonia on CT chest. I agree with the radiologist interpretation   Cardiac Monitoring: /  EKG:  The patient was maintained on a cardiac monitor.  I personally viewed and interpreted the cardiac monitored which showed an underlying rhythm of: Sinus rhythm  Problem List / ED Course / Critical interventions / Medication management  Patient presents for generalized weakness, fatigue, and worsening orthostatic dizziness over the past week.  He has chronic anemia and hemoglobin is usually in the range of 9.  He was seen at [facility] yesterday and had a hemoglobin of 7.4.  He has not had any known recent blood loss.  Patient typically lives independently and is active.  Daughter reports that he was chopping wood 1 week ago.  Over the past week, due to his fatigue, he has been staying in the bed through most of the day.  Over the past 2 days, he has had dizziness with standing and difficulty walking.  This worsened overnight prompting EMS to be called.  EMS noted hypoxia on scene.  On arrival, patient is on supplemental oxygen with normal SpO2.  Other vital signs are normal.  EKG shows sinus rhythm with IVCD.  Patient's breathing is unlabored at this time.  Lungs are clear to auscultation.  He denies any current areas of discomfort.  He denies any dizziness at rest.  Given his poor p.o. intake, IV fluids were ordered.  Diagnostic workup was initiated.  Lab work is notable for further downtrend in his hemoglobin.  Currently it is 7.0.  On DRE, no blood or melena is present.  Acute on chronic anemia may be contributing to his recent symptoms.  Patient was consented for blood transfusion.  1 unit of PRBCs was ordered.  On CT scan of chest, there is evidence of multifocal  pneumonia.  Antibiotics were initiated.  Given his multiple problems, including acute hypoxic respiratory failure, patient was admitted to medicine for further management. I ordered medication including IV fluids for AKI, Decadron for COVID-19, and Tylenol for antipyresis, ceftriaxone and azithromycin for multifocal pneumonia, PRBCs for symptomatic anemia Reevaluation of the patient after these medicines showed that the patient improved I have reviewed the patients home medicines and have made adjustments as needed   Social Determinants of Health:  Lives independently  CRITICAL CARE Performed by: Godfrey Pick   Total critical care time: 35 minutes  Critical care time was exclusive of separately billable procedures and treating other patients.  Critical care was necessary to treat or prevent imminent or life-threatening deterioration.  Critical care was time spent personally by me on the following activities: development of treatment plan with patient and/or surrogate as well as nursing, discussions with consultants, evaluation of patient's response to treatment, examination of patient, obtaining history from patient or surrogate, ordering and performing treatments and interventions, ordering and review of laboratory studies, ordering and review of radiographic studies, pulse oximetry and re-evaluation of patient's condition.          Final Clinical Impression(s) / ED Diagnoses Final diagnoses:  Dizziness  Multifocal pneumonia  COVID-19  Symptomatic anemia    Rx / DC Orders ED Discharge Orders     None         Godfrey Pick, MD 10/16/22 1225

## 2022-10-16 NOTE — ED Notes (Signed)
ED TO INPATIENT HANDOFF REPORT  ED Nurse Name and Phone #: cori [phone]  S Name/Age/Gender [name] 89 y.o. male Room/Bed: 039C/039C  Code Status   Code Status: Full Code  Home/SNF/Other Home Patient oriented to: self, place, time, and situation Is this baseline? Yes   Triage Complete: Triage complete  Chief Complaint Acute respiratory failure with hypoxia ([place]) [J96.01]  Triage Note Pt BIB [facility] from home, his home health aid called EMS d/t feeling "unwell," dizzy, uneven gate. Pt was seen at [facility] yesterday for the same s/s & was Dx with Covid & his HGB was 7.1, this has dropped since January when it was 9. His RA was 84% EMS put him on 4L O2 & he was 100%. A/Ox4, denies pain, 99.1, 138/60, CBG 145, RR 16, 20g Lt hand, 12 L unremarkable-does have pacemaker.     Allergies Allergies  Allergen Reactions   Pantoprazole Other (See Comments)    B12 levels dropped on medication   Zetia [Ezetimibe] Other (See Comments)    Arm weakness    Level of Care/Admitting Diagnosis ED Disposition     ED Disposition  Admit   Condition  --   Comment  Hospital Area: [facility] [id number]  Level of Care: Telemetry Medical [104]  May admit patient to [facility] or [facility] if equivalent level of care is available:: No  Covid Evaluation: Confirmed COVID Positive  Diagnosis: Acute respiratory failure with hypoxia [facility]) [id number]  Admitting Physician: Norval Morton [id number]  Attending Physician: Norval Morton [id number]  Certification:: I certify this patient will need inpatient services for at least 2 midnights  Estimated Length of Stay: 2          B Medical/Surgery History Past Medical History:  Diagnosis Date   AICD (automatic cardioverter/defibrillator) present    "turned it off today, put in an extra lead,  and made it just a pacemaker" (08/21/2018)   Anemia, unspecified    Aortic stenosis    Mild, echo, October, 2011   Arthritis     "joints; arms"  (08/21/2018)   AV block, complete ([place])    Permanent pacemaker   CAD (coronary artery disease)    DES to LAD 2005 /  DES to LAD 2008   Cardiomyopathy    Ischemic, EF 30%, 2005   Chronic bronchitis (HCC)    DJD (degenerative joint disease)    Dyslipidemia    Ejection fraction < 50%    EF 30%, echo, 2005  /  EF 30%, catheterization, 2008   GERD (gastroesophageal reflux disease)    Hyperlipidemia    Hypertension    Hypothyroidism    ICD (implantable cardiac defibrillator), single, in situ    for syncope and arrhythmia 2005 / generator change June, 2011   Iron deficiency anemia 06/17/2018   LBBB (left bundle branch block)    Mitral regurgitation    Mild, echo, October, 2011   Myocardial infarction [facility]) 1989   Presence of permanent cardiac pacemaker    Prostate cancer [facility])    Rectal fissure    Syncope    2005, ICD placed   Systolic CHF, chronic ([place])    Vitamin B12 deficiency    Past Surgical History:  Procedure Laterality Date   BIV UPGRADE  08/21/2018   BIV UPGRADE N/A 08/21/2018   Procedure: BIV UPGRADE;  Surgeon: Evans Lance, MD;  Location: [place];  Service: Cardiovascular;  Laterality: N/A;   CARDIAC CATHETERIZATION  2008  ICD IMPLANT  2005   INSERTION PROSTATE RADIATION SEED     LAPAROSCOPIC CHOLECYSTECTOMY  04/2005   Dr. Lucia Gaskins   PROSTATE BIOPSY     RECTAL DILATATION     "stretched it twice" (08/21/2018)   SHOULDER OPEN ROTATOR CUFF REPAIR Right      A IV Location/Drains/Wounds Patient Lines/Drains/Airways Status     Active Line/Drains/Airways     Name Placement date Placement time Site Days   Peripheral IV 10/16/22 20 G Anterior;Left Hand 10/16/22  0748  Hand  less than 1   Peripheral IV 10/16/22 20 G Anterior;Right Forearm 10/16/22  1116  Forearm  less than 1            Intake/Output Last 24 hours  Intake/Output Summary (Last 24 hours) at 10/16/2022 1857 Last data filed at 10/16/2022 1735 Gross per 24 hour  Intake  325 ml  Output 300 ml  Net 25 ml    Labs/Imaging Results for orders placed or performed during the hospital encounter of 10/16/22 (from the past 48 hour(s))  Comprehensive metabolic panel     Status: Abnormal   Collection Time: 10/16/22  8:45 AM  Result Value Ref Range   Sodium 137 135 - 145 mmol/L   Potassium 4.4 3.5 - 5.1 mmol/L   Chloride 105 98 - 111 mmol/L   CO2 19 (L) 22 - 32 mmol/L   Glucose, Bld 104 (H) 70 - 99 mg/dL    Comment: Glucose reference range applies only to samples taken after fasting for at least 8 hours.   BUN 34 (H) 8 - 23 mg/dL   Creatinine, Ser 2.36 (H) 0.61 - 1.24 mg/dL   Calcium 9.0 8.9 - 10.3 mg/dL   Total Protein 6.5 6.5 - 8.1 g/dL   Albumin 3.5 3.5 - 5.0 g/dL   AST 24 15 - 41 U/L   ALT 10 0 - 44 U/L   Alkaline Phosphatase 48 38 - 126 U/L   Total Bilirubin 1.0 0.3 - 1.2 mg/dL   GFR, Estimated 26 (L) >60 mL/min    Comment: (NOTE) Calculated using the CKD-EPI Creatinine Equation (2021)    Anion gap 13 5 - 15    Comment: Performed at [facility] [address] [place], [place] [address]  Magnesium     Status: None   Collection Time: 10/16/22  8:45 AM  Result Value Ref Range   Magnesium 2.1 1.7 - 2.4 mg/dL    Comment: Performed at [facility] [address], [place],  [address]  CBC with Differential/Platelet     Status: Abnormal   Collection Time: 10/16/22  8:45 AM  Result Value Ref Range   WBC 6.3 4.0 - 10.5 K/uL   RBC 2.07 (L) 4.22 - 5.81 MIL/uL   Hemoglobin 7.0 (L) 13.0 - 17.0 g/dL   HCT 20.3 (L) 39.0 - 52.0 %   MCV 98.1 80.0 - 100.0 fL   MCH 33.8 26.0 - 34.0 pg   MCHC 34.5 30.0 - 36.0 g/dL   RDW 12.5 11.5 - 15.5 %   Platelets 179 150 - 400 K/uL   nRBC 0.0 0.0 - 0.2 %   Neutrophils Relative % 74 %   Neutro Abs 4.7 1.7 - 7.7 K/uL   Lymphocytes Relative 13 %   Lymphs Abs 0.8 0.7 - 4.0 K/uL   Monocytes Relative 10 %   Monocytes Absolute 0.6 0.1 - 1.0 K/uL   Eosinophils Relative 3 %   Eosinophils Absolute 0.2 0.0  -  0.5 K/uL   Basophils Relative 0 %   Basophils Absolute 0.0 0.0 - 0.1 K/uL   Immature Granulocytes 0 %   Abs Immature Granulocytes 0.02 0.00 - 0.07 K/uL    Comment: Performed at [facility], [place] [address], [place], [place] [address]  Brain natriuretic peptide     Status: Abnormal   Collection Time: 10/16/22  8:45 AM  Result Value Ref Range   B Natriuretic Peptide 404.2 (H) 0.0 - 100.0 pg/mL    Comment: Performed at [facility] [address], [place], [place] [address]  Troponin I (High Sensitivity)     Status: Abnormal   Collection Time: 10/16/22  8:45 AM  Result Value Ref Range   Troponin I (High Sensitivity) 18 (H) <18 ng/L    Comment: (NOTE) Elevated high sensitivity troponin I (hsTnI) values and significant  changes across serial measurements may suggest ACS but many other  chronic and acute conditions are known to elevate hsTnI results.  Refer to the "Links" section for chest pain algorithms and additional  guidance. Performed at [facility], [place] [address] [place], [place] [address]   Prepare RBC (crossmatch)     Status: None   Collection Time: 10/16/22 10:47 AM  Result Value Ref Range   Order Confirmation      ORDER PROCESSED BY BLOOD BANK Performed at [facility], [place] [address], [place], [place] [address]   Type and screen [place]     Status: None (Preliminary result)   Collection Time: 10/16/22 11:13 AM  Result Value Ref Range   ABO/RH(D) A POS    Antibody Screen NEG    Sample Expiration 10/19/2022,2359    Unit Number [id number]    Blood Component Type RED CELLS,LR    Unit division 00    Status of Unit ISSUED    Transfusion Status OK TO TRANSFUSE    Crossmatch Result      Compatible Performed at [facility], [place] [address] [place], [place] [address]   TSH     Status: None   Collection Time: 10/16/22 11:55 AM  Result Value Ref Range   TSH 1.023 0.350 - 4.500 uIU/mL    Comment: Performed by a  3rd Generation assay with a functional sensitivity of <=0.01 uIU/mL. Performed at [facility], [place] [address] [place], [place] [address]   Troponin I (High Sensitivity)     Status: Abnormal   Collection Time: 10/16/22 11:55 AM  Result Value Ref Range   Troponin I (High Sensitivity) 20 (H) <18 ng/L    Comment: (NOTE) Elevated high sensitivity troponin I (hsTnI) values and significant  changes across serial measurements may suggest ACS but many other  chronic and acute conditions are known to elevate hsTnI results.  Refer to the "Links" section for chest pain algorithms and additional  guidance. Performed at [facility], [place] [address], [place], [place] [address]   POC occult blood, ED     Status: None   Collection Time: 10/16/22 12:22 PM  Result Value Ref Range   Fecal Occult Bld NEGATIVE NEGATIVE  ABO/Rh     Status: None   Collection Time: 10/16/22  1:02 PM  Result Value Ref Range   ABO/RH(D)      A POS Performed at [facility] [address] [place], [place] [address]   Lactic acid, plasma     Status: None   Collection Time:  10/16/22  1:24 PM  Result Value Ref Range   Lactic Acid, Venous 0.7 0.5 - 1.9 mmol/L    Comment: Performed at [facility] [address], [place], [place] [address]   CT Chest Wo Contrast  Result Date: 10/16/2022 CLINICAL DATA:  Respiratory illness, nondiagnostic x-ray, dizziness, anemia, COVID EXAM: CT CHEST WITHOUT CONTRAST TECHNIQUE: Multidetector CT imaging of the chest was performed following the standard protocol without IV contrast. RADIATION DOSE REDUCTION: This exam was performed according to the departmental dose-optimization program which includes automated exposure control, adjustment of the mA and/or kV according to patient size and/or use of iterative reconstruction technique. COMPARISON:  None Available. FINDINGS: Cardiovascular: Atherosclerotic calcifications aorta, proximal great vessels, and coronary arteries.  Aorta normal caliber. Pacemaker leads RIGHT atrium, RIGHT ventricle, and coronary sinus. Trace pericardial fluid. Mediastinum/Nodes: Esophagus unremarkable. Base of cervical region normal appearance. No thoracic adenopathy. Lungs/Pleura: Peripheral infiltrates in BILATERAL lower lobes. Scattered respiratory motion artifacts. Biapical scarring. Additional subpleural infiltrates in the anterior LEFT upper lobe and RIGHT middle lobe. Additional patchy infiltrates LEFT upper lobe. No pleural effusion or pneumothorax. 5 mm RIGHT upper lobe nodule image 43, noncalcified; see impression Upper Abdomen: Post cholecystectomy. Visualized upper abdomen otherwise unremarkable. Musculoskeletal: Osseous demineralization. IMPRESSION: Peripheral infiltrates in BILATERAL lower lobes, LEFT upper lobe, and RIGHT middle lobe, consistent with pneumonia. 5 mm noncalcified RIGHT upper lobe nodule; per Fleischner Society Guidelines, if patient is low risk for malignancy, no routine follow-up imaging is recommended. If patient is high risk for malignancy, a non-contrast Chest CT at 12 months is optional. If performed and the nodule is stable at 12 months, no further follow-up is recommended. These guidelines do not apply to immunocompromised patients and patients with cancer. Follow up in patients with significant comorbidities as clinically warranted. For lung cancer screening, adhere to Lung-RADS guidelines. Reference: Radiology. 2017; 284(1):228-43. Scattered atherosclerotic disease changes including coronary arteries. Trace pericardial fluid. If patient is high risk for malignancy, a non-contrast Chest CT at 12 months is optional. Aortic Atherosclerosis (ICD10-I70.0). Electronically Signed   By: Lavonia Dana M.D.   On: 10/16/2022 09:14   CT Head Wo Contrast  Result Date: 10/16/2022 CLINICAL DATA:  Syncope/presyncope, cerebrovascular cause suspected. Dizziness. EXAM: CT HEAD WITHOUT CONTRAST TECHNIQUE: Contiguous axial images were  obtained from the base of the skull through the vertex without intravenous contrast. RADIATION DOSE REDUCTION: This exam was performed according to the departmental dose-optimization program which includes automated exposure control, adjustment of the mA and/or kV according to patient size and/or use of iterative reconstruction technique. COMPARISON:  None Available. FINDINGS: Brain: No acute intracranial hemorrhage. 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 Gray-white differentiation is otherwise preserved. No hydrocephalus or extra-axial collection. No mass effect or midline shift. Vascular: No hyperdense vessel. Skull: No calvarial fracture or suspicious bone lesion. Skull base is unremarkable. Sinuses/Orbits: Mild mucosal disease throughout the paranasal sinuses. Mastoids are well aerated. Orbits are unremarkable. Other: None. IMPRESSION: 1. No acute intracranial abnormality. 2. 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 Electronically Signed   By: Emmit Alexanders M.D.   On: 10/16/2022 08:35   DG Chest Port 1 View  Result Date: 10/15/2022 CLINICAL DATA:  COVID positive with cough for 2 days. EXAM: PORTABLE CHEST 1 VIEW COMPARISON:  Radiographs 11/27/2021 FINDINGS: Stable cardiomediastinal silhouette. Left chest wall CRT-D. Aortic atherosclerotic calcification. Interstitial coarsening has increased since 11/27/2021 and may be due to edema or atypical infection. No focal consolidation, pleural effusion, or pneumothorax.  Acute osseous abnormality. IMPRESSION: Interstitial coarsening has increased since 11/27/2021 and may be due to atypical infection or edema. Electronically Signed   By: Placido Sou M.D.   On: 10/15/2022 01:59    Pending Labs Unresulted Labs (From admission, onward)     Start     Ordered   10/17/22 0500  CBC  Tomorrow morning,   R        10/16/22 1546   10/17/22 [id number]  Basic metabolic panel  Tomorrow morning,   R        10/16/22 1546    10/16/22 1231  Procalcitonin  Once,   R       References:    Procalcitonin Lower Respiratory Tract Infection AND Sepsis Procalcitonin Algorithm   10/16/22 1230   10/16/22 1118  Lactic acid, plasma  Now then every 2 hours,   R (with STAT occurrences)      10/16/22 1118            Vitals/Pain Today's Vitals   10/16/22 1615 10/16/22 1630 10/16/22 1645 10/16/22 1740  BP: (!) 107/59 (!) 107/57 (!) 114/57 (!) 121/56  Pulse: (!) 52 (!) 51 (!) 52 (!) 50  Resp: '16 15 14 12  '$ Temp:    97.7 F (36.5 C)  TempSrc:    Axillary  SpO2: 100% 100% 100% 100%  Weight:      Height:      PainSc:        Isolation Precautions No active isolations  Medications Medications  cefTRIAXone (ROCEPHIN) 1 g in sodium chloride 0.9 % 100 mL IVPB (has no administration in time range)  sodium chloride flush (NS) 0.9 % injection 3 mL (has no administration in time range)  acetaminophen (TYLENOL) tablet 650 mg (has no administration in time range)    Or  acetaminophen (TYLENOL) suppository 650 mg (has no administration in time range)  ondansetron (ZOFRAN) tablet 4 mg (has no administration in time range)    Or  ondansetron (ZOFRAN) injection 4 mg (has no administration in time range)  guaiFENesin (MUCINEX) 12 hr tablet 600 mg (has no administration in time range)  albuterol (PROVENTIL) (2.5 MG/3ML) 0.083% nebulizer solution 2.5 mg (has no administration in time range)  lactated ringers bolus 500 mL (0 mLs Intravenous Stopped 10/16/22 0935)  acetaminophen (TYLENOL) tablet 650 mg (650 mg Oral Given 10/16/22 0836)  dexamethasone (DECADRON) injection 6 mg (6 mg Intravenous Given 10/16/22 0838)  cefTRIAXone (ROCEPHIN) 1 g in sodium chloride 0.9 % 100 mL IVPB (0 g Intravenous Stopped 10/16/22 1236)  azithromycin (ZITHROMAX) 500 mg in sodium chloride 0.9 % 250 mL IVPB (0 mg Intravenous Stopped 10/16/22 1234)  lactated ringers bolus 500 mL (0 mLs Intravenous Stopped 10/16/22 1224)  0.9 %  sodium chloride infusion  (Manually program via Guardrails IV Fluids) (0 mLs Intravenous Stopped 10/16/22 1740)    Mobility walks     Focused Assessments Pulmonary Assessment Handoff:  Lung sounds:   O2 Device: Nasal Cannula O2 Flow Rate (L/min): 4 L/min    R Recommendations: See Admitting Provider Note  Report given to:   Additional Notes: seen yesterday dx with covid, sent home. Came back today hypoxic. Requiring 4L [place] admitting for covid, HGB was 7.0 so we gave him 1 unit of PRBCs.

## 2022-10-16 NOTE — Plan of Care (Signed)
  Problem: Education: Goal: Knowledge of General Education information will improve Description: Including pain rating scale, medication(s)/side effects and non-pharmacologic comfort measures Outcome: Progressing   Problem: Safety: Goal: Ability to remain free from injury will improve Outcome: Progressing   

## 2022-10-16 NOTE — H&P (Addendum)
History and Physical    Patient: [name] [id number] DOB: 1931-11-10 DOA: 10/16/2022 DOS: the patient was seen and examined on 10/16/2022 PCP: Biagio Borg, MD  Patient coming from: Home (lives alone) via EMS  Chief Complaint:  Chief Complaint  Patient presents with   Dizziness   Anemia   Covid    HPI: [name] is a 89 y.o. male with medical history significant of hypertension, hyperlipidemia, CAD, HFpEF, CHB s/p PM, and hypothyroidism who presents with complaints of dizziness and weakness.  History is obtained from the patient with assistance of his daughter who is present at bedside.  She notes that she had contracted COVID in 19 and thinks that she likely gave it to her father as he developed a cough.  She tested him with a at home test 4 days ago and it was positive.  Initially his cough was nonproductive, but over the last couple days it become productive.  A prescription for Plaxlovid hasd been called him, but they were not notified about it or when it was ready so never picked it up.  Over the last couple of days patient has been sleeping more and has not eating or drinking much.  Patient reports that he had been feeling dizzy.  He has been taking NyQuil at home for symptoms.  His daughter had taken him to [facility] yesterday due to his symptoms.  Labs from yesterday significant for hemoglobin 7.4 (baseline of 9 g/dL) and creatinine 2.21(baseline creatinine previously 1.6-1.7) with BUN 35.  Chest x-ray noted interstitial coarsening increased from 11/2021 concerning for atypical infection or edema.  Due to his kidney function Paxlovid was not recommended.  Ultimately patient was discharged home.  His daughter stayed with him and reported that he still seemed very steady on his feet.  Patient notes that he feels like he is being pulled backwards.  Normally he is ambulatory without assistance and not on oxygen at baseline.  He was noted to have fever up to 101.2 F  at home and due to him getting worse his daughter called EMS.     EMS found the patient to have O2 saturations as low as 84% on room air and was placed on 4 L nasal cannula oxygen with improvement of O2 saturations.  In the emergency department patient was noted to be afebrile with pulse 51-58, respirations 15-23, blood pressures 98/41 to 130/60, and O2 saturations currently maintained on 2 L nasal cannula oxygen.  Labs were significant for hemoglobin 7, BUN 34, creatinine 2.36, glucose 104, BNP 404.2, and troponin 18.  CT of the head did not note any acute abnormality and old lacunar infarct of the left inferior cerebellar hemisphere.  CT scan of the chest significant for infiltrates concerning for pneumonia with a 5 mm noncalcified right upper lobe nodule.  Patient has been given acetaminophen 650 mg, Rocephin, Decadron 6 mg IV, and 500 mLlactated Ringer's patient's pacemaker was interrogated and noted to be functioning properly.  Review of Systems: As mentioned in the history of present illness. All other systems reviewed and are negative. Past Medical History:  Diagnosis Date   AICD (automatic cardioverter/defibrillator) present    "turned it off today, put in an extra lead,  and made it just a pacemaker" (08/21/2018)   Anemia, unspecified    Aortic stenosis    Mild, echo, October, 2011   Arthritis    "joints; arms"  (08/21/2018)   AV block, complete ([place])    Permanent pacemaker  CAD (coronary artery disease)    DES to LAD 2005 /  DES to LAD 2008   Cardiomyopathy    Ischemic, EF 30%, 2005   Chronic bronchitis (HCC)    DJD (degenerative joint disease)    Dyslipidemia    Ejection fraction < 50%    EF 30%, echo, 2005  /  EF 30%, catheterization, 2008   GERD (gastroesophageal reflux disease)    Hyperlipidemia    Hypertension    Hypothyroidism    ICD (implantable cardiac defibrillator), single, in situ    for syncope and arrhythmia 2005 / generator change June, 2011   Iron deficiency  anemia 06/17/2018   LBBB (left bundle branch block)    Mitral regurgitation    Mild, echo, October, 2011   Myocardial infarction [facility]) 1989   Presence of permanent cardiac pacemaker    Prostate cancer [facility])    Rectal fissure    Syncope    2005, ICD placed   Systolic CHF, chronic ([place])    Vitamin B12 deficiency    Past Surgical History:  Procedure Laterality Date   BIV UPGRADE  08/21/2018   BIV UPGRADE N/A 08/21/2018   Procedure: BIV UPGRADE;  Surgeon: Evans Lance, MD;  Location: [place] [facility];  Service: Cardiovascular;  Laterality: N/A;   CARDIAC CATHETERIZATION  2008   ICD IMPLANT  2005   INSERTION PROSTATE RADIATION SEED     LAPAROSCOPIC CHOLECYSTECTOMY  04/2005   Dr. Lucia Gaskins   PROSTATE BIOPSY     RECTAL DILATATION     "stretched it twice" (08/21/2018)   SHOULDER OPEN ROTATOR CUFF REPAIR Right    Social History:  reports that he quit smoking about 70 years ago. His smoking use included cigarettes. He has a 2.00 pack-year smoking history. He has never used smokeless tobacco. He reports that he does not drink alcohol and does not use drugs.  Allergies  Allergen Reactions   Pantoprazole Other (See Comments)    B12 levels dropped on medication   Zetia [Ezetimibe] Other (See Comments)    Arm weakness    Family History  Problem Relation Age of Onset   Stroke Mother    Cancer Mother 84       unknown   Heart attack Father 70   Heart Problems Father    Heart attack Brother    Lung cancer Brother    Stroke Brother    Heart Problems Brother        CABG   Lung cancer Sister    Other Sister        PACEMAKER   Breast cancer Maternal Aunt     Prior to Admission medications   Medication Sig Start Date End Date Taking? Authorizing Provider  acetaminophen (TYLENOL) 650 MG CR tablet Take 1,300 mg by mouth 2 (two) times daily.    [provider]  amLODipine (NORVASC) 2.5 MG tablet Take 1 tablet (2.5 mg total) by mouth daily. 08/09/22   Biagio Borg, MD  CALCIUM  PO Take 2 tablets by mouth daily.     [provider]  Cholecalciferol (VITAMIN D) 50 MCG (2000 UT) CAPS Take 2,000 Units by mouth daily.    [provider]  clopidogrel (PLAVIX) 75 MG tablet Take 1 tablet (75 mg total) by mouth daily. 02/19/22   Biagio Borg, MD  fish oil-omega-3 fatty acids 1000 MG capsule Take 1 g by mouth 2 (two) times daily.    [provider]  gemfibrozil (LOPID) 600 MG  tablet TAKE 1 TABLET BY MOUTH DAILY. GENERIC EQUIVALENT FOR LOPID 04/23/22   Biagio Borg, MD  HYDROcodone bit-homatropine [facility]) 5-1.5 MG/5ML syrup Take 5 mLs by mouth every 6 (six) hours as needed for up to 10 days. 10/12/22 10/22/22  Biagio Borg, MD  levothyroxine (SYNTHROID) 50 MCG tablet Take 1 tablet (50 mcg total) by mouth daily before breakfast. 02/19/22   Biagio Borg, MD  metoprolol succinate (TOPROL-XL) 50 MG 24 hr tablet Take 1 tablet (50 mg total) by mouth daily. Take with or immediately following a meal. 02/19/22   Biagio Borg, MD  Multiple Vitamins-Minerals (MENS MULTIVITAMIN PLUS PO) Take 1 tablet by mouth daily.    [provider]  nystatin cream (MYCOSTATIN) Apply topically as directed. 12/12/20   [provider]  omeprazole (PRILOSEC) 20 MG capsule Take 1 capsule (20 mg total) by mouth daily. 08/16/22   Biagio Borg, MD  polyethylene glycol [facility] Floria Raveling) packet Take 17 g by mouth See admin instructions. Take 17g mixed with liquid once daily, may do a second dose as needed for constipation    [provider]  simvastatin (ZOCOR) 40 MG tablet Take 1 tablet (40 mg total) by mouth at bedtime. 02/19/22   Biagio Borg, MD    Physical Exam: Vitals:   10/16/22 0900 10/16/22 0930 10/16/22 1000 10/16/22 1100  BP: (!) 114/34 91/61 (!) 98/41 (!) 107/55  Pulse: (!) 57 (!) 51 (!) 52 (!) 53  Resp: '18 15 19 15  '$ Temp:      TempSrc:      SpO2: 100% 99% 99% 99%  Weight:      Height:       Constitutional: Elderly male who appears ill but in  no acute distress at this time Eyes: PERRL, lids and conjunctivae normal ENMT: Mucous membranes are moist.  Hearing aid present behind the left ear. Neck: normal, supple, no significant JVD appreciated Respiratory: Respiratory effort with decreased overall aeration and some mild crackles heard in the base on the right lung field.  Patient on 3 L nasal cannula oxygen and able to talk with sentences. Cardiovascular: Bradycardic with positive murmur 2/6 appreciated.  No significant lower extremity swelling. Abdomen: no tenderness, no masses palpated. No hepatosplenomegaly. Bowel sounds positive.  Musculoskeletal: no clubbing / cyanosis. No joint deformity upper and lower extremities. Good ROM, no contractures. Normal muscle tone.  Skin: no rashes, lesions, ulcers. No induration Neurologic: CN 2-12 grossly intact. Sensation intact, DTR normal. Strength 5/5 in all 4.  Psychiatric: Normal judgment and insight. Alert and oriented x 3. Normal mood.   Data Reviewed:   EKG reveals sinus rhythm at 55 bpm with nonspecific IVCD with LAD.  Reviewed labs, imaging, and pertinent records as noted above in HPI.  Assessment and Plan:  Acute respiratory failure with hypoxia secondary to pneumonia due to COVID-19 Patient presents with cough and shortness of breath.  Had tested positive for COVID-19 on 3/8 with an at-home test.  Found to be hypoxic with O2 saturations as low as 84% on room air placed on 4 L of nasal cannula oxygen with improvement to 100%.  CT angiogram of the chest did not note any concern for pulmonary embolism, but did note concern for multifocal pneumonia.  Patient is not a candidate for Paxlovid due to kidney function and being out of the window for treatment.  Question possibility of underlying bacterial infection as cause of patient's symptoms.  Patient has been given Decadron, Rocephin, and  azithromycin. -Admit to a telemetry bed -Continuous pulse oximetry with nasal cannula oxygen to  maintain O2 saturation greater than 92% -Incentive spirometry and flutter valve -Check procalcitonin -Continue empiric antibiotics of Rocephin and azithromycin -Mucinex -Breathing treatments as needed  Transient hypotension Acute.  On admission blood pressures initially as low as 98/41.  Patient had been given IV fluids with improvement in blood pressures. -Goal MAP greater than 65 -Hold home blood pressure medication regimen of metoprolol and amlodipine.  Elevated troponin Acute.  High-sensitivity troponin 18-> 20.  Patient denied any complaints of chest pain. -Continue to monitor  Acute kidney injury superimposed on chronic kidney disease stage IIIb Patient presents with creatinine elevated up to 2.36 with BUN 24.  Baseline creatinine previously noted to be around 1.6-1.8.  Patient has been given IV fluid bolus -Recheck kidney function in a.m.  Normocytic anemia Acute on chronic.  Hemoglobin noted to be 7 today, but previously been around 9 in January of this year. Patient been typed and screened and ordered 1 unit of packed red blood cells.  Unclear cause of patient's drop in hemoglobin. -Follow-up stool guaiac(negative) -Continue with transfusion of 1 unit of packed red blood cells -Recheck H&H posttransfusion  Dizziness Weakness Acute.  Patient reported feeling dizzy and being off balance where he feels like he is falling backwards.  CT scan of the head did not note any acute abnormality.  Patient's pacemaker is not MRI compatible per daughter.   -Neuro check -Up with assistance -Check orthostatic vital signs -Physical therapy to eval and treat  Heart failure with preserved EF Chronic.  Patient appears to be compensated at this time and does not have any significant lower extremity edema or signs of JVD.  BNP was noted to be elevated at 404.2.  Last echocardiogram noted EF to be 50- 55% with indeterminate diastolic parameters back in 2020.  He is not on any diuretics at  baseline. -Strict I&O's -Daily weights  Complete heart block s/p PPM Pacemaker was interrogated and noted to be functioning properly.  Hypothyroidism TSH 1.023 -Continue levothyroxine  Dyslipidemia -Continue current medication regimen  Pulmonary nodule Patient noted to have a 5 mm right upper lobe nodule on CT imaging.  DVT prophylaxis: SCDs.  Consider starting DVT prophylaxis if hemoglobin remains stable posttransfusion.  Advance Care Planning:   Code Status: Full Code    Consults: None  Family Communication: Daughter updated at bedside  Severity of Illness: The appropriate patient status for this patient is INPATIENT. Inpatient status is judged to be reasonable and necessary in order to provide the required intensity of service to ensure the patient's safety. The patient's presenting symptoms, physical exam findings, and initial radiographic and laboratory data in the context of their chronic comorbidities is felt to place them at high risk for further clinical deterioration. Furthermore, it is not anticipated that the patient will be medically stable for discharge from the hospital within 2 midnights of admission.   * I certify that at the point of admission it is my clinical judgment that the patient will require inpatient hospital care spanning beyond 2 midnights from the point of admission due to high intensity of service, high risk for further deterioration and high frequency of surveillance required.*  Author: Norval Morton, MD 10/16/2022 11:57 AM  For on call review [email or web address].

## 2022-10-16 NOTE — ED Notes (Signed)
Patient transported to CT 

## 2022-10-16 NOTE — ED Notes (Signed)
Per [facility] [facility] no episodes with pacemaker. Functioning WNL.

## 2022-10-16 NOTE — Transitions of Care (Post Inpatient/ED Visit) (Unsigned)
   10/16/2022  Name: [name] MRN: [id number] DOB: Feb 21, 1932  Today's TOC FU Call Status: Today's TOC FU Call Status:: Unsuccessul Call (1st Attempt) Unsuccessful Call (1st Attempt) Date: 10/16/22  Attempted to reach the patient regarding the most recent Inpatient/ED visit.  Follow Up Plan: Additional outreach attempts will be made to reach the patient to complete the Transitions of Care (Post Inpatient/ED visit) call.   Signature Juanda Crumble, [place] Direct Dial [phone]

## 2022-10-16 NOTE — ED Triage Notes (Signed)
Pt BIB [facility] from home, his home health aid called EMS d/t feeling "unwell," dizzy, uneven gate. Pt was seen at [facility] yesterday for the same s/s & was Dx with Covid & his HGB was 7.1, this has dropped since January when it was 9. His RA was 84% EMS put him on 4L O2 & he was 100%. A/Ox4, denies pain, 99.1, 138/60, CBG 145, RR 16, 20g Lt hand, 12 L unremarkable-does have pacemaker.

## 2022-10-17 DIAGNOSIS — J9601 Acute respiratory failure with hypoxia: Secondary | ICD-10-CM | POA: Diagnosis not present

## 2022-10-17 DIAGNOSIS — I5032 Chronic diastolic (congestive) heart failure: Secondary | ICD-10-CM | POA: Diagnosis not present

## 2022-10-17 DIAGNOSIS — U071 COVID-19: Secondary | ICD-10-CM | POA: Diagnosis not present

## 2022-10-17 DIAGNOSIS — I442 Atrioventricular block, complete: Secondary | ICD-10-CM | POA: Diagnosis not present

## 2022-10-17 LAB — C-REACTIVE PROTEIN: CRP: 14.8 mg/dL — ABNORMAL HIGH (ref <1.0)

## 2022-10-17 LAB — TYPE AND SCREEN
ABO/RH(D): A POS
Antibody Screen: NEGATIVE
Unit division: 0

## 2022-10-17 LAB — BASIC METABOLIC PANEL
Anion gap: 8 (ref 5–15)
BUN: 33 mg/dL — ABNORMAL HIGH (ref 8–23)
CO2: 23 mmol/L (ref 22–32)
Calcium: 9.2 mg/dL (ref 8.9–10.3)
Chloride: 108 mmol/L (ref 98–111)
Creatinine, Ser: 1.82 mg/dL — ABNORMAL HIGH (ref 0.61–1.24)
GFR, Estimated: 35 mL/min — ABNORMAL LOW (ref >60)
Glucose, Bld: 122 mg/dL — ABNORMAL HIGH (ref 70–99)
Potassium: 4.7 mmol/L (ref 3.5–5.1)
Sodium: 139 mmol/L (ref 135–145)

## 2022-10-17 LAB — CBC
HCT: 24.7 % — ABNORMAL LOW (ref 39.0–52.0)
Hemoglobin: 8.7 g/dL — ABNORMAL LOW (ref 13.0–17.0)
MCH: 32.7 pg (ref 26.0–34.0)
MCHC: 35.2 g/dL (ref 30.0–36.0)
MCV: 92.9 fL (ref 80.0–100.0)
Platelets: 177 10*3/uL (ref 150–400)
RBC: 2.66 MIL/uL — ABNORMAL LOW (ref 4.22–5.81)
RDW: 13.2 % (ref 11.5–15.5)
WBC: 6.5 10*3/uL (ref 4.0–10.5)
nRBC: 0 % (ref 0.0–0.2)

## 2022-10-17 LAB — BPAM RBC
Blood Product Expiration Date: 202404012359
ISSUE DATE / TIME: 202403121450
Unit Type and Rh: 6200

## 2022-10-17 MED ORDER — SODIUM CHLORIDE 0.9 % IV SOLN
2.0000 g | INTRAVENOUS | Status: DC
  Administered 2022-10-17 – 2022-10-19 (×3): 2 g via INTRAVENOUS
  Filled 2022-10-17 (×3): qty 20

## 2022-10-17 MED ORDER — ENOXAPARIN SODIUM 40 MG/0.4ML IJ SOSY
40.0000 mg | PREFILLED_SYRINGE | INTRAMUSCULAR | Status: DC
  Administered 2022-10-17: 40 mg via SUBCUTANEOUS
  Filled 2022-10-17: qty 0.4

## 2022-10-17 MED ORDER — SODIUM CHLORIDE 0.9 % IV SOLN
200.0000 mg | Freq: Once | INTRAVENOUS | Status: AC
  Administered 2022-10-17: 200 mg via INTRAVENOUS
  Filled 2022-10-17: qty 40

## 2022-10-17 MED ORDER — SODIUM CHLORIDE 0.9 % IV SOLN
500.0000 mg | INTRAVENOUS | Status: DC
  Administered 2022-10-17 – 2022-10-19 (×3): 500 mg via INTRAVENOUS
  Filled 2022-10-17 (×3): qty 5

## 2022-10-17 MED ORDER — METHYLPREDNISOLONE SODIUM SUCC 40 MG IJ SOLR
40.0000 mg | Freq: Two times a day (BID) | INTRAMUSCULAR | Status: DC
  Administered 2022-10-17 – 2022-10-19 (×4): 40 mg via INTRAVENOUS
  Filled 2022-10-17 (×4): qty 1

## 2022-10-17 MED ORDER — SODIUM CHLORIDE 0.9 % IV SOLN
100.0000 mg | Freq: Every day | INTRAVENOUS | Status: AC
  Administered 2022-10-18 – 2022-10-19 (×2): 100 mg via INTRAVENOUS
  Filled 2022-10-17 (×2): qty 20

## 2022-10-17 NOTE — Evaluation (Signed)
Occupational Therapy Evaluation Patient Details Name: [name] MRN: [id number] DOB: 1931-10-26 Today's Date: 10/17/2022   History of Present Illness [name] is a 89 y.o. male who presents with complaints of dizziness and weakness. Admitted for acute respiratory failure with hypoxia secondary to pneumonia due to COVID-19. PMHx:  hypertension, hyperlipidemia, pacemaker, CAD, HFpEF, CHB s/p PM, and hypothyroidism   Clinical Impression   [name] was evaluated s/p the above admission list. He lives alone and is indep at baseline. Upon evaluation he was limited by decreased activity tolerance, generalized weakness, unsteady balance and new O2 demand. Overall he required up to min A for mobility in the room without AD, and mod A for LB ADLs. Pt will benefit from continued acute OT services. Recommend d/c to home with increased assist from family initially and [place].       Recommendations for follow up therapy are one component of a multi-disciplinary discharge planning process, led by the attending physician.  Recommendations may be updated based on patient status, additional functional criteria and insurance authorization.   Follow Up Recommendations  Home health OT     Assistance Recommended at Discharge Frequent or constant Supervision/Assistance  Patient can return home with the following A little help with walking and/or transfers;A little help with bathing/dressing/bathroom;Assistance with cooking/housework;Assist for transportation;Help with stairs or ramp for entrance    Functional Status Assessment  Patient has had a recent decline in their functional status and demonstrates the ability to make significant improvements in function in a reasonable and predictable amount of time.  Equipment Recommendations  Other (comment) (pending acute progress)       Precautions / Restrictions Precautions Precautions: Fall Precaution Comments: covid+ Restrictions Weight Bearing  Restrictions: No      Mobility Bed Mobility Overal bed mobility: Needs Assistance Bed Mobility: Supine to Sit     Supine to sit: Supervision          Transfers Overall transfer level: Needs assistance Equipment used: 2 person hand held assist Transfers: Sit to/from Stand Sit to Stand: Min guard                  Balance Overall balance assessment: Needs assistance Sitting-balance support: Feet supported Sitting balance-Leahy Scale: Good     Standing balance support: Single extremity supported, During functional activity Standing balance-Leahy Scale: Fair                             ADL either performed or assessed with clinical judgement   ADL Overall ADL's : Needs assistance/impaired Eating/Feeding: Set up;Sitting   Grooming: Set up;Sitting   Upper Body Bathing: Set up;Sitting   Lower Body Bathing: Moderate assistance;Sit to/from stand   Upper Body Dressing : Set up;Sitting   Lower Body Dressing: Moderate assistance;Sit to/from stand   Toilet Transfer: Minimal assistance;Ambulation   Toileting- Clothing Manipulation and Hygiene: Supervision/safety;Sitting/lateral lean       Functional mobility during ADLs: Minimal assistance General ADL Comments: limited by unsteady gait, new O2 demand, activity tolerance and weakness     Vision Baseline Vision/History: 1 Wears glasses;4 Cataracts Patient Visual Report: No change from baseline;Diplopia Vision Assessment?: Vision impaired- to be further tested in functional context Additional Comments: pt reports he has DV and depth perception issues at baseline     Perception Perception Perception Tested?: No   Praxis Praxis Praxis tested?: Not tested    Pertinent Vitals/Pain Pain Assessment Pain Assessment: No/denies pain  Hand Dominance Right   Extremity/Trunk Assessment Upper Extremity Assessment Upper Extremity Assessment: Generalized weakness   Lower Extremity Assessment Lower  Extremity Assessment: Defer to PT evaluation   Cervical / Trunk Assessment Cervical / Trunk Assessment: Kyphotic   Communication Communication Communication: No difficulties   Cognition Arousal/Alertness: Awake/alert Behavior During Therapy: WFL for tasks assessed/performed Overall Cognitive Status: Within Functional Limits for tasks assessed         General Comments  VSS on 2L     Home Living Family/patient expects to be discharged to:: Private residence Living Arrangements: Alone Available Help at Discharge: Family;Available 24 hours/day Type of Home: House Home Access: Stairs to enter [employer] of Steps: 2 Entrance Stairs-Rails: Right;Left Home Layout: One level     Bathroom Shower/Tub: Teacher, early years/pre: Standard     Home Equipment: None          Prior Functioning/Environment Prior Level of Function : Independent/Modified Independent;Driving             Mobility Comments: no AD ADLs Comments: indep ADL/IADLs, drives short distances        OT Problem List: Decreased strength;Decreased range of motion;Decreased activity tolerance;Impaired balance (sitting and/or standing);Decreased safety awareness;Cardiopulmonary status limiting activity      OT Treatment/Interventions: Self-care/ADL training;Therapeutic exercise;DME and/or AE instruction;Energy conservation;Therapeutic activities;Patient/family education;Balance training    OT Goals(Current goals can be found in the care plan section) Acute Rehab OT Goals Patient Stated Goal: to get better OT Goal Formulation: With patient Time For Goal Achievement: 10/31/22 Potential to Achieve Goals: Good ADL Goals Pt Will Perform Grooming: with supervision;standing Pt Will Perform Lower Body Dressing: with supervision;sit to/from stand Pt Will Transfer to Toilet: ambulating Additional ADL Goal #1: Pt will complete at least 10x OOB activity to demonstrate increased activity  tolerance  OT Frequency: Min 2X/week       AM-PAC OT "6 Clicks" Daily Activity     Outcome Measure Help from another person eating meals?: None Help from another person taking care of personal grooming?: A Little Help from another person toileting, which includes using toliet, bedpan, or urinal?: A Little Help from another person bathing (including washing, rinsing, drying)?: A Lot Help from another person to put on and taking off regular upper body clothing?: None Help from another person to put on and taking off regular lower body clothing?: A Lot 6 Click Score: 18   End of Session Equipment Utilized During Treatment: Gait belt;Oxygen Nurse Communication: Mobility status  Activity Tolerance: Patient tolerated treatment well Patient left: in chair;with call bell/phone within reach;with family/visitor present  OT Visit Diagnosis: Unsteadiness on feet (R26.81);Other abnormalities of gait and mobility (R26.89);Muscle weakness (generalized) (M62.81)                Time: 0938-1000 OT Time Calculation (min): 22 min Charges:  OT General Charges $OT Visit: 1 Visit OT Evaluation $OT Eval Moderate Complexity: [place], OTR/L Acute Rehabilitation Services Office [place] Communication Preferred   Elliot Cousin 10/17/2022, 10:35 AM

## 2022-10-17 NOTE — Plan of Care (Signed)

## 2022-10-17 NOTE — Transitions of Care (Post Inpatient/ED Visit) (Signed)
   10/17/2022  Name: [name] MRN: [id number] DOB: [birth year]  Today's TOC FU Call Status: Today's TOC FU Call Status:: Unsuccessful Call (2nd Attempt) Unsuccessful Call (1st Attempt) Date: 10/16/22 Unsuccessful Call (2nd Attempt) Date: 10/17/22  Attempted to reach the patient regarding the most recent Inpatient/ED visit.  Follow Up Plan: No further outreach attempts will be made at this time. We have been unable to contact the patient.  Signature Juanda Crumble, [place] Direct Dial [phone]

## 2022-10-17 NOTE — TOC Initial Note (Addendum)
Transition of Care [facility]) - Initial/Assessment Note    Patient Details  Name: [name] MRN: [id number] Date of Birth: [birth year]  Transition of Care [facility]) CM/SW Contact:    Levonne Lapping, RN Phone Number: 10/17/2022, 2:53 PM  Clinical Narrative:         CM met with Patient and Daughter to complete inittial assessment and  discuss recs for dc. PT/OT both recommend Home Health Choice offered- [facility] will provide services . Information entered in top AVS.   Daughter states patient is very independent pta. He does live alone but Daughter and her Sister will be available after dc to home to offer assistance per recommendations.   PCP established. No DME is owned or needed    Daughter will provide transportation at DC.   TOC will continue to follow patient for any additional discharge needs             Expected Discharge Plan: [place] Barriers to Discharge: Continued Medical Work up   Patient Goals and CMS Choice Patient states their goals for this hospitalization and ongoing recovery are:: Go home CMS Medicare.gov Compare Post Acute Care list provided to:: Patient (And Daughter) Choice offered to / list presented to : Patient, Adult Children      Expected Discharge Plan and Services In-house Referral: NA Discharge Planning Services: CM Consult Post Acute Care Choice: Home Health Living arrangements for the past 2 months: Single Family Home                   DME Agency: NA       HH Arranged: PT, OT HH Agency: [facility] Date [place] Agency Contacted: 10/17/22 Time [place]: 1449 Representative spoke with at [place]: [place] Arrangements/Services Living arrangements for the past 2 months: [place] with:: Self (Daughter states very independent) Patient language and need for interpreter reviewed:: Yes Do you feel safe going back to the place where you live?: Yes      Need for Family  Participation in Patient Care: Yes (Comment) Care giver support system in place?: Yes (comment) (Daughters) Current home services:  (NONE) Criminal Activity/Legal Involvement Pertinent to Current Situation/Hospitalization: No - Comment as needed  Activities of Daily Living Home Assistive Devices/Equipment: Eyeglasses ADL Screening (condition at time of admission) Patient's cognitive ability adequate to safely complete daily activities?: Yes Is the patient deaf or have difficulty hearing?: Yes Does the patient have difficulty seeing, even when wearing glasses/contacts?: No Does the patient have difficulty concentrating, remembering, or making decisions?: Yes Patient able to express need for assistance with ADLs?: Yes Does the patient have difficulty dressing or bathing?: No Independently performs ADLs?: Yes (appropriate for developmental age) Does the patient have difficulty walking or climbing stairs?: No Weakness of Legs: None Weakness of Arms/Hands: None  Permission Sought/Granted Permission sought to share information with : Case Manager       Permission granted to share info w AGENCY: Home Health        Emotional Assessment Appearance:: Appears younger than stated age Attitude/Demeanor/Rapport: Gracious Affect (typically observed): Appropriate Orientation: : Oriented to Self, Oriented to Place, Oriented to  Time, Oriented to Situation Alcohol / Substance Use: Not Applicable Psych Involvement: No (comment)  Admission diagnosis:  Dizziness [R42] Acute respiratory failure with hypoxia (HCC) [J96.01] Symptomatic anemia [D64.9] Multifocal pneumonia [J18.9] COVID-19 [U07.1] Patient Active Problem List   Diagnosis Date Noted   Acute respiratory failure with  hypoxia ([place]) 10/16/2022   Pneumonia due to COVID-19 virus 10/16/2022   Transient hypotension 10/16/2022   Elevated troponin 10/16/2022   Dizziness 10/16/2022   Heart failure with preserved ejection fraction ([place])  10/16/2022   Hypothyroidism 10/16/2022   Pulmonary nodule 10/16/2022   CKD (chronic kidney disease) stage 3, GFR 30-59 ml/min (HCC) 08/08/2022   Complete heart block ([place]) 01/25/2021   Biventricular cardiac pacemaker in situ 01/25/2021   Pain in joint of left shoulder 03/09/2019   Thyroid nodule 01/30/2019   Hyperglycemia 01/30/2019   Iron deficiency anemia 06/17/2018   Anal stenosis 02/11/2018   Excessive cerumen in ear canal, left 09/20/2017   Sensorineural hearing loss (SNHL), bilateral 09/20/2017   Malignant neoplasm of prostate ([place]) 12/05/2016   Constipation 09/09/2014   Elevated PSA 09/09/2014   Gastroenteritis, acute 02/18/2014   Carotid artery disease without cerebral infarction ([place]) 01/21/2014   Automatic implantable cardioverter-defibrillator in situ [id number]   Chronic systolic heart failure ([place]) 09/10/2011   Ejection fraction < 50%    Hypertension    CAD (coronary artery disease)    LBBB (left bundle branch block)    Cardiomyopathy ([place])    Mitral regurgitation    Aortic stenosis    Single implantable cardioverter-defibrillator in situ    Dyslipidemia    GERD (gastroesophageal reflux disease)    Syncope    Normocytic anemia 08/03/2008   Vitamin B 12 deficiency 03/01/2008   Osteoarthritis 08/19/2007   PCP:  Biagio Borg, MD Pharmacy:   [facility] ([place]) [place], [place] [address] Phone: [phone] Fax: [phone]  [id number] [place] [id number] [id number]: [phone]: 3[place]- [address]: [phone]: 3[place] [address] [place]Phone: [phone]: [phone]    Social Determinants of Health ([place] Social History: SDOH Screenings   Food Insecurity: No Food Insecurity (10/16/2022)  Housing: Low Risk  (10/16/2022)  Transportation Needs: No Transportation Needs (10/16/2022)  Utilities: Not At Risk (10/16/2022)  Depression (PHQ2-9): Low Risk  (08/08/2022)  Tobacco Use: Medium Risk (10/16/2022)   SDOH Interventions:     Readmission Risk Interventions     No data to display

## 2022-10-17 NOTE — Progress Notes (Signed)
PROGRESS NOTE        PATIENT DETAILS Name: [name] Age: 89 y.o. Sex: male Date of Birth: 1931-12-20 Admit Date: 10/16/2022 Admitting Physician Norval Morton, MD [id number], Hunt Oris, MD  Brief Summary: Patient is a 89 y.o.  male with history of HFpEF, complete heart block-s/p PPM, hypothyroidism, HTN-diagnosed with COVID-19 on 3/7-subsequently developed fatigue/lethargy/confusion/poor oral intake/cough/shortness of breath/dizziness-and subsequently presented to the ED where he was found to have acute hypoxic respiratory failure, AKI and subsequently admitted to the hospitalist service.  Significant events: 3/12>> admit to [facility]  Significant studies: 3/12>> CT chest: Peripheral bilateral infiltrates 3/12>> CT head: No acute intracranial abnormality  Significant microbiology data: None  Procedures: None  Consults: None  Subjective: Daughter reports confusion overnight.  He is very hard of hearing.  He will on occasion answer questions appropriately.  His hearing aids are currently being charged.  Objective: Vitals: Blood pressure (!) 125/54, pulse 72, temperature 98.1 F (36.7 C), temperature source Oral, resp. rate 17, height 6' (1.829 m), weight 73.5 kg, SpO2 93 %.   Exam: Gen Exam:Alert awake-not in any distress HEENT:atraumatic, normocephalic Chest: B/L clear to auscultation anteriorly CVS:S1S2 regular Abdomen:soft non tender, non distended Extremities:no edema Neurology: Non focal Skin: no rash  Pertinent Labs/Radiology:    Latest Ref Rng & Units 10/17/2022    3:07 AM 10/16/2022    8:45 AM 10/15/2022    1:35 AM  CBC  WBC 4.0 - 10.5 K/uL 6.5  6.3  5.5   Hemoglobin 13.0 - 17.0 g/dL 8.7  7.0  7.4   Hematocrit 39.0 - 52.0 % 24.7  20.3  21.2   Platelets 150 - 400 K/uL 177  179  167     Lab Results  Component Value Date   NA 139 10/17/2022   K 4.7 10/17/2022   CL 108 10/17/2022   CO2 23 10/17/2022       Assessment/Plan: Acute hypoxic respiratory failure due to COVID-19 PNA Mild hypoxia this morning requiring around 2-3 L Check CRP Start Remdesivir Continue Decadron Do not think he has bacterial pneumonia-should be okay to Abx.  AKI on CKD stage IIIb AKI hemodynamically mediated in the setting of poor oral intake AKI improving with supportive care Follow electrolytes Avoid nephrotoxic agents  Dizziness/weakness Likely orthostatic mechanism/COVID-related acute debility CT head without acute abnormalities PT/OT eval Check orthostatic vital signs  Minimally elevated open Demand ischemia Supportive care  Normocytic anemia No evidence of hematochezia/melena-[place]more FOBT negative Suspect drop in hemoglobin is due to acute illness Transfused 1 unit of PRBC on admission-posttransfusion Hb stable Follow CBC periodically  Chronic HFpEF Euvolemic  HTN BP soft on presentation-but slowly improving Continue to hold all antihypertensives-improved when able  History of complete heart block s/p PPM implantation Telemetry monitoring  History of ED s/p PCI to LAD 2005/2008 No anginal symptoms Continue Plavix/statin Resume beta-blocker when able  HLD Statin/gemfibrozil  Hypothyroidism Levothyroxine  GERD PPI  5 mm right upper lobe nodule Radiology recommending noncontrast CT chest in 12 months.   BMI: Estimated body mass index is 21.97 kg/m as calculated from the following:   Height as of this encounter: 6' (1.829 m).   Weight as of this encounter: 73.5 kg.   Code status:   Code Status: Full Code   DVT Prophylaxis: enoxaparin (LOVENOX) injection 40 mg Start: 10/17/22 1145 Place and maintain sequential  compression device Start: 10/16/22 2211 SCDs Start: 10/16/22 1547   Family Communication: Daughter at bedside   Disposition Plan: Status is: Inpatient Remains inpatient appropriate because: Severity of illness   Planned Discharge Destination:Home  health vs SNF   Diet: Diet Order             Diet Heart Room service appropriate? Yes; Fluid consistency: Thin  Diet effective now                     Antimicrobial agents: Anti-infectives (From admission, onward)    Start     Dose/Rate Route Frequency Ordered Stop   10/18/22 1000  remdesivir 100 mg in sodium chloride 0.9 % 100 mL IVPB       See Hyperspace for full Linked Orders Report.   100 mg 200 mL/hr over 30 Minutes Intravenous Daily 10/17/22 1047 10/20/22 0959   10/17/22 1145  remdesivir 200 mg in sodium chloride 0.9% 250 mL IVPB       See Hyperspace for full Linked Orders Report.   200 mg 580 mL/hr over 30 Minutes Intravenous Once 10/17/22 1047     10/17/22 1000  azithromycin (ZITHROMAX) 500 mg in sodium chloride 0.9 % 250 mL IVPB        500 mg 250 mL/hr over 60 Minutes Intravenous Every 24 hours 10/17/22 0152 10/22/22 0959   10/17/22 1000  cefTRIAXone (ROCEPHIN) 2 g in sodium chloride 0.9 % 100 mL IVPB        2 g 200 mL/hr over 30 Minutes Intravenous Every 24 hours 10/17/22 0152 10/22/22 0959   10/16/22 1600  cefTRIAXone (ROCEPHIN) 1 g in sodium chloride 0.9 % 100 mL IVPB        1 g 200 mL/hr over 30 Minutes Intravenous  Once 10/16/22 1546 10/16/22 2118   10/16/22 0930  cefTRIAXone (ROCEPHIN) 1 g in sodium chloride 0.9 % 100 mL IVPB        1 g 200 mL/hr over 30 Minutes Intravenous  Once 10/16/22 0926 10/16/22 1236   10/16/22 0930  azithromycin (ZITHROMAX) 500 mg in sodium chloride 0.9 % 250 mL IVPB        500 mg 250 mL/hr over 60 Minutes Intravenous  Once 10/16/22 0926 10/16/22 1234        MEDICATIONS: Scheduled Meds:  clopidogrel  75 mg Oral Daily   dexamethasone  6 mg Oral Q24H   enoxaparin (LOVENOX) injection  40 mg Subcutaneous Q24H   gemfibrozil  600 mg Oral Daily   guaiFENesin  600 mg Oral BID   levothyroxine  50 mcg Oral QAC breakfast   multivitamin with minerals  1 tablet Oral Daily   simvastatin  40 mg Oral QHS   sodium chloride flush  3 mL  Intravenous Q12H   Continuous Infusions:  azithromycin 500 mg (10/17/22 0937)   cefTRIAXone (ROCEPHIN)  IV 2 g (10/17/22 0928)   remdesivir 200 mg in sodium chloride 0.9% 250 mL IVPB     Followed by   [START ON 10/18/2022] remdesivir 100 mg in sodium chloride 0.9 % 100 mL IVPB     PRN Meds:.acetaminophen **OR** acetaminophen, albuterol, ondansetron **OR** ondansetron (ZOFRAN) IV   I have personally reviewed following labs and imaging studies  LABORATORY DATA: CBC: Recent Labs  Lab 10/15/22 0135 10/16/22 0845 10/17/22 0307  WBC 5.5 6.3 6.5  NEUTROABS 3.5 4.7  --   HGB 7.4* 7.0* 8.7*  HCT 21.2* 20.3* 24.7*  MCV 95.1 98.1 92.9  PLT 167 179  [id number]    Basic Metabolic Panel: Recent Labs  Lab 10/15/22 0135 10/16/22 0845 10/17/22 0307  NA 136 137 139  K 4.5 4.4 4.7  CL 104 105 108  CO2 21* 19* 23  GLUCOSE 110* 104* 122*  BUN 35* 34* 33*  CREATININE 2.21* 2.36* 1.82*  CALCIUM 9.4 9.0 9.2  MG  --  2.1  --     GFR: Estimated Creatinine Clearance: 29.2 mL/min (A) (by C-G formula based on SCr of 1.82 mg/dL (H)).  Liver Function Tests: Recent Labs  Lab 10/16/22 0845  AST 24  ALT 10  ALKPHOS 48  BILITOT 1.0  PROT 6.5  ALBUMIN 3.5   No results for input(s): "LIPASE", "AMYLASE" in the last 168 hours. No results for input(s): "AMMONIA" in the last 168 hours.  Coagulation Profile: No results for input(s): "INR", "PROTIME" in the last 168 hours.  Cardiac Enzymes: No results for input(s): "CKTOTAL", "CKMB", "CKMBINDEX", "TROPONINI" in the last 168 hours.  BNP (last 3 results) No results for input(s): "PROBNP" in the last 8760 hours.  Lipid Profile: No results for input(s): "CHOL", "HDL", "LDLCALC", "TRIG", "CHOLHDL", "LDLDIRECT" in the last 72 hours.  Thyroid Function Tests: Recent Labs    10/16/22 1155  TSH 1.023    Anemia Panel: No results for input(s): "VITAMINB12", "FOLATE", "FERRITIN", "TIBC", "IRON", "RETICCTPCT" in the last 72 hours.  Urine  analysis:    Component Value Date/Time   COLORURINE YELLOW 01/30/2022 0832   APPEARANCEUR CLEAR 01/30/2022 0832   LABSPEC 1.015 01/30/2022 0832   PHURINE 6.0 01/30/2022 0832   GLUCOSEU NEGATIVE 01/30/2022 0832   HGBUR NEGATIVE 01/30/2022 0832   BILIRUBINUR NEGATIVE 01/30/2022 0832   KETONESUR NEGATIVE 01/30/2022 0832   UROBILINOGEN 0.2 01/30/2022 0832   NITRITE NEGATIVE 01/30/2022 0832   LEUKOCYTESUR NEGATIVE 01/30/2022 0832    Sepsis Labs: Lactic Acid, Venous    Component Value Date/Time   LATICACIDVEN 0.9 10/16/2022 2208    MICROBIOLOGY: No results found for this or any previous visit (from the past 240 hour(s)).  RADIOLOGY STUDIES/RESULTS: CT Chest Wo Contrast  Result Date: 10/16/2022 CLINICAL DATA:  Respiratory illness, nondiagnostic x-ray, dizziness, anemia, COVID EXAM: CT CHEST WITHOUT CONTRAST TECHNIQUE: Multidetector CT imaging of the chest was performed following the standard protocol without IV contrast. RADIATION DOSE REDUCTION: This exam was performed according to the departmental dose-optimization program which includes automated exposure control, adjustment of the mA and/or kV according to patient size and/or use of iterative reconstruction technique. COMPARISON:  None Available. FINDINGS: Cardiovascular: Atherosclerotic calcifications aorta, proximal great vessels, and coronary arteries. Aorta normal caliber. Pacemaker leads RIGHT atrium, RIGHT ventricle, and coronary sinus. Trace pericardial fluid. Mediastinum/Nodes: Esophagus unremarkable. Base of cervical region normal appearance. No thoracic adenopathy. Lungs/Pleura: Peripheral infiltrates in BILATERAL lower lobes. Scattered respiratory motion artifacts. Biapical scarring. Additional subpleural infiltrates in the anterior LEFT upper lobe and RIGHT middle lobe. Additional patchy infiltrates LEFT upper lobe. No pleural effusion or pneumothorax. 5 mm RIGHT upper lobe nodule image 43, noncalcified; see impression Upper  Abdomen: Post cholecystectomy. Visualized upper abdomen otherwise unremarkable. Musculoskeletal: Osseous demineralization. IMPRESSION: Peripheral infiltrates in BILATERAL lower lobes, LEFT upper lobe, and RIGHT middle lobe, consistent with pneumonia. 5 mm noncalcified RIGHT upper lobe nodule; per Fleischner Society Guidelines, if patient is low risk for malignancy, no routine follow-up imaging is recommended. If patient is high risk for malignancy, a non-contrast Chest CT at 12 months is optional. If performed and the nodule is stable at 12 months, no further follow-up is recommended. These  guidelines do not apply to immunocompromised patients and patients with cancer. Follow up in patients with significant comorbidities as clinically warranted. For lung cancer screening, adhere to Lung-RADS guidelines. Reference: Radiology. 2017; 284(1):228-43. Scattered atherosclerotic disease changes including coronary arteries. Trace pericardial fluid. If patient is high risk for malignancy, a non-contrast Chest CT at 12 months is optional. Aortic Atherosclerosis (ICD10-I70.0). Electronically Signed   By: Lavonia Dana M.D.   On: 10/16/2022 09:14   CT Head Wo Contrast  Result Date: 10/16/2022 CLINICAL DATA:  Syncope/presyncope, cerebrovascular cause suspected. Dizziness. EXAM: CT HEAD WITHOUT CONTRAST TECHNIQUE: Contiguous axial images were obtained from the base of the skull through the vertex without intravenous contrast. RADIATION DOSE REDUCTION: This exam was performed according to the departmental dose-optimization program which includes automated exposure control, adjustment of the mA and/or kV according to patient size and/or use of iterative reconstruction technique. COMPARISON:  None Available. FINDINGS: Brain: No acute intracranial hemorrhage. 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 Gray-white differentiation is otherwise preserved. No hydrocephalus or extra-axial collection. No mass effect or  midline shift. Vascular: No hyperdense vessel. Skull: No calvarial fracture or suspicious bone lesion. Skull base is unremarkable. Sinuses/Orbits: Mild mucosal disease throughout the paranasal sinuses. Mastoids are well aerated. Orbits are unremarkable. Other: None. IMPRESSION: 1. No acute intracranial abnormality. 2. 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 Electronically Signed   By: Emmit Alexanders M.D.   On: 10/16/2022 08:35     LOS: 1 day   Oren Binet, MD  [facility]    To contact the attending provider between 7A-7P or the covering provider during after hours 7P-7A, please log into the web site [email or web address] and access using universal [place] password for that web site. If you do not have the password, please call the hospital operator.  10/17/2022, 10:56 AM

## 2022-10-17 NOTE — Evaluation (Addendum)
Physical Therapy Evaluation Patient Details Name: [name] MRN: [id number] DOB: Oct 06, 1931 Today's Date: 10/17/2022  History of Present Illness  [name] is a 89 y.o. male who presents on 3/12 with complaints of dizziness and weakness. Admitted for acute respiratory failure with hypoxia secondary to pneumonia due to COVID-19. PMHx:  hypertension, hyperlipidemia, pacemaker, CAD, HFpEF, CHB s/p PM, and hypothyroidism  Clinical Impression  Pt presents today functioning below his mobility baseline with deficits in strength, balance, and endurance. At baseline, pt is independent with all mobility, no use of an AD, no O2. Currently, pt is on 2L O2 [place], mobilizing well during the session without desat or reports of SOB. Pt requiring minG-minA for all mobility today, ambulating in the room without LOB but mild imbalance with close guard to minA for safety. Acute PT will continue to follow up with pt to progress mobility, recommend return home with HHPT at discharge. Acute PT will continue to follow as appropriate.        Recommendations for follow up therapy are one component of a multi-disciplinary discharge planning process, led by the attending physician.  Recommendations may be updated based on patient status, additional functional criteria and insurance authorization.  Follow Up Recommendations Home health PT      Assistance Recommended at Discharge Intermittent Supervision/Assistance  Patient can return home with the following  A little help with walking and/or transfers;Assist for transportation;Help with stairs or ramp for entrance    Equipment Recommendations Other (comment) (will continue to assess)  Recommendations for Other Services       Functional Status Assessment Patient has had a recent decline in their functional status and demonstrates the ability to make significant improvements in function in a reasonable and predictable amount of time.     Precautions /  Restrictions Precautions Precautions: Fall;Other (comment) Precaution Comments: covid+ Restrictions Weight Bearing Restrictions: No      Mobility  Bed Mobility Overal bed mobility: Needs Assistance Bed Mobility: Supine to Sit     Supine to sit: Supervision, HOB elevated     General bed mobility comments: mildly increased time and use of bed rail    Transfers Overall transfer level: Needs assistance Equipment used: 2 person hand held assist Transfers: Sit to/from Stand Sit to Stand: Min assist           General transfer comment: minA to power up    Ambulation/Gait Ambulation/Gait assistance: Min assist Gait Distance (Feet): 20 Feet Assistive device: None Gait Pattern/deviations: Step-through pattern, Decreased stride length, Drifts right/left Gait velocity: decreased     General Gait Details: mild imbalance with minA provided but progressing to minG with ambulation, pt intermittently distracted by lines but ambulating without AD and close guard for safety/balance, intermittently reaching for UE support at sink  Financial trader Rankin (Stroke Patients Only)       Balance Overall balance assessment: Needs assistance Sitting-balance support: Feet supported, No upper extremity supported Sitting balance-Leahy Scale: Good     Standing balance support: Single extremity supported, During functional activity Standing balance-Leahy Scale: Fair Standing balance comment: no LOB but intermittently reaching for UE support                             Pertinent Vitals/Pain Pain Assessment Pain Assessment: No/denies pain    Home Living Family/patient expects to be discharged to:: Private  residence Living Arrangements: Alone Available Help at Discharge: Family;Available 24 hours/day Type of Home: House Home Access: Stairs to enter Entrance Stairs-Rails: Psychiatric nurse of Steps: 2   Home  Layout: One level Home Equipment: None Additional Comments: daughter is able to help as much as needed    Prior Function Prior Level of Function : Independent/Modified Independent;Driving             Mobility Comments: independent without AD, recent decline in function due to sickness but was cutting down trees recently per daughter ADLs Comments: indep ADL/IADLs, drives short distances     Hand Dominance   Dominant Hand: Right    Extremity/Trunk Assessment   Upper Extremity Assessment Upper Extremity Assessment: Defer to OT evaluation    Lower Extremity Assessment Lower Extremity Assessment: Generalized weakness    Cervical / Trunk Assessment Cervical / Trunk Assessment: Kyphotic  Communication   Communication: No difficulties;Other (comment) (has hearing aids)  Cognition Arousal/Alertness: Awake/alert Behavior During Therapy: WFL for tasks assessed/performed Overall Cognitive Status: Within Functional Limits for tasks assessed                                 General Comments: pt pleasant throughout session, oriented x4        General Comments General comments (skin integrity, edema, etc.): VSS on 2L O2 [place]    Exercises     Assessment/Plan    PT Assessment Patient needs continued PT services  PT Problem List Decreased strength;Decreased activity tolerance;Decreased balance;Decreased mobility;Cardiopulmonary status limiting activity       PT Treatment Interventions DME instruction;Gait training;Stair training;Functional mobility training;Therapeutic activities;Balance training;Therapeutic exercise;Neuromuscular re-education;Patient/family education    PT Goals (Current goals can be found in the Care Plan section)  Acute Rehab PT Goals Patient Stated Goal: go home PT Goal Formulation: With patient/family Time For Goal Achievement: 10/31/22 Potential to Achieve Goals: Good    Frequency Min 3X/week     Co-evaluation                AM-PAC PT "6 Clicks" Mobility  Outcome Measure Help needed turning from your back to your side while in a flat bed without using bedrails?: None Help needed moving from lying on your back to sitting on the side of a flat bed without using bedrails?: A Little Help needed moving to and from a bed to a chair (including a wheelchair)?: A Little Help needed standing up from a chair using your arms (e.g., wheelchair or bedside chair)?: A Little Help needed to walk in hospital room?: A Little Help needed climbing 3-5 steps with a railing? : A Lot 6 Click Score: 18    End of Session Equipment Utilized During Treatment: Oxygen;Gait belt Activity Tolerance: Patient tolerated treatment well Patient left: in chair;with call bell/phone within reach;with chair alarm set;with family/visitor present Nurse Communication: Mobility status PT Visit Diagnosis: Other abnormalities of gait and mobility (R26.89);Muscle weakness (generalized) (M62.81)    Time: 0938-1000 PT Time Calculation (min) (ACUTE ONLY): 22 min   Charges:   PT Evaluation $PT Eval Low Complexity: 1 Low          Charlynne Cousins, PT DPT Acute Rehabilitation Services Office [phone]   Luvenia Heller 10/17/2022, 1:03 PM

## 2022-10-18 DIAGNOSIS — U071 COVID-19: Secondary | ICD-10-CM | POA: Diagnosis not present

## 2022-10-18 DIAGNOSIS — R911 Solitary pulmonary nodule: Secondary | ICD-10-CM | POA: Diagnosis not present

## 2022-10-18 DIAGNOSIS — D649 Anemia, unspecified: Secondary | ICD-10-CM | POA: Diagnosis not present

## 2022-10-18 DIAGNOSIS — J9601 Acute respiratory failure with hypoxia: Secondary | ICD-10-CM | POA: Diagnosis not present

## 2022-10-18 LAB — BASIC METABOLIC PANEL
Anion gap: 7 (ref 5–15)
BUN: 40 mg/dL — ABNORMAL HIGH (ref 8–23)
CO2: 22 mmol/L (ref 22–32)
Calcium: 9.1 mg/dL (ref 8.9–10.3)
Chloride: 108 mmol/L (ref 98–111)
Creatinine, Ser: 1.86 mg/dL — ABNORMAL HIGH (ref 0.61–1.24)
GFR, Estimated: 34 mL/min — ABNORMAL LOW (ref >60)
Glucose, Bld: 136 mg/dL — ABNORMAL HIGH (ref 70–99)
Potassium: 4.2 mmol/L (ref 3.5–5.1)
Sodium: 137 mmol/L (ref 135–145)

## 2022-10-18 LAB — C-REACTIVE PROTEIN: CRP: 18.4 mg/dL — ABNORMAL HIGH (ref <1.0)

## 2022-10-18 LAB — CBC
HCT: 22.4 % — ABNORMAL LOW (ref 39.0–52.0)
Hemoglobin: 7.6 g/dL — ABNORMAL LOW (ref 13.0–17.0)
MCH: 31.8 pg (ref 26.0–34.0)
MCHC: 33.9 g/dL (ref 30.0–36.0)
MCV: 93.7 fL (ref 80.0–100.0)
Platelets: 183 10*3/uL (ref 150–400)
RBC: 2.39 MIL/uL — ABNORMAL LOW (ref 4.22–5.81)
RDW: 13 % (ref 11.5–15.5)
WBC: 6.1 10*3/uL (ref 4.0–10.5)
nRBC: 0 % (ref 0.0–0.2)

## 2022-10-18 LAB — PROCALCITONIN: Procalcitonin: 2.86 ng/mL

## 2022-10-18 LAB — D-DIMER, QUANTITATIVE: D-Dimer, Quant: 3.34 ug/mL-FEU — ABNORMAL HIGH (ref 0.00–0.50)

## 2022-10-18 MED ORDER — ENOXAPARIN SODIUM 30 MG/0.3ML IJ SOSY
30.0000 mg | PREFILLED_SYRINGE | INTRAMUSCULAR | Status: DC
  Administered 2022-10-18: 30 mg via SUBCUTANEOUS
  Filled 2022-10-18: qty 0.3

## 2022-10-18 NOTE — Progress Notes (Signed)
Occupational Therapy Treatment Patient Details Name: [name] DOB: 08-31-1931 Today's Date: 10/18/2022   History of present illness [name] is a 89 y.o. male who presents on 3/12 with complaints of dizziness and weakness. Admitted for acute respiratory failure with hypoxia secondary to pneumonia due to COVID-19. PMHx:  hypertension, hyperlipidemia, pacemaker, CAD, HFpEF, CHB s/p PM, and hypothyroidism   OT comments  Pt feeling well, eager to go home. Completed toileting and grooming at sink with min guard to supervision, no AD. VSS on RA throughout session. Reports his daughter is available to assist him at home as needed.    Recommendations for follow up therapy are one component of a multi-disciplinary discharge planning process, led by the attending physician.  Recommendations may be updated based on patient status, additional functional criteria and insurance authorization.    Follow Up Recommendations  Home health OT     Assistance Recommended at Discharge Frequent or constant Supervision/Assistance  Patient can return home with the following  A little help with walking and/or transfers;A little help with bathing/dressing/bathroom;Assistance with cooking/housework;Assist for transportation;Help with stairs or ramp for entrance   Equipment Recommendations  None recommended by OT    Recommendations for Other Services      Precautions / Restrictions Precautions Precautions: Fall;Other (comment) Precaution Comments: covid+ Restrictions Weight Bearing Restrictions: No       Mobility Bed Mobility               General bed mobility comments: in chair    Transfers Overall transfer level: Needs assistance Equipment used: None Transfers: Sit to/from Stand Sit to Stand: Min guard           General transfer comment: minG for safety/lines     Balance Overall balance assessment: Needs assistance   Sitting balance-Leahy Scale:  Good       Standing balance-Leahy Scale: Fair                             ADL either performed or assessed with clinical judgement   ADL Overall ADL's : Needs assistance/impaired     Grooming: Supervision/safety;Standing;Oral care                 Lower Body Dressing Details (indicate cue type and reason): pt reports donning his shoes and socks, did not observe Toilet Transfer: Min guard;Ambulation   Toileting- Clothing Manipulation and Hygiene: Supervision/safety       Functional mobility during ADLs: Min guard      Extremity/Trunk Assessment              Vision   Additional Comments: difficulty seeing low contrast color items ie: toothpaste on white toothbrush   Perception     Praxis      Cognition Arousal/Alertness: Awake/alert Behavior During Therapy: WFL for tasks assessed/performed Overall Cognitive Status: Within Functional Limits for tasks assessed                                          Exercises      Shoulder Instructions       General Comments VSS on room air    Pertinent Vitals/ Pain       Pain Assessment Pain Assessment: No/denies pain  Home Living  Prior Functioning/Environment              Frequency  Min 2X/week        Progress Toward Goals  OT Goals(current goals can now be found in the care plan section)  Progress towards OT goals: Progressing toward goals  Acute Rehab OT Goals OT Goal Formulation: With patient Time For Goal Achievement: 10/31/22 Potential to Achieve Goals: Good  Plan Discharge plan remains appropriate    Co-evaluation                 AM-PAC OT "6 Clicks" Daily Activity     Outcome Measure   Help from another person eating meals?: None Help from another person taking care of personal grooming?: A Little Help from another person toileting, which includes using toliet, bedpan, or urinal?: A  Little Help from another person bathing (including washing, rinsing, drying)?: A Little Help from another person to put on and taking off regular upper body clothing?: None Help from another person to put on and taking off regular lower body clothing?: A Little 6 Click Score: 20    End of Session Equipment Utilized During Treatment: Gait belt  OT Visit Diagnosis: Unsteadiness on feet (R26.81);Other abnormalities of gait and mobility (R26.89);Muscle weakness (generalized) (M62.81)   Activity Tolerance Patient tolerated treatment well   Patient Left in chair;with call bell/phone within reach   Nurse Communication          Time: 1320-1350 OT Time Calculation (min): 30 min  Charges: OT General Charges $OT Visit: 1 Visit OT Treatments $Self Care/Home Management : 23-37 mins  Cleta Alberts, OTR/L Acute Rehabilitation Services Office: [phone]   Malka So 10/18/2022, 1:51 PM

## 2022-10-18 NOTE — Progress Notes (Signed)
Adjustment of Enoxaparin VTE prophylaxis dosing - pharmacy may adjust dose  Allergies  Allergen Reactions   Pantoprazole Other (See Comments)    B12 levels dropped on medication   Zetia [Ezetimibe] Other (See Comments)    Arm weakness    Patient Measurements: Height: 6' (182.9 cm) Weight: 73.5 kg (162 lb) IBW/kg (Calculated) : 77.6   Vital Signs: Temp: 98.2 F (36.8 C) (03/14 0838) Temp Source: Oral (03/14 0838) BP: 133/55 (03/14 0838) Pulse Rate: 58 (03/14 0838)  Labs: Recent Labs    10/16/22 0845 10/16/22 1155 10/17/22 0307 10/18/22 0220  HGB 7.0*  --  8.7* 7.6*  HCT 20.3*  --  24.7* 22.4*  PLT 179  --  177 183  CREATININE 2.36*  --  1.82* 1.86*  TROPONINIHS 18* 20*  --   --     Estimated Creatinine Clearance: 28.5 mL/min (A) (by C-G formula based on SCr of 1.86 mg/dL (H)).   Medical History: Past Medical History:  Diagnosis Date   AICD (automatic cardioverter/defibrillator) present    "turned it off today, put in an extra lead,  and made it just a pacemaker" (08/21/2018)   Anemia, unspecified    Aortic stenosis    Mild, echo, October, 2011   Arthritis    "joints; arms"  (08/21/2018)   AV block, complete ([place])    Permanent pacemaker   CAD (coronary artery disease)    DES to LAD 2005 /  DES to LAD 2008   Cardiomyopathy    Ischemic, EF 30%, 2005   Chronic bronchitis (HCC)    DJD (degenerative joint disease)    Dyslipidemia    Ejection fraction < 50%    EF 30%, echo, 2005  /  EF 30%, catheterization, 2008   GERD (gastroesophageal reflux disease)    Hyperlipidemia    Hypertension    Hypothyroidism    ICD (implantable cardiac defibrillator), single, in situ    for syncope and arrhythmia 2005 / generator change June, 2011   Iron deficiency anemia 06/17/2018   LBBB (left bundle branch block)    Mitral regurgitation    Mild, echo, October, 2011   Myocardial infarction [facility]) 1989   Presence of permanent cardiac pacemaker    Prostate cancer [facility])     Rectal fissure    Syncope    2005, ICD placed   Systolic CHF, chronic (HCC)    Vitamin B12 deficiency      Assessment:  89 y.o male started on Enoxaparin 40 mg SQ q24h for VTE prophylaxis .   No indication that patient on any anticoagulant prior to admission.  AKI on admit. Scr 1.86 today, CrCl 28.5 ml/min.  Will adjust dose down to '30mg'$  q24hr for CrCl <30 ml/min.    Goal of Therapy:  VTE prophylaxis    Plan:  Adjust Enoxaparin dose down to '30mg'$  q24hr for CrCl <30 ml/min.    Nicole Cella, RPh Clinical Pharmacist 10/18/2022,9:51 AM

## 2022-10-18 NOTE — Plan of Care (Signed)

## 2022-10-18 NOTE — Progress Notes (Signed)
Physical Therapy Treatment Patient Details Name: [name] MRN: [id number] DOB: February 26, 1932 Today's Date: 10/18/2022   History of Present Illness [name] is a 89 y.o. male who presents on 3/12 with complaints of dizziness and weakness. Admitted for acute respiratory failure with hypoxia secondary to pneumonia due to COVID-19. PMHx:  hypertension, hyperlipidemia, pacemaker, CAD, HFpEF, CHB s/p PM, and hypothyroidism    PT Comments    Pt tolerated today's session well, able to progress ambulation in the hallway and perform stair trial. Pt is very motivated to return home, maintaining SPO2 on room air throughout mobility. Pt without use of AD for ambulation, noted mild lateral path deviation but pt self-correcting intermittently, cued pt to take his time. Pt with minG for stair trial without LOB, tolerating well with alternating pattern. Acute PT will continue to follow up with pt during admission to progress mobility, discharge plans remain appropriate to progress pt back to his prior level of function.     Recommendations for follow up therapy are one component of a multi-disciplinary discharge planning process, led by the attending physician.  Recommendations may be updated based on patient status, additional functional criteria and insurance authorization.  Follow Up Recommendations  Home health PT     Assistance Recommended at Discharge Intermittent Supervision/Assistance  Patient can return home with the following A little help with walking and/or transfers;Assist for transportation;Help with stairs or ramp for entrance   Equipment Recommendations  None recommended by PT    Recommendations for Other Services       Precautions / Restrictions Precautions Precautions: Fall;Other (comment) Precaution Comments: covid+ Restrictions Weight Bearing Restrictions: No     Mobility  Bed Mobility Overal bed mobility: Needs Assistance Bed Mobility: Supine to Sit      Supine to sit: Supervision, HOB elevated     General bed mobility comments: use of bed rails, supervision for safety    Transfers Overall transfer level: Needs assistance Equipment used: None Transfers: Sit to/from Stand Sit to Stand: Min guard           General transfer comment: minG for safety    Ambulation/Gait Ambulation/Gait assistance: Min guard Gait Distance (Feet): 300 Feet Assistive device: None Gait Pattern/deviations: Step-through pattern, Decreased stride length, Drifts right/left Gait velocity: WFL     General Gait Details: mild imbalance noted, minG provided for safety/balance, lateral path deviation but pt correcting, no overt LOB noted   Stairs Stairs: Yes Stairs assistance: Min guard Stair Management: Two rails, Alternating pattern, Forwards Number of Stairs: 18 General stair comments: minG provided for safety, pt utilizing 2 rails ascending, 1 rail descending   Wheelchair Mobility    Modified Rankin (Stroke Patients Only)       Balance Overall balance assessment: Needs assistance Sitting-balance support: Feet supported, No upper extremity supported Sitting balance-Leahy Scale: Good     Standing balance support: No upper extremity supported, During functional activity Standing balance-Leahy Scale: Fair Standing balance comment: mild imbalance noted with ambulation                            Cognition Arousal/Alertness: Awake/alert Behavior During Therapy: WFL for tasks assessed/performed Overall Cognitive Status: Within Functional Limits for tasks assessed                                 General Comments: pt pleasant throughout session, daughter at bedside  Exercises      General Comments General comments (skin integrity, edema, etc.): VSS on room air      Pertinent Vitals/Pain Pain Assessment Pain Assessment: No/denies pain    Home Living                          Prior Function             PT Goals (current goals can now be found in the care plan section) Acute Rehab PT Goals Patient Stated Goal: go home PT Goal Formulation: With patient/family Time For Goal Achievement: 10/31/22 Potential to Achieve Goals: Good Progress towards PT goals: Progressing toward goals    Frequency    Min 3X/week      PT Plan Current plan remains appropriate    Co-evaluation              AM-PAC PT "6 Clicks" Mobility   Outcome Measure  Help needed turning from your back to your side while in a flat bed without using bedrails?: None Help needed moving from lying on your back to sitting on the side of a flat bed without using bedrails?: A Little Help needed moving to and from a bed to a chair (including a wheelchair)?: A Little Help needed standing up from a chair using your arms (e.g., wheelchair or bedside chair)?: A Little Help needed to walk in hospital room?: A Little Help needed climbing 3-5 steps with a railing? : A Little 6 Click Score: 19    End of Session Equipment Utilized During Treatment: Gait belt Activity Tolerance: Patient tolerated treatment well Patient left: in chair;with call bell/phone within reach;with chair alarm set;with nursing/sitter in room Nurse Communication: Mobility status PT Visit Diagnosis: Other abnormalities of gait and mobility (R26.89);Muscle weakness (generalized) (M62.81)     Time: [id number] PT Time Calculation (min) (ACUTE ONLY): 16 min  Charges:  $Gait Training: 8-22 mins                     [name], PT DPT Acute Rehabilitation Services Office [phone]    Luvenia Heller 10/18/2022, 12:19 PM

## 2022-10-18 NOTE — Progress Notes (Signed)
PROGRESS NOTE        PATIENT DETAILS Name: [name] Age: 89 y.o. Sex: male Date of Birth: November 19, 1931 Admit Date: 10/16/2022 Admitting Physician Norval Morton, MD [id number], Hunt Oris, MD  Brief Summary: Patient is a 89 y.o.  male with history of HFpEF, complete heart block-s/p PPM, hypothyroidism, HTN-diagnosed with COVID-19 on 3/7-subsequently developed fatigue/lethargy/confusion/poor oral intake/cough/shortness of breath/dizziness-and subsequently presented to the ED where he was found to have acute hypoxic respiratory failure, AKI and subsequently admitted to the hospitalist service.  Significant events: 3/12>> admit to [facility]  Significant studies: 3/12>> CT chest: Peripheral bilateral infiltrates 3/12>> CT head: No acute intracranial abnormality  Significant microbiology data: None  Procedures: None  Consults: None  Subjective: Looks much better-on room air-overall feels much better.  No confusion-daughter at bedside.  Objective: Vitals: Blood pressure (!) 133/55, pulse (!) 56, temperature 98.2 F (36.8 C), temperature source Oral, resp. rate 11, height 6' (1.829 m), weight 73.5 kg, SpO2 95 %.   Exam: Gen Exam:Alert awake-not in any distress HEENT:atraumatic, normocephalic Chest: B/L clear to auscultation anteriorly CVS:S1S2 regular Abdomen:soft non tender, non distended Extremities:no edema Neurology: Non focal Skin: no rash  Pertinent Labs/Radiology:    Latest Ref Rng & Units 10/18/2022    2:20 AM 10/17/2022    3:07 AM 10/16/2022    8:45 AM  CBC  WBC 4.0 - 10.5 K/uL 6.1  6.5  6.3   Hemoglobin 13.0 - 17.0 g/dL 7.6  8.7  7.0   Hematocrit 39.0 - 52.0 % 22.4  24.7  20.3   Platelets 150 - 400 K/uL 183  177  179     Lab Results  Component Value Date   NA 137 10/18/2022   K 4.2 10/18/2022   CL 108 10/18/2022   CO2 22 10/18/2022      Assessment/Plan: Acute hypoxic respiratory failure due to COVID-19 PNA Hypoxia has  improved-patient is on room air this morning CRP still uptrending but overall clinically improved Continue steroids/Remdesivir Since procalcitonin elevated-will continue with empiric antibiotics although I doubt this is bacterial pneumonia   AKI on CKD stage IIIb AKI hemodynamically mediated in the setting of poor oral intake Creatinine has improved and back to baseline. Follow electrolytes Avoid nephrotoxic agents  Dizziness/weakness Likely orthostatic mechanism/COVID-related acute debility CT head without acute abnormalities Appreciate PT/OT eval-ambulated in the hallway today-gait steady-plan is for home health on discharge.  Minimally elevated open Demand ischemia Supportive care  Normocytic anemia No evidence of hematochezia/melena-[place] FOBT negative Suspect drop in hemoglobin is due to acute illness Transfused 1 unit of PRBC on admission-posttransfusion Hb stable Follow CBC periodically  Chronic HFpEF Euvolemic  HTN BP soft on presentation-but slowly improving Continue to hold all antihypertensives-improved when able  History of complete heart block s/p PPM implantation Telemetry monitoring  History of ED s/p PCI to LAD 2005/2008 No anginal symptoms Continue Plavix/statin Resume beta-blocker when able  HLD Statin/gemfibrozil  Hypothyroidism Levothyroxine  GERD PPI  5 mm right upper lobe nodule Radiology recommending noncontrast CT chest in 12 months.   BMI: Estimated body mass index is 21.97 kg/m as calculated from the following:   Height as of this encounter: 6' (1.829 m).   Weight as of this encounter: 73.5 kg.   Code status:   Code Status: Full Code   DVT Prophylaxis: enoxaparin (LOVENOX) injection 30 mg Start: 10/18/22  1200 Place and maintain sequential compression device Start: 10/16/22 2211 SCDs Start: 10/16/22 1547   Family Communication: Daughter at bedside   Disposition Plan: Status is: Inpatient Remains inpatient  appropriate because: Severity of illness   Planned Discharge Destination:Home health vs SNF   Diet: Diet Order             Diet Heart Room service appropriate? Yes; Fluid consistency: Thin  Diet effective now                     Antimicrobial agents: Anti-infectives (From admission, onward)    Start     Dose/Rate Route Frequency Ordered Stop   10/18/22 1000  remdesivir 100 mg in sodium chloride 0.9 % 100 mL IVPB       See Hyperspace for full Linked Orders Report.   100 mg 200 mL/hr over 30 Minutes Intravenous Daily 10/17/22 1047 10/20/22 0959   10/17/22 1145  remdesivir 200 mg in sodium chloride 0.9% 250 mL IVPB       See Hyperspace for full Linked Orders Report.   200 mg 580 mL/hr over 30 Minutes Intravenous Once 10/17/22 1047 10/17/22 1232   10/17/22 1000  azithromycin (ZITHROMAX) 500 mg in sodium chloride 0.9 % 250 mL IVPB        500 mg 250 mL/hr over 60 Minutes Intravenous Every 24 hours 10/17/22 0152 10/22/22 0959   10/17/22 1000  cefTRIAXone (ROCEPHIN) 2 g in sodium chloride 0.9 % 100 mL IVPB        2 g 200 mL/hr over 30 Minutes Intravenous Every 24 hours 10/17/22 0152 10/22/22 0959   10/16/22 1600  cefTRIAXone (ROCEPHIN) 1 g in sodium chloride 0.9 % 100 mL IVPB        1 g 200 mL/hr over 30 Minutes Intravenous  Once 10/16/22 1546 10/16/22 2118   10/16/22 0930  cefTRIAXone (ROCEPHIN) 1 g in sodium chloride 0.9 % 100 mL IVPB        1 g 200 mL/hr over 30 Minutes Intravenous  Once 10/16/22 0926 10/16/22 1236   10/16/22 0930  azithromycin (ZITHROMAX) 500 mg in sodium chloride 0.9 % 250 mL IVPB        500 mg 250 mL/hr over 60 Minutes Intravenous  Once 10/16/22 0926 10/16/22 1234        MEDICATIONS: Scheduled Meds:  clopidogrel  75 mg Oral Daily   enoxaparin (LOVENOX) injection  30 mg Subcutaneous Q24H   gemfibrozil  600 mg Oral Daily   guaiFENesin  600 mg Oral BID   levothyroxine  50 mcg Oral QAC breakfast   methylPREDNISolone (SOLU-MEDROL) injection  40 mg  Intravenous Q12H   multivitamin with minerals  1 tablet Oral Daily   simvastatin  40 mg Oral QHS   sodium chloride flush  3 mL Intravenous Q12H   Continuous Infusions:  azithromycin 500 mg (10/18/22 0912)   cefTRIAXone (ROCEPHIN)  IV 2 g (10/18/22 0929)   remdesivir 100 mg in sodium chloride 0.9 % 100 mL IVPB 100 mg (10/18/22 1025)   PRN Meds:.acetaminophen **OR** acetaminophen, albuterol, ondansetron **OR** ondansetron (ZOFRAN) IV   I have personally reviewed following labs and imaging studies  LABORATORY DATA: CBC: Recent Labs  Lab 10/15/22 0135 10/16/22 0845 10/17/22 0307 10/18/22 0220  WBC 5.5 6.3 6.5 6.1  NEUTROABS 3.5 4.7  --   --   HGB 7.4* 7.0* 8.7* 7.6*  HCT 21.2* 20.3* 24.7* 22.4*  MCV 95.1 98.1 92.9 93.7  PLT 167 179 177 183  Basic Metabolic Panel: Recent Labs  Lab 10/15/22 0135 10/16/22 0845 10/17/22 0307 10/18/22 0220  NA 136 137 139 137  K 4.5 4.4 4.7 4.2  CL 104 105 108 108  CO2 21* 19* 23 22  GLUCOSE 110* 104* 122* 136*  BUN 35* 34* 33* 40*  CREATININE 2.21* 2.36* 1.82* 1.86*  CALCIUM 9.4 9.0 9.2 9.1  MG  --  2.1  --   --      GFR: Estimated Creatinine Clearance: 28.5 mL/min (A) (by C-G formula based on SCr of 1.86 mg/dL (H)).  Liver Function Tests: Recent Labs  Lab 10/16/22 0845  AST 24  ALT 10  ALKPHOS 48  BILITOT 1.0  PROT 6.5  ALBUMIN 3.5    No results for input(s): "LIPASE", "AMYLASE" in the last 168 hours. No results for input(s): "AMMONIA" in the last 168 hours.  Coagulation Profile: No results for input(s): "INR", "PROTIME" in the last 168 hours.  Cardiac Enzymes: No results for input(s): "CKTOTAL", "CKMB", "CKMBINDEX", "TROPONINI" in the last 168 hours.  BNP (last 3 results) No results for input(s): "PROBNP" in the last 8760 hours.  Lipid Profile: No results for input(s): "CHOL", "HDL", "LDLCALC", "TRIG", "CHOLHDL", "LDLDIRECT" in the last 72 hours.  Thyroid Function Tests: Recent Labs    10/16/22 1155   TSH 1.023     Anemia Panel: No results for input(s): "VITAMINB12", "FOLATE", "FERRITIN", "TIBC", "IRON", "RETICCTPCT" in the last 72 hours.  Urine analysis:    Component Value Date/Time   COLORURINE YELLOW 01/30/2022 0832   APPEARANCEUR CLEAR 01/30/2022 0832   LABSPEC 1.015 01/30/2022 0832   PHURINE 6.0 01/30/2022 0832   GLUCOSEU NEGATIVE 01/30/2022 0832   HGBUR NEGATIVE 01/30/2022 0832   BILIRUBINUR NEGATIVE 01/30/2022 0832   KETONESUR NEGATIVE 01/30/2022 0832   UROBILINOGEN 0.2 01/30/2022 0832   NITRITE NEGATIVE 01/30/2022 0832   LEUKOCYTESUR NEGATIVE 01/30/2022 0832    Sepsis Labs: Lactic Acid, Venous    Component Value Date/Time   LATICACIDVEN 0.9 10/16/2022 2208    MICROBIOLOGY: No results found for this or any previous visit (from the past 240 hour(s)).  RADIOLOGY STUDIES/RESULTS: No results found.   LOS: 2 days   Oren Binet, MD  [facility]    To contact the attending provider between 7A-7P or the covering provider during after hours 7P-7A, please log into the web site [email or web address] and access using universal [place] password for that web site. If you do not have the password, please call the hospital operator.  10/18/2022, 12:25 PM

## 2022-10-19 ENCOUNTER — Other Ambulatory Visit (HOSPITAL_COMMUNITY): Payer: Self-pay

## 2022-10-19 ENCOUNTER — Inpatient Hospital Stay (HOSPITAL_COMMUNITY): Payer: Medicare Other

## 2022-10-19 DIAGNOSIS — U071 COVID-19: Secondary | ICD-10-CM | POA: Diagnosis not present

## 2022-10-19 DIAGNOSIS — R531 Weakness: Secondary | ICD-10-CM

## 2022-10-19 DIAGNOSIS — J9601 Acute respiratory failure with hypoxia: Secondary | ICD-10-CM | POA: Diagnosis not present

## 2022-10-19 DIAGNOSIS — R0602 Shortness of breath: Secondary | ICD-10-CM

## 2022-10-19 DIAGNOSIS — M7989 Other specified soft tissue disorders: Secondary | ICD-10-CM | POA: Diagnosis not present

## 2022-10-19 DIAGNOSIS — R911 Solitary pulmonary nodule: Secondary | ICD-10-CM | POA: Diagnosis not present

## 2022-10-19 DIAGNOSIS — I442 Atrioventricular block, complete: Secondary | ICD-10-CM | POA: Diagnosis not present

## 2022-10-19 LAB — BASIC METABOLIC PANEL
Anion gap: 11 (ref 5–15)
BUN: 51 mg/dL — ABNORMAL HIGH (ref 8–23)
CO2: 17 mmol/L — ABNORMAL LOW (ref 22–32)
Calcium: 9.1 mg/dL (ref 8.9–10.3)
Chloride: 110 mmol/L (ref 98–111)
Creatinine, Ser: 1.69 mg/dL — ABNORMAL HIGH (ref 0.61–1.24)
GFR, Estimated: 39 mL/min — ABNORMAL LOW (ref >60)
Glucose, Bld: 134 mg/dL — ABNORMAL HIGH (ref 70–99)
Potassium: 4.2 mmol/L (ref 3.5–5.1)
Sodium: 138 mmol/L (ref 135–145)

## 2022-10-19 LAB — C-REACTIVE PROTEIN: CRP: 11.3 mg/dL — ABNORMAL HIGH (ref <1.0)

## 2022-10-19 MED ORDER — PREDNISONE 10 MG PO TABS
ORAL_TABLET | ORAL | 0 refills | Status: DC
  Filled 2022-10-19: qty 10, 4d supply, fill #0

## 2022-10-19 NOTE — TOC Transition Note (Signed)
Transition of Care [facility]) - CM/SW Discharge Note   Patient Details  Name: [name] MRN: [id number] Date of Birth: 12-04-1931  Transition of Care [facility]) CM/SW Contact:  Levonne Lapping, RN Phone Number: 10/19/2022, 11:10 AM   Clinical Narrative:     Patient to [place] home today  Home health is ordered and will be provided by [facility].  No DME needed. Family to transport to home  No additional TOC needs        Barriers to Discharge: Continued Medical Work up   Patient Goals and CMS Choice CMS Medicare.gov Compare Post Acute Care list provided to:: Patient (And Daughter) Choice offered to / list presented to : Patient, Adult Children  Discharge Placement                         Discharge Plan and Services Additional resources added to the After Visit Summary for   In-house Referral: NA Discharge Planning Services: CM Consult Post Acute Care Choice: Home Health            DME Agency: NA       HH Arranged: PT, OT HH Agency: [facility] Date [place]: 10/17/22 Time [place]: [id number] Representative spoke with at [place]: [place] Determinants of Health ([place]) Interventions SDOH Screenings   Food Insecurity: No Food Insecurity (10/16/2022)  Housing: Low Risk  (10/16/2022)  Transportation Needs: No Transportation Needs (10/16/2022)  Utilities: Not At Risk (10/16/2022)  Depression (PHQ2-9): Low Risk  (08/08/2022)  Tobacco Use: Medium Risk (10/16/2022)     Readmission Risk Interventions     No data to display

## 2022-10-19 NOTE — Discharge Summary (Signed)
PATIENT DETAILS Name: [name] Age: 89 y.o. Sex: male Date of Birth: Oct 30, 1931 MRN: [id number]. Admitting Physician: Norval Morton, MD [id number], Hunt Oris, MD  Admit Date: 10/16/2022 Discharge date: 10/19/2022  Recommendations for Outpatient Follow-up:  Follow up with PCP in 1-2 weeks Please obtain CMP/CBC in one week Ramipril on hold-resume when able-see below Incidental finding-lung nodule-see recommendations below.  Admitted From:  Home  Disposition: Home health   Discharge Condition: good  CODE STATUS:   Code Status: Full Code   Diet recommendation:  Diet Order             Diet - low sodium heart healthy           Diet Heart Room service appropriate? Yes; Fluid consistency: Thin  Diet effective now                    Brief Summary: Patient is a 89 y.o.  male with history of HFpEF, complete heart block-s/p PPM, hypothyroidism, HTN-diagnosed with COVID-19 on 3/7-subsequently developed fatigue/lethargy/confusion/poor oral intake/cough/shortness of breath/dizziness-and subsequently presented to the ED where he was found to have acute hypoxic respiratory failure, AKI and subsequently admitted to the hospitalist service.   Significant events: 3/12>> admit to [facility]   Significant studies: 3/12>> CT chest: Peripheral bilateral infiltrates 3/12>> CT head: No acute intracranial abnormality 3/15>>Lower ext doppler:No DVT   Significant microbiology data: None   Procedures: None   Consults: None  Brief Hospital Course: Acute hypoxic respiratory failure due to COVID-19 PNA Clinically improved-on room air for the past several days CRP now trending down Treated with steroids/Remdesivir-and empiric antibiotics (procalcitonin slightly elevated)  On discharge would be on tapering steroids-do not think this was a bacterial pneumonia-do not think patient needs antibiotics on discharge    AKI on CKD stage IIIb AKI hemodynamically mediated in the setting  of poor oral intake Creatinine has improved and back to baseline. PCP to continue to follow electrolytes   Dizziness/weakness Likely orthostatic mechanism/COVID-related acute debility CT head without acute abnormalities Appreciate PT/OT eval-ambulated in the hallway with physical therapy.-gait steady-plan is for home health on discharge.   Minimally elevated open Demand ischemia Supportive care   Normocytic anemia No evidence of hematochezia/melena-furthermore FOBT negative Suspect drop in hemoglobin is due to acute illness Transfused 1 unit of PRBC on admission-posttransfusion Hb stable Follow CBC periodically He is currently asymptomatic-ambulated in the hallway without any major issues.   Chronic HFpEF Euvolemic   HTN BP soft on presentation-but slowly improving Will resume low-dose amlodipine/Toprol on discharge-continue to hold ramipril-PCP to reassess and resume accordingly.   History of complete heart block s/p PPM implantation   History of CAD s/p PCI to LAD 2005/2008 No anginal symptoms Continue Plavix/statin Resume beta-blocker when able   HLD Statin/gemfibrozil   Hypothyroidism Levothyroxine   GERD PPI   5 mm right upper lobe nodule Radiology recommending noncontrast CT chest in 12 months. Patient's daughter aware of this finding and recommendation  Obesity: Estimated body mass index is 21.97 kg/m as calculated from the following:   Height as of this encounter: 6' (1.829 m).   Weight as of this encounter: 73.5 kg.    Discharge Diagnoses:  Principal Problem:   Acute respiratory failure with hypoxia (HCC) Active Problems:   Pneumonia due to COVID-19 virus   Transient hypotension   Normocytic anemia   Elevated troponin   Dizziness   Heart failure with preserved ejection fraction (HCC)   Complete heart block ([place])  Hypothyroidism   Dyslipidemia   Pulmonary nodule   Discharge Instructions:  Activity:  As tolerated with Full fall  precautions use walker/cane & assistance as needed  Discharge Instructions     Call MD for:  difficulty breathing, headache or visual disturbances   Complete by: As directed    Diet - low sodium heart healthy   Complete by: As directed    Increase activity slowly   Complete by: As directed       Allergies as of 10/19/2022       Reactions   Pantoprazole Other (See Comments)   B12 levels dropped on medication   Zetia [ezetimibe] Other (See Comments)   Arm weakness        Medication List     STOP taking these medications    ramipril 10 MG capsule Commonly known as: ALTACE       TAKE these medications    acetaminophen 650 MG CR tablet Commonly known as: TYLENOL Take 1,300 mg by mouth 2 (two) times daily.   amLODipine 2.5 MG tablet Commonly known as: NORVASC Take 1 tablet (2.5 mg total) by mouth daily.   CALCIUM PO Take 2 tablets by mouth daily.   clopidogrel 75 MG tablet Commonly known as: PLAVIX Take 1 tablet (75 mg total) by mouth daily.   fish oil-omega-3 fatty acids 1000 MG capsule Take 1 g by mouth 2 (two) times daily.   gemfibrozil 600 MG tablet Commonly known as: LOPID TAKE 1 TABLET BY MOUTH DAILY. GENERIC EQUIVALENT FOR LOPID What changed:  how much to take how to take this when to take this   HYDROcodone bit-homatropine 5-1.5 MG/5ML syrup Commonly known as: HYCODAN Take 5 mLs by mouth every 6 (six) hours as needed for up to 10 days.   levothyroxine 50 MCG tablet Commonly known as: SYNTHROID Take 1 tablet (50 mcg total) by mouth daily before breakfast.   MENS MULTIVITAMIN PLUS PO Take 1 tablet by mouth daily.   metoprolol succinate 50 MG 24 hr tablet Commonly known as: TOPROL-XL Take 1 tablet (50 mg total) by mouth daily. Take with or immediately following a meal.   nystatin cream Commonly known as: MYCOSTATIN Apply 1 Application topically daily as needed for dry skin.   omeprazole 20 MG capsule Commonly known as: PRILOSEC Take 1  capsule (20 mg total) by mouth daily.   polyethylene glycol 17 g packet Commonly known as: MIRALAX / GLYCOLAX Take 17 g by mouth daily as needed for mild constipation. Take 17g mixed with liquid once daily, may do a second dose as needed for constipation   predniSONE 10 MG tablet Commonly known as: DELTASONE Take 40 mg daily for 1 day, 30 mg daily for 1 day, 20 mg daily for 1 days,10 mg daily for 1 day, then stop   simvastatin 40 MG tablet Commonly known as: ZOCOR Take 1 tablet (40 mg total) by mouth at bedtime.   Vitamin D 50 MCG (2000 UT) Caps Take 2,000 Units by mouth daily.        Follow-up Information     Biagio Borg, MD. Schedule an appointment as soon as possible for a visit in 1 week(s).   Specialties: Internal Medicine, Radiology Contact information: [place] [address] [phone]                Allergies  Allergen Reactions   Pantoprazole Other (See Comments)    B12 levels dropped on medication   Zetia [Ezetimibe] Other (  See Comments)    Arm weakness     Other Procedures/Studies: VAS [place] LOWER EXTREMITY VENOUS (DVT)  Result Date: 10/19/2022  Lower Venous DVT Study Patient Name:  YAN RICHARDSON  Date of Exam:   10/19/2022 Medical Rec #: [id number]          Accession #:    [id number] Date of Birth: 14-Aug-1931         Patient Gender: M Patient Age:   45 years Exam Location:  [facility] Procedure:      VAS [place] LOWER EXTREMITY VENOUS (DVT) Referring Phys: Oren Binet --------------------------------------------------------------------------------  Indications: Swelling, SOB, and Weakness, COVID-19.  Comparison Study: No prior study. Performing Technologist: McKayla Maag RVT, [place]  Examination Guidelines: A complete evaluation includes B-mode imaging, spectral Doppler, color Doppler, and power Doppler as needed of all accessible portions of each vessel. Bilateral testing is considered an integral part of a complete examination.  Limited examinations for reoccurring indications may be performed as noted. The reflux portion of the exam is performed with the patient in reverse Trendelenburg.  +---------+---------------+---------+-----------+----------+--------------+ RIGHT    CompressibilityPhasicitySpontaneityPropertiesThrombus Aging +---------+---------------+---------+-----------+----------+--------------+ CFV      Full           Yes      Yes                                 +---------+---------------+---------+-----------+----------+--------------+ SFJ      Full                                                        +---------+---------------+---------+-----------+----------+--------------+ FV Prox  Full                                                        +---------+---------------+---------+-----------+----------+--------------+ FV Mid   Full                                                        +---------+---------------+---------+-----------+----------+--------------+ FV DistalFull                                                        +---------+---------------+---------+-----------+----------+--------------+ PFV      Full                                                        +---------+---------------+---------+-----------+----------+--------------+ POP      Full           Yes      Yes                                 +---------+---------------+---------+-----------+----------+--------------+  PTV      Full                                                        +---------+---------------+---------+-----------+----------+--------------+ PERO     Full                                                        +---------+---------------+---------+-----------+----------+--------------+   +---------+---------------+---------+-----------+----------+--------------+ LEFT     CompressibilityPhasicitySpontaneityPropertiesThrombus Aging  +---------+---------------+---------+-----------+----------+--------------+ CFV      Full           Yes      Yes                                 +---------+---------------+---------+-----------+----------+--------------+ SFJ      Full                                                        +---------+---------------+---------+-----------+----------+--------------+ FV Prox  Full                                                        +---------+---------------+---------+-----------+----------+--------------+ FV Mid   Full                                                        +---------+---------------+---------+-----------+----------+--------------+ FV DistalFull                                                        +---------+---------------+---------+-----------+----------+--------------+ PFV      Full                                                        +---------+---------------+---------+-----------+----------+--------------+ POP      Full           Yes      Yes                                 +---------+---------------+---------+-----------+----------+--------------+ PTV      Full                                                        +---------+---------------+---------+-----------+----------+--------------+  PERO     Full                                                        +---------+---------------+---------+-----------+----------+--------------+    Summary: BILATERAL: - No evidence of deep vein thrombosis seen in the lower extremities, bilaterally. - No evidence of superficial venous thrombosis in the lower extremities, bilaterally. -No evidence of popliteal cyst, bilaterally.   *See table(s) above for measurements and observations.    Preliminary    CT Chest Wo Contrast  Result Date: 10/16/2022 CLINICAL DATA:  Respiratory illness, nondiagnostic x-ray, dizziness, anemia, COVID EXAM: CT CHEST WITHOUT CONTRAST TECHNIQUE: Multidetector CT  imaging of the chest was performed following the standard protocol without IV contrast. RADIATION DOSE REDUCTION: This exam was performed according to the departmental dose-optimization program which includes automated exposure control, adjustment of the mA and/or kV according to patient size and/or use of iterative reconstruction technique. COMPARISON:  None Available. FINDINGS: Cardiovascular: Atherosclerotic calcifications aorta, proximal great vessels, and coronary arteries. Aorta normal caliber. Pacemaker leads RIGHT atrium, RIGHT ventricle, and coronary sinus. Trace pericardial fluid. Mediastinum/Nodes: Esophagus unremarkable. Base of cervical region normal appearance. No thoracic adenopathy. Lungs/Pleura: Peripheral infiltrates in BILATERAL lower lobes. Scattered respiratory motion artifacts. Biapical scarring. Additional subpleural infiltrates in the anterior LEFT upper lobe and RIGHT middle lobe. Additional patchy infiltrates LEFT upper lobe. No pleural effusion or pneumothorax. 5 mm RIGHT upper lobe nodule image 43, noncalcified; see impression Upper Abdomen: Post cholecystectomy. Visualized upper abdomen otherwise unremarkable. Musculoskeletal: Osseous demineralization. IMPRESSION: Peripheral infiltrates in BILATERAL lower lobes, LEFT upper lobe, and RIGHT middle lobe, consistent with pneumonia. 5 mm noncalcified RIGHT upper lobe nodule; per Fleischner Society Guidelines, if patient is low risk for malignancy, no routine follow-up imaging is recommended. If patient is high risk for malignancy, a non-contrast Chest CT at 12 months is optional. If performed and the nodule is stable at 12 months, no further follow-up is recommended. These guidelines do not apply to immunocompromised patients and patients with cancer. Follow up in patients with significant comorbidities as clinically warranted. For lung cancer screening, adhere to Lung-RADS guidelines. Reference: Radiology. 2017; 284(1):228-43. Scattered  atherosclerotic disease changes including coronary arteries. Trace pericardial fluid. If patient is high risk for malignancy, a non-contrast Chest CT at 12 months is optional. Aortic Atherosclerosis (ICD10-I70.0). Electronically Signed   By: Lavonia Dana M.D.   On: 10/16/2022 09:14   CT Head Wo Contrast  Result Date: 10/16/2022 CLINICAL DATA:  Syncope/presyncope, cerebrovascular cause suspected. Dizziness. EXAM: CT HEAD WITHOUT CONTRAST TECHNIQUE: Contiguous axial images were obtained from the base of the skull through the vertex without intravenous contrast. RADIATION DOSE REDUCTION: This exam was performed according to the departmental dose-optimization program which includes automated exposure control, adjustment of the mA and/or kV according to patient size and/or use of iterative reconstruction technique. COMPARISON:  None Available. FINDINGS: Brain: No acute intracranial hemorrhage. 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 Gray-white differentiation is otherwise preserved. No hydrocephalus or extra-axial collection. No mass effect or midline shift. Vascular: No hyperdense vessel. Skull: No calvarial fracture or suspicious bone lesion. Skull base is unremarkable. Sinuses/Orbits: Mild mucosal disease throughout the paranasal sinuses. Mastoids are well aerated. Orbits are unremarkable. Other: None. IMPRESSION: 1. No acute intracranial abnormality. 2. 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 Electronically  Signed   By: Emmit Alexanders M.D.   On: 10/16/2022 08:35   DG Chest Port 1 View  Result Date: 10/15/2022 CLINICAL DATA:  COVID positive with cough for 2 days. EXAM: PORTABLE CHEST 1 VIEW COMPARISON:  Radiographs 11/27/2021 FINDINGS: Stable cardiomediastinal silhouette. Left chest wall CRT-D. Aortic atherosclerotic calcification. Interstitial coarsening has increased since 11/27/2021 and may be due to edema or atypical infection. No focal consolidation, pleural  effusion, or pneumothorax. Acute osseous abnormality. IMPRESSION: Interstitial coarsening has increased since 11/27/2021 and may be due to atypical infection or edema. Electronically Signed   By: Placido Sou M.D.   On: 10/15/2022 01:59   CUP PACEART REMOTE DEVICE CHECK  Result Date: 10/11/2022 Scheduled remote reviewed. Normal device function.  Next remote 91 days. [place]    TODAY-DAY OF DISCHARGE:  Subjective:   [name] today has no headache,no chest abdominal pain,no new weakness tingling or numbness, feels much better wants to go home today.   Objective:   Blood pressure (!) 144/71, pulse (!) 58, temperature (!) 97.5 F (36.4 C), temperature source Oral, resp. rate 19, height 6' (1.829 m), weight 73.5 kg, SpO2 94 %.  Intake/Output Summary (Last 24 hours) at 10/19/2022 1058 Last data filed at 10/19/2022 0622 Gross per 24 hour  Intake 453 ml  Output 750 ml  Net -297 ml   Filed Weights   10/16/22 0749  Weight: 73.5 kg    Exam: Awake Alert, Oriented *3, No new F.N deficits, Normal affect [place].AT,PERRAL Supple Neck,No JVD, No cervical lymphadenopathy appriciated.  Symmetrical Chest wall movement, Good air movement bilaterally, CTAB RRR,No Gallops,Rubs or new Murmurs, No Parasternal Heave +ve B.Sounds, Abd Soft, Non tender, No organomegaly appriciated, No rebound -guarding or rigidity. No Cyanosis, Clubbing or edema, No new Rash or bruise   PERTINENT RADIOLOGIC STUDIES: VAS [place] LOWER EXTREMITY VENOUS (DVT)  Result Date: 10/19/2022  Lower Venous DVT Study Patient Name:  ARYAN MAIDA  Date of Exam:   10/19/2022 Medical Rec #: [id number]          Accession #:    [id number] Date of Birth: 08-13-1931         Patient Gender: M Patient Age:   21 years Exam Location:  [facility] Procedure:      VAS [place] LOWER EXTREMITY VENOUS (DVT) Referring Phys: Oren Binet --------------------------------------------------------------------------------  Indications: Swelling, SOB,  and Weakness, COVID-19.  Comparison Study: No prior study. Performing Technologist: McKayla Maag RVT, [place]  Examination Guidelines: A complete evaluation includes B-mode imaging, spectral Doppler, color Doppler, and power Doppler as needed of all accessible portions of each vessel. Bilateral testing is considered an integral part of a complete examination. Limited examinations for reoccurring indications may be performed as noted. The reflux portion of the exam is performed with the patient in reverse Trendelenburg.  +---------+---------------+---------+-----------+----------+--------------+ RIGHT    CompressibilityPhasicitySpontaneityPropertiesThrombus Aging +---------+---------------+---------+-----------+----------+--------------+ CFV      Full           Yes      Yes                                 +---------+---------------+---------+-----------+----------+--------------+ SFJ      Full                                                        +---------+---------------+---------+-----------+----------+--------------+  FV Prox  Full                                                        +---------+---------------+---------+-----------+----------+--------------+ FV Mid   Full                                                        +---------+---------------+---------+-----------+----------+--------------+ FV DistalFull                                                        +---------+---------------+---------+-----------+----------+--------------+ PFV      Full                                                        +---------+---------------+---------+-----------+----------+--------------+ POP      Full           Yes      Yes                                 +---------+---------------+---------+-----------+----------+--------------+ PTV      Full                                                         +---------+---------------+---------+-----------+----------+--------------+ PERO     Full                                                        +---------+---------------+---------+-----------+----------+--------------+   +---------+---------------+---------+-----------+----------+--------------+ LEFT     CompressibilityPhasicitySpontaneityPropertiesThrombus Aging +---------+---------------+---------+-----------+----------+--------------+ CFV      Full           Yes      Yes                                 +---------+---------------+---------+-----------+----------+--------------+ SFJ      Full                                                        +---------+---------------+---------+-----------+----------+--------------+ FV Prox  Full                                                        +---------+---------------+---------+-----------+----------+--------------+  FV Mid   Full                                                        +---------+---------------+---------+-----------+----------+--------------+ FV DistalFull                                                        +---------+---------------+---------+-----------+----------+--------------+ PFV      Full                                                        +---------+---------------+---------+-----------+----------+--------------+ POP      Full           Yes      Yes                                 +---------+---------------+---------+-----------+----------+--------------+ PTV      Full                                                        +---------+---------------+---------+-----------+----------+--------------+ PERO     Full                                                        +---------+---------------+---------+-----------+----------+--------------+    Summary: BILATERAL: - No evidence of deep vein thrombosis seen in the lower extremities, bilaterally. - No evidence of  superficial venous thrombosis in the lower extremities, bilaterally. -No evidence of popliteal cyst, bilaterally.   *See table(s) above for measurements and observations.    Preliminary      PERTINENT LAB RESULTS: CBC: Recent Labs    10/17/22 0307 10/18/22 0220  WBC 6.5 6.1  HGB 8.7* 7.6*  HCT 24.7* 22.4*  PLT 177 183   CMET CMP     Component Value Date/Time   NA 138 10/19/2022 0735   NA 145 (H) 08/14/2018 1158   K 4.2 10/19/2022 0735   CL 110 10/19/2022 0735   CO2 17 (L) 10/19/2022 0735   GLUCOSE 134 (H) 10/19/2022 0735   BUN 51 (H) 10/19/2022 0735   BUN 24 08/14/2018 1158   CREATININE 1.69 (H) 10/19/2022 0735   CALCIUM 9.1 10/19/2022 0735   PROT 6.5 10/16/2022 0845   PROT 6.6 10/13/2021 0851   ALBUMIN 3.5 10/16/2022 0845   ALBUMIN 4.7 (H) 10/13/2021 0851   AST 24 10/16/2022 0845   ALT 10 10/16/2022 0845   ALKPHOS 48 10/16/2022 0845   BILITOT 1.0 10/16/2022 0845   BILITOT 1.0 10/13/2021 0851   GFRNONAA 39 (L) 10/19/2022 0735   GFRAA 46 (L) 08/21/2018 1003    GFR Estimated Creatinine Clearance:  31.4 mL/min (A) (by C-G formula based on SCr of 1.69 mg/dL (H)). No results for input(s): "LIPASE", "AMYLASE" in the last 72 hours. No results for input(s): "CKTOTAL", "CKMB", "CKMBINDEX", "TROPONINI" in the last 72 hours. Invalid input(s): "POCBNP" Recent Labs    10/18/22 0220  DDIMER 3.34*   No results for input(s): "HGBA1C" in the last 72 hours. No results for input(s): "CHOL", "HDL", "LDLCALC", "TRIG", "CHOLHDL", "LDLDIRECT" in the last 72 hours. Recent Labs    10/16/22 1155  TSH 1.023   No results for input(s): "VITAMINB12", "FOLATE", "FERRITIN", "TIBC", "IRON", "RETICCTPCT" in the last 72 hours. Coags: No results for input(s): "INR" in the last 72 hours.  Invalid input(s): "PT" Microbiology: No results found for this or any previous visit (from the past 240 hour(s)).  FURTHER DISCHARGE INSTRUCTIONS:  Get Medicines reviewed and adjusted: Please take all  your medications with you for your next visit with your Primary MD  Laboratory/radiological data: Please request your Primary MD to go over all hospital tests and procedure/radiological results at the follow up, please ask your Primary MD to get all Hospital records sent to his/her office.  In some cases, they will be blood work, cultures and biopsy results pending at the time of your discharge. Please request that your primary care M.D. goes through all the records of your hospital data and follows up on these results.  Also Note the following: If you experience worsening of your admission symptoms, develop shortness of breath, life threatening emergency, suicidal or homicidal thoughts you must seek medical attention immediately by calling 911 or calling your MD immediately  if symptoms less severe.  You must read complete instructions/literature along with all the possible adverse reactions/side effects for all the Medicines you take and that have been prescribed to you. Take any new Medicines after you have completely understood and accpet all the possible adverse reactions/side effects.   Do not drive when taking Pain medications or sleeping medications (Benzodaizepines)  Do not take more than prescribed Pain, Sleep and Anxiety Medications. It is not advisable to combine anxiety,sleep and pain medications without talking with your primary care practitioner  Special Instructions: If you have smoked or chewed Tobacco  in the last 2 yrs please stop smoking, stop any regular Alcohol  and or any Recreational drug use.  Wear Seat belts while driving.  Please note: You were cared for by a hospitalist during your hospital stay. Once you are discharged, your primary care physician will handle any further medical issues. Please note that NO REFILLS for any discharge medications will be authorized once you are discharged, as it is imperative that you return to your primary care physician (or establish  a relationship with a primary care physician if you do not have one) for your post hospital discharge needs so that they can reassess your need for medications and monitor your lab values.  Total Time spent coordinating discharge including counseling, education and face to face time equals greater than 30 minutes.  SignedOren Binet 10/19/2022 10:58 AM

## 2022-10-19 NOTE — Progress Notes (Signed)
Bilateral lower extremity venous study completed.   Preliminary results relayed to [facility], RN.  Please see CV Procedures for preliminary results.  Ariabella Brien, RVT  10:11 AM 10/19/22

## 2022-10-21 DIAGNOSIS — I700 Atherosclerosis of aorta: Secondary | ICD-10-CM | POA: Diagnosis not present

## 2022-10-21 DIAGNOSIS — Z6823 Body mass index (BMI) 23.0-23.9, adult: Secondary | ICD-10-CM | POA: Diagnosis not present

## 2022-10-21 DIAGNOSIS — I130 Hypertensive heart and chronic kidney disease with heart failure and stage 1 through stage 4 chronic kidney disease, or unspecified chronic kidney disease: Secondary | ICD-10-CM | POA: Diagnosis not present

## 2022-10-21 DIAGNOSIS — D631 Anemia in chronic kidney disease: Secondary | ICD-10-CM | POA: Diagnosis not present

## 2022-10-21 DIAGNOSIS — R911 Solitary pulmonary nodule: Secondary | ICD-10-CM | POA: Diagnosis not present

## 2022-10-21 DIAGNOSIS — J1282 Pneumonia due to coronavirus disease 2019: Secondary | ICD-10-CM | POA: Diagnosis not present

## 2022-10-21 DIAGNOSIS — N179 Acute kidney failure, unspecified: Secondary | ICD-10-CM | POA: Diagnosis not present

## 2022-10-21 DIAGNOSIS — I255 Ischemic cardiomyopathy: Secondary | ICD-10-CM | POA: Diagnosis not present

## 2022-10-21 DIAGNOSIS — M1990 Unspecified osteoarthritis, unspecified site: Secondary | ICD-10-CM | POA: Diagnosis not present

## 2022-10-21 DIAGNOSIS — J9601 Acute respiratory failure with hypoxia: Secondary | ICD-10-CM | POA: Diagnosis not present

## 2022-10-21 DIAGNOSIS — K219 Gastro-esophageal reflux disease without esophagitis: Secondary | ICD-10-CM | POA: Diagnosis not present

## 2022-10-21 DIAGNOSIS — N1832 Chronic kidney disease, stage 3b: Secondary | ICD-10-CM | POA: Diagnosis not present

## 2022-10-21 DIAGNOSIS — I252 Old myocardial infarction: Secondary | ICD-10-CM | POA: Diagnosis not present

## 2022-10-21 DIAGNOSIS — I5020 Unspecified systolic (congestive) heart failure: Secondary | ICD-10-CM | POA: Diagnosis not present

## 2022-10-21 DIAGNOSIS — Z9981 Dependence on supplemental oxygen: Secondary | ICD-10-CM | POA: Diagnosis not present

## 2022-10-21 DIAGNOSIS — I350 Nonrheumatic aortic (valve) stenosis: Secondary | ICD-10-CM | POA: Diagnosis not present

## 2022-10-21 DIAGNOSIS — I442 Atrioventricular block, complete: Secondary | ICD-10-CM | POA: Diagnosis not present

## 2022-10-21 DIAGNOSIS — E039 Hypothyroidism, unspecified: Secondary | ICD-10-CM | POA: Diagnosis not present

## 2022-10-21 DIAGNOSIS — I5032 Chronic diastolic (congestive) heart failure: Secondary | ICD-10-CM | POA: Diagnosis not present

## 2022-10-21 DIAGNOSIS — I2510 Atherosclerotic heart disease of native coronary artery without angina pectoris: Secondary | ICD-10-CM | POA: Diagnosis not present

## 2022-10-21 DIAGNOSIS — J45909 Unspecified asthma, uncomplicated: Secondary | ICD-10-CM | POA: Diagnosis not present

## 2022-10-21 DIAGNOSIS — E785 Hyperlipidemia, unspecified: Secondary | ICD-10-CM | POA: Diagnosis not present

## 2022-10-21 DIAGNOSIS — U071 COVID-19: Secondary | ICD-10-CM | POA: Diagnosis not present

## 2022-10-21 DIAGNOSIS — E669 Obesity, unspecified: Secondary | ICD-10-CM | POA: Diagnosis not present

## 2022-10-21 DIAGNOSIS — I9589 Other hypotension: Secondary | ICD-10-CM | POA: Diagnosis not present

## 2022-10-22 ENCOUNTER — Telehealth: Payer: Self-pay

## 2022-10-22 ENCOUNTER — Telehealth: Payer: Self-pay | Admitting: Internal Medicine

## 2022-10-22 DIAGNOSIS — I130 Hypertensive heart and chronic kidney disease with heart failure and stage 1 through stage 4 chronic kidney disease, or unspecified chronic kidney disease: Secondary | ICD-10-CM | POA: Diagnosis not present

## 2022-10-22 DIAGNOSIS — J1282 Pneumonia due to coronavirus disease 2019: Secondary | ICD-10-CM | POA: Diagnosis not present

## 2022-10-22 DIAGNOSIS — N179 Acute kidney failure, unspecified: Secondary | ICD-10-CM | POA: Diagnosis not present

## 2022-10-22 DIAGNOSIS — I9589 Other hypotension: Secondary | ICD-10-CM | POA: Diagnosis not present

## 2022-10-22 DIAGNOSIS — U071 COVID-19: Secondary | ICD-10-CM | POA: Diagnosis not present

## 2022-10-22 DIAGNOSIS — J9601 Acute respiratory failure with hypoxia: Secondary | ICD-10-CM | POA: Diagnosis not present

## 2022-10-22 NOTE — Telephone Encounter (Signed)
[place] for [employer].   Also please assist with pt f/u appt ROV asap as is due after recent hosp dc mar 15

## 2022-10-22 NOTE — Transitions of Care (Post Inpatient/ED Visit) (Signed)
   10/22/2022  Name: BELINDA OGANDO MRN: [id number] DOB: 12/20/1931  Today's TOC FU Call Status: Today's TOC FU Call Status:: Successful TOC FU Call Competed TOC FU Call Complete Date: 10/22/22  Transition Care Management Follow-up Telephone Call Date of Discharge: 10/19/22 Discharge Facility: [facility]) Type of Discharge: Inpatient Admission Primary Inpatient Discharge Diagnosis:: Acute hypoxic respiratory failure due to COVID-19 PNA How have you been since you were released from the hospital?: Better Any questions or concerns?: No  Items Reviewed: Did you receive and understand the discharge instructions provided?: Yes Medications obtained and verified?: Yes (Medications Reviewed) Any new allergies since your discharge?: No Dietary orders reviewed?: Yes Do you have support at home?: Yes  Home Care and Equipment/Supplies: [place] Ordered?: Yes Name of [place]:: [facility] Has Agency set up a time to come to your home?: Yes [place] Visit Date: 10/22/22 Any new equipment or medical supplies ordered?: No  Functional Questionnaire: Do you need assistance with bathing/showering or dressing?: No Do you need assistance with meal preparation?: No Do you need assistance with eating?: No Do you have difficulty maintaining continence: No Do you need assistance with getting out of bed/getting out of a chair/moving?: No Do you have difficulty managing or taking your medications?: No  Follow up appointments reviewed: PCP Follow-up appointment confirmed?: Yes Date of PCP follow-up appointment?: 10/26/22 Follow-up Provider: Dr Jenny Reichmann [facility] Follow-up appointment confirmed?: NA Do you need transportation to your follow-up appointment?: No Do you understand care options if your condition(s) worsen?: Yes-patient verbalized understanding    [place] LPN [place] Direct Dial  [phone]

## 2022-10-22 NOTE — Telephone Encounter (Signed)
Melissa, occupational therapist with [facility], called for verbals for occupational therapy 1 week 6. Best callback number is [phone] states message can be left as it is password protected.  Also stated patient oxygen level dropped to 77% when walking in the house and states its about 85-90% when resting, so his levels are below where they should be.

## 2022-10-23 NOTE — Telephone Encounter (Signed)
Notified Mellissa w/MD response../[email or web address] 

## 2022-10-23 NOTE — Telephone Encounter (Signed)
Patient's daughter is very concerned about his oxygen levels. She is requesting oxygen for patient. She said it could sometimes get down to the 50s resting. His current average is around 70s-80s.  Best call back for Neale Burly [phone].

## 2022-10-24 NOTE — Telephone Encounter (Signed)
Patient is scheduled for a hospital f/u on Friday, family has refused to proceed to ED due to exposure

## 2022-10-24 NOTE — Telephone Encounter (Signed)
I recommend pt go to ED for repeat evaluation, as it has been some time since his hospital d/c and he could be worsening medically for some reason.

## 2022-10-25 DIAGNOSIS — N179 Acute kidney failure, unspecified: Secondary | ICD-10-CM | POA: Diagnosis not present

## 2022-10-25 DIAGNOSIS — J9601 Acute respiratory failure with hypoxia: Secondary | ICD-10-CM | POA: Diagnosis not present

## 2022-10-25 DIAGNOSIS — I9589 Other hypotension: Secondary | ICD-10-CM | POA: Diagnosis not present

## 2022-10-25 DIAGNOSIS — U071 COVID-19: Secondary | ICD-10-CM | POA: Diagnosis not present

## 2022-10-25 DIAGNOSIS — J1282 Pneumonia due to coronavirus disease 2019: Secondary | ICD-10-CM | POA: Diagnosis not present

## 2022-10-25 DIAGNOSIS — I130 Hypertensive heart and chronic kidney disease with heart failure and stage 1 through stage 4 chronic kidney disease, or unspecified chronic kidney disease: Secondary | ICD-10-CM | POA: Diagnosis not present

## 2022-10-26 ENCOUNTER — Ambulatory Visit (INDEPENDENT_AMBULATORY_CARE_PROVIDER_SITE_OTHER): Payer: Medicare Other | Admitting: Internal Medicine

## 2022-10-26 ENCOUNTER — Encounter: Payer: Self-pay | Admitting: Internal Medicine

## 2022-10-26 ENCOUNTER — Telehealth: Payer: Self-pay | Admitting: Internal Medicine

## 2022-10-26 VITALS — BP 124/76 | HR 55 | Temp 98.3°F | Ht 72.0 in | Wt 163.0 lb

## 2022-10-26 DIAGNOSIS — B370 Candidal stomatitis: Secondary | ICD-10-CM

## 2022-10-26 DIAGNOSIS — D649 Anemia, unspecified: Secondary | ICD-10-CM

## 2022-10-26 DIAGNOSIS — I10 Essential (primary) hypertension: Secondary | ICD-10-CM

## 2022-10-26 DIAGNOSIS — R911 Solitary pulmonary nodule: Secondary | ICD-10-CM

## 2022-10-26 DIAGNOSIS — Z8616 Personal history of COVID-19: Secondary | ICD-10-CM | POA: Diagnosis not present

## 2022-10-26 DIAGNOSIS — Z8701 Personal history of pneumonia (recurrent): Secondary | ICD-10-CM

## 2022-10-26 DIAGNOSIS — U071 COVID-19: Secondary | ICD-10-CM

## 2022-10-26 LAB — BASIC METABOLIC PANEL
BUN: 23 mg/dL (ref 6–23)
CO2: 30 mEq/L (ref 19–32)
Calcium: 10.1 mg/dL (ref 8.4–10.5)
Chloride: 102 mEq/L (ref 96–112)
Creatinine, Ser: 1.66 mg/dL — ABNORMAL HIGH (ref 0.40–1.50)
GFR: 36.62 mL/min — ABNORMAL LOW (ref >60.00)
Glucose, Bld: 97 mg/dL (ref 70–99)
Potassium: 5.2 mEq/L — ABNORMAL HIGH (ref 3.5–5.1)
Sodium: 139 mEq/L (ref 135–145)

## 2022-10-26 LAB — HEPATIC FUNCTION PANEL
ALT: 13 U/L (ref 0–53)
AST: 20 U/L (ref 0–37)
Albumin: 3.9 g/dL (ref 3.5–5.2)
Alkaline Phosphatase: 51 U/L (ref 39–117)
Bilirubin, Direct: 0.1 mg/dL (ref 0.0–0.3)
Total Bilirubin: 0.8 mg/dL (ref 0.2–1.2)
Total Protein: 7.1 g/dL (ref 6.0–8.3)

## 2022-10-26 LAB — CBC WITH DIFFERENTIAL/PLATELET
Basophils Absolute: 0.1 10*3/uL (ref 0.0–0.1)
Basophils Relative: 0.9 % (ref 0.0–3.0)
Eosinophils Absolute: 0.4 10*3/uL (ref 0.0–0.7)
Eosinophils Relative: 6.3 % — ABNORMAL HIGH (ref 0.0–5.0)
HCT: 29.5 % — ABNORMAL LOW (ref 39.0–52.0)
Hemoglobin: 10.3 g/dL — ABNORMAL LOW (ref 13.0–17.0)
Lymphocytes Relative: 18.3 % (ref 12.0–46.0)
Lymphs Abs: 1.1 10*3/uL (ref 0.7–4.0)
MCHC: 35 g/dL (ref 30.0–36.0)
MCV: 93.1 fl (ref 78.0–100.0)
Monocytes Absolute: 0.5 10*3/uL (ref 0.1–1.0)
Monocytes Relative: 9.3 % (ref 3.0–12.0)
Neutro Abs: 3.8 10*3/uL (ref 1.4–7.7)
Neutrophils Relative %: 65.2 % (ref 43.0–77.0)
Platelets: 301 10*3/uL (ref 150.0–400.0)
RBC: 3.17 Mil/uL — ABNORMAL LOW (ref 4.22–5.81)
RDW: 13.6 % (ref 11.5–15.5)
WBC: 5.8 10*3/uL (ref 4.0–10.5)

## 2022-10-26 MED ORDER — FLUCONAZOLE 100 MG PO TABS: 100.0000 mg | ORAL_TABLET | Freq: Every day | ORAL | 0 refills | Status: DC

## 2022-10-26 NOTE — Telephone Encounter (Signed)
Will need telephone # for Katharine Look to relay msg from MD. No # was given.Marland KitchenJohny Chess

## 2022-10-26 NOTE — Progress Notes (Unsigned)
Patient ID: [name], male   DOB: 1931/09/01, 89 y.o.   MRN: [id number]        Chief Complaint: follow up post hospn mar 12 - Oct 19 2022       HPI:  [name] is a 89 y.o. male here with daughter post hospn  after developed fatigue/lethargy/confusion/poor oral intake/cough/shortness of breath/dizziness-and subsequently presented to the ED where he was found to have acute hypoxic respiratory failure, AKI and subsequently admitted to the hospitalist service.  Tx for covid pna with o2, IV steroid and remdesivier and empiric antibiotic with elevated procal.  Finished antibx in hosp, and d/c on taper steroid.  Tx with IVF, renal fxn improved.  Dizziness resolved.  D/c with HH   also incidentally 5 mm right upper lobe nodule noted - Radiology recommending noncontrast CT chest in 12 months.  Since d/c pt has developed severe tongue and mouth pain, much difficulty chewing and swallowing, daughter asking for aggressive tx as he is not taking po as well as she feels he needs.  Pt remains off ACE ramipril for now.  No recent overt bleeding, bruising.         Wt Readings from Last 3 Encounters:  10/26/22 163 lb (73.9 kg)  10/16/22 162 lb (73.5 kg)  10/15/22 163 lb (73.9 kg)   BP Readings from Last 3 Encounters:  10/26/22 124/76  10/19/22 (!) 144/71  10/15/22 107/68         Past Medical History:  Diagnosis Date   AICD (automatic cardioverter/defibrillator) present    "turned it off today, put in an extra lead,  and made it just a pacemaker" (08/21/2018)   Anemia, unspecified    Aortic stenosis    Mild, echo, October, 2011   Arthritis    "joints; arms"  (08/21/2018)   AV block, complete ([place])    Permanent pacemaker   CAD (coronary artery disease)    DES to LAD 2005 /  DES to LAD 2008   Cardiomyopathy    Ischemic, EF 30%, 2005   Chronic bronchitis (HCC)    DJD (degenerative joint disease)    Dyslipidemia    Ejection fraction < 50%    EF 30%, echo, 2005  /  EF 30%,  catheterization, 2008   GERD (gastroesophageal reflux disease)    Hyperlipidemia    Hypertension    Hypothyroidism    ICD (implantable cardiac defibrillator), single, in situ    for syncope and arrhythmia 2005 / generator change June, 2011   Iron deficiency anemia 06/17/2018   LBBB (left bundle branch block)    Mitral regurgitation    Mild, echo, October, 2011   Myocardial infarction [facility]) 1989   Presence of permanent cardiac pacemaker    Prostate cancer [facility])    Rectal fissure    Syncope    2005, ICD placed   Systolic CHF, chronic ([place])    Vitamin B12 deficiency    Past Surgical History:  Procedure Laterality Date   BIV UPGRADE  08/21/2018   BIV UPGRADE N/A 08/21/2018   Procedure: BIV UPGRADE;  Surgeon: Evans Lance, MD;  Location: [facility];  Service: Cardiovascular;  Laterality: N/A;   CARDIAC CATHETERIZATION  2008   ICD IMPLANT  2005   INSERTION PROSTATE RADIATION SEED     LAPAROSCOPIC CHOLECYSTECTOMY  04/2005   Dr. Lucia Gaskins   PROSTATE BIOPSY     RECTAL DILATATION     "stretched it twice" (08/21/2018)   SHOULDER OPEN ROTATOR  CUFF REPAIR Right     reports that he quit smoking about 70 years ago. His smoking use included cigarettes. He has a 2.00 pack-year smoking history. He has never used smokeless tobacco. He reports that he does not drink alcohol and does not use drugs. family history includes Breast cancer in his maternal aunt; Cancer (age of onset: 28) in his mother; Heart Problems in his brother and father; Heart attack in his brother; Heart attack (age of onset: 72) in his father; Lung cancer in his brother and sister; Other in his sister; Stroke in his brother and mother. Allergies  Allergen Reactions   Pantoprazole Other (See Comments)    B12 levels dropped on medication   Zetia [Ezetimibe] Other (See Comments)    Arm weakness   Current Outpatient Medications on File Prior to Visit  Medication Sig Dispense Refill   acetaminophen (TYLENOL) 650 MG CR  tablet Take 1,300 mg by mouth 2 (two) times daily.     amLODipine (NORVASC) 2.5 MG tablet Take 1 tablet (2.5 mg total) by mouth daily. 90 tablet 3   CALCIUM PO Take 2 tablets by mouth daily.      Cholecalciferol (VITAMIN D) 50 MCG (2000 UT) CAPS Take 2,000 Units by mouth daily.     clopidogrel (PLAVIX) 75 MG tablet Take 1 tablet (75 mg total) by mouth daily. 90 tablet 3   fish oil-omega-3 fatty acids 1000 MG capsule Take 1 g by mouth 2 (two) times daily.     gemfibrozil (LOPID) 600 MG tablet TAKE 1 TABLET BY MOUTH DAILY. GENERIC EQUIVALENT FOR LOPID (Patient taking differently: Take 600 mg by mouth daily. TAKE 1 TABLET BY MOUTH DAILY. GENERIC EQUIVALENT FOR LOPID) 90 tablet 3   levothyroxine (SYNTHROID) 50 MCG tablet Take 1 tablet (50 mcg total) by mouth daily before breakfast. 90 tablet 3   metoprolol succinate (TOPROL-XL) 50 MG 24 hr tablet Take 1 tablet (50 mg total) by mouth daily. Take with or immediately following a meal. 90 tablet 3   Multiple Vitamins-Minerals (MENS MULTIVITAMIN PLUS PO) Take 1 tablet by mouth daily.     nystatin cream (MYCOSTATIN) Apply 1 Application topically daily as needed for dry skin.     omeprazole (PRILOSEC) 20 MG capsule Take 1 capsule (20 mg total) by mouth daily. 90 capsule 3   polyethylene glycol (MIRALAX / GLYCOLAX) packet Take 17 g by mouth daily as needed for mild constipation. Take 17g mixed with liquid once daily, may do a second dose as needed for constipation     simvastatin (ZOCOR) 40 MG tablet Take 1 tablet (40 mg total) by mouth at bedtime. 90 tablet 3   predniSONE (DELTASONE) 10 MG tablet Take 4 tablets (40 mg) daily for 1 day, then 3 tablets (30 mg) daily for 1 day, 2 tablets (20 mg) daily for 1 days, 1 tablet (10 mg) daily for 1 day, then stop (Patient not taking: Reported on 10/26/2022) 10 tablet 0   No current facility-administered medications on file prior to visit.        ROS:  All others reviewed and negative.  Objective        PE:  BP  124/76 (BP Location: Right Arm, Patient Position: Sitting, Cuff Size: Normal)   Pulse (!) 55   Temp 98.3 F (36.8 C) (Oral)   Ht 6' (1.829 m)   Wt 163 lb (73.9 kg)   SpO2 95%   BMI 22.11 kg/m  Constitutional: Pt appears in NAD               HENT: Head: NCAT.                Right Ear: External ear normal.                 Left Ear: External ear normal.                Eyes: . Pupils are equal, round, and reactive to light. Conjunctivae and EOM are normal;  mouth and tongue with reddish thrush, no ulcers               Nose: without d/c or deformity               Neck: Neck supple. Gross normal ROM               Cardiovascular: Normal rate and regular rhythm.                 Pulmonary/Chest: Effort normal and breath sounds without rales or wheezing.                Abd:  Soft, NT, ND, + BS, no organomegaly               Neurological: Pt is alert. At baseline orientation, motor grossly intact               Skin: Skin is warm. No rashes, no other new lesions, LE edema - none               Psychiatric: Pt behavior is normal without agitation   Micro: none  Cardiac tracings I have personally interpreted today:  none  Pertinent Radiological findings (summarize): none   Lab Results  Component Value Date   WBC 5.8 10/26/2022   HGB 10.3 (L) 10/26/2022   HCT 29.5 (L) 10/26/2022   PLT 301.0 10/26/2022   GLUCOSE 97 10/26/2022   CHOL 188 08/08/2022   TRIG 206.0 (H) 08/08/2022   HDL 45.40 08/08/2022   LDLDIRECT 95.0 08/08/2022   LDLCALC 96 01/30/2022   ALT 13 10/26/2022   AST 20 10/26/2022   NA 139 10/26/2022   K 5.2 No hemolysis seen (H) 10/26/2022   CL 102 10/26/2022   CREATININE 1.66 (H) 10/26/2022   BUN 23 10/26/2022   CO2 30 10/26/2022   TSH 1.023 10/16/2022   PSA 0.00 (L) 01/24/2021   INR 0.95 01/06/2010   HGBA1C 5.7 08/08/2022   Assessment/Plan:  CARLEE GALLENSTEIN is a 89 y.o. White or Caucasian [1] male with  has a past medical history of AICD (automatic  cardioverter/defibrillator) present, Anemia, unspecified, Aortic stenosis, Arthritis, AV block, complete ([place]), CAD (coronary artery disease), Cardiomyopathy, Chronic bronchitis ([place]), DJD (degenerative joint disease), Dyslipidemia, Ejection fraction < 50%, GERD (gastroesophageal reflux disease), Hyperlipidemia, Hypertension, Hypothyroidism, ICD (implantable cardiac defibrillator), single, in situ, Iron deficiency anemia (06/17/2018), LBBB (left bundle branch block), Mitral regurgitation, Myocardial infarction ([place]) (1989), Presence of permanent cardiac pacemaker, Prostate cancer ([place]), Rectal fissure, Syncope, Systolic CHF, chronic ([place]), and Vitamin B12 deficiency.  Anemia No recent bleeding on anticoagulation chronic, for cbc with labs  Hypertension BP Readings from Last 3 Encounters:  10/26/22 124/76  10/19/22 (!) 144/71  10/15/22 107/68   Stable, pt to continue off Ramipril for now, may need to restart I suspect in 2-4 wks, to follow BP at home   Thrush Mod to severe and po intake threatening - for  diflucan 100 qd x 7 days  Pneumonia due to COVID-19 virus Clinically resolved,  to f/u any worsening symptoms or concerns  Pulmonary nodule To consider f/u CT chest at 1 yr  Followup: Return in about 4 months (around 02/25/2023).  Cathlean Cower, MD 10/28/2022 7:14 PM [facility]

## 2022-10-26 NOTE — Telephone Encounter (Signed)
Katharine Look with [facility] called to see was okay for a nurse to see pt weekly because his oxygen level had drop and she also want do some teaching with the family. Katharine Look also stated pt had COVID.

## 2022-10-26 NOTE — Patient Instructions (Addendum)
Please take all new medication as prescribed - the diflucan  Please continue to monitor your BP at home, and call in 3 weeks (or mychart message) if the BP is fairly consistently more then 140/90 to consider restarting the ramipril  Please continue all other medications as before, and refills have been done if requested.  Please have the pharmacy call with any other refills you may need.  Please keep your appointments with your specialists as you may have planned  Please go to the LAB at the blood drawing area for the tests to be done  You will be contacted by phone if any changes need to be made immediately.  Otherwise, you will receive a letter about your results with an explanation, but please check with MyChart first.  Please remember to sign up for MyChart if you have not done so, as this will be important to you in the future with finding out test results, communicating by private email, and scheduling acute appointments online when needed.  Please make an Appointment to return in 4 months, or sooner if needed

## 2022-10-26 NOTE — Telephone Encounter (Signed)
Ok for verbals 

## 2022-10-28 ENCOUNTER — Encounter: Payer: Self-pay | Admitting: Internal Medicine

## 2022-10-28 DIAGNOSIS — B370 Candidal stomatitis: Secondary | ICD-10-CM | POA: Insufficient documentation

## 2022-10-28 DIAGNOSIS — D649 Anemia, unspecified: Secondary | ICD-10-CM | POA: Insufficient documentation

## 2022-10-28 NOTE — Assessment & Plan Note (Signed)
BP Readings from Last 3 Encounters:  10/26/22 124/76  10/19/22 (!) 144/71  10/15/22 107/68   Stable, pt to continue off Ramipril for now, may need to restart I suspect in 2-4 wks, to follow BP at home

## 2022-10-28 NOTE — Assessment & Plan Note (Signed)
Mod to severe and po intake threatening - for diflucan 100 qd x 7 days

## 2022-10-28 NOTE — Assessment & Plan Note (Signed)
Clinically resolved,  to f/u any worsening symptoms or concerns 

## 2022-10-28 NOTE — Assessment & Plan Note (Signed)
To consider f/u CT chest at 1 yr

## 2022-10-28 NOTE — Assessment & Plan Note (Signed)
No recent bleeding on anticoagulation chronic, for cbc with labs

## 2022-10-29 DIAGNOSIS — I130 Hypertensive heart and chronic kidney disease with heart failure and stage 1 through stage 4 chronic kidney disease, or unspecified chronic kidney disease: Secondary | ICD-10-CM | POA: Diagnosis not present

## 2022-10-29 DIAGNOSIS — N179 Acute kidney failure, unspecified: Secondary | ICD-10-CM | POA: Diagnosis not present

## 2022-10-29 DIAGNOSIS — I9589 Other hypotension: Secondary | ICD-10-CM | POA: Diagnosis not present

## 2022-10-29 DIAGNOSIS — J1282 Pneumonia due to coronavirus disease 2019: Secondary | ICD-10-CM | POA: Diagnosis not present

## 2022-10-29 DIAGNOSIS — J9601 Acute respiratory failure with hypoxia: Secondary | ICD-10-CM | POA: Diagnosis not present

## 2022-10-29 DIAGNOSIS — U071 COVID-19: Secondary | ICD-10-CM | POA: Diagnosis not present

## 2022-11-01 ENCOUNTER — Telehealth: Payer: Self-pay | Admitting: Internal Medicine

## 2022-11-01 DIAGNOSIS — J9601 Acute respiratory failure with hypoxia: Secondary | ICD-10-CM | POA: Diagnosis not present

## 2022-11-01 DIAGNOSIS — N179 Acute kidney failure, unspecified: Secondary | ICD-10-CM | POA: Diagnosis not present

## 2022-11-01 DIAGNOSIS — I9589 Other hypotension: Secondary | ICD-10-CM | POA: Diagnosis not present

## 2022-11-01 DIAGNOSIS — U071 COVID-19: Secondary | ICD-10-CM | POA: Diagnosis not present

## 2022-11-01 DIAGNOSIS — I130 Hypertensive heart and chronic kidney disease with heart failure and stage 1 through stage 4 chronic kidney disease, or unspecified chronic kidney disease: Secondary | ICD-10-CM | POA: Diagnosis not present

## 2022-11-01 DIAGNOSIS — J1282 Pneumonia due to coronavirus disease 2019: Secondary | ICD-10-CM | POA: Diagnosis not present

## 2022-11-01 NOTE — Telephone Encounter (Signed)
[facility] states that the pt's heart rate was at 48 today. His pace maker is set to 50, but they wanted [place] to be aware.   Please call [facility] for any additional info.  [phone]

## 2022-11-01 NOTE — Telephone Encounter (Signed)
FYI, please advise if appropriate

## 2022-11-01 NOTE — Telephone Encounter (Signed)
Ok to follow for now

## 2022-11-02 DIAGNOSIS — U071 COVID-19: Secondary | ICD-10-CM | POA: Diagnosis not present

## 2022-11-02 DIAGNOSIS — J9601 Acute respiratory failure with hypoxia: Secondary | ICD-10-CM | POA: Diagnosis not present

## 2022-11-02 DIAGNOSIS — J1282 Pneumonia due to coronavirus disease 2019: Secondary | ICD-10-CM | POA: Diagnosis not present

## 2022-11-02 DIAGNOSIS — I9589 Other hypotension: Secondary | ICD-10-CM | POA: Diagnosis not present

## 2022-11-02 DIAGNOSIS — I130 Hypertensive heart and chronic kidney disease with heart failure and stage 1 through stage 4 chronic kidney disease, or unspecified chronic kidney disease: Secondary | ICD-10-CM | POA: Diagnosis not present

## 2022-11-02 DIAGNOSIS — N179 Acute kidney failure, unspecified: Secondary | ICD-10-CM | POA: Diagnosis not present

## 2022-11-05 DIAGNOSIS — N179 Acute kidney failure, unspecified: Secondary | ICD-10-CM | POA: Diagnosis not present

## 2022-11-05 DIAGNOSIS — U071 COVID-19: Secondary | ICD-10-CM | POA: Diagnosis not present

## 2022-11-05 DIAGNOSIS — J1282 Pneumonia due to coronavirus disease 2019: Secondary | ICD-10-CM | POA: Diagnosis not present

## 2022-11-05 DIAGNOSIS — I9589 Other hypotension: Secondary | ICD-10-CM | POA: Diagnosis not present

## 2022-11-05 DIAGNOSIS — I130 Hypertensive heart and chronic kidney disease with heart failure and stage 1 through stage 4 chronic kidney disease, or unspecified chronic kidney disease: Secondary | ICD-10-CM | POA: Diagnosis not present

## 2022-11-05 DIAGNOSIS — J9601 Acute respiratory failure with hypoxia: Secondary | ICD-10-CM | POA: Diagnosis not present

## 2022-11-09 ENCOUNTER — Ambulatory Visit (INDEPENDENT_AMBULATORY_CARE_PROVIDER_SITE_OTHER): Payer: Medicare Other | Admitting: Radiology

## 2022-11-09 DIAGNOSIS — I9589 Other hypotension: Secondary | ICD-10-CM | POA: Diagnosis not present

## 2022-11-09 DIAGNOSIS — U071 COVID-19: Secondary | ICD-10-CM | POA: Diagnosis not present

## 2022-11-09 DIAGNOSIS — E538 Deficiency of other specified B group vitamins: Secondary | ICD-10-CM

## 2022-11-09 DIAGNOSIS — J9601 Acute respiratory failure with hypoxia: Secondary | ICD-10-CM | POA: Diagnosis not present

## 2022-11-09 DIAGNOSIS — I130 Hypertensive heart and chronic kidney disease with heart failure and stage 1 through stage 4 chronic kidney disease, or unspecified chronic kidney disease: Secondary | ICD-10-CM | POA: Diagnosis not present

## 2022-11-09 DIAGNOSIS — N179 Acute kidney failure, unspecified: Secondary | ICD-10-CM | POA: Diagnosis not present

## 2022-11-09 DIAGNOSIS — J1282 Pneumonia due to coronavirus disease 2019: Secondary | ICD-10-CM | POA: Diagnosis not present

## 2022-11-09 MED ORDER — CYANOCOBALAMIN 1000 MCG/ML IJ SOLN
1000.0000 ug | Freq: Once | INTRAMUSCULAR | Status: AC
  Administered 2022-11-09: 1000 ug via INTRAMUSCULAR

## 2022-11-09 NOTE — Progress Notes (Signed)
Pt here for monthly B12 injection and tolerated well

## 2022-11-12 DIAGNOSIS — U071 COVID-19: Secondary | ICD-10-CM | POA: Diagnosis not present

## 2022-11-12 DIAGNOSIS — I130 Hypertensive heart and chronic kidney disease with heart failure and stage 1 through stage 4 chronic kidney disease, or unspecified chronic kidney disease: Secondary | ICD-10-CM | POA: Diagnosis not present

## 2022-11-12 DIAGNOSIS — I9589 Other hypotension: Secondary | ICD-10-CM | POA: Diagnosis not present

## 2022-11-12 DIAGNOSIS — J9601 Acute respiratory failure with hypoxia: Secondary | ICD-10-CM | POA: Diagnosis not present

## 2022-11-12 DIAGNOSIS — J1282 Pneumonia due to coronavirus disease 2019: Secondary | ICD-10-CM | POA: Diagnosis not present

## 2022-11-12 DIAGNOSIS — N179 Acute kidney failure, unspecified: Secondary | ICD-10-CM | POA: Diagnosis not present

## 2022-11-13 DIAGNOSIS — I9589 Other hypotension: Secondary | ICD-10-CM | POA: Diagnosis not present

## 2022-11-13 DIAGNOSIS — U071 COVID-19: Secondary | ICD-10-CM | POA: Diagnosis not present

## 2022-11-13 DIAGNOSIS — N179 Acute kidney failure, unspecified: Secondary | ICD-10-CM | POA: Diagnosis not present

## 2022-11-13 DIAGNOSIS — I130 Hypertensive heart and chronic kidney disease with heart failure and stage 1 through stage 4 chronic kidney disease, or unspecified chronic kidney disease: Secondary | ICD-10-CM | POA: Diagnosis not present

## 2022-11-13 DIAGNOSIS — J9601 Acute respiratory failure with hypoxia: Secondary | ICD-10-CM | POA: Diagnosis not present

## 2022-11-13 DIAGNOSIS — J1282 Pneumonia due to coronavirus disease 2019: Secondary | ICD-10-CM | POA: Diagnosis not present

## 2022-11-13 NOTE — Progress Notes (Signed)
Remote pacemaker transmission.   

## 2022-11-16 DIAGNOSIS — J9601 Acute respiratory failure with hypoxia: Secondary | ICD-10-CM | POA: Diagnosis not present

## 2022-11-16 DIAGNOSIS — N179 Acute kidney failure, unspecified: Secondary | ICD-10-CM | POA: Diagnosis not present

## 2022-11-16 DIAGNOSIS — J1282 Pneumonia due to coronavirus disease 2019: Secondary | ICD-10-CM | POA: Diagnosis not present

## 2022-11-16 DIAGNOSIS — U071 COVID-19: Secondary | ICD-10-CM | POA: Diagnosis not present

## 2022-11-16 DIAGNOSIS — I9589 Other hypotension: Secondary | ICD-10-CM | POA: Diagnosis not present

## 2022-11-16 DIAGNOSIS — I130 Hypertensive heart and chronic kidney disease with heart failure and stage 1 through stage 4 chronic kidney disease, or unspecified chronic kidney disease: Secondary | ICD-10-CM | POA: Diagnosis not present

## 2022-11-19 DIAGNOSIS — N179 Acute kidney failure, unspecified: Secondary | ICD-10-CM | POA: Diagnosis not present

## 2022-11-19 DIAGNOSIS — U071 COVID-19: Secondary | ICD-10-CM | POA: Diagnosis not present

## 2022-11-19 DIAGNOSIS — J1282 Pneumonia due to coronavirus disease 2019: Secondary | ICD-10-CM | POA: Diagnosis not present

## 2022-11-19 DIAGNOSIS — J9601 Acute respiratory failure with hypoxia: Secondary | ICD-10-CM | POA: Diagnosis not present

## 2022-11-19 DIAGNOSIS — I9589 Other hypotension: Secondary | ICD-10-CM | POA: Diagnosis not present

## 2022-11-19 DIAGNOSIS — I130 Hypertensive heart and chronic kidney disease with heart failure and stage 1 through stage 4 chronic kidney disease, or unspecified chronic kidney disease: Secondary | ICD-10-CM | POA: Diagnosis not present

## 2022-12-03 ENCOUNTER — Ambulatory Visit (INDEPENDENT_AMBULATORY_CARE_PROVIDER_SITE_OTHER): Payer: Medicare Other | Admitting: Emergency Medicine

## 2022-12-03 ENCOUNTER — Ambulatory Visit: Payer: Medicare Other | Admitting: Emergency Medicine

## 2022-12-03 ENCOUNTER — Encounter: Payer: Self-pay | Admitting: Emergency Medicine

## 2022-12-03 VITALS — BP 132/62 | HR 60 | Temp 97.7°F | Ht 72.0 in | Wt 155.2 lb

## 2022-12-03 DIAGNOSIS — K5909 Other constipation: Secondary | ICD-10-CM

## 2022-12-03 DIAGNOSIS — K624 Stenosis of anus and rectum: Secondary | ICD-10-CM | POA: Diagnosis not present

## 2022-12-03 NOTE — Patient Instructions (Signed)

## 2022-12-03 NOTE — Progress Notes (Signed)
[name] 89 y.o.   Chief Complaint  Patient presents with   Constipation    Patient has chronic constipation for years, getting worse, patient has some issues with diarrhea and is afraid eat and is losing weight.     HISTORY OF PRESENT ILLNESS: Acute problem visit today.  Patient of Dr. Oliver Barre. Accompanied by daughter This is a 89 y.o. male complaining of chronic constipation.  Laxative induced diarrhea.  History of anal stenosis. Denies nausea, or vomiting.  Denies abdominal pain.  Afraid to eat.  Losing weight as a result. Wt Readings from Last 3 Encounters:  12/03/22 155 lb 4 oz (70.4 kg)  10/26/22 163 lb (73.9 kg)  10/16/22 162 lb (73.5 kg)   No other complaints or medical concerns today.  Constipation Pertinent negatives include no abdominal pain, fever, nausea or vomiting.     Prior to Admission medications   Medication Sig Start Date End Date Taking? Authorizing Provider  acetaminophen (TYLENOL) 650 MG CR tablet Take 1,300 mg by mouth 2 (two) times daily.   Yes [provider]  amLODipine (NORVASC) 2.5 MG tablet Take 1 tablet (2.5 mg total) by mouth daily. 08/09/22  Yes Corwin Levins, MD  CALCIUM PO Take 2 tablets by mouth daily.    Yes [provider]  Cholecalciferol (VITAMIN D) 50 MCG (2000 UT) CAPS Take 2,000 Units by mouth daily.   Yes [provider]  clopidogrel (PLAVIX) 75 MG tablet Take 1 tablet (75 mg total) by mouth daily. 02/19/22  Yes Corwin Levins, MD  fish oil-omega-3 fatty acids 1000 MG capsule Take 1 g by mouth 2 (two) times daily.   Yes [provider]  fluconazole (DIFLUCAN) 100 MG tablet Take 1 tablet (100 mg total) by mouth daily. 10/26/22  Yes Corwin Levins, MD  gemfibrozil (LOPID) 600 MG tablet TAKE 1 TABLET BY MOUTH DAILY. GENERIC EQUIVALENT FOR LOPID Patient taking differently: Take 600 mg by mouth daily. TAKE 1 TABLET BY MOUTH DAILY. GENERIC EQUIVALENT FOR LOPID 04/23/22  Yes Corwin Levins, MD   levothyroxine (SYNTHROID) 50 MCG tablet Take 1 tablet (50 mcg total) by mouth daily before breakfast. 02/19/22  Yes Corwin Levins, MD  metoprolol succinate (TOPROL-XL) 50 MG 24 hr tablet Take 1 tablet (50 mg total) by mouth daily. Take with or immediately following a meal. 02/19/22  Yes Corwin Levins, MD  Multiple Vitamins-Minerals (MENS MULTIVITAMIN PLUS PO) Take 1 tablet by mouth daily.   Yes [provider]  nystatin cream (MYCOSTATIN) Apply 1 Application topically daily as needed for dry skin. 12/12/20  Yes [provider]  omeprazole (PRILOSEC) 20 MG capsule Take 1 capsule (20 mg total) by mouth daily. 08/16/22  Yes Corwin Levins, MD  polyethylene glycol [facility] / Ethelene Hal) packet Take 17 g by mouth daily as needed for mild constipation. Take 17g mixed with liquid once daily, may do a second dose as needed for constipation   Yes [provider]  simvastatin (ZOCOR) 40 MG tablet Take 1 tablet (40 mg total) by mouth at bedtime. 02/19/22  Yes Corwin Levins, MD    Allergies  Allergen Reactions   Pantoprazole Other (See Comments)    B12 levels dropped on medication   Zetia [Ezetimibe] Other (See Comments)    Arm weakness    Patient Active Problem List   Diagnosis Date Noted   Anemia 10/28/2022   Thrush 10/28/2022   Transient hypotension 10/16/2022   Elevated troponin 10/16/2022  Dizziness 10/16/2022   Heart failure with preserved ejection fraction (HCC) 10/16/2022   Hypothyroidism 10/16/2022   Pulmonary nodule 10/16/2022   CKD (chronic kidney disease) stage 3, GFR 30-59 ml/min (HCC) 08/08/2022   Complete heart block (HCC) 01/25/2021   Biventricular cardiac pacemaker in situ 01/25/2021   Pain in joint of left shoulder 03/09/2019   Thyroid nodule 01/30/2019   Hyperglycemia 01/30/2019   Iron deficiency anemia 06/17/2018   Anal stenosis 02/11/2018   Excessive cerumen in ear canal, left 09/20/2017   Sensorineural hearing loss (SNHL), bilateral 09/20/2017    Malignant neoplasm of prostate (HCC) 12/05/2016   Constipation 09/09/2014   Elevated PSA 09/09/2014   Carotid artery disease without cerebral infarction (HCC) 01/21/2014   Automatic implantable cardioverter-defibrillator in situ 09/10/2011   Chronic systolic heart failure (HCC) 09/10/2011   Ejection fraction < 50%    Hypertension    CAD (coronary artery disease)    LBBB (left bundle branch block)    Cardiomyopathy (HCC)    Mitral regurgitation    Aortic stenosis    Single implantable cardioverter-defibrillator in situ    Dyslipidemia    GERD (gastroesophageal reflux disease)    Syncope    Normocytic anemia 08/03/2008   Vitamin B 12 deficiency 03/01/2008   Osteoarthritis 08/19/2007    Past Medical History:  Diagnosis Date   AICD (automatic cardioverter/defibrillator) present    "turned it off today, put in an extra lead,  and made it just a pacemaker" (08/21/2018)   Anemia, unspecified    Aortic stenosis    Mild, echo, October, 2011   Arthritis    "joints; arms"  (08/21/2018)   AV block, complete (HCC)    Permanent pacemaker   CAD (coronary artery disease)    DES to LAD 2005 /  DES to LAD 2008   Cardiomyopathy    Ischemic, EF 30%, 2005   Chronic bronchitis (HCC)    DJD (degenerative joint disease)    Dyslipidemia    Ejection fraction < 50%    EF 30%, echo, 2005  /  EF 30%, catheterization, 2008   GERD (gastroesophageal reflux disease)    Hyperlipidemia    Hypertension    Hypothyroidism    ICD (implantable cardiac defibrillator), single, in situ    for syncope and arrhythmia 2005 / generator change June, 2011   Iron deficiency anemia 06/17/2018   LBBB (left bundle branch block)    Mitral regurgitation    Mild, echo, October, 2011   Myocardial infarction [facility]) 1989   Presence of permanent cardiac pacemaker    Prostate cancer [facility])    Rectal fissure    Syncope    2005, ICD placed   Systolic CHF, chronic (HCC)    Vitamin B12 deficiency     Past Surgical History:   Procedure Laterality Date   BIV UPGRADE  08/21/2018   BIV UPGRADE N/A 08/21/2018   Procedure: BIV UPGRADE;  Surgeon: Marinus Maw, MD;  Location: [facility] INVASIVE [facility] LAB;  Service: Cardiovascular;  Laterality: N/A;   CARDIAC CATHETERIZATION  2008   ICD IMPLANT  2005   INSERTION PROSTATE RADIATION SEED     LAPAROSCOPIC CHOLECYSTECTOMY  04/2005   Dr. Ezzard Standing   PROSTATE BIOPSY     RECTAL DILATATION     "stretched it twice" (08/21/2018)   SHOULDER OPEN ROTATOR CUFF REPAIR Right     Social History   Socioeconomic History   Marital status: Widowed    Spouse name: Not on file   Number of children:  2   Years of education: Not on file   Highest education level: Not on file  Occupational History   Occupation: repairs looms at [employer]  Tobacco Use   Smoking status: Former    Packs/day: 1.00    Years: 2.00    Additional pack years: 0.00    Total pack years: 2.00    Types: Cigarettes    Quit date: 08/06/1952    Years since quitting: 70.3   Smokeless tobacco: Never  Vaping Use   Vaping Use: Never used  Substance and Sexual Activity   Alcohol use: Never   Drug use: Never   Sexual activity: Not Currently  Other Topics Concern   Not on file  Social History Narrative   Not on file   Social Determinants of Health   Financial Resource Strain: Not on file  Food Insecurity: No Food Insecurity (10/16/2022)   Hunger Vital Sign    Worried About Running Out of Food in the Last Year: Never true    Ran Out of Food in the Last Year: Never true  Transportation Needs: No Transportation Needs (10/16/2022)   PRAPARE - Administrator, Civil Service (Medical): No    Lack of Transportation (Non-Medical): No  Physical Activity: Not on file  Stress: Not on file  Social Connections: Not on file  Intimate Partner Violence: Not At Risk (10/16/2022)   Humiliation, Afraid, Rape, and Kick questionnaire    Fear of Current or Ex-Partner: No    Emotionally Abused: No    Physically Abused: No     Sexually Abused: No    Family History  Problem Relation Age of Onset   Stroke Mother    Cancer Mother 40       unknown   Heart attack Father 61   Heart Problems Father    Heart attack Brother    Lung cancer Brother    Stroke Brother    Heart Problems Brother        CABG   Lung cancer Sister    Other Sister        PACEMAKER   Breast cancer Maternal Aunt      Review of Systems  Constitutional: Negative.  Negative for chills and fever.  HENT:  Positive for hearing loss. Negative for congestion and sore throat.   Respiratory: Negative.  Negative for cough and shortness of breath.   Cardiovascular: Negative.  Negative for chest pain and palpitations.  Gastrointestinal:  Positive for constipation. Negative for abdominal pain, blood in stool, nausea and vomiting.  Genitourinary: Negative.  Negative for dysuria and hematuria.  Skin: Negative.  Negative for rash.  Neurological: Negative.  Negative for dizziness and headaches.  All other systems reviewed and are negative.   Vitals:   12/03/22 0808  BP: 132/62  Pulse: 60  Temp: 97.7 F (36.5 C)  SpO2: 96%    Physical Exam Vitals reviewed.  Constitutional:      Appearance: Normal appearance.  HENT:     Head: Normocephalic.  Eyes:     Extraocular Movements: Extraocular movements intact.  Cardiovascular:     Rate and Rhythm: Normal rate.  Pulmonary:     Effort: Pulmonary effort is normal.  Abdominal:     General: There is no distension.     Palpations: Abdomen is soft.     Tenderness: There is no abdominal tenderness. There is no guarding.  Skin:    General: Skin is warm and dry.  Neurological:  Mental Status: He is alert and oriented to person, place, and time.  Psychiatric:        Mood and Affect: Mood normal.        Behavior: Behavior normal.      ASSESSMENT & PLAN: A total of 32 minutes was spent with the patient and counseling/coordination of care regarding preparing for this visit, review of most  recent office visit notes, review of multiple chronic medical conditions under management, review of all medications, diagnosis and treatment of chronic constipation, need for GI evaluation, prognosis, documentation, and need for follow-up.  Problem List Items Addressed This Visit       Digestive   Chronic constipation    Advised to increase amount of fiber and water in his diet. May continue using MiraLAX or milk of magnesia as needed Needs GI evaluation Referral placed today.      Relevant Orders   Ambulatory referral to Gastroenterology   Anal stenosis - Primary    Currently active and affecting quality of life Chronic constipation.  Diarrhea with laxatives Afraid to eat Needs GI evaluation.  Referral placed today.      Relevant Orders   Ambulatory referral to Gastroenterology   Patient Instructions  Constipation, Adult Constipation is when a person has trouble pooping (having a bowel movement). When you have this condition, you may poop fewer than 3 times a week. Your poop (stool) may also be dry, hard, or bigger than normal. Follow these instructions at home: Eating and drinking  Eat foods that have a lot of fiber, such as: Fresh fruits and vegetables. Whole grains. Beans. Eat less of foods that are low in fiber and high in fat and sugar, such as: [place] fries. Hamburgers. Cookies. Candy. Soda. Drink enough fluid to keep your pee (urine) pale yellow. General instructions Exercise regularly or as told by your doctor. Try to do 150 minutes of exercise each week. Go to the restroom when you feel like you need to poop. Do not hold it in. Take over-the-counter and prescription medicines only as told by your doctor. These include any fiber supplements. When you poop: Do deep breathing while relaxing your lower belly (abdomen). Relax your pelvic floor. The pelvic floor is a group of muscles that support the rectum, bladder, and intestines (as well as the uterus in  women). Watch your condition for any changes. Tell your doctor if you notice any. Keep all follow-up visits as told by your doctor. This is important. Contact a doctor if: You have pain that gets worse. You have a fever. You have not pooped for 4 days. You vomit. You are not hungry. You lose weight. You are bleeding from the opening of the butt (anus). You have thin, pencil-like poop. Get help right away if: You have a fever, and your symptoms suddenly get worse. You leak poop or have blood in your poop. Your belly feels hard or bigger than normal (bloated). You have very bad belly pain. You feel dizzy or you faint. Summary Constipation is when a person poops fewer than 3 times a week, has trouble pooping, or has poop that is dry, hard, or bigger than normal. Eat foods that have a lot of fiber. Drink enough fluid to keep your pee (urine) pale yellow. Take over-the-counter and prescription medicines only as told by your doctor. These include any fiber supplements. This information is not intended to replace advice given to you by your health care provider. Make sure you discuss any questions you have  with your health care provider. Document Revised: 06/06/2022 Document Reviewed: 06/06/2022 Elsevier Patient Education  2023 Elsevier Inc.    Edwina Barth, MD [facility] at [facility]

## 2022-12-03 NOTE — Assessment & Plan Note (Signed)
Currently active and affecting quality of life Chronic constipation.  Diarrhea with laxatives Afraid to eat Needs GI evaluation.  Referral placed today.

## 2022-12-03 NOTE — Assessment & Plan Note (Signed)
Advised to increase amount of fiber and water in his diet. May continue using MiraLAX or milk of magnesia as needed Needs GI evaluation Referral placed today.

## 2022-12-11 DIAGNOSIS — Z8546 Personal history of malignant neoplasm of prostate: Secondary | ICD-10-CM | POA: Diagnosis not present

## 2022-12-12 ENCOUNTER — Ambulatory Visit (INDEPENDENT_AMBULATORY_CARE_PROVIDER_SITE_OTHER): Payer: Medicare Other

## 2022-12-12 DIAGNOSIS — E538 Deficiency of other specified B group vitamins: Secondary | ICD-10-CM

## 2022-12-12 MED ORDER — CYANOCOBALAMIN 1000 MCG/ML IJ SOLN
1000.0000 ug | Freq: Once | INTRAMUSCULAR | Status: AC
  Administered 2022-12-12: 1000 ug via INTRAMUSCULAR

## 2022-12-12 NOTE — Progress Notes (Signed)
Pt here for monthly B12 injection   B12 1000mcg given IM and pt tolerated injection well.   

## 2022-12-18 DIAGNOSIS — Z8546 Personal history of malignant neoplasm of prostate: Secondary | ICD-10-CM | POA: Diagnosis not present

## 2023-01-10 ENCOUNTER — Ambulatory Visit (INDEPENDENT_AMBULATORY_CARE_PROVIDER_SITE_OTHER): Payer: Medicare Other

## 2023-01-10 DIAGNOSIS — I255 Ischemic cardiomyopathy: Secondary | ICD-10-CM | POA: Diagnosis not present

## 2023-01-10 LAB — CUP PACEART REMOTE DEVICE CHECK
Battery Remaining Longevity: 32 mo
Battery Remaining Percentage: 40 %
Battery Voltage: 2.95 V
Brady Statistic AP VP Percent: 84 %
Brady Statistic AP VS Percent: 1 %
Brady Statistic AS VP Percent: 16 %
Brady Statistic AS VS Percent: 1 %
Brady Statistic RA Percent Paced: 83 %
Date Time Interrogation Session: 20240606022307
Implantable Lead Connection Status: 753985
Implantable Lead Connection Status: 753985
Implantable Lead Connection Status: 753985
Implantable Lead Implant Date: 20050509
Implantable Lead Implant Date: 20050509
Implantable Lead Implant Date: 20200116
Implantable Lead Location: 753858
Implantable Lead Location: 753859
Implantable Lead Location: 753860
Implantable Lead Model: 1580
Implantable Pulse Generator Implant Date: 20200116
Lead Channel Impedance Value: 260 Ohm
Lead Channel Impedance Value: 360 Ohm
Lead Channel Impedance Value: 780 Ohm
Lead Channel Pacing Threshold Amplitude: 0.75 V
Lead Channel Pacing Threshold Amplitude: 1 V
Lead Channel Pacing Threshold Amplitude: 1 V
Lead Channel Pacing Threshold Pulse Width: 0.5 ms
Lead Channel Pacing Threshold Pulse Width: 0.5 ms
Lead Channel Pacing Threshold Pulse Width: 1 ms
Lead Channel Sensing Intrinsic Amplitude: 1.1 mV
Lead Channel Sensing Intrinsic Amplitude: 5.1 mV
Lead Channel Setting Pacing Amplitude: 1.75 V
Lead Channel Setting Pacing Amplitude: 2 V
Lead Channel Setting Pacing Amplitude: 2.5 V
Lead Channel Setting Pacing Pulse Width: 0.5 ms
Lead Channel Setting Pacing Pulse Width: 1 ms
Lead Channel Setting Sensing Sensitivity: 4 mV
Pulse Gen Model: 3562
Pulse Gen Serial Number: 9097773

## 2023-01-16 ENCOUNTER — Ambulatory Visit (INDEPENDENT_AMBULATORY_CARE_PROVIDER_SITE_OTHER): Payer: Medicare Other | Admitting: *Deleted

## 2023-01-16 ENCOUNTER — Encounter: Payer: Self-pay | Admitting: Internal Medicine

## 2023-01-16 ENCOUNTER — Ambulatory Visit (INDEPENDENT_AMBULATORY_CARE_PROVIDER_SITE_OTHER): Payer: Medicare Other | Admitting: Internal Medicine

## 2023-01-16 VITALS — BP 120/76 | HR 67 | Ht 72.0 in | Wt 161.0 lb

## 2023-01-16 DIAGNOSIS — K624 Stenosis of anus and rectum: Secondary | ICD-10-CM | POA: Diagnosis not present

## 2023-01-16 DIAGNOSIS — K5900 Constipation, unspecified: Secondary | ICD-10-CM

## 2023-01-16 DIAGNOSIS — E538 Deficiency of other specified B group vitamins: Secondary | ICD-10-CM | POA: Diagnosis not present

## 2023-01-16 MED ORDER — CYANOCOBALAMIN 1000 MCG/ML IJ SOLN
1000.0000 ug | Freq: Once | INTRAMUSCULAR | Status: AC
  Administered 2023-01-16: 1000 ug via INTRAMUSCULAR

## 2023-01-16 NOTE — Progress Notes (Signed)
Pls cosign for B12 inj../lmb  

## 2023-01-16 NOTE — Patient Instructions (Signed)
Titrate Miralax as discussed with Dr. Marina Goodell  _______________________________________________________  If your blood pressure at your visit was 140/90 or greater, please contact your primary care physician to follow up on this.  _______________________________________________________  If you are age 87 or older, your body mass index should be between 23-30. Your Body mass index is 21.84 kg/m. If this is out of the aforementioned range listed, please consider follow up with your Primary Care Provider.  If you are age 42 or younger, your body mass index should be between 19-25. Your Body mass index is 21.84 kg/m. If this is out of the aformentioned range listed, please consider follow up with your Primary Care Provider.   ________________________________________________________  The [name] providers would like to encourage you to use [facility] to communicate with providers for non-urgent requests or questions.  Due to long hold times on the telephone, sending your provider a message by [facility] may be a faster and more efficient way to get a response.  Please allow 48 business hours for a response.  Please remember that this is for non-urgent requests.  _______________________________________________________

## 2023-01-16 NOTE — Progress Notes (Signed)
HISTORY OF PRESENT ILLNESS:  [name] is a 89 y.o. male with multiple significant medical problems as listed below.  He is self-referred and accompanied by his daughter regarding problems with constipation.  I last saw the patient October 2017 regarding spurious anemia result, heme positive stool, and significant constipation.  He was not deemed an appropriate candidate for colonoscopy due to his comorbidities.  He did have colonoscopy with Dr. Jarold Motto in 2005, which was normal.  Patient and his daughter state that he has continued with ongoing problems with constipation which she has managed with various agents.  However, in March she developed COVID and was hospitalized for a week.  Thereafter his issues with constipation quite severe.  He eventually went to [facility] but this was complicated at some point by diarrhea after escalating dosages.  He reports a history of rectal stricturing which makes defecation difficult with hard stools.  No bleeding, weight loss, or other complaints.  Blood work from October 26, 2022 shows unremarkable comprehensive metabolic panel except for creatinine 1.66.  Hemoglobin 10.3 with MCV 93.1.  REVIEW OF SYSTEMS:  All non-GI ROS negative unless otherwise stated in the HPI except for arthritis  Past Medical History:  Diagnosis Date   AICD (automatic cardioverter/defibrillator) present    "turned it off today, put in an extra lead,  and made it just a pacemaker" (08/21/2018)   Anemia, unspecified    Aortic stenosis    Mild, echo, October, 2011   Arthritis    "joints; arms"  (08/21/2018)   AV block, complete (HCC)    Permanent pacemaker   CAD (coronary artery disease)    DES to LAD 2005 /  DES to LAD 2008   Cardiomyopathy    Ischemic, EF 30%, 2005   Chronic bronchitis (HCC)    DJD (degenerative joint disease)    Dyslipidemia    Ejection fraction < 50%    EF 30%, echo, 2005  /  EF 30%, catheterization, 2008   GERD (gastroesophageal reflux disease)     Hyperlipidemia    Hypertension    Hypothyroidism    ICD (implantable cardiac defibrillator), single, in situ    for syncope and arrhythmia 2005 / generator change June, 2011   Iron deficiency anemia 06/17/2018   LBBB (left bundle branch block)    Mitral regurgitation    Mild, echo, October, 2011   Myocardial infarction [place]) 1989   Presence of permanent cardiac pacemaker    Prostate cancer [facility])    Rectal fissure    Syncope    2005, ICD placed   Systolic CHF, chronic (HCC)    Vitamin B12 deficiency     Past Surgical History:  Procedure Laterality Date   BIV UPGRADE  08/21/2018   BIV UPGRADE N/A 08/21/2018   Procedure: BIV UPGRADE;  Surgeon: Marinus Maw, MD;  [facility]: [facility];  Service: Cardiovascular;  Laterality: N/A;   CARDIAC CATHETERIZATION  2008   ICD IMPLANT  2005   INSERTION PROSTATE RADIATION SEED     LAPAROSCOPIC CHOLECYSTECTOMY  04/2005   Dr. Ezzard Standing   PROSTATE BIOPSY     RECTAL DILATATION     "stretched it twice" (08/21/2018)   SHOULDER OPEN ROTATOR CUFF REPAIR Right     Social History [name]  reports that he quit smoking about 70 years ago. His smoking use included cigarettes. He has a 2.00 pack-year smoking history. He has never used smokeless tobacco. He reports that he does not drink alcohol and  does not use drugs.  family history includes Breast cancer in his maternal aunt; Cancer (age of onset: 87) in his mother; Heart Problems in his brother and father; Heart attack in his brother; Heart attack (age of onset: 83) in his father; Lung cancer in his brother and sister; Other in his sister; Stroke in his brother and mother.  Allergies  Allergen Reactions   Pantoprazole Other (See Comments)    B12 levels dropped on medication   Zetia [Ezetimibe] Other (See Comments)    Arm weakness       PHYSICAL EXAMINATION: Vital signs: BP 120/76   Pulse 67   Ht 6' (1.829 m)   Wt 161 lb (73 kg)   BMI 21.84 kg/m   Constitutional: Elderly  and frail but generally well-appearing, no acute distress Psychiatric: alert and oriented x3, cooperative Eyes: extraocular movements intact, anicteric, conjunctiva pink Mouth: oral pharynx moist, no lesions Neck: supple no lymphadenopathy Cardiovascular: heart regular rate and rhythm Lungs: clear to auscultation bilaterally Abdomen: soft, nontender, nondistended, no obvious ascites, no peritoneal signs, normal bowel sounds, no organomegaly Rectal: No external abnormalities.  Denies stricturing of the anus which was digitally dilated.  Soft brown stool.  No mass Extremities: no clubbing, cyanosis, or lower extremity edema bilaterally Skin: no lesions on visible extremities Neuro: No focal deficits.  Cranial nerves intact  ASSESSMENT:  1.  Chronic constipation 2.  Benign anal stricture 3.  Multiple medical problems 4.  Normal colonoscopy 2005   PLAN:  1.  Stricture manually dilated 2.  MiraLAX.  Titrate to achieve desired results.  Reviewed with him in detail. 3.  Resume general medical care with PCP A total time of 45 minutes was spent preparing to see the patient, obtaining comprehensive history, performing medically appropriate physical exam, counseling educating patient and his daughter regarding the above listed issues, directing medical therapy for his constipation, and documenting clinical information in the health record.

## 2023-01-30 NOTE — Progress Notes (Signed)
Remote pacemaker transmission.   

## 2023-02-05 ENCOUNTER — Encounter: Payer: Self-pay | Admitting: Internal Medicine

## 2023-02-05 ENCOUNTER — Ambulatory Visit: Payer: Medicare Other | Attending: Internal Medicine | Admitting: Internal Medicine

## 2023-02-05 VITALS — BP 146/84 | HR 68 | Ht 71.5 in | Wt 168.0 lb

## 2023-02-05 DIAGNOSIS — I442 Atrioventricular block, complete: Secondary | ICD-10-CM | POA: Diagnosis not present

## 2023-02-05 NOTE — Patient Instructions (Signed)
Medication Instructions:  Your physician recommends that you continue on your current medications as directed. Please refer to the Current Medication list given to you today.  *If you need a refill on your cardiac medications before your next appointment, please call your pharmacy*  Follow-Up: At  [facility], you and your health needs are our priority.  As part of our continuing mission to provide you with exceptional heart care, we have created designated Provider Care Teams.  These Care Teams include your primary Cardiologist (physician) and Advanced Practice Providers (APPs -  Physician Assistants and Nurse Practitioners) who all work together to provide you with the care you need, when you need it.  Your next appointment:   1 year  Provider:   You may see Gregg Taylor, MD or one of the following Advanced Practice Providers on your designated Care Team:   Renee Ursuy, PA-C Michael "Andy" Tillery, PA-C Suzann Riddle, NP Brandi Ollis, NP    

## 2023-02-05 NOTE — Progress Notes (Signed)
HPI [name] returns today for followup. He is a pleasant 89 yo man with CHB, s/p PPM insertion with upgrade to a biv PPM. He has class 2 CHF. He has done well in the interim. His chest pain has resolved. He has class 2A CHF symptoms. 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 He has not passed out and has not had diaghragmatic stimulation.  Back in March he developed Covid and was in the hospital for several days.  Allergies  Allergen Reactions   Pantoprazole Other (See Comments)    B12 levels dropped on medication   Zetia [Ezetimibe] Other (See Comments)    Arm weakness     Current Outpatient Medications  Medication Sig Dispense Refill   acetaminophen (TYLENOL) 650 MG CR tablet Take 1,300 mg by mouth 2 (two) times daily.     amLODipine (NORVASC) 2.5 MG tablet Take 1 tablet (2.5 mg total) by mouth daily. 90 tablet 3   CALCIUM PO Take 2 tablets by mouth daily.      Cholecalciferol (VITAMIN D) 50 MCG (2000 UT) CAPS Take 2,000 Units by mouth daily.     clopidogrel (PLAVIX) 75 MG tablet Take 1 tablet (75 mg total) by mouth daily. 90 tablet 3   fish oil-omega-3 fatty acids 1000 MG capsule Take 1 g by mouth 2 (two) times daily.     fluconazole (DIFLUCAN) 100 MG tablet Take 1 tablet (100 mg total) by mouth daily. 7 tablet 0   gemfibrozil (LOPID) 600 MG tablet TAKE 1 TABLET BY MOUTH DAILY. GENERIC EQUIVALENT FOR LOPID (Patient taking differently: Take 600 mg by mouth daily. TAKE 1 TABLET BY MOUTH DAILY. GENERIC EQUIVALENT FOR LOPID) 90 tablet 3   levothyroxine (SYNTHROID) 50 MCG tablet Take 1 tablet (50 mcg total) by mouth daily before breakfast. 90 tablet 3   metoprolol succinate (TOPROL-XL) 50 MG 24 hr tablet Take 1 tablet (50 mg total) by mouth daily. Take with or immediately following a meal. 90 tablet 3   Multiple Vitamins-Minerals (MENS MULTIVITAMIN PLUS PO) Take 1 tablet by mouth daily.     nystatin cream (MYCOSTATIN) Apply 1 Application topically daily as needed for dry skin.     omeprazole  (PRILOSEC) 20 MG capsule Take 1 capsule (20 mg total) by mouth daily. 90 capsule 3   polyethylene glycol (MIRALAX / GLYCOLAX) packet Take 17 g by mouth daily as needed for mild constipation. Take 17g mixed with liquid once daily, may do a second dose as needed for constipation     simvastatin (ZOCOR) 40 MG tablet Take 1 tablet (40 mg total) by mouth at bedtime. 90 tablet 3   No current facility-administered medications for this visit.     Past Medical History:  Diagnosis Date   AICD (automatic cardioverter/defibrillator) present    "turned it off today, put in an extra lead,  and made it just a pacemaker" (08/21/2018)   Anemia, unspecified    Aortic stenosis    Mild, echo, October, 2011   Arthritis    "joints; arms"  (08/21/2018)   AV block, complete (HCC)    Permanent pacemaker   CAD (coronary artery disease)    DES to LAD 2005 /  DES to LAD 2008   Cardiomyopathy    Ischemic, EF 30%, 2005   Chronic bronchitis (HCC)    DJD (degenerative joint disease)    Dyslipidemia    Ejection fraction < 50%    EF 30%, echo, 2005  /  EF 30%, catheterization,  2008   GERD (gastroesophageal reflux disease)    Hyperlipidemia    Hypertension    Hypothyroidism    ICD (implantable cardiac defibrillator), single, in situ    for syncope and arrhythmia 2005 / generator change June, 2011   Iron deficiency anemia 06/17/2018   LBBB (left bundle branch block)    Mitral regurgitation    Mild, echo, October, 2011   Myocardial infarction [facility]) 1989   Presence of permanent cardiac pacemaker    Prostate cancer [facility])    Rectal fissure    Syncope    2005, ICD placed   Systolic CHF, chronic (HCC)    Vitamin B12 deficiency     ROS:   All systems reviewed and negative except as noted in the HPI.   Past Surgical History:  Procedure Laterality Date   BIV UPGRADE  08/21/2018   BIV UPGRADE N/A 08/21/2018   Procedure: BIV UPGRADE;  Surgeon: Marinus Maw, MD;  [facility];  Service:  Cardiovascular;  Laterality: N/A;   CARDIAC CATHETERIZATION  2008   ICD IMPLANT  2005   INSERTION PROSTATE RADIATION SEED     LAPAROSCOPIC CHOLECYSTECTOMY  04/2005   Dr. Ezzard Standing   PROSTATE BIOPSY     RECTAL DILATATION     "stretched it twice" (08/21/2018)   SHOULDER OPEN ROTATOR CUFF REPAIR Right      Family History  Problem Relation Age of Onset   Stroke Mother    Cancer Mother 57       unknown   Heart attack Father 59   Heart Problems Father    Lung cancer Sister    Other Sister        PACEMAKER   Heart attack Brother    Lung cancer Brother    Stroke Brother    Heart Problems Brother        CABG   Breast cancer Maternal Aunt    Colon cancer Neg Hx    Esophageal cancer Neg Hx    Stomach cancer Neg Hx      Social History   Socioeconomic History   Marital status: Widowed    Spouse name: Not on file   Number of children: 2   Years of education: Not on file   Highest education level: Not on file  Occupational History   Occupation: repairs looms at [employer]   Occupation: retired  Tobacco Use   Smoking status: Former    Packs/day: 1.00    Years: 2.00    Additional pack years: 0.00    Total pack years: 2.00    Types: Cigarettes    Quit date: 08/06/1952    Years since quitting: 70.5   Smokeless tobacco: Never  Vaping Use   Vaping Use: Never used  Substance and Sexual Activity   Alcohol use: Never   Drug use: Never   Sexual activity: Not Currently  Other Topics Concern   Not on file  Social History Narrative   Not on file   Social Determinants of Health   Financial Resource Strain: Not on file  Food Insecurity: No Food Insecurity (10/16/2022)   Hunger Vital Sign    Worried About Running Out of Food in the Last Year: Never true    Ran Out of Food in the Last Year: Never true  Transportation Needs: No Transportation Needs (10/16/2022)   PRAPARE - Administrator, Civil Service (Medical): No    Lack of Transportation (Non-Medical): No  Physical  Activity: Not  on file  Stress: Not on file  Social Connections: Not on file  Intimate Partner Violence: Not At Risk (10/16/2022)   Humiliation, Afraid, Rape, and Kick questionnaire    Fear of Current or Ex-Partner: No    Emotionally Abused: No    Physically Abused: No    Sexually Abused: No     BP (!) 146/84   Pulse 68   Ht 5' 11.5" (1.816 m)   Wt 168 lb (76.2 kg)   SpO2 99%   BMI 23.10 kg/m   Physical Exam:  Well appearing NAD HEENT: Unremarkable Neck:  No JVD, no thyromegally Lymphatics:  No adenopathy Back:  No CVA tenderness Lungs:  Clear HEART:  Regular rate rhythm, no murmurs, no rubs, no clicks Abd:  soft, positive bowel sounds, no organomegally, no rebound, no guarding Ext:  2 plus pulses, no edema, no cyanosis, no clubbing Skin:  No rashes no nodules Neuro:  CN II through XII intact, motor grossly intact  DEVICE  Normal device function.  See PaceArt for details.   Assess/Plan: CHB - he is asymptomatic s/p PPM insertion. Chronic systolic heart failure - his symptoms are class 2. He will continue his current medical therapy. PPM -his [employer] Biv PPM is working normally. No changes in programming were made. Diaghragmatic stimulation - this has resolved with programming.  Sharlot Gowda Analucia Hush,MD

## 2023-02-06 ENCOUNTER — Ambulatory Visit: Payer: Medicare Other | Admitting: Internal Medicine

## 2023-02-06 LAB — CUP PACEART INCLINIC DEVICE CHECK
Battery Remaining Longevity: 30 mo
Battery Voltage: 2.95 V
Brady Statistic RA Percent Paced: 84 %
Brady Statistic RV Percent Paced: 99.93 %
Date Time Interrogation Session: 20240702151700
Implantable Lead Connection Status: 753985
Implantable Lead Connection Status: 753985
Implantable Lead Connection Status: 753985
Implantable Lead Implant Date: 20050509
Implantable Lead Implant Date: 20050509
Implantable Lead Implant Date: 20200116
Implantable Lead Location: 753858
Implantable Lead Location: 753859
Implantable Lead Location: 753860
Implantable Lead Model: 1580
Implantable Pulse Generator Implant Date: 20200116
Lead Channel Impedance Value: 275 Ohm
Lead Channel Impedance Value: 362.5 Ohm
Lead Channel Impedance Value: 737.5 Ohm
Lead Channel Pacing Threshold Amplitude: 0.75 V
Lead Channel Pacing Threshold Amplitude: 0.75 V
Lead Channel Pacing Threshold Amplitude: 1 V
Lead Channel Pacing Threshold Amplitude: 1 V
Lead Channel Pacing Threshold Amplitude: 1 V
Lead Channel Pacing Threshold Amplitude: 1 V
Lead Channel Pacing Threshold Pulse Width: 0.5 ms
Lead Channel Pacing Threshold Pulse Width: 0.5 ms
Lead Channel Pacing Threshold Pulse Width: 0.5 ms
Lead Channel Pacing Threshold Pulse Width: 0.5 ms
Lead Channel Pacing Threshold Pulse Width: 1 ms
Lead Channel Pacing Threshold Pulse Width: 1 ms
Lead Channel Sensing Intrinsic Amplitude: 1 mV
Lead Channel Sensing Intrinsic Amplitude: 5.1 mV
Lead Channel Setting Pacing Amplitude: 1.75 V
Lead Channel Setting Pacing Amplitude: 2 V
Lead Channel Setting Pacing Amplitude: 2.5 V
Lead Channel Setting Pacing Pulse Width: 0.5 ms
Lead Channel Setting Pacing Pulse Width: 1 ms
Lead Channel Setting Sensing Sensitivity: 4 mV
Pulse Gen Model: 3562
Pulse Gen Serial Number: 9097773

## 2023-02-11 ENCOUNTER — Telehealth: Payer: Self-pay | Admitting: Internal Medicine

## 2023-02-11 ENCOUNTER — Ambulatory Visit (INDEPENDENT_AMBULATORY_CARE_PROVIDER_SITE_OTHER): Payer: Medicare Other | Admitting: Internal Medicine

## 2023-02-11 ENCOUNTER — Encounter: Payer: Self-pay | Admitting: Internal Medicine

## 2023-02-11 VITALS — BP 122/74 | HR 55 | Temp 97.9°F | Ht 71.5 in | Wt 162.0 lb

## 2023-02-11 DIAGNOSIS — E538 Deficiency of other specified B group vitamins: Secondary | ICD-10-CM | POA: Diagnosis not present

## 2023-02-11 DIAGNOSIS — I10 Essential (primary) hypertension: Secondary | ICD-10-CM | POA: Diagnosis not present

## 2023-02-11 DIAGNOSIS — N1831 Chronic kidney disease, stage 3a: Secondary | ICD-10-CM

## 2023-02-11 DIAGNOSIS — R739 Hyperglycemia, unspecified: Secondary | ICD-10-CM | POA: Diagnosis not present

## 2023-02-11 DIAGNOSIS — E785 Hyperlipidemia, unspecified: Secondary | ICD-10-CM | POA: Diagnosis not present

## 2023-02-11 NOTE — Progress Notes (Unsigned)
Patient ID: [name], male   DOB: Dec 22, 1931, 89 y.o.   MRN: [id number]        Chief Complaint: follow up ckd3a, hld, hyperglycemia, htn, low b12       HPI:  [name] is a 89 y.o. male here overall doing ok,  Pt denies chest pain, increased sob or doe, wheezing, orthopnea, PND, increased LE swelling, palpitations, dizziness or syncope.   Pt denies polydipsia, polyuria, or new focal neuro s/s.    Pt denies fever, wt loss, night sweats, loss of appetite, or other constitutional symptoms  Has ongoing intemrittent constipation, better recently.  Denies worsening reflux, abd pain, dysphagia, n/v, bowel change or blood.  Wt Readings from Last 3 Encounters:  02/11/23 162 lb (73.5 kg)  02/05/23 168 lb (76.2 kg)  01/16/23 161 lb (73 kg)   BP Readings from Last 3 Encounters:  02/11/23 122/74  02/05/23 (!) 146/84  01/16/23 120/76         Past Medical History:  Diagnosis Date   AICD (automatic cardioverter/defibrillator) present    "turned it off today, put in an extra lead,  and made it just a pacemaker" (08/21/2018)   Anemia, unspecified    Aortic stenosis    Mild, echo, October, 2011   Arthritis    "joints; arms"  (08/21/2018)   AV block, complete (HCC)    Permanent pacemaker   CAD (coronary artery disease)    DES to LAD 2005 /  DES to LAD 2008   Cardiomyopathy    Ischemic, EF 30%, 2005   Chronic bronchitis (HCC)    DJD (degenerative joint disease)    Dyslipidemia    Ejection fraction < 50%    EF 30%, echo, 2005  /  EF 30%, catheterization, 2008   GERD (gastroesophageal reflux disease)    Hyperlipidemia    Hypertension    Hypothyroidism    ICD (implantable cardiac defibrillator), single, in situ    for syncope and arrhythmia 2005 / generator change June, 2011   Iron deficiency anemia 06/17/2018   LBBB (left bundle branch block)    Mitral regurgitation    Mild, echo, October, 2011   Myocardial infarction [facility]) 1989   Presence of permanent cardiac pacemaker     Prostate cancer [facility])    Rectal fissure    Syncope    2005, ICD placed   Systolic CHF, chronic (HCC)    Vitamin B12 deficiency    Past Surgical History:  Procedure Laterality Date   BIV UPGRADE  08/21/2018   BIV UPGRADE N/A 08/21/2018   Procedure: BIV UPGRADE;  Surgeon: Marinus Maw, MD;  Location: [facility];  Service: Cardiovascular;  Laterality: N/A;   CARDIAC CATHETERIZATION  2008   ICD IMPLANT  2005   INSERTION PROSTATE RADIATION SEED     LAPAROSCOPIC CHOLECYSTECTOMY  04/2005   Dr. Ezzard Standing   PROSTATE BIOPSY     RECTAL DILATATION     "stretched it twice" (08/21/2018)   SHOULDER OPEN ROTATOR CUFF REPAIR Right     reports that he quit smoking about 70 years ago. His smoking use included cigarettes. He has a 2.00 pack-year smoking history. He has never used smokeless tobacco. He reports that he does not drink alcohol and does not use drugs. family history includes Breast cancer in his maternal aunt; Cancer (age of onset: 32) in his mother; Heart Problems in his brother and father; Heart attack in his brother; Heart attack (age of onset: 38) in his  father; Lung cancer in his brother and sister; Other in his sister; Stroke in his brother and mother. Allergies  Allergen Reactions   Pantoprazole Other (See Comments)    B12 levels dropped on medication   Zetia [Ezetimibe] Other (See Comments)    Arm weakness   Current Outpatient Medications on File Prior to Visit  Medication Sig Dispense Refill   acetaminophen (TYLENOL) 650 MG CR tablet Take 1,300 mg by mouth 2 (two) times daily.     amLODipine (NORVASC) 2.5 MG tablet Take 1 tablet (2.5 mg total) by mouth daily. 90 tablet 3   CALCIUM PO Take 2 tablets by mouth daily.      Cholecalciferol (VITAMIN D) 50 MCG (2000 UT) CAPS Take 2,000 Units by mouth daily.     fish oil-omega-3 fatty acids 1000 MG capsule Take 1 g by mouth 2 (two) times daily.     gemfibrozil (LOPID) 600 MG tablet TAKE 1 TABLET BY MOUTH DAILY. GENERIC EQUIVALENT  FOR LOPID (Patient taking differently: Take 600 mg by mouth daily. TAKE 1 TABLET BY MOUTH DAILY. GENERIC EQUIVALENT FOR LOPID) 90 tablet 3   Multiple Vitamins-Minerals (MENS MULTIVITAMIN PLUS PO) Take 1 tablet by mouth daily.     nystatin cream (MYCOSTATIN) Apply 1 Application topically daily as needed for dry skin.     omeprazole (PRILOSEC) 20 MG capsule Take 1 capsule (20 mg total) by mouth daily. 90 capsule 3   polyethylene glycol (MIRALAX / GLYCOLAX) packet Take 17 g by mouth daily as needed for mild constipation. Take 17g mixed with liquid once daily, may do a second dose as needed for constipation     No current facility-administered medications on file prior to visit.        ROS:  All others reviewed and negative.  Objective        PE:  BP 122/74 (BP Location: Left Arm, Patient Position: Sitting, Cuff Size: Normal)   Pulse (!) 55   Temp 97.9 F (36.6 C) (Oral)   Ht 5' 11.5" (1.816 m)   Wt 162 lb (73.5 kg)   SpO2 98%   BMI 22.28 kg/m                 Constitutional: Pt appears in NAD               HENT: Head: NCAT.                Right Ear: External ear normal.                 Left Ear: External ear normal.                Eyes: . Pupils are equal, round, and reactive to light. Conjunctivae and EOM are normal               Nose: without d/c or deformity               Neck: Neck supple. Gross normal ROM               Cardiovascular: Normal rate and regular rhythm.                 Pulmonary/Chest: Effort normal and breath sounds without rales or wheezing.                Abd:  Soft, NT, ND, + BS, no organomegaly               Neurological: Pt is  alert. At baseline orientation, motor grossly intact               Skin: Skin is warm. No rashes, no other new lesions, LE edema - none               Psychiatric: Pt behavior is normal without agitation   Micro: none  Cardiac tracings I have personally interpreted today:  none  Pertinent Radiological findings (summarize): none   Lab  Results  Component Value Date   WBC 5.8 10/26/2022   HGB 10.3 (L) 10/26/2022   HCT 29.5 (L) 10/26/2022   PLT 301.0 10/26/2022   GLUCOSE 97 10/26/2022   CHOL 188 08/08/2022   TRIG 206.0 (H) 08/08/2022   HDL 45.40 08/08/2022   LDLDIRECT 95.0 08/08/2022   LDLCALC 96 01/30/2022   ALT 13 10/26/2022   AST 20 10/26/2022   NA 139 10/26/2022   K 5.2 No hemolysis seen (H) 10/26/2022   CL 102 10/26/2022   CREATININE 1.66 (H) 10/26/2022   BUN 23 10/26/2022   CO2 30 10/26/2022   TSH 1.023 10/16/2022   PSA 0.00 (L) 01/24/2021   INR 0.95 01/06/2010   HGBA1C 5.7 08/08/2022   Assessment/Plan:  PHIL KOZAR is a 89 y.o. White or Caucasian [1] male with  has a past medical history of AICD (automatic cardioverter/defibrillator) present, Anemia, unspecified, Aortic stenosis, Arthritis, AV block, complete (HCC), CAD (coronary artery disease), Cardiomyopathy, Chronic bronchitis (HCC), DJD (degenerative joint disease), Dyslipidemia, Ejection fraction < 50%, GERD (gastroesophageal reflux disease), Hyperlipidemia, Hypertension, Hypothyroidism, ICD (implantable cardiac defibrillator), single, in situ, Iron deficiency anemia (06/17/2018), LBBB (left bundle branch block), Mitral regurgitation, Myocardial infarction (HCC) (1989), Presence of permanent cardiac pacemaker, Prostate cancer (HCC), Rectal fissure, Syncope, Systolic CHF, chronic (HCC), and Vitamin B12 deficiency.  CKD (chronic kidney disease) stage 3, GFR 30-59 ml/min (HCC) Lab Results  Component Value Date   CREATININE 1.66 (H) 10/26/2022   Stable overall, cont to avoid nephrotoxins   Dyslipidemia Lab Results  Component Value Date   LDLCALC 96 01/30/2022   Uncontrolled,, pt to continue current statin zocor 40 every day, lopid 600mg , declines change in tx for now, for lower chol diet   Hyperglycemia Lab Results  Component Value Date   HGBA1C 5.7 08/08/2022   Stable, pt to continue current medical treatment  - diet, wt  control   Hypertension BP Readings from Last 3 Encounters:  02/11/23 122/74  02/05/23 (!) 146/84  01/16/23 120/76   Stable, pt to continue medical treatment norvasc 2.5 every day, toprol xl 50 qd   Vitamin B 12 deficiency Lab Results  Component Value Date   VITAMINB12 >1500 (H) 08/08/2022   Stable, cont oral replacement - b12 1000 mcg qd  Followup: Return in about 4 months (around 06/14/2023).  Oliver Barre, MD 02/12/2023 9:09 PM [facility] Medicine

## 2023-02-11 NOTE — Telephone Encounter (Signed)
Prescription Request  02/11/2023  LOV: 02/11/2023  What is the name of the medication or equipment?  clopidogrel (PLAVIX) 75 MG tablet  metoprolol succinate (TOPROL-XL) 50 MG 24 hr tablet  simvastatin (ZOCOR) 40 MG tablet  Ramipril 10 mg  Have you contacted your pharmacy to request a refill? Yes   Which pharmacy would you like this sent to?  [facility]) [facility] - [place], [place] - [address] [place] [address] Phone: [phone] Fax: [phone]   Patient notified that their request is being sent to the clinical staff for review and that they should receive a response within 2 business days.   Please advise at [facility] [phone]

## 2023-02-11 NOTE — Patient Instructions (Addendum)
Please have your Shingrix (shingles) shots done at your local pharmacy.  Please continue all other medications as before, and refills have been done if requested.  Please have the pharmacy call with any other refills you may need.  Please continue your efforts at being more active, low cholesterol diet, and weight control.  Please keep your appointments with your specialists as you may have planned  Please make an Appointment to return in 4 months, or sooner if needed

## 2023-02-12 ENCOUNTER — Encounter: Payer: Self-pay | Admitting: Internal Medicine

## 2023-02-12 ENCOUNTER — Other Ambulatory Visit: Payer: Self-pay

## 2023-02-12 MED ORDER — CLOPIDOGREL BISULFATE 75 MG PO TABS: 75.0000 mg | ORAL_TABLET | Freq: Every day | ORAL | 3 refills | Status: DC

## 2023-02-12 MED ORDER — LEVOTHYROXINE SODIUM 50 MCG PO TABS: 50.0000 ug | ORAL_TABLET | Freq: Every day | ORAL | 3 refills | Status: DC

## 2023-02-12 MED ORDER — SIMVASTATIN 40 MG PO TABS: 40.0000 mg | ORAL_TABLET | Freq: Every day | ORAL | 3 refills | Status: DC

## 2023-02-12 MED ORDER — METOPROLOL SUCCINATE ER 50 MG PO TB24: 50.0000 mg | ORAL_TABLET | Freq: Every day | ORAL | 3 refills | Status: DC

## 2023-02-12 NOTE — Assessment & Plan Note (Signed)
BP Readings from Last 3 Encounters:  02/11/23 122/74  02/05/23 (!) 146/84  01/16/23 120/76   Stable, pt to continue medical treatment norvasc 2.5 every day, toprol xl 50 qd

## 2023-02-12 NOTE — Assessment & Plan Note (Signed)
Lab Results  Component Value Date   VITAMINB12 >1500 (H) 08/08/2022   Stable, cont oral replacement - b12 1000 mcg qd 

## 2023-02-12 NOTE — Assessment & Plan Note (Signed)
Lab Results  Component Value Date   LDLCALC 96 01/30/2022   Uncontrolled,, pt to continue current statin zocor 40 every day, lopid 600mg , declines change in tx for now, for lower chol diet

## 2023-02-12 NOTE — Assessment & Plan Note (Signed)
Lab Results  Component Value Date   HGBA1C 5.7 08/08/2022   Stable, pt to continue current medical treatment  - diet, wt control

## 2023-02-12 NOTE — Assessment & Plan Note (Signed)
Lab Results  Component Value Date   CREATININE 1.66 (H) 10/26/2022   Stable overall, cont to avoid nephrotoxins

## 2023-02-12 NOTE — Telephone Encounter (Signed)
Refill Sent. 

## 2023-02-15 ENCOUNTER — Ambulatory Visit: Payer: Medicare Other

## 2023-02-15 DIAGNOSIS — E538 Deficiency of other specified B group vitamins: Secondary | ICD-10-CM

## 2023-02-15 MED ORDER — CYANOCOBALAMIN 1000 MCG/ML IJ SOLN
1000.0000 ug | Freq: Once | INTRAMUSCULAR | Status: AC
  Administered 2023-02-15: 1000 ug via INTRAMUSCULAR

## 2023-02-15 NOTE — Progress Notes (Signed)
Pt here for monthly B12 injection per   B12 1000mcg given IM and pt tolerated injection well.    

## 2023-02-28 NOTE — Telephone Encounter (Signed)
Patient's daughter Velna Hatchet called and said the pharmacy told them that they had not received the prescriptions. They would like to know if the medications listed as well as levothyroxine (SYNTHROID) 50 MCG tablet can be re-sent to the pharmacy. Velna Hatchet also wanted to know if  the Plavix can be sent to [facility], but also send a small amount to another pharmacy because the patient only has 2 pills left. Velna Hatchet would like a call back at [phone].

## 2023-03-05 ENCOUNTER — Other Ambulatory Visit: Payer: Self-pay

## 2023-03-05 ENCOUNTER — Encounter: Payer: Self-pay | Admitting: Internal Medicine

## 2023-03-05 ENCOUNTER — Telehealth: Payer: Self-pay | Admitting: Internal Medicine

## 2023-03-05 MED ORDER — CLOPIDOGREL BISULFATE 75 MG PO TABS: 75.0000 mg | ORAL_TABLET | Freq: Every day | ORAL | 3 refills | Status: DC

## 2023-03-05 MED ORDER — METOPROLOL SUCCINATE ER 50 MG PO TB24: 50.0000 mg | ORAL_TABLET | Freq: Every day | ORAL | 3 refills | Status: DC

## 2023-03-05 MED ORDER — SIMVASTATIN 40 MG PO TABS: 40.0000 mg | ORAL_TABLET | Freq: Every day | ORAL | 3 refills | Status: DC

## 2023-03-05 MED ORDER — LEVOTHYROXINE SODIUM 50 MCG PO TABS: 50.0000 ug | ORAL_TABLET | Freq: Every day | ORAL | 3 refills | Status: DC

## 2023-03-05 NOTE — Telephone Encounter (Signed)
Refill Sent. 

## 2023-03-05 NOTE — Telephone Encounter (Signed)
Patient would like to be called when this is sent.

## 2023-03-05 NOTE — Telephone Encounter (Signed)
Patient states that the medications that were sent on 02/12/2023 were never received by the pharmacy - please send again.  Patient is completely out of plavix

## 2023-03-05 NOTE — Telephone Encounter (Signed)
They have been resent.

## 2023-03-18 ENCOUNTER — Ambulatory Visit: Payer: Medicare Other

## 2023-03-18 DIAGNOSIS — E538 Deficiency of other specified B group vitamins: Secondary | ICD-10-CM | POA: Diagnosis not present

## 2023-03-18 MED ORDER — CYANOCOBALAMIN 1000 MCG/ML IJ SOLN
1000.0000 ug | Freq: Once | INTRAMUSCULAR | Status: AC
Start: 2023-03-18 — End: 2023-03-18
  Administered 2023-03-18: 1000 ug via INTRAMUSCULAR

## 2023-03-18 NOTE — Progress Notes (Signed)
Pt was given monthly B12 injec w/o any complications.

## 2023-03-21 ENCOUNTER — Encounter (INDEPENDENT_AMBULATORY_CARE_PROVIDER_SITE_OTHER): Payer: Self-pay

## 2023-04-11 ENCOUNTER — Other Ambulatory Visit: Payer: Self-pay

## 2023-04-11 ENCOUNTER — Ambulatory Visit (INDEPENDENT_AMBULATORY_CARE_PROVIDER_SITE_OTHER): Payer: Medicare Other

## 2023-04-11 ENCOUNTER — Telehealth: Payer: Self-pay | Admitting: Internal Medicine

## 2023-04-11 DIAGNOSIS — I442 Atrioventricular block, complete: Secondary | ICD-10-CM

## 2023-04-11 DIAGNOSIS — I2510 Atherosclerotic heart disease of native coronary artery without angina pectoris: Secondary | ICD-10-CM

## 2023-04-11 LAB — CUP PACEART REMOTE DEVICE CHECK
Battery Remaining Longevity: 29 mo
Battery Remaining Percentage: 36 %
Battery Voltage: 2.95 V
Brady Statistic AP VP Percent: 90 %
Brady Statistic AP VS Percent: 1 %
Brady Statistic AS VP Percent: 9.8 %
Brady Statistic AS VS Percent: 1 %
Brady Statistic RA Percent Paced: 89 %
Date Time Interrogation Session: 20240905020013
Implantable Lead Connection Status: 753985
Implantable Lead Connection Status: 753985
Implantable Lead Connection Status: 753985
Implantable Lead Implant Date: 20050509
Implantable Lead Implant Date: 20050509
Implantable Lead Implant Date: 20200116
Implantable Lead Location: 753858
Implantable Lead Location: 753859
Implantable Lead Location: 753860
Implantable Lead Model: 1580
Implantable Pulse Generator Implant Date: 20200116
Lead Channel Impedance Value: 280 Ohm
Lead Channel Impedance Value: 390 Ohm
Lead Channel Impedance Value: 710 Ohm
Lead Channel Pacing Threshold Amplitude: 0.75 V
Lead Channel Pacing Threshold Amplitude: 1 V
Lead Channel Pacing Threshold Amplitude: 1 V
Lead Channel Pacing Threshold Pulse Width: 0.5 ms
Lead Channel Pacing Threshold Pulse Width: 0.5 ms
Lead Channel Pacing Threshold Pulse Width: 1 ms
Lead Channel Sensing Intrinsic Amplitude: 1 mV
Lead Channel Sensing Intrinsic Amplitude: 5.1 mV
Lead Channel Setting Pacing Amplitude: 1.75 V
Lead Channel Setting Pacing Amplitude: 2 V
Lead Channel Setting Pacing Amplitude: 2.5 V
Lead Channel Setting Pacing Pulse Width: 0.5 ms
Lead Channel Setting Pacing Pulse Width: 1 ms
Lead Channel Setting Sensing Sensitivity: 4 mV
Pulse Gen Model: 3562
Pulse Gen Serial Number: 9097773

## 2023-04-11 MED ORDER — GEMFIBROZIL 600 MG PO TABS
ORAL_TABLET | ORAL | 3 refills | Status: DC
Start: 2023-04-11 — End: 2024-04-20

## 2023-04-11 NOTE — Telephone Encounter (Signed)
Refill sent.

## 2023-04-11 NOTE — Telephone Encounter (Signed)
Prescription Request  04/11/2023  LOV: 02/11/2023  What is the name of the medication or equipment? gemfibrozil  Have you contacted your pharmacy to request a refill? Yes   Which pharmacy would you like this sent to?  [facility] - [place], [place] - [address] [place] [address] Phone: [phone] Fax: [phone]     Patient notified that their request is being sent to the clinical staff for review and that they should receive a response within 2 business days.   Please advise at Mobile [phone] (mobile)

## 2023-04-15 ENCOUNTER — Other Ambulatory Visit: Payer: Self-pay | Admitting: Internal Medicine

## 2023-04-17 NOTE — Progress Notes (Signed)
Remote pacemaker transmission.   

## 2023-04-18 ENCOUNTER — Ambulatory Visit (INDEPENDENT_AMBULATORY_CARE_PROVIDER_SITE_OTHER): Payer: Medicare Other

## 2023-04-18 ENCOUNTER — Encounter: Payer: Self-pay | Admitting: Internal Medicine

## 2023-04-18 DIAGNOSIS — Z23 Encounter for immunization: Secondary | ICD-10-CM | POA: Diagnosis not present

## 2023-04-18 DIAGNOSIS — E538 Deficiency of other specified B group vitamins: Secondary | ICD-10-CM | POA: Diagnosis not present

## 2023-04-18 MED ORDER — CYANOCOBALAMIN 1000 MCG/ML IJ SOLN
1000.0000 ug | Freq: Once | INTRAMUSCULAR | Status: AC
Start: 2023-04-18 — End: 2023-04-18
  Administered 2023-04-18: 1000 ug via INTRAMUSCULAR

## 2023-04-18 NOTE — Progress Notes (Signed)
Pt here for monthly B12 injection per Dr. Jonny Ruiz   B12 given IM and pt tolerated injection well.  Next B12 injection scheduled for 10/14  Patient was advised to report to the office immediately if he noticed any adverse reactions.   Patient was also give his HD Flu vaccine. Patient tolerated his vaccine very well and injection site looked fine.

## 2023-04-19 MED ORDER — RAMIPRIL 10 MG PO CAPS: 10.0000 mg | ORAL_CAPSULE | Freq: Every day | ORAL | 3 refills | Status: DC

## 2023-05-20 ENCOUNTER — Ambulatory Visit (INDEPENDENT_AMBULATORY_CARE_PROVIDER_SITE_OTHER): Payer: Medicare Other

## 2023-05-20 DIAGNOSIS — E538 Deficiency of other specified B group vitamins: Secondary | ICD-10-CM | POA: Diagnosis not present

## 2023-05-20 MED ORDER — CYANOCOBALAMIN 1000 MCG/ML IJ SOLN
1000.0000 ug | Freq: Once | INTRAMUSCULAR | Status: AC
Start: 2023-05-20 — End: 2023-05-20
  Administered 2023-05-20: 1000 ug via INTRAMUSCULAR

## 2023-05-20 NOTE — Progress Notes (Signed)
After obtaining consent, and per orders of Dr. Jonny Ruiz, injection of B12 given by Ferdie Ping. Patient instructed to report any adverse reaction to me immediately.

## 2023-06-14 ENCOUNTER — Encounter: Payer: Self-pay | Admitting: Internal Medicine

## 2023-06-14 ENCOUNTER — Encounter (HOSPITAL_COMMUNITY): Payer: Self-pay | Admitting: *Deleted

## 2023-06-14 ENCOUNTER — Ambulatory Visit (INDEPENDENT_AMBULATORY_CARE_PROVIDER_SITE_OTHER): Payer: Medicare Other | Admitting: Internal Medicine

## 2023-06-14 ENCOUNTER — Emergency Department (HOSPITAL_COMMUNITY)
Admission: EM | Admit: 2023-06-14 | Discharge: 2023-06-15 | Disposition: A | Discharge status: Home / Self Care | Payer: Medicare Other | Attending: Emergency Medicine | Admitting: Emergency Medicine

## 2023-06-14 ENCOUNTER — Other Ambulatory Visit: Payer: Self-pay

## 2023-06-14 VITALS — BP 130/68 | HR 53 | Temp 98.3°F | Ht 71.5 in | Wt 164.0 lb

## 2023-06-14 DIAGNOSIS — E875 Hyperkalemia: Secondary | ICD-10-CM | POA: Insufficient documentation

## 2023-06-14 DIAGNOSIS — E039 Hypothyroidism, unspecified: Secondary | ICD-10-CM | POA: Diagnosis not present

## 2023-06-14 DIAGNOSIS — R413 Other amnesia: Secondary | ICD-10-CM | POA: Diagnosis not present

## 2023-06-14 DIAGNOSIS — Z7902 Long term (current) use of antithrombotics/antiplatelets: Secondary | ICD-10-CM | POA: Diagnosis not present

## 2023-06-14 DIAGNOSIS — E538 Deficiency of other specified B group vitamins: Secondary | ICD-10-CM

## 2023-06-14 DIAGNOSIS — N189 Chronic kidney disease, unspecified: Secondary | ICD-10-CM | POA: Diagnosis not present

## 2023-06-14 DIAGNOSIS — D509 Iron deficiency anemia, unspecified: Secondary | ICD-10-CM | POA: Diagnosis not present

## 2023-06-14 DIAGNOSIS — Z79899 Other long term (current) drug therapy: Secondary | ICD-10-CM | POA: Insufficient documentation

## 2023-06-14 DIAGNOSIS — I5022 Chronic systolic (congestive) heart failure: Secondary | ICD-10-CM | POA: Diagnosis not present

## 2023-06-14 DIAGNOSIS — Z8546 Personal history of malignant neoplasm of prostate: Secondary | ICD-10-CM | POA: Diagnosis not present

## 2023-06-14 DIAGNOSIS — N179 Acute kidney failure, unspecified: Secondary | ICD-10-CM | POA: Insufficient documentation

## 2023-06-14 DIAGNOSIS — N1831 Chronic kidney disease, stage 3a: Secondary | ICD-10-CM

## 2023-06-14 DIAGNOSIS — I2510 Atherosclerotic heart disease of native coronary artery without angina pectoris: Secondary | ICD-10-CM | POA: Diagnosis not present

## 2023-06-14 DIAGNOSIS — I10 Essential (primary) hypertension: Secondary | ICD-10-CM

## 2023-06-14 DIAGNOSIS — I130 Hypertensive heart and chronic kidney disease with heart failure and stage 1 through stage 4 chronic kidney disease, or unspecified chronic kidney disease: Secondary | ICD-10-CM | POA: Diagnosis not present

## 2023-06-14 DIAGNOSIS — R739 Hyperglycemia, unspecified: Secondary | ICD-10-CM | POA: Diagnosis not present

## 2023-06-14 LAB — COMPREHENSIVE METABOLIC PANEL
ALT: 12 U/L (ref 0–44)
AST: 24 U/L (ref 15–41)
Albumin: 4.1 g/dL (ref 3.5–5.0)
Alkaline Phosphatase: 53 U/L (ref 38–126)
Anion gap: 12 (ref 5–15)
BUN: 29 mg/dL — ABNORMAL HIGH (ref 8–23)
CO2: 20 mmol/L — ABNORMAL LOW (ref 22–32)
Calcium: 9.6 mg/dL (ref 8.9–10.3)
Chloride: 110 mmol/L (ref 98–111)
Creatinine, Ser: 2.43 mg/dL — ABNORMAL HIGH (ref 0.61–1.24)
GFR, Estimated: 25 mL/min — ABNORMAL LOW (ref >60)
Glucose, Bld: 105 mg/dL — ABNORMAL HIGH (ref 70–99)
Potassium: 5.5 mmol/L — ABNORMAL HIGH (ref 3.5–5.1)
Sodium: 142 mmol/L (ref 135–145)
Total Bilirubin: 0.9 mg/dL (ref <1.2)
Total Protein: 6.4 g/dL — ABNORMAL LOW (ref 6.5–8.1)

## 2023-06-14 LAB — CBC
HCT: 24.6 % — ABNORMAL LOW (ref 39.0–52.0)
Hemoglobin: 7.9 g/dL — ABNORMAL LOW (ref 13.0–17.0)
MCH: 32.4 pg (ref 26.0–34.0)
MCHC: 32.1 g/dL (ref 30.0–36.0)
MCV: 100.8 fL — ABNORMAL HIGH (ref 80.0–100.0)
Platelets: 186 10*3/uL (ref 150–400)
RBC: 2.44 MIL/uL — ABNORMAL LOW (ref 4.22–5.81)
RDW: 13 % (ref 11.5–15.5)
WBC: 4.2 10*3/uL (ref 4.0–10.5)
nRBC: 0 % (ref 0.0–0.2)

## 2023-06-14 LAB — IBC PANEL
Iron: 113 ug/dL (ref 42–165)
Saturation Ratios: 31.4 % (ref 20.0–50.0)
TIBC: 359.8 ug/dL (ref 250.0–450.0)
Transferrin: 257 mg/dL (ref 212.0–360.0)

## 2023-06-14 LAB — HEPATIC FUNCTION PANEL
ALT: 9 U/L (ref 0–53)
AST: 21 U/L (ref 0–37)
Albumin: 4.6 g/dL (ref 3.5–5.2)
Alkaline Phosphatase: 61 U/L (ref 39–117)
Bilirubin, Direct: 0.1 mg/dL (ref 0.0–0.3)
Total Bilirubin: 0.7 mg/dL (ref 0.2–1.2)
Total Protein: 7.1 g/dL (ref 6.0–8.3)

## 2023-06-14 LAB — CBC WITH DIFFERENTIAL/PLATELET
Basophils Absolute: 0 10*3/uL (ref 0.0–0.1)
Basophils Relative: 0.6 % (ref 0.0–3.0)
Eosinophils Absolute: 0.2 10*3/uL (ref 0.0–0.7)
Eosinophils Relative: 5.2 % — ABNORMAL HIGH (ref 0.0–5.0)
HCT: 27 % — ABNORMAL LOW (ref 39.0–52.0)
Hemoglobin: 9.4 g/dL — ABNORMAL LOW (ref 13.0–17.0)
Lymphocytes Relative: 24.6 % (ref 12.0–46.0)
Lymphs Abs: 1.1 10*3/uL (ref 0.7–4.0)
MCHC: 34.9 g/dL (ref 30.0–36.0)
MCV: 97.6 fL (ref 78.0–100.0)
Monocytes Absolute: 0.4 10*3/uL (ref 0.1–1.0)
Monocytes Relative: 9.1 % (ref 3.0–12.0)
Neutro Abs: 2.6 10*3/uL (ref 1.4–7.7)
Neutrophils Relative %: 60.5 % (ref 43.0–77.0)
Platelets: 211 10*3/uL (ref 150.0–400.0)
RBC: 2.76 Mil/uL — ABNORMAL LOW (ref 4.22–5.81)
RDW: 13.1 % (ref 11.5–15.5)
WBC: 4.3 10*3/uL (ref 4.0–10.5)

## 2023-06-14 LAB — LIPID PANEL
Cholesterol: 161 mg/dL (ref 0–200)
HDL: 40.3 mg/dL (ref >39.00)
LDL Cholesterol: 88 mg/dL (ref 0–99)
NonHDL: 121.06
Total CHOL/HDL Ratio: 4
Triglycerides: 163 mg/dL — ABNORMAL HIGH (ref 0.0–149.0)
VLDL: 32.6 mg/dL (ref 0.0–40.0)

## 2023-06-14 LAB — BASIC METABOLIC PANEL
BUN: 27 mg/dL — ABNORMAL HIGH (ref 6–23)
CO2: 26 meq/L (ref 19–32)
Calcium: 9.9 mg/dL (ref 8.4–10.5)
Chloride: 107 meq/L (ref 96–112)
Creatinine, Ser: 1.83 mg/dL — ABNORMAL HIGH (ref 0.40–1.50)
GFR: 32.43 mL/min — ABNORMAL LOW (ref >60.00)
Glucose, Bld: 101 mg/dL — ABNORMAL HIGH (ref 70–99)
Potassium: 6 meq/L — ABNORMAL HIGH (ref 3.5–5.1)
Sodium: 140 meq/L (ref 135–145)

## 2023-06-14 LAB — HEMOGLOBIN A1C: Hgb A1c MFr Bld: 5.4 % (ref 4.6–6.5)

## 2023-06-14 LAB — TSH: TSH: 3.21 u[IU]/mL (ref 0.35–5.50)

## 2023-06-14 LAB — FERRITIN: Ferritin: 171.8 ng/mL (ref 22.0–322.0)

## 2023-06-14 NOTE — Assessment & Plan Note (Signed)
Mild overall, most c/w worsening benign forgetfulness it seems, but can't r/o other - pt declines MRI or neuro for now

## 2023-06-14 NOTE — ED Triage Notes (Signed)
The pt went to the doctor for a routine physical  and they were called to tell them that the pts potassium was high and that he needed to come to the hospital

## 2023-06-14 NOTE — ED Provider Triage Note (Signed)
Emergency Medicine Provider Triage Evaluation Note  [name] , a 89 y.o. male  was evaluated in triage.  Patient comes in at request of PCP.  Reports PCP noted his potassium was very high today and recommended he come to the ER for further evaluation.  He denies any chest pain, shortness of breath, or palpitations.  Feels at his baseline.  Review of Systems  Positive: As above Negative: As above  Physical Exam  BP (!) 147/63 (BP Location: Left Arm)   Pulse (!) 51   Temp 97.9 F (36.6 C) (Oral)   Resp 16  Gen:   Awake, no distress   Resp:  Normal effort  MSK:   Moves extremities without difficulty  Other:  Regular rate and rhythm on cardiac auscultation  Medical Decision Making  Medically screening exam initiated at 5:59 PM.  Appropriate orders placed.  DEVANTAE SELLMAN was informed that the remainder of the evaluation will be completed by another provider, this initial triage assessment does not replace that evaluation, and the importance of remaining in the ED until their evaluation is complete.     Arabella Merles, PA-C 06/14/23 1800

## 2023-06-14 NOTE — Patient Instructions (Addendum)
Please have your Shingrix (shingles) shots done at your local pharmacy.  Please continue all other medications as before, and refills have been done if requested.  Please have the pharmacy call with any other refills you may need.  Please continue your efforts at being more active, low cholesterol diet, and weight control.  Please keep your appointments with your specialists as you may have planned  Please go to the LAB at the blood drawing area for the tests to be done  You will be contacted by phone if any changes need to be made immediately.  Otherwise, you will receive a letter about your results with an explanation, but please check with MyChart first.  Please make an Appointment to return in 6 months, or sooner if needed

## 2023-06-14 NOTE — Assessment & Plan Note (Signed)
Lab Results  Component Value Date   VITAMINB12 >1500 (H) 08/08/2022   Stable, cont oral replacement - b12 1000 mcg qd

## 2023-06-14 NOTE — Progress Notes (Unsigned)
Patient ID: [name], male   DOB: 12-Jul-1932, 89 y.o.   MRN: [id number]        Chief Complaint: follow up HTN, HLD and hyperglycemia , iron def anemia, low thyroid, ckd3a, memory changes, b12 deficiency       HPI:  [name] is a 89 y.o. male overall doing ok, Pt denies chest pain, increased sob or doe, wheezing, orthopnea, PND, increased LE swelling, palpitations, dizziness or syncope.   Pt denies polydipsia, polyuria, or new focal neuro s/s.    Pt denies fever, wt loss, night sweats, loss of appetite, or other constitutional symptoms  no overt bleeding.  Denies hyper or hypo thyroid symptoms such as voice, skin or hair change. Has had memory changes with increasing forgetfulness.         Wt Readings from Last 3 Encounters:  06/14/23 164 lb 0.4 oz (74.4 kg)  06/14/23 164 lb (74.4 kg)  02/11/23 162 lb (73.5 kg)   BP Readings from Last 3 Encounters:  06/15/23 (!) 151/68  06/14/23 130/68  02/11/23 122/74         Past Medical History:  Diagnosis Date   AICD (automatic cardioverter/defibrillator) present    "turned it off today, put in an extra lead,  and made it just a pacemaker" (08/21/2018)   Anemia, unspecified    Aortic stenosis    Mild, echo, October, 2011   Arthritis    "joints; arms"  (08/21/2018)   AV block, complete (HCC)    Permanent pacemaker   CAD (coronary artery disease)    DES to LAD 2005 /  DES to LAD 2008   Cardiomyopathy    Ischemic, EF 30%, 2005   Chronic bronchitis (HCC)    DJD (degenerative joint disease)    Dyslipidemia    Ejection fraction < 50%    EF 30%, echo, 2005  /  EF 30%, catheterization, 2008   GERD (gastroesophageal reflux disease)    Hyperlipidemia    Hypertension    Hypothyroidism    ICD (implantable cardiac defibrillator), single, in situ    for syncope and arrhythmia 2005 / generator change June, 2011   Iron deficiency anemia 06/17/2018   LBBB (left bundle branch block)    Mitral regurgitation    Mild, echo, October, 2011    Myocardial infarction [facility]) 1989   Presence of permanent cardiac pacemaker    Prostate cancer [facility])    Rectal fissure    Syncope    2005, ICD placed   Systolic CHF, chronic (HCC)    Vitamin B12 deficiency    Past Surgical History:  Procedure Laterality Date   BIV UPGRADE  08/21/2018   BIV UPGRADE N/A 08/21/2018   Procedure: BIV UPGRADE;  Surgeon: Marinus Maw, MD;  Location: [facility] INVASIVE CV LAB;  Service: Cardiovascular;  Laterality: N/A;   CARDIAC CATHETERIZATION  2008   ICD IMPLANT  2005   INSERTION PROSTATE RADIATION SEED     LAPAROSCOPIC CHOLECYSTECTOMY  04/2005   Dr. Ezzard Standing   PROSTATE BIOPSY     RECTAL DILATATION     "stretched it twice" (08/21/2018)   SHOULDER OPEN ROTATOR CUFF REPAIR Right     reports that he quit smoking about 70 years ago. His smoking use included cigarettes. He started smoking about 72 years ago. He has a 2 pack-year smoking history. He has never used smokeless tobacco. He reports that he does not drink alcohol and does not use drugs. family history includes Breast cancer in his maternal  aunt; Cancer (age of onset: 49) in his mother; Heart Problems in his brother and father; Heart attack in his brother; Heart attack (age of onset: 76) in his father; Lung cancer in his brother and sister; Other in his sister; Stroke in his brother and mother. Allergies  Allergen Reactions   Pantoprazole Other (See Comments)    B12 levels dropped on medication   Zetia [Ezetimibe] Other (See Comments)    Arm weakness   Current Outpatient Medications on File Prior to Visit  Medication Sig Dispense Refill   acetaminophen (TYLENOL) 650 MG CR tablet Take 1,300 mg by mouth 2 (two) times daily.     amLODipine (NORVASC) 2.5 MG tablet Take 1 tablet (2.5 mg total) by mouth daily. 90 tablet 3   CALCIUM PO Take 2 tablets by mouth daily.      Cholecalciferol (VITAMIN D) 50 MCG (2000 UT) CAPS Take 2,000 Units by mouth daily.     clopidogrel (PLAVIX) 75 MG tablet Take 1 tablet (75  mg total) by mouth daily. 90 tablet 3   fish oil-omega-3 fatty acids 1000 MG capsule Take 1 g by mouth 2 (two) times daily.     gemfibrozil (LOPID) 600 MG tablet TAKE 1 TABLET BY MOUTH DAILY. GENERIC EQUIVALENT FOR LOPID 90 tablet 3   levothyroxine (SYNTHROID) 50 MCG tablet Take 1 tablet (50 mcg total) by mouth daily before breakfast. 90 tablet 3   metoprolol succinate (TOPROL-XL) 50 MG 24 hr tablet Take 1 tablet (50 mg total) by mouth daily. Take with or immediately following a meal. 90 tablet 3   Multiple Vitamins-Minerals (MENS MULTIVITAMIN PLUS PO) Take 1 tablet by mouth daily.     nystatin cream (MYCOSTATIN) Apply 1 Application topically daily as needed for dry skin.     omeprazole (PRILOSEC) 20 MG capsule Take 1 capsule (20 mg total) by mouth daily. 90 capsule 3   polyethylene glycol (MIRALAX / GLYCOLAX) packet Take 17 g by mouth daily as needed for mild constipation. Take 17g mixed with liquid once daily, may do a second dose as needed for constipation     ramipril (ALTACE) 10 MG capsule Take 1 capsule (10 mg total) by mouth daily. 90 capsule 3   simvastatin (ZOCOR) 40 MG tablet Take 1 tablet (40 mg total) by mouth at bedtime. 90 tablet 3   No current facility-administered medications on file prior to visit.        ROS:  All others reviewed and negative.  Objective        PE:  BP 130/68 (BP Location: Right Arm, Patient Position: Sitting, Cuff Size: Normal)   Pulse (!) 53   Temp 98.3 F (36.8 C) (Oral)   Ht 5' 11.5" (1.816 m)   Wt 164 lb (74.4 kg)   SpO2 98%   BMI 22.55 kg/m                 Constitutional: Pt appears in NAD               HENT: Head: NCAT.                Right Ear: External ear normal.                 Left Ear: External ear normal.                Eyes: . Pupils are equal, round, and reactive to light. Conjunctivae and EOM are normal  Nose: without d/c or deformity               Neck: Neck supple. Gross normal ROM               Cardiovascular:  Normal rate and regular rhythm.                 Pulmonary/Chest: Effort normal and breath sounds without rales or wheezing.                Abd:  Soft, NT, ND, + BS, no organomegaly               Neurological: Pt is alert. At baseline orientation, motor grossly intact               Skin: Skin is warm. No rashes, no other new lesions, LE edema - none               Psychiatric: Pt behavior is normal without agitation   Micro: none  Cardiac tracings I have personally interpreted today:  none  Pertinent Radiological findings (summarize): none   Lab Results  Component Value Date   WBC 4.2 06/14/2023   HGB 7.9 (L) 06/14/2023   HCT 24.6 (L) 06/14/2023   PLT 186 06/14/2023   GLUCOSE 105 (H) 06/14/2023   CHOL 161 06/14/2023   TRIG 163.0 (H) 06/14/2023   HDL 40.30 06/14/2023   LDLDIRECT 95.0 08/08/2022   LDLCALC 88 06/14/2023   ALT 12 06/14/2023   AST 24 06/14/2023   NA 142 06/14/2023   K 5.5 (H) 06/14/2023   CL 110 06/14/2023   CREATININE 2.43 (H) 06/14/2023   BUN 29 (H) 06/14/2023   CO2 20 (L) 06/14/2023   TSH 3.21 06/14/2023   PSA 0.00 (L) 01/24/2021   INR 0.95 01/06/2010   HGBA1C 5.4 06/14/2023   Assessment/Plan:  [name] is a 89 y.o. White or Caucasian [1] male with  has a past medical history of AICD (automatic cardioverter/defibrillator) present, Anemia, unspecified, Aortic stenosis, Arthritis, AV block, complete (HCC), CAD (coronary artery disease), Cardiomyopathy, Chronic bronchitis (HCC), DJD (degenerative joint disease), Dyslipidemia, Ejection fraction < 50%, GERD (gastroesophageal reflux disease), Hyperlipidemia, Hypertension, Hypothyroidism, ICD (implantable cardiac defibrillator), single, in situ, Iron deficiency anemia (06/17/2018), LBBB (left bundle branch block), Mitral regurgitation, Myocardial infarction (HCC) (1989), Presence of permanent cardiac pacemaker, Prostate cancer (HCC), Rectal fissure, Syncope, Systolic CHF, chronic (HCC), and Vitamin B12  deficiency.  Vitamin B 12 deficiency / Lab Results  Component Value Date   VITAMINB12 >1500 (H) 08/08/2022   Stable, cont oral replacement - b12 1000 mcg qd   Memory loss Mild overall, most c/w worsening benign forgetfulness it seems, but can't r/o other - pt declines MRI or neuro for now  Iron deficiency anemia Also for iron lab today  Hypothyroidism Lab Results  Component Value Date   TSH 3.21 06/14/2023   Stable, pt to continue levothyroxine 50 mcg qd   Hypertension BP Readings from Last 3 Encounters:  06/15/23 (!) 151/68  06/14/23 130/68  02/11/23 122/74   Uncontrolled, states ok at home recently, pt to continue medical treatment norvasc  2.5 every day, toprol xl 50 every day, declines other change today   Hyperglycemia Lab Results  Component Value Date   HGBA1C 5.4 06/14/2023   Stable, pt to continue current medical treatment  - diet, wt control   CKD (chronic kidney disease) stage 3, GFR 30-59 ml/min (HCC) Lab Results  Component Value Date   CREATININE  2.43 (H) 06/14/2023   Stable overall, cont to avoid nephrotoxins, for f/u bmp today  Followup: Return in about 6 months (around 12/12/2023).  Oliver Barre, MD 06/16/2023 12:53 PM [facility]

## 2023-06-15 DIAGNOSIS — E875 Hyperkalemia: Secondary | ICD-10-CM | POA: Diagnosis not present

## 2023-06-15 MED ORDER — SODIUM CHLORIDE 0.9 % IV BOLUS
500.0000 mL | Freq: Once | INTRAVENOUS | Status: AC
  Administered 2023-06-15: 500 mL via INTRAVENOUS

## 2023-06-15 NOTE — Discharge Instructions (Signed)
You were seen today for slightly elevated potassium.  It is not quite as elevated as it was in the office.  Monitor your potassium intake at home.  Avoid high potassium foods.  Make sure that you are otherwise staying hydrated.  Follow-up closely next week with your primary doctor for recheck.

## 2023-06-15 NOTE — ED Provider Notes (Signed)
[place] EMERGENCY DEPARTMENT AT [facility] Provider Note   CSN: [id number] Arrival date & time: 06/14/23  1706     History  Chief Complaint  Patient presents with   high potassium    [name] is a 89 y.o. male.  HPI     This is an 89 year old male who presents with concern for abnormal lab.  Patient reports that he went to his normal checkup yesterday morning.  He is currently asymptomatic.  He had basic labs drawn.  He received a phone call that his potassium was elevated.  He is not having any symptoms.  He does eat 2 bananas a day but does not take any supplemental potassium.  Has a history of chronic kidney disease and insufficiency.  Daughter at bedside states that he does not drink a lot of fluids.  Home Medications Prior to Admission medications   Medication Sig Start Date End Date Taking? Authorizing Provider  acetaminophen (TYLENOL) 650 MG CR tablet Take 1,300 mg by mouth 2 (two) times daily.    [provider]  amLODipine (NORVASC) 2.5 MG tablet Take 1 tablet (2.5 mg total) by mouth daily. 08/09/22   Corwin Levins, MD  CALCIUM PO Take 2 tablets by mouth daily.     [provider]  Cholecalciferol (VITAMIN D) 50 MCG (2000 UT) CAPS Take 2,000 Units by mouth daily.    [provider]  clopidogrel (PLAVIX) 75 MG tablet Take 1 tablet (75 mg total) by mouth daily. 03/05/23   Corwin Levins, MD  fish oil-omega-3 fatty acids 1000 MG capsule Take 1 g by mouth 2 (two) times daily.    [provider]  gemfibrozil (LOPID) 600 MG tablet TAKE 1 TABLET BY MOUTH DAILY. GENERIC EQUIVALENT FOR LOPID 04/11/23   Corwin Levins, MD  levothyroxine (SYNTHROID) 50 MCG tablet Take 1 tablet (50 mcg total) by mouth daily before breakfast. 03/05/23   Corwin Levins, MD  metoprolol succinate (TOPROL-XL) 50 MG 24 hr tablet Take 1 tablet (50 mg total) by mouth daily. Take with or immediately following a meal. 03/05/23   Corwin Levins, MD  Multiple  Vitamins-Minerals (MENS MULTIVITAMIN PLUS PO) Take 1 tablet by mouth daily.    [provider]  nystatin cream (MYCOSTATIN) Apply 1 Application topically daily as needed for dry skin. 12/12/20   [provider]  omeprazole (PRILOSEC) 20 MG capsule Take 1 capsule (20 mg total) by mouth daily. 08/16/22   Corwin Levins, MD  polyethylene glycol [facility] Ethelene Hal) packet Take 17 g by mouth daily as needed for mild constipation. Take 17g mixed with liquid once daily, may do a second dose as needed for constipation    [provider]  ramipril (ALTACE) 10 MG capsule Take 1 capsule (10 mg total) by mouth daily. 04/19/23   Corwin Levins, MD  simvastatin (ZOCOR) 40 MG tablet Take 1 tablet (40 mg total) by mouth at bedtime. 03/05/23   Corwin Levins, MD      Allergies    Pantoprazole and Zetia [ezetimibe]    Review of Systems   Review of Systems  Constitutional:  Negative for fever.  Respiratory:  Negative for shortness of breath.   Cardiovascular:  Negative for chest pain.  Gastrointestinal:  Negative for abdominal pain.  All other systems reviewed and are negative.   Physical Exam Updated Vital Signs BP (!) 156/63   Pulse (!) 50   Temp 97.7 F (36.5 C)  Resp 14   Ht 1.803 m (5\' 11" )   Wt 74.4 kg   SpO2 99%   BMI 22.88 kg/m  Physical Exam Vitals and nursing note reviewed.  Constitutional:      Appearance: He is well-developed. He is not ill-appearing.  HENT:     Head: Normocephalic and atraumatic.  Eyes:     Pupils: Pupils are equal, round, and reactive to light.  Cardiovascular:     Rate and Rhythm: Normal rate and regular rhythm.     Heart sounds: Normal heart sounds. No murmur heard.    Comments: Pacemaker Pulmonary:     Effort: Pulmonary effort is normal. No respiratory distress.     Breath sounds: Normal breath sounds. No wheezing.  Abdominal:     Palpations: Abdomen is soft.     Tenderness: There is no abdominal tenderness.  Musculoskeletal:      Cervical back: Neck supple.     Right lower leg: No edema.     Left lower leg: No edema.  Lymphadenopathy:     Cervical: No cervical adenopathy.  Skin:    General: Skin is warm and dry.  Neurological:     Mental Status: He is alert and oriented to person, place, and time.  Psychiatric:        Mood and Affect: Mood normal.     ED Results / Procedures / Treatments   Labs (all labs ordered are listed, but only abnormal results are displayed) Labs Reviewed  CBC - Abnormal; Notable for the following components:      Result Value   RBC 2.44 (*)    Hemoglobin 7.9 (*)    HCT 24.6 (*)    MCV 100.8 (*)    All other components within normal limits  COMPREHENSIVE METABOLIC PANEL - Abnormal; Notable for the following components:   Potassium 5.5 (*)    CO2 20 (*)    Glucose, Bld 105 (*)    BUN 29 (*)    Creatinine, Ser 2.43 (*)    Total Protein 6.4 (*)    GFR, Estimated 25 (*)    All other components within normal limits    EKG EKG Interpretation Date/Time:  Friday June 14 2023 18:42:25 EST Ventricular Rate:  52 PR Interval:  148 QRS Duration:  168 QT Interval:  448 QTC Calculation: 416 R Axis:   252  Text Interpretation: Atrial-sensed ventricular-paced rhythm Biventricular pacemaker detected Abnormal ECG When compared with ECG of 16-Oct-2022 07:47, PREVIOUS ECG IS PRESENT Confirmed by Ross Marcus ([id number]) on 06/15/2023 12:13:06 AM  Radiology No results found.  Procedures Procedures    Medications Ordered in ED Medications  sodium chloride 0.9 % bolus 500 mL (0 mLs Intravenous Stopped 06/15/23 0129)    ED Course/ Medical Decision Making/ A&P                                 Medical Decision Making  This patient presents to the ED for concern of hyperkalemia, this involves an extensive number of treatment options, and is a complaint that carries with it a high risk of complications and morbidity.  I considered the following differential and admission for this  acute, potentially life threatening condition.  The differential diagnosis includes excessive intake of potassium, reduced renal excretion, EKG changes  MDM:    This is a an 89 year old male who presents with concerns for abnormal labs.  Was noted to have a high potassium.  Also has slight AKI.  At baseline has chronic renal insufficiency.  He is currently asymptomatic.  Repeat potassium here was 5.5.  Creatinine is up slightly to 2.4.  Baseline is in the high ones.  He is also notably anemic at 7.9.  He has been similar in the past.  Likely related to chronic disease.  Given that he is currently asymptomatic, will hydrate.  EKG shows no evidence of QRS prolongation.  Potassium is likely related to worsening renal function.  Discussed with him limiting potassium in his diet and staying hydrated.  Will follow-up with primary doctor for recheck.  (Labs, imaging, consults)  Labs: I Ordered, and personally interpreted labs.  The pertinent results include: CBC, CMP  Imaging Studies ordered: I ordered imaging studies including none I independently visualized and interpreted imaging. I agree with the radiologist interpretation  Additional history obtained from chart review.  External records from outside source obtained and reviewed including outside records  Cardiac Monitoring: The patient was maintained on a cardiac monitor.  If on the cardiac monitor, I personally viewed and interpreted the cardiac monitored which showed an underlying rhythm of: Sinus rhythm  Reevaluation: After the interventions noted above, I reevaluated the patient and found that they have :stayed the same  Social Determinants of Health:  lives independently  Disposition: Discharge  Co morbidities that complicate the patient evaluation  Past Medical History:  Diagnosis Date   AICD (automatic cardioverter/defibrillator) present    "turned it off today, put in an extra lead,  and made it just a pacemaker" (08/21/2018)    Anemia, unspecified    Aortic stenosis    Mild, echo, October, 2011   Arthritis    "joints; arms"  (08/21/2018)   AV block, complete (HCC)    Permanent pacemaker   CAD (coronary artery disease)    DES to LAD 2005 /  DES to LAD 2008   Cardiomyopathy    Ischemic, EF 30%, 2005   Chronic bronchitis (HCC)    DJD (degenerative joint disease)    Dyslipidemia    Ejection fraction < 50%    EF 30%, echo, 2005  /  EF 30%, catheterization, 2008   GERD (gastroesophageal reflux disease)    Hyperlipidemia    Hypertension    Hypothyroidism    ICD (implantable cardiac defibrillator), single, in situ    for syncope and arrhythmia 2005 / generator change June, 2011   Iron deficiency anemia 06/17/2018   LBBB (left bundle branch block)    Mitral regurgitation    Mild, echo, October, 2011   Myocardial infarction [facility]) 1989   Presence of permanent cardiac pacemaker    Prostate cancer (HCC)    Rectal fissure    Syncope    2005, ICD placed   Systolic CHF, chronic (HCC)    Vitamin B12 deficiency      Medicines Meds ordered this encounter  Medications   sodium chloride 0.9 % bolus 500 mL    I have reviewed the patients home medicines and have made adjustments as needed  Problem List / ED Course: Problem List Items Addressed This Visit   None Visit Diagnoses     AKI (acute kidney injury) (HCC)    -  Primary   Hyperkalemia                       Final Clinical Impression(s) / ED Diagnoses Final diagnoses:  AKI (acute kidney injury) (HCC)  Hyperkalemia    Rx / DC  Orders ED Discharge Orders     None         Denina Rieger, Mayer Masker, MD 06/15/23 [phone]

## 2023-06-16 ENCOUNTER — Encounter: Payer: Self-pay | Admitting: Internal Medicine

## 2023-06-16 NOTE — Assessment & Plan Note (Signed)
Lab Results  Component Value Date   HGBA1C 5.4 06/14/2023   Stable, pt to continue current medical treatment  - diet, wt control

## 2023-06-16 NOTE — Assessment & Plan Note (Signed)
Lab Results  Component Value Date   TSH 3.21 06/14/2023   Stable, pt to continue levothyroxine 50 mcg qd

## 2023-06-16 NOTE — Assessment & Plan Note (Signed)
BP Readings from Last 3 Encounters:  06/15/23 (!) 151/68  06/14/23 130/68  02/11/23 122/74   Uncontrolled, states ok at home recently, pt to continue medical treatment norvasc  2.5 every day, toprol xl 50 every day, declines other change today

## 2023-06-16 NOTE — Assessment & Plan Note (Signed)
Lab Results  Component Value Date   CREATININE 2.43 (H) 06/14/2023   Stable overall, cont to avoid nephrotoxins, for f/u bmp today

## 2023-06-16 NOTE — Assessment & Plan Note (Signed)
Also for iron lab today

## 2023-06-20 ENCOUNTER — Ambulatory Visit (INDEPENDENT_AMBULATORY_CARE_PROVIDER_SITE_OTHER): Payer: Medicare Other | Admitting: Internal Medicine

## 2023-06-20 ENCOUNTER — Ambulatory Visit: Payer: Medicare Other

## 2023-06-20 ENCOUNTER — Encounter: Payer: Self-pay | Admitting: Internal Medicine

## 2023-06-20 VITALS — BP 120/68 | HR 69 | Temp 97.6°F | Ht 71.0 in | Wt 166.0 lb

## 2023-06-20 DIAGNOSIS — E538 Deficiency of other specified B group vitamins: Secondary | ICD-10-CM | POA: Diagnosis not present

## 2023-06-20 DIAGNOSIS — N184 Chronic kidney disease, stage 4 (severe): Secondary | ICD-10-CM | POA: Diagnosis not present

## 2023-06-20 DIAGNOSIS — E875 Hyperkalemia: Secondary | ICD-10-CM

## 2023-06-20 DIAGNOSIS — N179 Acute kidney failure, unspecified: Secondary | ICD-10-CM

## 2023-06-20 DIAGNOSIS — R739 Hyperglycemia, unspecified: Secondary | ICD-10-CM

## 2023-06-20 DIAGNOSIS — I10 Essential (primary) hypertension: Secondary | ICD-10-CM | POA: Diagnosis not present

## 2023-06-20 LAB — BASIC METABOLIC PANEL
BUN: 24 mg/dL — ABNORMAL HIGH (ref 6–23)
CO2: 26 meq/L (ref 19–32)
Calcium: 9.8 mg/dL (ref 8.4–10.5)
Chloride: 108 meq/L (ref 96–112)
Creatinine, Ser: 1.77 mg/dL — ABNORMAL HIGH (ref 0.40–1.50)
GFR: 33.75 mL/min — ABNORMAL LOW (ref >60.00)
Glucose, Bld: 96 mg/dL (ref 70–99)
Potassium: 5.1 meq/L (ref 3.5–5.1)
Sodium: 140 meq/L (ref 135–145)

## 2023-06-20 MED ORDER — CYANOCOBALAMIN 1000 MCG/ML IJ SOLN
1000.0000 ug | Freq: Once | INTRAMUSCULAR | Status: AC
  Administered 2023-06-20: 1000 ug via INTRAMUSCULAR

## 2023-06-20 NOTE — Progress Notes (Signed)
Patient ID: [name], male   DOB: Dec 29, 1931, 89 y.o.   MRN: [id number]        Chief Complaint: follow up AKI on CKD, hyperkalemia, hyperglycemia, low b12, htn       HPI:  [name] is a 89 y.o. male here after recent AKI on CKD likely relatd to ACEI, with hyperkalemia, asympt   Seen at ED , tx with IVF, Ace stopped.  No new complaints. Pt denies chest pain, increased sob or doe, wheezing, orthopnea, PND, increased LE swelling, palpitations, dizziness or syncope.   Pt denies polydipsia, polyuria, or new focal neuro s/s.    Pt denies fever, wt loss, night sweats, loss of appetite, or other constitutional symptoms         Wt Readings from Last 3 Encounters:  06/20/23 166 lb (75.3 kg)  06/14/23 164 lb 0.4 oz (74.4 kg)  06/14/23 164 lb (74.4 kg)   BP Readings from Last 3 Encounters:  06/20/23 120/68  06/15/23 (!) 151/68  06/14/23 130/68         Past Medical History:  Diagnosis Date   AICD (automatic cardioverter/defibrillator) present    "turned it off today, put in an extra lead,  and made it just a pacemaker" (08/21/2018)   Anemia, unspecified    Aortic stenosis    Mild, echo, October, 2011   Arthritis    "joints; arms"  (08/21/2018)   AV block, complete (HCC)    Permanent pacemaker   CAD (coronary artery disease)    DES to LAD 2005 /  DES to LAD 2008   Cardiomyopathy    Ischemic, EF 30%, 2005   Chronic bronchitis (HCC)    DJD (degenerative joint disease)    Dyslipidemia    Ejection fraction < 50%    EF 30%, echo, 2005  /  EF 30%, catheterization, 2008   GERD (gastroesophageal reflux disease)    Hyperlipidemia    Hypertension    Hypothyroidism    ICD (implantable cardiac defibrillator), single, in situ    for syncope and arrhythmia 2005 / generator change June, 2011   Iron deficiency anemia 06/17/2018   LBBB (left bundle branch block)    Mitral regurgitation    Mild, echo, October, 2011   Myocardial infarction [facility]) 1989   Presence of permanent cardiac  pacemaker    Prostate cancer [facility])    Rectal fissure    Syncope    2005, ICD placed   Systolic CHF, chronic (HCC)    Vitamin B12 deficiency    Past Surgical History:  Procedure Laterality Date   BIV UPGRADE  08/21/2018   BIV UPGRADE N/A 08/21/2018   Procedure: BIV UPGRADE;  Surgeon: Marinus Maw, MD;  [facility]: [facility] IN[facility];  Service: Cardiovascular;  Laterality: N/A;   CARDIAC CATHETERIZATION  2008   ICD IMPLANT  2005   INSERTION PROSTATE RADIATION SEED     LAPAROSCOPIC CHOLECYSTECTOMY  04/2005   Dr. Ezzard Standing   PROSTATE BIOPSY     RECTAL DILATATION     "stretched it twice" (08/21/2018)   SHOULDER OPEN ROTATOR CUFF REPAIR Right     reports that he quit smoking about 70 years ago. His smoking use included cigarettes. He started smoking about 72 years ago. He has a 2 pack-year smoking history. He has never used smokeless tobacco. He reports that he does not drink alcohol and does not use drugs. family history includes Breast cancer in his maternal aunt; Cancer (age of onset: 64) in  his mother; Heart Problems in his brother and father; Heart attack in his brother; Heart attack (age of onset: 73) in his father; Lung cancer in his brother and sister; Other in his sister; Stroke in his brother and mother. Allergies  Allergen Reactions   Pantoprazole Other (See Comments)    B12 levels dropped on medication   Zetia [Ezetimibe] Other (See Comments)    Arm weakness   Current Outpatient Medications on File Prior to Visit  Medication Sig Dispense Refill   acetaminophen (TYLENOL) 650 MG CR tablet Take 1,300 mg by mouth 2 (two) times daily.     amLODipine (NORVASC) 2.5 MG tablet Take 1 tablet (2.5 mg total) by mouth daily. 90 tablet 3   CALCIUM PO Take 2 tablets by mouth daily.      Cholecalciferol (VITAMIN D) 50 MCG (2000 UT) CAPS Take 2,000 Units by mouth daily.     clopidogrel (PLAVIX) 75 MG tablet Take 1 tablet (75 mg total) by mouth daily. 90 tablet 3   fish oil-omega-3 fatty acids  1000 MG capsule Take 1 g by mouth 2 (two) times daily.     gemfibrozil (LOPID) 600 MG tablet TAKE 1 TABLET BY MOUTH DAILY. GENERIC EQUIVALENT FOR LOPID 90 tablet 3   levothyroxine (SYNTHROID) 50 MCG tablet Take 1 tablet (50 mcg total) by mouth daily before breakfast. 90 tablet 3   metoprolol succinate (TOPROL-XL) 50 MG 24 hr tablet Take 1 tablet (50 mg total) by mouth daily. Take with or immediately following a meal. 90 tablet 3   Multiple Vitamins-Minerals (MENS MULTIVITAMIN PLUS PO) Take 1 tablet by mouth daily.     nystatin cream (MYCOSTATIN) Apply 1 Application topically daily as needed for dry skin.     omeprazole (PRILOSEC) 20 MG capsule Take 1 capsule (20 mg total) by mouth daily. 90 capsule 3   polyethylene glycol (MIRALAX / GLYCOLAX) packet Take 17 g by mouth daily as needed for mild constipation. Take 17g mixed with liquid once daily, may do a second dose as needed for constipation     simvastatin (ZOCOR) 40 MG tablet Take 1 tablet (40 mg total) by mouth at bedtime. 90 tablet 3   ramipril (ALTACE) 10 MG capsule Take 1 capsule (10 mg total) by mouth daily. (Patient not taking: Reported on 06/20/2023) 90 capsule 3   No current facility-administered medications on file prior to visit.        ROS:  All others reviewed and negative.  Objective        PE:  BP 120/68 (BP Location: Left Arm, Patient Position: Sitting, Cuff Size: Normal)   Pulse 69   Temp 97.6 F (36.4 C) (Oral)   Ht 5\' 11"  (1.803 m)   Wt 166 lb (75.3 kg)   SpO2 99%   BMI 23.15 kg/m                 Constitutional: Pt appears in NAD               HENT: Head: NCAT.                Right Ear: External ear normal.                 Left Ear: External ear normal.                Eyes: . Pupils are equal, round, and reactive to light. Conjunctivae and EOM are normal  Nose: without d/c or deformity               Neck: Neck supple. Gross normal ROM               Cardiovascular: Normal rate and regular rhythm.                  Pulmonary/Chest: Effort normal and breath sounds without rales or wheezing.                Abd:  Soft, NT, ND, + BS, no organomegaly               Neurological: Pt is alert. At baseline orientation, motor grossly intact               Skin: Skin is warm. No rashes, no other new lesions, LE edema - none               Psychiatric: Pt behavior is normal without agitation   Micro: none  Cardiac tracings I have personally interpreted today:  none  Pertinent Radiological findings (summarize): none   Lab Results  Component Value Date   WBC 4.2 06/14/2023   HGB 7.9 (L) 06/14/2023   HCT 24.6 (L) 06/14/2023   PLT 186 06/14/2023   GLUCOSE 96 06/20/2023   CHOL 161 06/14/2023   TRIG 163.0 (H) 06/14/2023   HDL 40.30 06/14/2023   LDLDIRECT 95.0 08/08/2022   LDLCALC 88 06/14/2023   ALT 12 06/14/2023   AST 24 06/14/2023   NA 140 06/20/2023   K 5.1 06/20/2023   CL 108 06/20/2023   CREATININE 1.77 (H) 06/20/2023   BUN 24 (H) 06/20/2023   CO2 26 06/20/2023   TSH 3.21 06/14/2023   PSA 0.00 (L) 01/24/2021   INR 0.95 01/06/2010   HGBA1C 5.4 06/14/2023   Assessment/Plan:  [name] is a 89 y.o. White or Caucasian [1] male with  has a past medical history of AICD (automatic cardioverter/defibrillator) present, Anemia, unspecified, Aortic stenosis, Arthritis, AV block, complete (HCC), CAD (coronary artery disease), Cardiomyopathy, Chronic bronchitis (HCC), DJD (degenerative joint disease), Dyslipidemia, Ejection fraction < 50%, GERD (gastroesophageal reflux disease), Hyperlipidemia, Hypertension, Hypothyroidism, ICD (implantable cardiac defibrillator), single, in situ, Iron deficiency anemia (06/17/2018), LBBB (left bundle branch block), Mitral regurgitation, Myocardial infarction (HCC) (1989), Presence of permanent cardiac pacemaker, Prostate cancer (HCC), Rectal fissure, Syncope, Systolic CHF, chronic (HCC), and Vitamin B12 deficiency.  Vitamin B 12 deficiency Lab Results   Component Value Date   VITAMINB12 >1500 (H) 08/08/2022   Stable, cont oral replacement - b12 1000 mcg qd   Hyperkalemia Asympt, ACEI stopped, now for f/u lab  Hyperglycemia Lab Results  Component Value Date   HGBA1C 5.4 06/14/2023   Stable, pt to continue current medical treatment  - diet, wt control   Acute on chronic kidney failure (HCC) Sen at ED, s/p IVF, ACEI stopped, now for f/u lab  Hypertension BP Readings from Last 3 Encounters:  06/20/23 120/68  06/15/23 (!) 151/68  06/14/23 130/68   Stable, pt to continue medical treatment norvasc 2.5 every day, and to stop the ACEI  Followup: Return in about 6 months (around 12/18/2023).  Oliver Barre, MD 06/22/2023 7:43 PM [facility] - [facility] Internal Medicine

## 2023-06-20 NOTE — Patient Instructions (Signed)
Ok to stay off the ramipril for now  Please continue all other medications as before, and refills have been done if requested.  Please have the pharmacy call with any other refills you may need.  Please continue your efforts at being more active, low cholesterol diet, and weight control.  Please keep your appointments with your specialists as you may have planned - cardiology Dec 20  Please go to the LAB at the blood drawing area for the tests to be done  You will be contacted by phone if any changes need to be made immediately.  Otherwise, you will receive a letter about your results with an explanation, but please check with MyChart first.  Please make an Appointment to return in 6 months, or sooner if needed

## 2023-06-22 ENCOUNTER — Encounter: Payer: Self-pay | Admitting: Internal Medicine

## 2023-06-22 NOTE — Assessment & Plan Note (Signed)
Sen at ED, s/p IVF, ACEI stopped, now for f/u lab

## 2023-06-22 NOTE — Assessment & Plan Note (Signed)
BP Readings from Last 3 Encounters:  06/20/23 120/68  06/15/23 (!) 151/68  06/14/23 130/68   Stable, pt to continue medical treatment norvasc 2.5 every day, and to stop the ACEI

## 2023-06-22 NOTE — Assessment & Plan Note (Signed)
Asympt, ACEI stopped, now for f/u lab

## 2023-06-22 NOTE — Assessment & Plan Note (Signed)
 Lab Results  Component Value Date   HGBA1C 5.4 06/14/2023   Stable, pt to continue current medical treatment  - diet, wt control

## 2023-06-22 NOTE — Assessment & Plan Note (Signed)
Lab Results  Component Value Date   VITAMINB12 >1500 (H) 08/08/2022   Stable, cont oral replacement - b12 1000 mcg qd

## 2023-07-11 ENCOUNTER — Ambulatory Visit (INDEPENDENT_AMBULATORY_CARE_PROVIDER_SITE_OTHER): Payer: Medicare Other

## 2023-07-11 DIAGNOSIS — I442 Atrioventricular block, complete: Secondary | ICD-10-CM | POA: Diagnosis not present

## 2023-07-11 LAB — CUP PACEART REMOTE DEVICE CHECK
Battery Remaining Longevity: 26 mo
Battery Remaining Percentage: 32 %
Battery Voltage: 2.93 V
Brady Statistic AP VP Percent: 90 %
Brady Statistic AP VS Percent: 1 %
Brady Statistic AS VP Percent: 10 %
Brady Statistic AS VS Percent: 1 %
Brady Statistic RA Percent Paced: 89 %
Date Time Interrogation Session: 20241205020008
Implantable Lead Connection Status: 753985
Implantable Lead Connection Status: 753985
Implantable Lead Connection Status: 753985
Implantable Lead Implant Date: 20050509
Implantable Lead Implant Date: 20050509
Implantable Lead Implant Date: 20200116
Implantable Lead Location: 753858
Implantable Lead Location: 753859
Implantable Lead Location: 753860
Implantable Lead Model: 1580
Implantable Pulse Generator Implant Date: 20200116
Lead Channel Impedance Value: 280 Ohm
Lead Channel Impedance Value: 360 Ohm
Lead Channel Impedance Value: 700 Ohm
Lead Channel Pacing Threshold Amplitude: 0.75 V
Lead Channel Pacing Threshold Amplitude: 1 V
Lead Channel Pacing Threshold Amplitude: 1 V
Lead Channel Pacing Threshold Pulse Width: 0.5 ms
Lead Channel Pacing Threshold Pulse Width: 0.5 ms
Lead Channel Pacing Threshold Pulse Width: 1 ms
Lead Channel Sensing Intrinsic Amplitude: 1.1 mV
Lead Channel Sensing Intrinsic Amplitude: 5.1 mV
Lead Channel Setting Pacing Amplitude: 1.75 V
Lead Channel Setting Pacing Amplitude: 2 V
Lead Channel Setting Pacing Amplitude: 2.5 V
Lead Channel Setting Pacing Pulse Width: 0.5 ms
Lead Channel Setting Pacing Pulse Width: 1 ms
Lead Channel Setting Sensing Sensitivity: 4 mV
Pulse Gen Model: 3562
Pulse Gen Serial Number: 9097773

## 2023-07-22 ENCOUNTER — Ambulatory Visit (INDEPENDENT_AMBULATORY_CARE_PROVIDER_SITE_OTHER): Payer: Medicare Other

## 2023-07-22 DIAGNOSIS — E538 Deficiency of other specified B group vitamins: Secondary | ICD-10-CM

## 2023-07-22 MED ORDER — CYANOCOBALAMIN 1000 MCG/ML IJ SOLN
1000.0000 ug | Freq: Once | INTRAMUSCULAR | Status: AC
  Administered 2023-07-22: 1000 ug via INTRAMUSCULAR

## 2023-07-22 NOTE — Progress Notes (Signed)
After obtaining consent, and per orders of Dr. John, injection of B12 given by Haidynn Almendarez P Kasim Mccorkle. Patient instructed to report any adverse reaction to me immediately.  

## 2023-07-26 ENCOUNTER — Ambulatory Visit: Payer: Medicare Other | Attending: Cardiovascular Disease | Admitting: Cardiovascular Disease

## 2023-07-26 ENCOUNTER — Encounter: Payer: Self-pay | Admitting: Cardiovascular Disease

## 2023-07-26 VITALS — BP 146/70 | HR 52 | Ht 71.0 in | Wt 166.4 lb

## 2023-07-26 DIAGNOSIS — I255 Ischemic cardiomyopathy: Secondary | ICD-10-CM

## 2023-07-26 DIAGNOSIS — I25118 Atherosclerotic heart disease of native coronary artery with other forms of angina pectoris: Secondary | ICD-10-CM | POA: Diagnosis not present

## 2023-07-26 DIAGNOSIS — I5022 Chronic systolic (congestive) heart failure: Secondary | ICD-10-CM | POA: Diagnosis not present

## 2023-07-26 DIAGNOSIS — I442 Atrioventricular block, complete: Secondary | ICD-10-CM | POA: Diagnosis not present

## 2023-07-26 DIAGNOSIS — I359 Nonrheumatic aortic valve disorder, unspecified: Secondary | ICD-10-CM

## 2023-07-26 DIAGNOSIS — I10 Essential (primary) hypertension: Secondary | ICD-10-CM

## 2023-07-26 DIAGNOSIS — E7800 Pure hypercholesterolemia, unspecified: Secondary | ICD-10-CM

## 2023-07-26 NOTE — Progress Notes (Signed)
Chief Complaint  Patient presents with   Follow-up    CAD   History of Present Illness: 89 yo male with history of complete heart block, CAD s/p stenting of the LAD in 2005 and 2008, ischemic cardiomyopathy s/p BiV ICD (implanted 2005/generator change 2011 and upgrade to BiV ICD in 2020), HLD, HTN and chronic systolic CHF here today for cardiac follow up. His ICD is followed by Dr. Ladona Ridgel. Echo July 2016 with LVEF=40-45%, mild AI, trivial MR. Diagnosis of prostate cancer in 2014. He was seen in October 2019 and he c/o jaw pain and tooth pain with dyspnea with moderate exertion. Nuclear stress test November  2019 with medium sized inferior, inferoseptal and apical defect with scar and possible soft tissue attenuation but no ischemia. Echo September 2020 with LVEF=50-55%, mild MR.   He is here today for follow up. The patient denies any chest pain, dyspnea, palpitations, lower extremity edema, orthopnea, PND, dizziness, near syncope or syncope.    Primary Care Physician: Corwin Levins, MD  Past Medical History:  Diagnosis Date   AICD (automatic cardioverter/defibrillator) present    "turned it off today, put in an extra lead,  and made it just a pacemaker" (08/21/2018)   Anemia, unspecified    Aortic stenosis    Mild, echo, October, 2011   Arthritis    "joints; arms"  (08/21/2018)   AV block, complete (HCC)    Permanent pacemaker   CAD (coronary artery disease)    DES to LAD 2005 /  DES to LAD 2008   Cardiomyopathy    Ischemic, EF 30%, 2005   Chronic bronchitis (HCC)    DJD (degenerative joint disease)    Dyslipidemia    Ejection fraction < 50%    EF 30%, echo, 2005  /  EF 30%, catheterization, 2008   GERD (gastroesophageal reflux disease)    Hyperlipidemia    Hypertension    Hypothyroidism    ICD (implantable cardiac defibrillator), single, in situ    for syncope and arrhythmia 2005 / generator change June, 2011   Iron deficiency anemia 06/17/2018   LBBB (left bundle branch  block)    Mitral regurgitation    Mild, echo, October, 2011   Myocardial infarction [facility]) 1989   Presence of permanent cardiac pacemaker    Prostate cancer [facility])    Rectal fissure    Syncope    2005, ICD placed   Systolic CHF, chronic (HCC)    Vitamin B12 deficiency     Past Surgical History:  Procedure Laterality Date   BIV UPGRADE  08/21/2018   BIV UPGRADE N/A 08/21/2018   Procedure: BIV UPGRADE;  Surgeon: Marinus Maw, MD;  [facility];  Service: Cardiovascular;  Laterality: N/A;   CARDIAC CATHETERIZATION  2008   ICD IMPLANT  2005   INSERTION PROSTATE RADIATION SEED     LAPAROSCOPIC CHOLECYSTECTOMY  04/2005   Dr. Ezzard Standing   PROSTATE BIOPSY     RECTAL DILATATION     "stretched it twice" (08/21/2018)   SHOULDER OPEN ROTATOR CUFF REPAIR Right     Current Outpatient Medications  Medication Sig Dispense Refill   acetaminophen (TYLENOL) 650 MG CR tablet Take 1,300 mg by mouth 2 (two) times daily.     amLODipine (NORVASC) 2.5 MG tablet Take 1 tablet (2.5 mg total) by mouth daily. 90 tablet 3   CALCIUM PO Take 2 tablets by mouth daily.      Cholecalciferol (VITAMIN D) 50 MCG (2000 UT) CAPS  Take 2,000 Units by mouth daily.     clopidogrel (PLAVIX) 75 MG tablet Take 1 tablet (75 mg total) by mouth daily. 90 tablet 3   fish oil-omega-3 fatty acids 1000 MG capsule Take 1 g by mouth 2 (two) times daily.     gemfibrozil (LOPID) 600 MG tablet TAKE 1 TABLET BY MOUTH DAILY. GENERIC EQUIVALENT FOR LOPID 90 tablet 3   levothyroxine (SYNTHROID) 50 MCG tablet Take 1 tablet (50 mcg total) by mouth daily before breakfast. 90 tablet 3   metoprolol succinate (TOPROL-XL) 50 MG 24 hr tablet Take 1 tablet (50 mg total) by mouth daily. Take with or immediately following a meal. 90 tablet 3   Multiple Vitamins-Minerals (MENS MULTIVITAMIN PLUS PO) Take 1 tablet by mouth daily.     nystatin cream (MYCOSTATIN) Apply 1 Application topically daily as needed for dry skin.     omeprazole  (PRILOSEC) 20 MG capsule Take 1 capsule (20 mg total) by mouth daily. 90 capsule 3   polyethylene glycol (MIRALAX / GLYCOLAX) packet Take 17 g by mouth daily as needed for mild constipation. Take 17g mixed with liquid once daily, may do a second dose as needed for constipation     simvastatin (ZOCOR) 40 MG tablet Take 1 tablet (40 mg total) by mouth at bedtime. 90 tablet 3   No current facility-administered medications for this visit.    Allergies  Allergen Reactions   Pantoprazole Other (See Comments)    B12 levels dropped on medication   Zetia [Ezetimibe] Other (See Comments)    Arm weakness    Social History   Socioeconomic History   Marital status: Widowed    Spouse name: Not on file   Number of children: 2   Years of education: Not on file   Highest education level: Not on file  Occupational History   Occupation: repairs looms at [employer]   Occupation: retired  Tobacco Use   Smoking status: Former    Current packs/day: 0.00    Average packs/day: 1 pack/day for 2.0 years (2.0 ttl pk-yrs)    Types: Cigarettes    Start date: 08/06/1950    Quit date: 08/06/1952    Years since quitting: 71.0   Smokeless tobacco: Never  Vaping Use   Vaping status: Never Used  Substance and Sexual Activity   Alcohol use: Never   Drug use: Never   Sexual activity: Not Currently  Other Topics Concern   Not on file  Social History Narrative   Not on file   Social Drivers of Health   Financial Resource Strain: Not on file  Food Insecurity: No Food Insecurity (10/16/2022)   Hunger Vital Sign    Worried About Running Out of Food in the Last Year: Never true    Ran Out of Food in the Last Year: Never true  Transportation Needs: No Transportation Needs (10/16/2022)   PRAPARE - Administrator, Civil Service (Medical): No    Lack of Transportation (Non-Medical): No  Physical Activity: Not on file  Stress: Not on file  Social Connections: Not on file  Intimate Partner Violence:  Not At Risk (10/16/2022)   Humiliation, Afraid, Rape, and Kick questionnaire    Fear of Current or Ex-Partner: No    Emotionally Abused: No    Physically Abused: No    Sexually Abused: No    Family History  Problem Relation Age of Onset   Stroke Mother    Cancer Mother 73  unknown   Heart attack Father 13   Heart Problems Father    Lung cancer Sister    Other Sister        PACEMAKER   Heart attack Brother    Lung cancer Brother    Stroke Brother    Heart Problems Brother        CABG   Breast cancer Maternal Aunt    Colon cancer Neg Hx    Esophageal cancer Neg Hx    Stomach cancer Neg Hx     Review of Systems:  As stated in the HPI and otherwise negative.   BP (!) 146/70 (BP Location: Right Arm, Patient Position: Sitting)   Pulse (!) 52   Ht 5\' 11"  (1.803 m)   Wt 75.5 kg   SpO2 98%   BMI 23.21 kg/m   Physical Examination:  General: Well developed, well nourished, NAD  HEENT: OP clear, mucus membranes moist  SKIN: warm, dry. No rashes. Neuro: No focal deficits  Musculoskeletal: Muscle strength 5/5 all ext  Psychiatric: Mood and affect normal  Neck: No JVD, no carotid bruits, no thyromegaly, no lymphadenopathy.  Lungs:Clear bilaterally, no wheezes, rhonci, crackles Cardiovascular: Regular rate and rhythm. No murmurs, gallops or rubs. Abdomen:Soft. Bowel sounds present. Non-tender.  Extremities: No lower extremity edema. Pulses are 2 + in the bilateral DP/PT.  Echo September 2020:  1. Left ventricular ejection fraction, by visual estimation, is 50 to  55%. The left ventricle has normal function. There is no left ventricular  hypertrophy.   2. Left ventricular diastolic Doppler parameters are indeterminate  pattern of LV diastolic filling.   3. Global longitudinal strain= =17%.   4. Global right ventricle has normal systolic function.The right  ventricular size is normal. No increase in right ventricular wall  thickness.   5. Mild mitral valve  regurgitation.   6. The tricuspid valve is normal in structure. Tricuspid valve  regurgitation is trivial.   7. The aortic valve is tricuspid Aortic valve regurgitation is trivial by  color flow Doppler. Mild to moderate aortic valve sclerosis/calcification  without any evidence of aortic stenosis.   EKG:  EKG is not ordered today. The ekg ordered today demonstrates   Recent Labs: 10/16/2022: B Natriuretic Peptide 404.2; Magnesium 2.1 06/14/2023: ALT 12; Hemoglobin 7.9; Platelets 186; TSH 3.21 06/20/2023: BUN 24; Creatinine, Ser 1.77; Potassium 5.1; Sodium 140   Lipid Panel    Component Value Date/Time   CHOL 161 06/14/2023 0911   CHOL 144 10/13/2021 0851   TRIG 163.0 (H) 06/14/2023 0911   HDL 40.30 06/14/2023 0911   HDL 45 10/13/2021 0851   CHOLHDL 4 06/14/2023 0911   VLDL 32.6 06/14/2023 0911   LDLCALC 88 06/14/2023 0911   LDLCALC 77 10/13/2021 0851   LDLDIRECT 95.0 08/08/2022 0930     Wt Readings from Last 3 Encounters:  07/26/23 75.5 kg  06/20/23 75.3 kg  06/14/23 74.4 kg    Assessment and Plan:   1. CAD with angina: No chest pain. Nuclear stress test in 2019 with no ischemia. Continue statin, beta blocker and Plavix.   2. Ischemic cardiomyopathy/Chronic systolic CHF: Echo September 2020 with LVEF=50-55%. BiV ICD is in place. Weight is stable. Continue Toprol. Ramipril stopped due to elevated potassium in November 2024.   3. HTN: BP is slightly elevated since stopping his ramipril. Will follow at home and if elevated, will let Dr. Jonny Ruiz know this. He could increase his Norvasc to 5 mg daily if needed. No changes  4.  HLD: His lipids are followed in primary care. LDL goal under 55. Last LDL 88 in November 2024. He is on Zocor 40 mg daily. He does not wish to change statins or increase the Zocor. We tried Zetia but he did not tolerate this due to muscle aches.   5. Complete heart block: BiV ICD in place. Followed in EP/Pacer clinic.   6. Aortic valve disease: Trivial AI  by echo September 2020. He has a systolic murmur on exam. Will repeat echo now.   Labs/ tests ordered today include:   Orders Placed This Encounter  Procedures   ECHOCARDIOGRAM COMPLETE   Disposition:   F/U with me in 12 months  Signed, Verne Carrow, MD 07/26/2023 3:21 PM    [facility] [address] [place], [place], [place]  [address] Phone: [phone]; Fax: [phone]

## 2023-07-26 NOTE — Patient Instructions (Signed)
 Medication Instructions:  No changes *If you need a refill on your cardiac medications before your next appointment, please call your pharmacy*   Lab Work: none If you have labs (blood work) drawn today and your tests are completely normal, you will receive your results only by: MyChart Message (if you have MyChart) OR A paper copy in the mail If you have any lab test that is abnormal or we need to change your treatment, we will call you to review the results.   Testing/Procedures: Your physician has requested that you have an echocardiogram. Echocardiography is a painless test that uses sound waves to create images of your heart. It provides your doctor with information about the size and shape of your heart and how well your heart's chambers and valves are working. This procedure takes approximately one hour. There are no restrictions for this procedure. Please do NOT wear cologne, perfume, aftershave, or lotions (deodorant is allowed). Please arrive 15 minutes prior to your appointment time.  Please note: We ask at that you not bring children with you during ultrasound (echo/ vascular) testing. Due to room size and safety concerns, children are not allowed in the ultrasound rooms during exams. Our front office staff cannot provide observation of children in our lobby area while testing is being conducted. An adult accompanying a patient to their appointment will only be allowed in the ultrasound room at the discretion of the ultrasound technician under special circumstances. We apologize for any inconvenience.    Follow-Up: At [facility], you and your health needs are our priority.  As part of our continuing mission to provide you with exceptional heart care, we have created designated Provider Care Teams.  These Care Teams include your primary Cardiologist (physician) and Advanced Practice Providers (APPs -  Physician Assistants and Nurse Practitioners) who all work together to  provide you with the care you need, when you need it.   Your next appointment:   12 month(s)  Provider:   Verne Carrow, MD

## 2023-08-12 ENCOUNTER — Other Ambulatory Visit: Payer: Self-pay | Admitting: Internal Medicine

## 2023-08-13 ENCOUNTER — Other Ambulatory Visit: Payer: Self-pay

## 2023-08-21 ENCOUNTER — Other Ambulatory Visit: Payer: Self-pay | Admitting: Internal Medicine

## 2023-08-21 MED ORDER — AMLODIPINE BESYLATE 2.5 MG PO TABS: 2.5000 mg | ORAL_TABLET | Freq: Every day | ORAL | 3 refills | Status: DC

## 2023-08-21 NOTE — Telephone Encounter (Signed)
 Copied from CRM [id number]. Topic: Clinical - Medication Refill >> Aug 21, 2023 11:09 AM [name] C wrote: Most Recent Primary Care Visit:  Provider: Arch Beans, GRACE P  Department: [facility]  Visit Type: NURSE VISIT  Date: 07/22/2023  Medication: amLODipine  (NORVASC ) 2.5 MG tablet   Has the patient contacted their pharmacy? Yes,the pharmacy did not get a response from the provider. The refill on 08/13/2023 was [facility]. (Agent: If no, request that the patient contact the pharmacy for the refill. If patient does not wish to contact the pharmacy document the reason why and proceed with request.) (Agent: If yes, when and what did the pharmacy advise?)  Is this the correct pharmacy for this prescription? Yes If no, delete pharmacy and type the correct one.  This is the patient's preferred pharmacy:   [id number]- [address] [place], [place], [place] [address] [phone] [phone]) [phone]   Has the prescription been filled recently? Yes  Is the patient out of the medication? Yes  Has the patient been seen for an appointment in the last year OR does the patient have an upcoming appointment? Yes  Can we respond through MyChart? No  Agent: Please be advised that Rx refills may take up to 3 business days. We ask that you follow-up with your pharmacy.

## 2023-08-22 ENCOUNTER — Ambulatory Visit: Payer: Medicare Other

## 2023-08-22 DIAGNOSIS — E538 Deficiency of other specified B group vitamins: Secondary | ICD-10-CM | POA: Diagnosis not present

## 2023-08-22 MED ORDER — CYANOCOBALAMIN 1000 MCG/ML IJ SOLN
1000.0000 ug | Freq: Once | INTRAMUSCULAR | Status: AC
  Administered 2023-08-22: 1000 ug via INTRAMUSCULAR

## 2023-08-22 NOTE — Progress Notes (Signed)
Patient visits today for their b-12 injection. Patient informed of what they were receiving and tolerated injection well. Patient notified to reach out to office if needed.

## 2023-08-28 ENCOUNTER — Ambulatory Visit (HOSPITAL_COMMUNITY): Payer: Medicare Other | Attending: Cardiovascular Disease

## 2023-08-28 DIAGNOSIS — I359 Nonrheumatic aortic valve disorder, unspecified: Secondary | ICD-10-CM | POA: Diagnosis not present

## 2023-08-28 LAB — ECHOCARDIOGRAM COMPLETE
AR max vel: 1.32 cm2
AV Area VTI: 1.36 cm2
AV Area mean vel: 1.2 cm2
AV Mean grad: 11 mm[Hg]
AV Peak grad: 19.2 mm[Hg]
AV Vena cont: 0.23 cm
Ao pk vel: 2.19 m/s
Area-P 1/2: 3.06 cm2
MV M vel: 4.86 m/s
MV Peak grad: 94.5 mm[Hg]
Radius: 0.4 cm
S' Lateral: 3.69 cm

## 2023-08-31 ENCOUNTER — Other Ambulatory Visit: Payer: Self-pay | Admitting: Internal Medicine

## 2023-08-31 DIAGNOSIS — I2510 Atherosclerotic heart disease of native coronary artery without angina pectoris: Secondary | ICD-10-CM

## 2023-09-02 ENCOUNTER — Other Ambulatory Visit: Payer: Self-pay

## 2023-09-03 ENCOUNTER — Telehealth: Payer: Self-pay | Admitting: Cardiovascular Disease

## 2023-09-03 DIAGNOSIS — N184 Chronic kidney disease, stage 4 (severe): Secondary | ICD-10-CM | POA: Diagnosis not present

## 2023-09-03 DIAGNOSIS — N2581 Secondary hyperparathyroidism of renal origin: Secondary | ICD-10-CM | POA: Diagnosis not present

## 2023-09-03 DIAGNOSIS — N189 Chronic kidney disease, unspecified: Secondary | ICD-10-CM | POA: Diagnosis not present

## 2023-09-03 DIAGNOSIS — D631 Anemia in chronic kidney disease: Secondary | ICD-10-CM | POA: Diagnosis not present

## 2023-09-03 DIAGNOSIS — I129 Hypertensive chronic kidney disease with stage 1 through stage 4 chronic kidney disease, or unspecified chronic kidney disease: Secondary | ICD-10-CM | POA: Diagnosis not present

## 2023-09-03 DIAGNOSIS — N1832 Chronic kidney disease, stage 3b: Secondary | ICD-10-CM | POA: Diagnosis not present

## 2023-09-03 NOTE — Telephone Encounter (Signed)
Patient's daughter is returning RN's call for the patient's echo results.   She is taking the patient to an appt with [facility] later this afternoon, so she is requesting the callback before 3:30 pm or after 4:30 pm today.   Please advise.

## 2023-09-03 NOTE — Telephone Encounter (Signed)
Kyle Hazel, MD 08/29/2023  8:15 AM EST     Low normal LV systolic function, unchanged from prior echo. Mild leakiness of the mitral valve. He has mild leakiness of his aortic valve which is unchanged. Now with mild aortic stenosis. I would repeat his echo in 2 years. No changes at this time. [name]     The patients daughter Corene Cornea (on [place])  has been notified of the result and verbalized understanding.  All questions (if any) were answered. Loa Socks, LPN 0/27/2536 64:40 PM

## 2023-09-04 ENCOUNTER — Other Ambulatory Visit: Payer: Self-pay | Admitting: Nephrology

## 2023-09-04 ENCOUNTER — Encounter: Payer: Self-pay | Admitting: Nephrology

## 2023-09-04 DIAGNOSIS — N1832 Chronic kidney disease, stage 3b: Secondary | ICD-10-CM

## 2023-09-04 LAB — LAB REPORT - SCANNED
Creatinine, POC: 82.6 mg/dL
EGFR: 40

## 2023-09-05 ENCOUNTER — Ambulatory Visit
Admission: RE | Admit: 2023-09-05 | Discharge: 2023-09-05 | Disposition: A | Discharge status: Home / Self Care | Payer: Medicare Other | Source: Ambulatory Visit | Attending: Nephrology | Admitting: Nephrology

## 2023-09-05 DIAGNOSIS — N1832 Chronic kidney disease, stage 3b: Secondary | ICD-10-CM | POA: Diagnosis not present

## 2023-09-13 ENCOUNTER — Telehealth: Payer: Self-pay | Admitting: Internal Medicine

## 2023-09-13 NOTE — Telephone Encounter (Signed)
 Copied from CRM [id number]. Topic: Clinical - Medication Refill >> Sep 13, 2023  8:44 AM Deidre DASEN wrote: Most Recent Primary Care Visit:  Provider: LUCETTA CLEATRICE ORN  Department: [facility]  Visit Type: NURSE VISIT  Date: 08/22/2023  Medication: omeprazole  (PRILOSEC) 20 MG capsule [[id number]]  Has the patient contacted their pharmacy? Yes (Agent: If no, request that the patient contact the pharmacy for the refill. If patient does not wish to contact the pharmacy document the reason why and proceed with request.) (Agent: If yes, when and what did the pharmacy advise?)  Is this the correct pharmacy for this prescription? Yes If no, delete pharmacy and type the correct one.  This is the patient's preferred pharmacy:  [facility] - [place], [place] - [address] [place] [address] Phone: [phone] Fax: [phone]    Has the prescription been filled recently? No  Is the patient out of the medication? Yes  Has the patient been seen for an appointment in the last year OR does the patient have an upcoming appointment? Yes  Can we respond through MyChart? No  Agent: Please be advised that Rx refills may take up to 3 business days. We ask that you follow-up with your pharmacy.

## 2023-09-13 NOTE — Telephone Encounter (Signed)
 This medication was sent in on 09/02/23.

## 2023-09-18 ENCOUNTER — Other Ambulatory Visit: Payer: Self-pay

## 2023-09-18 DIAGNOSIS — I2510 Atherosclerotic heart disease of native coronary artery without angina pectoris: Secondary | ICD-10-CM

## 2023-09-18 MED ORDER — OMEPRAZOLE 20 MG PO CPDR: 20.0000 mg | DELAYED_RELEASE_CAPSULE | Freq: Every day | ORAL | 3 refills | Status: DC

## 2023-09-18 NOTE — Telephone Encounter (Signed)
Copied from CRM [id number]. Topic: Clinical - Prescription Issue >> Sep 18, 2023  3:52 PM Eunice Blase wrote: Reason for CRM: Pt's daughter called would like to know why omeprazole (PRILOSEC) 20 MG capsule [[id number]] has not been approved or something else has not been sent in for pt. Please call [phone].

## 2023-09-18 NOTE — Telephone Encounter (Signed)
Refill has been sent in once again.

## 2023-09-23 ENCOUNTER — Ambulatory Visit (INDEPENDENT_AMBULATORY_CARE_PROVIDER_SITE_OTHER): Payer: Medicare Other

## 2023-09-23 DIAGNOSIS — E538 Deficiency of other specified B group vitamins: Secondary | ICD-10-CM | POA: Diagnosis not present

## 2023-09-23 MED ORDER — CYANOCOBALAMIN 1000 MCG/ML IJ SOLN
1000.0000 ug | Freq: Once | INTRAMUSCULAR | Status: AC
  Administered 2023-09-23: 1000 ug via INTRAMUSCULAR

## 2023-09-23 NOTE — Progress Notes (Signed)
 After obtaining consent, and per orders of Dr. Jonny Ruiz, injection of B12 given by Ferdie Ping. Patient instructed to report any adverse reaction to me immediately.

## 2023-10-07 DIAGNOSIS — N1832 Chronic kidney disease, stage 3b: Secondary | ICD-10-CM | POA: Diagnosis not present

## 2023-10-10 ENCOUNTER — Ambulatory Visit: Payer: Medicare Other

## 2023-10-10 DIAGNOSIS — I255 Ischemic cardiomyopathy: Secondary | ICD-10-CM | POA: Diagnosis not present

## 2023-10-13 LAB — CUP PACEART REMOTE DEVICE CHECK
Battery Remaining Longevity: 24 mo
Battery Remaining Percentage: 28 %
Battery Voltage: 2.93 V
Brady Statistic AP VP Percent: 90 %
Brady Statistic AP VS Percent: 1 %
Brady Statistic AS VP Percent: 10 %
Brady Statistic AS VS Percent: 1 %
Brady Statistic RA Percent Paced: 89 %
Date Time Interrogation Session: 20250306020014
Implantable Lead Connection Status: 753985
Implantable Lead Connection Status: 753985
Implantable Lead Connection Status: 753985
Implantable Lead Implant Date: 20050509
Implantable Lead Implant Date: 20050509
Implantable Lead Implant Date: 20200116
Implantable Lead Location: 753858
Implantable Lead Location: 753859
Implantable Lead Location: 753860
Implantable Lead Model: 1580
Implantable Pulse Generator Implant Date: 20200116
Lead Channel Impedance Value: 280 Ohm
Lead Channel Impedance Value: 390 Ohm
Lead Channel Impedance Value: 790 Ohm
Lead Channel Pacing Threshold Amplitude: 0.75 V
Lead Channel Pacing Threshold Amplitude: 1 V
Lead Channel Pacing Threshold Amplitude: 1 V
Lead Channel Pacing Threshold Pulse Width: 0.5 ms
Lead Channel Pacing Threshold Pulse Width: 0.5 ms
Lead Channel Pacing Threshold Pulse Width: 1 ms
Lead Channel Sensing Intrinsic Amplitude: 1.4 mV
Lead Channel Sensing Intrinsic Amplitude: 5.1 mV
Lead Channel Setting Pacing Amplitude: 1.75 V
Lead Channel Setting Pacing Amplitude: 2 V
Lead Channel Setting Pacing Amplitude: 2.5 V
Lead Channel Setting Pacing Pulse Width: 0.5 ms
Lead Channel Setting Pacing Pulse Width: 1 ms
Lead Channel Setting Sensing Sensitivity: 4 mV
Pulse Gen Model: 3562
Pulse Gen Serial Number: 9097773

## 2023-10-16 ENCOUNTER — Encounter: Payer: Self-pay | Admitting: Internal Medicine

## 2023-10-16 ENCOUNTER — Ambulatory Visit (INDEPENDENT_AMBULATORY_CARE_PROVIDER_SITE_OTHER): Payer: Medicare Other

## 2023-10-16 DIAGNOSIS — E538 Deficiency of other specified B group vitamins: Secondary | ICD-10-CM | POA: Diagnosis not present

## 2023-10-16 MED ORDER — CYANOCOBALAMIN 1000 MCG/ML IJ SOLN
1000.0000 ug | Freq: Once | INTRAMUSCULAR | Status: AC
  Administered 2023-10-16: 1000 ug via INTRAMUSCULAR

## 2023-10-16 NOTE — Progress Notes (Signed)
 Patient visits today for their b-12 injection. Patient informed of what she had received and tolerated injection well. Patient notified to reach out to office if needed.

## 2023-10-17 DIAGNOSIS — N1832 Chronic kidney disease, stage 3b: Secondary | ICD-10-CM | POA: Diagnosis not present

## 2023-10-17 DIAGNOSIS — I129 Hypertensive chronic kidney disease with stage 1 through stage 4 chronic kidney disease, or unspecified chronic kidney disease: Secondary | ICD-10-CM | POA: Diagnosis not present

## 2023-10-17 DIAGNOSIS — N2581 Secondary hyperparathyroidism of renal origin: Secondary | ICD-10-CM | POA: Diagnosis not present

## 2023-10-17 DIAGNOSIS — D631 Anemia in chronic kidney disease: Secondary | ICD-10-CM | POA: Diagnosis not present

## 2023-10-17 DIAGNOSIS — N189 Chronic kidney disease, unspecified: Secondary | ICD-10-CM | POA: Diagnosis not present

## 2023-10-24 ENCOUNTER — Other Ambulatory Visit (HOSPITAL_COMMUNITY): Payer: Self-pay | Admitting: *Deleted

## 2023-10-28 ENCOUNTER — Ambulatory Visit (HOSPITAL_COMMUNITY)
Admission: RE | Admit: 2023-10-28 | Discharge: 2023-10-28 | Disposition: A | Discharge status: Home / Self Care | Source: Ambulatory Visit | Attending: Nephrology | Admitting: Nephrology

## 2023-10-28 DIAGNOSIS — N189 Chronic kidney disease, unspecified: Secondary | ICD-10-CM | POA: Insufficient documentation

## 2023-10-28 DIAGNOSIS — D631 Anemia in chronic kidney disease: Secondary | ICD-10-CM | POA: Insufficient documentation

## 2023-10-28 MED ORDER — SODIUM CHLORIDE 0.9 % IV SOLN
510.0000 mg | Freq: Once | INTRAVENOUS | Status: AC
  Administered 2023-10-28: 510 mg via INTRAVENOUS
  Filled 2023-10-28: qty 510

## 2023-11-10 ENCOUNTER — Emergency Department (HOSPITAL_COMMUNITY)

## 2023-11-10 ENCOUNTER — Emergency Department (HOSPITAL_COMMUNITY): Admission: EM | Admit: 2023-11-10 | Discharge: 2023-11-10 | Disposition: A | Discharge status: Home / Self Care

## 2023-11-10 ENCOUNTER — Encounter (HOSPITAL_COMMUNITY): Payer: Self-pay

## 2023-11-10 DIAGNOSIS — I6782 Cerebral ischemia: Secondary | ICD-10-CM | POA: Diagnosis not present

## 2023-11-10 DIAGNOSIS — S0990XA Unspecified injury of head, initial encounter: Secondary | ICD-10-CM | POA: Insufficient documentation

## 2023-11-10 DIAGNOSIS — S199XXA Unspecified injury of neck, initial encounter: Secondary | ICD-10-CM | POA: Diagnosis not present

## 2023-11-10 DIAGNOSIS — S0081XA Abrasion of other part of head, initial encounter: Secondary | ICD-10-CM | POA: Insufficient documentation

## 2023-11-10 DIAGNOSIS — Z23 Encounter for immunization: Secondary | ICD-10-CM | POA: Diagnosis not present

## 2023-11-10 DIAGNOSIS — Z7901 Long term (current) use of anticoagulants: Secondary | ICD-10-CM | POA: Diagnosis not present

## 2023-11-10 DIAGNOSIS — R0902 Hypoxemia: Secondary | ICD-10-CM | POA: Diagnosis not present

## 2023-11-10 DIAGNOSIS — W01198A Fall on same level from slipping, tripping and stumbling with subsequent striking against other object, initial encounter: Secondary | ICD-10-CM | POA: Insufficient documentation

## 2023-11-10 DIAGNOSIS — I959 Hypotension, unspecified: Secondary | ICD-10-CM | POA: Diagnosis not present

## 2023-11-10 DIAGNOSIS — M4312 Spondylolisthesis, cervical region: Secondary | ICD-10-CM | POA: Diagnosis not present

## 2023-11-10 DIAGNOSIS — M5030 Other cervical disc degeneration, unspecified cervical region: Secondary | ICD-10-CM | POA: Diagnosis not present

## 2023-11-10 DIAGNOSIS — I6529 Occlusion and stenosis of unspecified carotid artery: Secondary | ICD-10-CM | POA: Diagnosis not present

## 2023-11-10 DIAGNOSIS — W19XXXA Unspecified fall, initial encounter: Secondary | ICD-10-CM | POA: Diagnosis not present

## 2023-11-10 MED ORDER — TETANUS-DIPHTH-ACELL PERTUSSIS 5-2.5-18.5 LF-MCG/0.5 IM SUSY
0.5000 mL | PREFILLED_SYRINGE | Freq: Once | INTRAMUSCULAR | Status: AC
  Administered 2023-11-10: 0.5 mL via INTRAMUSCULAR
  Filled 2023-11-10: qty 0.5

## 2023-11-10 NOTE — Discharge Instructions (Signed)
Your workup today was reassuring.  Please take Tylenol as needed for pain.  Follow-up with your doctor.  Return to the ER for worsening symptoms.

## 2023-11-10 NOTE — Progress Notes (Signed)
 Orthopedic Tech Progress Note Patient Details:  ROLANDO HESSLING Aug 05, 1932 [id number]  Patient ID: Lindaann Slough, male   DOB: 1932-03-22, 89 y.o.   MRN: [id number] Level II; not currently needed. Darleen Crocker 11/10/2023, 2:18 PM

## 2023-11-10 NOTE — ED Provider Notes (Signed)
 [place] EMERGENCY DEPARTMENT AT [facility] Provider Note   CSN: [id number] Arrival date & time: 11/10/23  1412     History  Chief Complaint  Patient presents with   [name] is a 89 y.o. male.  89 year old male with past medical history of atrial fibrillation on Eliquis presenting to the emergency department today after a mechanical fall.  The patient tripped and fell earlier.  He did hit his head.  He was have difficulty getting up at that time and was brought into the ER for further evaluation.  It is like he may have been on the ground for an hour or 2.  He denies any symptoms at this time and is feeling well.  He is placed in a c-collar.  Denies any focal weakness, numbness, or tingling.   Fall       Home Medications Prior to Admission medications   Medication Sig Start Date End Date Taking? Authorizing Provider  acetaminophen (TYLENOL) 650 MG CR tablet Take 1,300 mg by mouth 2 (two) times daily.    [provider]  amLODipine (NORVASC) 2.5 MG tablet Take 1 tablet (2.5 mg total) by mouth daily. 08/21/23   Corwin Levins, MD  CALCIUM PO Take 2 tablets by mouth daily.     [provider]  Cholecalciferol (VITAMIN D) 50 MCG (2000 UT) CAPS Take 2,000 Units by mouth daily.    [provider]  clopidogrel (PLAVIX) 75 MG tablet Take 1 tablet (75 mg total) by mouth daily. 03/05/23   Corwin Levins, MD  fish oil-omega-3 fatty acids 1000 MG capsule Take 1 g by mouth 2 (two) times daily.    [provider]  gemfibrozil (LOPID) 600 MG tablet TAKE 1 TABLET BY MOUTH DAILY. GENERIC EQUIVALENT FOR LOPID 04/11/23   Corwin Levins, MD  levothyroxine (SYNTHROID) 50 MCG tablet Take 1 tablet (50 mcg total) by mouth daily before breakfast. 03/05/23   Corwin Levins, MD  metoprolol succinate (TOPROL-XL) 50 MG 24 hr tablet Take 1 tablet (50 mg total) by mouth daily. Take with or immediately following a meal. 03/05/23   Corwin Levins, MD   Multiple Vitamins-Minerals (MENS MULTIVITAMIN PLUS PO) Take 1 tablet by mouth daily.    [provider]  nystatin cream (MYCOSTATIN) Apply 1 Application topically daily as needed for dry skin. 12/12/20   [provider]  omeprazole (PRILOSEC) 20 MG capsule Take 1 capsule (20 mg total) by mouth daily. 09/18/23   Corwin Levins, MD  polyethylene glycol [facility] / Ethelene Hal) packet Take 17 g by mouth daily as needed for mild constipation. Take 17g mixed with liquid once daily, may do a second dose as needed for constipation    [provider]  simvastatin (ZOCOR) 40 MG tablet Take 1 tablet (40 mg total) by mouth at bedtime. 03/05/23   Corwin Levins, MD      Allergies    Pantoprazole and Zetia [ezetimibe]    Review of Systems   Review of Systems  Musculoskeletal:  Positive for neck pain.  All other systems reviewed and are negative.   Physical Exam Updated Vital Signs BP (!) 142/58   Pulse 68   Temp 97.9 F (36.6 C) (Oral)   Resp 14   Ht 6' (1.829 m)   Wt 72.6 kg   SpO2 96%   BMI 21.70 kg/m  Physical Exam Vitals and nursing note reviewed.   Gen: NAD Eyes: PERRL, EOMI  HEENT: no oropharyngeal swelling, abrasion noted over left forehead Neck: trachea midline, c-collar in place Resp: clear to auscultation bilaterally Card: RRR, no murmurs, rubs, or gallops Abd: nontender, nondistended Extremities: no calf tenderness, no edema Vascular: 2+ radial pulses bilaterally, 2+ DP pulses bilaterally Neuro: Equal strength and sensation throughout bilateral upper and lower extremities Skin: no rashes Psyc: acting appropriately   ED Results / Procedures / Treatments   Labs (all labs ordered are listed, but only abnormal results are displayed) Labs Reviewed - No data to display  EKG None  Radiology CT Cervical Spine Wo Contrast Result Date: 11/10/2023 CLINICAL DATA:  Neck trauma (Age >= 65y) EXAM: CT CERVICAL SPINE WITHOUT CONTRAST TECHNIQUE: Multidetector CT  imaging of the cervical spine was performed without intravenous contrast. Multiplanar CT image reconstructions were also generated. RADIATION DOSE REDUCTION: This exam was performed according to the departmental dose-optimization program which includes automated exposure control, adjustment of the mA and/or kV according to patient size and/or use of iterative reconstruction technique. COMPARISON:  None Available. FINDINGS: Alignment: Exaggerated cervical lordosis. Grade 1 degenerative anterolisthesis of C6 on C7. No traumatic subluxation. Skull base and vertebrae: No acute fracture. Vertebral body heights are maintained. The dens and skull base are intact. Soft tissues and spinal canal: No prevertebral fluid or swelling. No visible canal hematoma. Disc levels: Mild multilevel degenerative disc disease, most prominent at C4-C5. No high-grade spinal canal stenosis. Upper chest: No acute findings. Other: Carotid calcifications. IMPRESSION: 1. No acute fracture or subluxation of the cervical spine. 2. Mild multilevel degenerative disc disease. Electronically Signed   By: Narda Rutherford M.D.   On: 11/10/2023 15:14   CT Head Wo Contrast Result Date: 11/10/2023 CLINICAL DATA:  Head trauma, moderate-severe EXAM: CT HEAD WITHOUT CONTRAST TECHNIQUE: Contiguous axial images were obtained from the base of the skull through the vertex without intravenous contrast. RADIATION DOSE REDUCTION: This exam was performed according to the departmental dose-optimization program which includes automated exposure control, adjustment of the mA and/or kV according to patient size and/or use of iterative reconstruction technique. COMPARISON:  Head CT 10/16/2022 FINDINGS: Brain: No intracranial hemorrhage, mass effect, or midline shift. Age related atrophy and chronic small vessel ischemia. No hydrocephalus. The basilar cisterns are patent. 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 No evidence of territorial infarct or acute ischemia.  No extra-axial or intracranial fluid collection. Vascular: Of asked Skull: No fracture or focal lesion. Sinuses/Orbits: No acute findings.  Bilateral cataract resection Other: No confluent scalp hematoma. IMPRESSION: 1. No acute intracranial abnormality. No skull fracture. 2. Age related atrophy and chronic small vessel ischemia. 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 Electronically Signed   By: Narda Rutherford M.D.   On: 11/10/2023 15:09    Procedures Procedures    Medications Ordered in ED Medications  Tdap (BOOSTRIX) injection 0.5 mL (0.5 mLs Intramuscular Given 11/10/23 1500)    ED Course/ Medical Decision Making/ A&P                                 Medical Decision Making 89 year old male with past medical history of atrial fibrillation on Eliquis presenting to the emergency department today after a mechanical fall.  I will obtain a CT scan of his head and cervical spine for further evaluation for acute traumatic injuries.  The patient is otherwise well-appearing and denies any other symptoms.  Will update his tetanus with the abrasion.  I will reevaluate for  ultimate disposition.  The patient CT scans here are unremarkable.  He remained stable here and is ambulatory without any difficulty.  He is discharged with return precautions.  Amount and/or Complexity of Data Reviewed Radiology: ordered.  Risk Prescription drug management.          Final Clinical Impression(s) / ED Diagnoses Final diagnoses:  Closed head injury, initial encounter    Rx / DC Orders ED Discharge Orders     None         Durwin Glaze, MD 11/10/23 [phone]

## 2023-11-10 NOTE — ED Notes (Signed)
Pt ambulatory in hallway with steady gait.

## 2023-11-12 ENCOUNTER — Telehealth: Payer: Self-pay

## 2023-11-12 NOTE — Transitions of Care (Post Inpatient/ED Visit) (Unsigned)
   11/12/2023  Name: [name] MRN: [id number] DOB: 1932-07-29  Today's TOC FU Call Status: Today's TOC FU Call Status:: Unsuccessful Call (1st Attempt) Unsuccessful Call (1st Attempt) Date: 11/12/23  Attempted to reach the patient regarding the most recent Inpatient/ED visit.  Follow Up Plan: Additional outreach attempts will be made to reach the patient to complete the Transitions of Care (Post Inpatient/ED visit) call.   Signature Karena Addison, LPN [facility] Nurse Health Advisor Direct Dial [phone]

## 2023-11-13 NOTE — Transitions of Care (Post Inpatient/ED Visit) (Signed)
 11/13/2023  Name: [name] MRN: [id number] DOB: 18-Jun-1932  Today's TOC FU Call Status: Today's TOC FU Call Status:: Successful TOC FU Call Completed Unsuccessful Call (1st Attempt) Date: 11/12/23 [facility] FU Call Complete Date: 11/13/23 Patient's Name and Date of Birth confirmed.  Transition Care Management Follow-up Telephone Call Date of Discharge: 11/10/23 Discharge Facility: [facility]) Type of Discharge: Emergency Department Reason for ED Visit: Other: (fall) How have you been since you were released from the hospital?: Better Any questions or concerns?: No  Items Reviewed: Did you receive and understand the discharge instructions provided?: Yes Medications obtained,verified, and reconciled?: Yes (Medications Reviewed) Any new allergies since your discharge?: No Dietary orders reviewed?: Yes Do you have support at home?: No  Medications Reviewed Today: Medications Reviewed Today     Reviewed by Karena Addison, LPN (Licensed Practical Nurse) on 11/13/23 at 1510  Med List Status: <None>   Medication Order Taking? Sig Documenting Provider Last Dose Status Informant  acetaminophen (TYLENOL) 650 MG CR tablet [id number] No Take 1,300 mg by mouth 2 (two) times daily. [provider] Taking Active Self, Pharmacy Records  amLODipine (NORVASC) 2.5 MG tablet [id number]  Take 1 tablet (2.5 mg total) by mouth daily. Corwin Levins, MD  Active   CALCIUM PO [id number] No Take 2 tablets by mouth daily.  [provider] Taking Active Self, Pharmacy Records  Cholecalciferol (VITAMIN D) 50 MCG (2000 UT) CAPS [id number] No Take 2,000 Units by mouth daily. [provider] Taking Active Self, Pharmacy Records  clopidogrel (PLAVIX) 75 MG tablet [id number] No Take 1 tablet (75 mg total) by mouth daily. Corwin Levins, MD Taking Active   fish oil-omega-3 fatty acids 1000 MG capsule [id number] No Take 1 g by mouth 2 (two) times daily. [provider] Taking  Active Self, Pharmacy Records  gemfibrozil (LOPID) 600 MG tablet [id number] No TAKE 1 TABLET BY MOUTH DAILY. GENERIC EQUIVALENT FOR LOPID Corwin Levins, MD Taking Active   levothyroxine (SYNTHROID) 50 MCG tablet [id number] No Take 1 tablet (50 mcg total) by mouth daily before breakfast. Corwin Levins, MD Taking Active   metoprolol succinate (TOPROL-XL) 50 MG 24 hr tablet [id number] No Take 1 tablet (50 mg total) by mouth daily. Take with or immediately following a meal. Corwin Levins, MD Taking Active   Multiple Vitamins-Minerals (MENS MULTIVITAMIN PLUS PO) [id number] No Take 1 tablet by mouth daily. [provider] Taking Active Self, Pharmacy Records  nystatin cream (MYCOSTATIN) [id number] No Apply 1 Application topically daily as needed for dry skin. [provider] Taking Active Self, Pharmacy Records  omeprazole (PRILOSEC) 20 MG capsule [id number]  Take 1 capsule (20 mg total) by mouth daily. Corwin Levins, MD  Active   polyethylene glycol [facility] / Ethelene Hal) packet [id number] No Take 17 g by mouth daily as needed for mild constipation. Take 17g mixed with liquid once daily, may do a second dose as needed for constipation [provider] Taking Active Self, Pharmacy Records  simvastatin (ZOCOR) 40 MG tablet [id number] No Take 1 tablet (40 mg total) by mouth at bedtime. Corwin Levins, MD Taking Active             Home Care and Equipment/Supplies: Were Home Health Services Ordered?: NA Any new equipment or medical supplies ordered?: NA  Functional Questionnaire: Do you need assistance with bathing/showering or dressing?: No Do you need assistance with meal preparation?: No Do you need assistance with eating?: No Do you have difficulty  maintaining continence: No Do you need assistance with getting out of bed/getting out of a chair/moving?: No Do you have difficulty managing or taking your medications?: No  Follow up appointments reviewed: PCP Follow-up appointment  confirmed?: NA Specialist Hospital Follow-up appointment confirmed?: NA Do you need transportation to your follow-up appointment?: No Do you understand care options if your condition(s) worsen?: Yes-patient verbalized understanding    SIGNATURE Karena Addison, LPN [facility] Nurse Health Advisor Direct Dial [phone]

## 2023-11-15 NOTE — Progress Notes (Signed)
 Remote pacemaker transmission.

## 2023-11-15 NOTE — Addendum Note (Signed)
 Addended by: Elease Etienne A on: 11/15/2023 08:24 AM   Modules accepted: Orders

## 2023-11-18 ENCOUNTER — Ambulatory Visit (INDEPENDENT_AMBULATORY_CARE_PROVIDER_SITE_OTHER): Payer: Medicare Other

## 2023-11-18 DIAGNOSIS — E538 Deficiency of other specified B group vitamins: Secondary | ICD-10-CM | POA: Diagnosis not present

## 2023-11-18 MED ORDER — CYANOCOBALAMIN 1000 MCG/ML IJ SOLN
1000.0000 ug | Freq: Once | INTRAMUSCULAR | Status: AC
  Administered 2023-11-18: 1000 ug via INTRAMUSCULAR

## 2023-11-18 NOTE — Progress Notes (Signed)
 Patient here for monthly B12 injection per Dr. Autry Legions. B12 1000 mcg given in leftIM and patient tolerated injection well today.

## 2023-12-12 DIAGNOSIS — Z8546 Personal history of malignant neoplasm of prostate: Secondary | ICD-10-CM | POA: Diagnosis not present

## 2023-12-18 ENCOUNTER — Ambulatory Visit: Payer: Medicare Other | Admitting: Internal Medicine

## 2023-12-20 DIAGNOSIS — Z8546 Personal history of malignant neoplasm of prostate: Secondary | ICD-10-CM | POA: Diagnosis not present

## 2023-12-23 ENCOUNTER — Ambulatory Visit (INDEPENDENT_AMBULATORY_CARE_PROVIDER_SITE_OTHER): Admitting: Internal Medicine

## 2023-12-23 ENCOUNTER — Encounter: Payer: Self-pay | Admitting: Internal Medicine

## 2023-12-23 VITALS — BP 122/70 | HR 53 | Temp 97.8°F | Ht 72.0 in | Wt 162.0 lb

## 2023-12-23 DIAGNOSIS — E538 Deficiency of other specified B group vitamins: Secondary | ICD-10-CM | POA: Diagnosis not present

## 2023-12-23 DIAGNOSIS — N1831 Chronic kidney disease, stage 3a: Secondary | ICD-10-CM | POA: Diagnosis not present

## 2023-12-23 DIAGNOSIS — I10 Essential (primary) hypertension: Secondary | ICD-10-CM

## 2023-12-23 DIAGNOSIS — E559 Vitamin D deficiency, unspecified: Secondary | ICD-10-CM | POA: Diagnosis not present

## 2023-12-23 DIAGNOSIS — R739 Hyperglycemia, unspecified: Secondary | ICD-10-CM | POA: Diagnosis not present

## 2023-12-23 DIAGNOSIS — E039 Hypothyroidism, unspecified: Secondary | ICD-10-CM | POA: Diagnosis not present

## 2023-12-23 LAB — BASIC METABOLIC PANEL WITH GFR
BUN: 26 mg/dL — ABNORMAL HIGH (ref 6–23)
CO2: 29 meq/L (ref 19–32)
Calcium: 10.2 mg/dL (ref 8.4–10.5)
Chloride: 103 meq/L (ref 96–112)
Creatinine, Ser: 1.6 mg/dL — ABNORMAL HIGH (ref 0.40–1.50)
GFR: 37.96 mL/min — ABNORMAL LOW (ref >60.00)
Glucose, Bld: 103 mg/dL — ABNORMAL HIGH (ref 70–99)
Potassium: 4.9 meq/L (ref 3.5–5.1)
Sodium: 139 meq/L (ref 135–145)

## 2023-12-23 LAB — CBC WITH DIFFERENTIAL/PLATELET
Basophils Absolute: 0 10*3/uL (ref 0.0–0.1)
Basophils Relative: 0.6 % (ref 0.0–3.0)
Eosinophils Absolute: 0.2 10*3/uL (ref 0.0–0.7)
Eosinophils Relative: 5.3 % — ABNORMAL HIGH (ref 0.0–5.0)
HCT: 30.1 % — ABNORMAL LOW (ref 39.0–52.0)
Hemoglobin: 10.5 g/dL — ABNORMAL LOW (ref 13.0–17.0)
Lymphocytes Relative: 28.1 % (ref 12.0–46.0)
Lymphs Abs: 1.2 10*3/uL (ref 0.7–4.0)
MCHC: 34.9 g/dL (ref 30.0–36.0)
MCV: 93.7 fl (ref 78.0–100.0)
Monocytes Absolute: 0.4 10*3/uL (ref 0.1–1.0)
Monocytes Relative: 10.9 % (ref 3.0–12.0)
Neutro Abs: 2.3 10*3/uL (ref 1.4–7.7)
Neutrophils Relative %: 55.1 % (ref 43.0–77.0)
Platelets: 223 10*3/uL (ref 150.0–400.0)
RBC: 3.21 Mil/uL — ABNORMAL LOW (ref 4.22–5.81)
RDW: 14.3 % (ref 11.5–15.5)
WBC: 4.1 10*3/uL (ref 4.0–10.5)

## 2023-12-23 LAB — URINALYSIS, ROUTINE W REFLEX MICROSCOPIC
Bilirubin Urine: NEGATIVE
Hgb urine dipstick: NEGATIVE
Ketones, ur: NEGATIVE
Leukocytes,Ua: NEGATIVE
Nitrite: NEGATIVE
Specific Gravity, Urine: 1.01 (ref 1.000–1.030)
Total Protein, Urine: NEGATIVE
Urine Glucose: NEGATIVE
Urobilinogen, UA: 0.2 (ref 0.0–1.0)
pH: 6 (ref 5.0–8.0)

## 2023-12-23 LAB — LIPID PANEL
Cholesterol: 149 mg/dL (ref 0–200)
HDL: 40.3 mg/dL (ref >39.00)
LDL Cholesterol: 80 mg/dL (ref 0–99)
NonHDL: 108.38
Total CHOL/HDL Ratio: 4
Triglycerides: 142 mg/dL (ref 0.0–149.0)
VLDL: 28.4 mg/dL (ref 0.0–40.0)

## 2023-12-23 LAB — HEPATIC FUNCTION PANEL
ALT: 10 U/L (ref 0–53)
AST: 25 U/L (ref 0–37)
Albumin: 4.7 g/dL (ref 3.5–5.2)
Alkaline Phosphatase: 65 U/L (ref 39–117)
Bilirubin, Direct: 0.1 mg/dL (ref 0.0–0.3)
Total Bilirubin: 0.6 mg/dL (ref 0.2–1.2)
Total Protein: 7.2 g/dL (ref 6.0–8.3)

## 2023-12-23 LAB — MICROALBUMIN / CREATININE URINE RATIO
Creatinine,U: 93.5 mg/dL
Microalb Creat Ratio: UNDETERMINED mg/g (ref 0.0–30.0)
Microalb, Ur: <0.7 mg/dL

## 2023-12-23 LAB — HEMOGLOBIN A1C: Hgb A1c MFr Bld: 5.4 % (ref 4.6–6.5)

## 2023-12-23 MED ORDER — CYANOCOBALAMIN 1000 MCG/ML IJ SOLN
1000.0000 ug | Freq: Once | INTRAMUSCULAR | Status: AC
Start: 2023-12-23 — End: 2023-12-23
  Administered 2023-12-23: 1000 ug via INTRAMUSCULAR

## 2023-12-23 NOTE — Progress Notes (Signed)
 Patient ID: [name], male   DOB: [birth year], 89 y.o.   MRN: [id number]         Chief Complaint:: yearly exam       HPI:  [name] is a 89 y.o. male here overall doing ok, Pt denies chest pain, increased sob or doe, wheezing, orthopnea, PND, increased LE swelling, palpitations, dizziness or syncope.   Pt denies polydipsia, polyuria, or new focal neuro s/s.    Pt denies fever, wt loss, night sweats, loss of appetite, or other constitutional symptoms Due for B12 monthly injection.  No recent overt bleeding.  Conts to follow with Dr Lydia Sams renal.  Does c/o ongoing fatigue, but denies signficant daytime hypersomnolence.  Did have IV iron infusion mar 2025 per renal      Wt Readings from Last 3 Encounters:  12/23/23 162 lb (73.5 kg)  11/10/23 160 lb (72.6 kg)  10/28/23 165 lb (74.8 kg)   BP Readings from Last 3 Encounters:  12/23/23 122/70  11/10/23 (!) 142/58  10/28/23 124/60   Immunization History  Administered Date(s) Administered   Fluad Quad(high Dose 65+) 04/30/2019, 05/05/2020, 05/02/2022   Fluad Trivalent(High Dose 65+) 04/18/2023   H1N1 08/04/2008   Influenza Split 05/10/2012, 05/11/2013   Influenza Whole 05/04/2008, 06/21/2009, 05/08/2010   Influenza, High Dose Seasonal PF 05/01/2017, 05/05/2018   Influenza,inj,Quad PF,6+ Mos 04/09/2014, 05/07/2016   Influenza,inj,quad, With Preservative 06/06/2017   Influenza-Unspecified 05/07/2015   Moderna Sars-Covid-2 Vaccination 08/07/2019, 09/07/2019, 09/19/2020   Pneumococcal Conjugate-13 09/09/2014   Pneumococcal Polysaccharide-23 05/17/2000, 08/30/2009   Tdap 01/30/2019, 11/10/2023   Health Maintenance Due  Topic Date Due   Medicare Annual Wellness (AWV)  Never done   Zoster Vaccines- Shingrix (1 of 2) Never done      Past Medical History:  Diagnosis Date   AICD (automatic cardioverter/defibrillator) present    "turned it off today, put in an extra lead,  and made it just a pacemaker" (08/21/2018)   Anemia,  unspecified    Aortic stenosis    Mild, echo, October, 2011   Arthritis    "joints; arms"  (08/21/2018)   AV block, complete (HCC)    Permanent pacemaker   CAD (coronary artery disease)    DES to LAD 2005 /  DES to LAD 2008   Cardiomyopathy    Ischemic, EF 30%, 2005   Chronic bronchitis (HCC)    DJD (degenerative joint disease)    Dyslipidemia    Ejection fraction < 50%    EF 30%, echo, 2005  /  EF 30%, catheterization, 2008   GERD (gastroesophageal reflux disease)    Hyperlipidemia    Hypertension    Hypothyroidism    ICD (implantable cardiac defibrillator), single, in situ    for syncope and arrhythmia 2005 / generator change June, 2011   Iron deficiency anemia 06/17/2018   LBBB (left bundle branch block)    Mitral regurgitation    Mild, echo, October, 2011   Myocardial infarction [name]) 1989   Presence of permanent cardiac pacemaker    Prostate cancer [name])    Rectal fissure    Syncope    2005, ICD placed   Systolic CHF, chronic (HCC)    Vitamin B12 deficiency    Past Surgical History:  Procedure Laterality Date   BIV UPGRADE  08/21/2018   BIV UPGRADE N/A 08/21/2018   Procedure: BIV UPGRADE;  Surgeon: Tammie Fall, MD;  Location: [name];  Service: Cardiovascular;  Laterality: N/A;   CARDIAC CATHETERIZATION  2008   ICD IMPLANT  2005   INSERTION PROSTATE RADIATION SEED     LAPAROSCOPIC CHOLECYSTECTOMY  04/2005   Dr. Odean Bend   PROSTATE BIOPSY     RECTAL DILATATION     "stretched it twice" (08/21/2018)   SHOULDER OPEN ROTATOR CUFF REPAIR Right     reports that he quit smoking about 71 years ago. His smoking use included cigarettes. He started smoking about 73 years ago. He has a 2 pack-year smoking history. He has never used smokeless tobacco. He reports that he does not drink alcohol and does not use drugs. family history includes Breast cancer in his maternal aunt; Cancer (age of onset: 59) in his mother; Heart Problems in his brother and father; Heart  attack in his brother; Heart attack (age of onset: 41) in his father; Lung cancer in his brother and sister; Other in his sister; Stroke in his brother and mother. Allergies  Allergen Reactions   Pantoprazole  Other (See Comments)    B12 levels dropped on medication   Zetia  [Ezetimibe ] Other (See Comments)    Arm weakness   Current Outpatient Medications on File Prior to Visit  Medication Sig Dispense Refill   acetaminophen  (TYLENOL ) 650 MG CR tablet Take 1,300 mg by mouth 2 (two) times daily.     amLODipine  (NORVASC ) 2.5 MG tablet Take 1 tablet (2.5 mg total) by mouth daily. 90 tablet 3   CALCIUM PO Take 2 tablets by mouth daily.      Cholecalciferol (VITAMIN D ) 50 MCG (2000 UT) CAPS Take 2,000 Units by mouth daily.     clopidogrel  (PLAVIX ) 75 MG tablet Take 1 tablet (75 mg total) by mouth daily. 90 tablet 3   fish oil-omega-3 fatty acids 1000 MG capsule Take 1 g by mouth 2 (two) times daily.     gemfibrozil  (LOPID ) 600 MG tablet TAKE 1 TABLET BY MOUTH DAILY. GENERIC EQUIVALENT FOR LOPID  90 tablet 3   levothyroxine  (SYNTHROID ) 50 MCG tablet Take 1 tablet (50 mcg total) by mouth daily before breakfast. 90 tablet 3   metoprolol  succinate (TOPROL -XL) 50 MG 24 hr tablet Take 1 tablet (50 mg total) by mouth daily. Take with or immediately following a meal. 90 tablet 3   Multiple Vitamins-Minerals (MENS MULTIVITAMIN PLUS PO) Take 1 tablet by mouth daily.     nystatin cream (MYCOSTATIN) Apply 1 Application topically daily as needed for dry skin.     omeprazole  (PRILOSEC) 20 MG capsule Take 1 capsule (20 mg total) by mouth daily. 90 capsule 3   polyethylene glycol (MIRALAX  / GLYCOLAX ) packet Take 17 g by mouth daily as needed for mild constipation. Take 17g mixed with liquid once daily, may do a second dose as needed for constipation     simvastatin  (ZOCOR ) 40 MG tablet Take 1 tablet (40 mg total) by mouth at bedtime. 90 tablet 3   No current facility-administered medications on file prior to visit.         ROS:  All others reviewed and negative.  Objective        PE:  BP 122/70 (BP Location: Right Arm, Patient Position: Sitting, Cuff Size: Normal)   Pulse (!) 53   Temp 97.8 F (36.6 C) (Oral)   Ht 6' (1.829 m)   Wt 162 lb (73.5 kg)   SpO2 98%   BMI 21.97 kg/m                 Constitutional: Pt appears in NAD  HENT: Head: NCAT.                Right Ear: External ear normal.                 Left Ear: External ear normal.                Eyes: . Pupils are equal, round, and reactive to light. Conjunctivae and EOM are normal               Nose: without d/c or deformity               Neck: Neck supple. Gross normal ROM               Cardiovascular: Normal rate and regular rhythm.                 Pulmonary/Chest: Effort normal and breath sounds without rales or wheezing.                Abd:  Soft, NT, ND, + BS, no organomegaly               Neurological: Pt is alert. At baseline orientation, motor grossly intact               Skin: Skin is warm. No rashes, no other new lesions, LE edema - none               Psychiatric: Pt behavior is normal without agitation   Micro: none  Cardiac tracings I have personally interpreted today:  none  Pertinent Radiological findings (summarize): none   [facility] Results  Component Value Date   WBC 4.1 12/23/2023   HGB 10.5 (L) 12/23/2023   HCT 30.1 (L) 12/23/2023   PLT 223.0 12/23/2023   GLUCOSE 103 (H) 12/23/2023   CHOL 149 12/23/2023   TRIG 142.0 12/23/2023   HDL 40.30 12/23/2023   LDLDIRECT 95.0 08/08/2022   LDLCALC 80 12/23/2023   ALT 10 12/23/2023   AST 25 12/23/2023   NA 139 12/23/2023   K 4.9 12/23/2023   CL 103 12/23/2023   CREATININE 1.60 (H) 12/23/2023   BUN 26 (H) 12/23/2023   CO2 29 12/23/2023   TSH 4.48 12/23/2023   PSA 0.00 (L) 01/24/2021   INR 0.95 01/06/2010   HGBA1C 5.4 12/23/2023   MICROALBUR <0.7 12/23/2023   Assessment/Plan:  [name] is a 89 y.o. White or Caucasian [1] male with  has a  past medical history of AICD (automatic cardioverter/defibrillator) present, Anemia, unspecified, Aortic stenosis, Arthritis, AV block, complete (HCC), CAD (coronary artery disease), Cardiomyopathy, Chronic bronchitis (HCC), DJD (degenerative joint disease), Dyslipidemia, Ejection fraction < 50%, GERD (gastroesophageal reflux disease), Hyperlipidemia, Hypertension, Hypothyroidism, ICD (implantable cardiac defibrillator), single, in situ, Iron deficiency anemia (06/17/2018), LBBB (left bundle branch block), Mitral regurgitation, Myocardial infarction (HCC) (1989), Presence of permanent cardiac pacemaker, Prostate cancer (HCC), Rectal fissure, Syncope, Systolic CHF, chronic (HCC), and Vitamin B12 deficiency.  Vitamin B 12 deficiency For b12 1000 mg im  Hypertension BP Readings from Last 3 Encounters:  12/23/23 122/70  11/10/23 (!) 142/58  10/28/23 124/60   Stable, pt to continue medical treatment norvasc  2.5 mg every day, toprol  xl 50 qd   Hyperglycemia [facility] Results  Component Value Date   HGBA1C 5.4 12/23/2023   Stable, pt to continue current medical treatment  - diet, wt control   CKD (chronic kidney disease) stage 3, GFR 30-59 ml/min (HCC) [facility] Results  Component Value Date  CREATININE 1.60 (H) 12/23/2023   Stable overall, cont to avoid nephrotoxins   Hypothyroidism [facility] Results  Component Value Date   TSH 4.48 12/23/2023   Stable, pt to continue levothyroxine  50 mcg qd  Followup: Return in about 6 months (around 06/24/2024).  Rosalia Colonel, MD 12/25/2023 4:46 AM [facility]

## 2023-12-23 NOTE — Patient Instructions (Signed)
 You had the B12 shot AFTER your lab testing today  Please continue all other medications as before, and refills have been done if requested.  Please have the pharmacy call with any other refills you may need.  Please continue your efforts at being more active, low cholesterol diet, and weight control.  You are otherwise up to date with prevention measures today.  Please keep your appointments with your specialists as you may have planned  Please go to the LAB at the blood drawing area for the tests to be done  You will be contacted by phone if any changes need to be made immediately.  Otherwise, you will receive a letter about your results with an explanation, but please check with MyChart first.  Please make an Appointment to return in 6 months, or sooner if needed

## 2023-12-24 ENCOUNTER — Ambulatory Visit: Payer: Self-pay | Admitting: Internal Medicine

## 2023-12-24 LAB — VITAMIN B12: Vitamin B-12: 222 pg/mL (ref 211–911)

## 2023-12-24 LAB — TSH: TSH: 4.48 u[IU]/mL (ref 0.35–5.50)

## 2023-12-24 LAB — VITAMIN D 25 HYDROXY (VIT D DEFICIENCY, FRACTURES): VITD: 96.8 ng/mL (ref 30.00–100.00)

## 2023-12-24 NOTE — Progress Notes (Signed)
 The test results show that your current treatment is OK, as the tests are stable.  Please continue the same plan.  There is no other need for change of treatment or further evaluation based on these results, at this time.  thanks

## 2023-12-25 ENCOUNTER — Encounter: Payer: Self-pay | Admitting: Internal Medicine

## 2023-12-25 NOTE — Assessment & Plan Note (Signed)
 Lab Results  Component Value Date   HGBA1C 5.4 12/23/2023   Stable, pt to continue current medical treatment  - diet, wt control

## 2023-12-25 NOTE — Assessment & Plan Note (Signed)
 BP Readings from Last 3 Encounters:  12/23/23 122/70  11/10/23 (!) 142/58  10/28/23 124/60   Stable, pt to continue medical treatment norvasc  2.5 mg every day, toprol  xl 50 qd

## 2023-12-25 NOTE — Assessment & Plan Note (Signed)
 For b12 1000 mg im

## 2023-12-25 NOTE — Assessment & Plan Note (Signed)
 Lab Results  Component Value Date   CREATININE 1.60 (H) 12/23/2023   Stable overall, cont to avoid nephrotoxins

## 2023-12-25 NOTE — Assessment & Plan Note (Signed)
 Lab Results  Component Value Date   TSH 4.48 12/23/2023   Stable, pt to continue levothyroxine  50 mcg qd

## 2024-01-09 ENCOUNTER — Ambulatory Visit: Payer: Self-pay | Admitting: Internal Medicine

## 2024-01-09 ENCOUNTER — Ambulatory Visit (INDEPENDENT_AMBULATORY_CARE_PROVIDER_SITE_OTHER): Payer: Medicare Other

## 2024-01-09 DIAGNOSIS — I255 Ischemic cardiomyopathy: Secondary | ICD-10-CM | POA: Diagnosis not present

## 2024-01-09 LAB — CUP PACEART REMOTE DEVICE CHECK
Battery Remaining Longevity: 19 mo
Battery Remaining Percentage: 25 %
Battery Voltage: 2.92 V
Brady Statistic AP VP Percent: 89 %
Brady Statistic AP VS Percent: 1 %
Brady Statistic AS VP Percent: 11 %
Brady Statistic AS VS Percent: 1 %
Brady Statistic RA Percent Paced: 88 %
Date Time Interrogation Session: 20250605020020
Implantable Lead Connection Status: 753985
Implantable Lead Connection Status: 753985
Implantable Lead Connection Status: 753985
Implantable Lead Implant Date: 20050509
Implantable Lead Implant Date: 20050509
Implantable Lead Implant Date: 20200116
Implantable Lead Location: 753858
Implantable Lead Location: 753859
Implantable Lead Location: 753860
Implantable Lead Model: 1580
Implantable Pulse Generator Implant Date: 20200116
Lead Channel Impedance Value: 300 Ohm
Lead Channel Impedance Value: 360 Ohm
Lead Channel Impedance Value: 750 Ohm
Lead Channel Pacing Threshold Amplitude: 0.75 V
Lead Channel Pacing Threshold Amplitude: 1 V
Lead Channel Pacing Threshold Amplitude: 1 V
Lead Channel Pacing Threshold Pulse Width: 0.5 ms
Lead Channel Pacing Threshold Pulse Width: 0.5 ms
Lead Channel Pacing Threshold Pulse Width: 1 ms
Lead Channel Sensing Intrinsic Amplitude: 1.5 mV
Lead Channel Sensing Intrinsic Amplitude: 5.1 mV
Lead Channel Setting Pacing Amplitude: 1.75 V
Lead Channel Setting Pacing Amplitude: 2 V
Lead Channel Setting Pacing Amplitude: 2.5 V
Lead Channel Setting Pacing Pulse Width: 0.5 ms
Lead Channel Setting Pacing Pulse Width: 1 ms
Lead Channel Setting Sensing Sensitivity: 4 mV
Pulse Gen Model: 3562
Pulse Gen Serial Number: 9097773

## 2024-01-20 ENCOUNTER — Ambulatory Visit (INDEPENDENT_AMBULATORY_CARE_PROVIDER_SITE_OTHER)

## 2024-01-20 DIAGNOSIS — E538 Deficiency of other specified B group vitamins: Secondary | ICD-10-CM

## 2024-01-20 MED ORDER — CYANOCOBALAMIN 1000 MCG/ML IJ SOLN
100.0000 ug | Freq: Once | INTRAMUSCULAR | Status: AC
  Administered 2024-01-20: 100 ug via INTRAMUSCULAR

## 2024-01-20 NOTE — Progress Notes (Signed)
 Pt here for monthly B12 injection per Dr.John  B12 1000mcg given IM and pt tolerated injection well.  Next B12 injection scheduled for 07/17

## 2024-02-10 DIAGNOSIS — N390 Urinary tract infection, site not specified: Secondary | ICD-10-CM | POA: Diagnosis not present

## 2024-02-10 DIAGNOSIS — N1832 Chronic kidney disease, stage 3b: Secondary | ICD-10-CM | POA: Diagnosis not present

## 2024-02-17 DIAGNOSIS — D631 Anemia in chronic kidney disease: Secondary | ICD-10-CM | POA: Diagnosis not present

## 2024-02-17 DIAGNOSIS — I129 Hypertensive chronic kidney disease with stage 1 through stage 4 chronic kidney disease, or unspecified chronic kidney disease: Secondary | ICD-10-CM | POA: Diagnosis not present

## 2024-02-17 DIAGNOSIS — N2581 Secondary hyperparathyroidism of renal origin: Secondary | ICD-10-CM | POA: Diagnosis not present

## 2024-02-17 DIAGNOSIS — N1831 Chronic kidney disease, stage 3a: Secondary | ICD-10-CM | POA: Diagnosis not present

## 2024-02-20 ENCOUNTER — Ambulatory Visit

## 2024-02-20 DIAGNOSIS — E538 Deficiency of other specified B group vitamins: Secondary | ICD-10-CM

## 2024-02-20 MED ORDER — CYANOCOBALAMIN 1000 MCG/ML IJ SOLN
1000.0000 ug | Freq: Once | INTRAMUSCULAR | Status: AC
  Administered 2024-02-20: 1000 ug via INTRAMUSCULAR

## 2024-02-20 NOTE — Progress Notes (Signed)
 Patient here for monthly B12 injection per Dr. Autry Legions. B12 1000 mcg given in leftIM and patient tolerated injection well today.

## 2024-02-27 ENCOUNTER — Other Ambulatory Visit: Payer: Self-pay | Admitting: Internal Medicine

## 2024-02-27 NOTE — Progress Notes (Signed)
 Remote pacemaker transmission.

## 2024-03-11 ENCOUNTER — Other Ambulatory Visit: Payer: Self-pay | Admitting: Internal Medicine

## 2024-03-25 ENCOUNTER — Ambulatory Visit

## 2024-03-25 DIAGNOSIS — E538 Deficiency of other specified B group vitamins: Secondary | ICD-10-CM | POA: Diagnosis not present

## 2024-03-25 MED ORDER — CYANOCOBALAMIN 1000 MCG/ML IJ SOLN
1000.0000 ug | Freq: Once | INTRAMUSCULAR | Status: AC
  Administered 2024-03-25: 1000 ug via INTRAMUSCULAR

## 2024-03-25 NOTE — Progress Notes (Signed)
 After obtaining consent, and per orders of Dr. Jonny Ruiz, injection of B12 given by Ferdie Ping. Patient instructed to report any adverse reaction to me immediately.

## 2024-04-09 ENCOUNTER — Ambulatory Visit: Payer: Medicare Other

## 2024-04-09 DIAGNOSIS — I255 Ischemic cardiomyopathy: Secondary | ICD-10-CM | POA: Diagnosis not present

## 2024-04-10 LAB — CUP PACEART REMOTE DEVICE CHECK
Battery Remaining Longevity: 17 mo
Battery Remaining Percentage: 21 %
Battery Voltage: 2.9 V
Brady Statistic AP VP Percent: 89 %
Brady Statistic AP VS Percent: 1 %
Brady Statistic AS VP Percent: 11 %
Brady Statistic AS VS Percent: 1 %
Brady Statistic RA Percent Paced: 88 %
Date Time Interrogation Session: 20250904040042
Implantable Lead Connection Status: 753985
Implantable Lead Connection Status: 753985
Implantable Lead Connection Status: 753985
Implantable Lead Implant Date: 20050509
Implantable Lead Implant Date: 20050509
Implantable Lead Implant Date: 20200116
Implantable Lead Location: 753858
Implantable Lead Location: 753859
Implantable Lead Location: 753860
Implantable Lead Model: 1580
Implantable Pulse Generator Implant Date: 20200116
Lead Channel Impedance Value: 280 Ohm
Lead Channel Impedance Value: 390 Ohm
Lead Channel Impedance Value: 790 Ohm
Lead Channel Pacing Threshold Amplitude: 0.75 V
Lead Channel Pacing Threshold Amplitude: 1 V
Lead Channel Pacing Threshold Amplitude: 1 V
Lead Channel Pacing Threshold Pulse Width: 0.5 ms
Lead Channel Pacing Threshold Pulse Width: 0.5 ms
Lead Channel Pacing Threshold Pulse Width: 1 ms
Lead Channel Sensing Intrinsic Amplitude: 1.9 mV
Lead Channel Sensing Intrinsic Amplitude: 5.1 mV
Lead Channel Setting Pacing Amplitude: 1.75 V
Lead Channel Setting Pacing Amplitude: 2 V
Lead Channel Setting Pacing Amplitude: 2.5 V
Lead Channel Setting Pacing Pulse Width: 0.5 ms
Lead Channel Setting Pacing Pulse Width: 1 ms
Lead Channel Setting Sensing Sensitivity: 4 mV
Pulse Gen Model: 3562
Pulse Gen Serial Number: 9097773

## 2024-04-12 ENCOUNTER — Ambulatory Visit: Payer: Self-pay | Admitting: Internal Medicine

## 2024-04-17 ENCOUNTER — Other Ambulatory Visit: Payer: Self-pay | Admitting: Internal Medicine

## 2024-04-17 DIAGNOSIS — I2510 Atherosclerotic heart disease of native coronary artery without angina pectoris: Secondary | ICD-10-CM

## 2024-04-18 NOTE — Progress Notes (Signed)
 Remote PPM Transmission

## 2024-04-23 ENCOUNTER — Ambulatory Visit (INDEPENDENT_AMBULATORY_CARE_PROVIDER_SITE_OTHER): Admitting: Radiology

## 2024-04-23 ENCOUNTER — Ambulatory Visit: Admitting: Radiology

## 2024-04-23 ENCOUNTER — Telehealth: Payer: Self-pay | Admitting: Radiology

## 2024-04-23 DIAGNOSIS — E538 Deficiency of other specified B group vitamins: Secondary | ICD-10-CM | POA: Diagnosis not present

## 2024-04-23 MED ORDER — CYANOCOBALAMIN 1000 MCG/ML IJ SOLN
1000.0000 ug | Freq: Once | INTRAMUSCULAR | Status: AC
  Administered 2024-04-23: 1000 ug via INTRAMUSCULAR

## 2024-04-23 NOTE — Progress Notes (Signed)
 Patient here for monthly B-12 injection patient tolerated well with no complications.  Patient Signatures are in 04/23/24 10:20 Office visit. Only one B12 was given today. For documentation its b12 has been recorded twice

## 2024-04-23 NOTE — Progress Notes (Unsigned)
Patient here for monthly B12 injection. Patient tolerated well with no complications.

## 2024-04-23 NOTE — Telephone Encounter (Signed)
 Copied from CRM [id number]. Topic: Clinical - Prescription Issue >> Apr 23, 2024 11:01 AM Franky GRADE wrote: Reason for CRM: Thy from [facility] pharmacy is calling for follow up on a request for gemfibrozil  (LOPID ) 600 MG tablet [[id number]], looks like the transmission failed on 04/20/2024.

## 2024-04-27 ENCOUNTER — Other Ambulatory Visit: Payer: Self-pay | Admitting: Internal Medicine

## 2024-04-27 DIAGNOSIS — I2510 Atherosclerotic heart disease of native coronary artery without angina pectoris: Secondary | ICD-10-CM

## 2024-04-27 MED ORDER — AMLODIPINE BESYLATE 2.5 MG PO TABS: 2.5000 mg | ORAL_TABLET | Freq: Every day | ORAL | 1 refills | Status: DC

## 2024-04-27 MED ORDER — GEMFIBROZIL 600 MG PO TABS: ORAL_TABLET | ORAL | 1 refills | Status: DC

## 2024-04-27 NOTE — Telephone Encounter (Signed)
 Unable to find pharmacy to pend     Copied from CRM [id number]. Topic: Clinical - Prescription Issue >> Apr 27, 2024  8:39 AM Wess RAMAN wrote: Reason for CRM: Patient's daughter, Clarita, stated pharmacy was unable to contact provider for refill. They would like the prescriptions mailed to [address]   Medications: gemfibrozil  (LOPID ) 600 MG tablet  amLODipine  (NORVASC ) 2.5 MG tablet   Daughter's Callback #: [phone]  Pharmacy: [facility] [address] [address] [place], [place] [address] [facility] [phone]

## 2024-04-29 ENCOUNTER — Other Ambulatory Visit: Payer: Self-pay

## 2024-04-29 DIAGNOSIS — I2510 Atherosclerotic heart disease of native coronary artery without angina pectoris: Secondary | ICD-10-CM

## 2024-04-29 MED ORDER — GEMFIBROZIL 600 MG PO TABS: ORAL_TABLET | ORAL | 1 refills | Status: DC

## 2024-04-29 NOTE — Telephone Encounter (Signed)
 I have since resent this prescription

## 2024-05-22 ENCOUNTER — Ambulatory Visit (INDEPENDENT_AMBULATORY_CARE_PROVIDER_SITE_OTHER)

## 2024-05-22 DIAGNOSIS — E538 Deficiency of other specified B group vitamins: Secondary | ICD-10-CM

## 2024-05-22 DIAGNOSIS — Z23 Encounter for immunization: Secondary | ICD-10-CM | POA: Diagnosis not present

## 2024-05-22 MED ORDER — CYANOCOBALAMIN 1000 MCG/ML IJ SOLN
1000.0000 ug | Freq: Once | INTRAMUSCULAR | Status: AC
  Administered 2024-05-22: 1000 ug via INTRAMUSCULAR

## 2024-05-22 NOTE — Progress Notes (Signed)
 After obtaining consent, and per orders of Dr. Norleen, injection of B12 and High Dose Flu given by Edsel CHRISTELLA Kerns. Patient tolerated well.

## 2024-05-29 DIAGNOSIS — H4720 Unspecified optic atrophy: Secondary | ICD-10-CM | POA: Diagnosis not present

## 2024-05-29 DIAGNOSIS — H04121 Dry eye syndrome of right lacrimal gland: Secondary | ICD-10-CM | POA: Diagnosis not present

## 2024-05-29 DIAGNOSIS — H2512 Age-related nuclear cataract, left eye: Secondary | ICD-10-CM | POA: Diagnosis not present

## 2024-05-29 DIAGNOSIS — H5202 Hypermetropia, left eye: Secondary | ICD-10-CM | POA: Diagnosis not present

## 2024-06-15 DIAGNOSIS — Z8546 Personal history of malignant neoplasm of prostate: Secondary | ICD-10-CM | POA: Diagnosis not present

## 2024-06-17 ENCOUNTER — Ambulatory Visit: Admitting: Internal Medicine

## 2024-06-17 VITALS — BP 134/82 | Temp 97.7°F | Ht 72.0 in | Wt 166.2 lb

## 2024-06-17 DIAGNOSIS — E538 Deficiency of other specified B group vitamins: Secondary | ICD-10-CM

## 2024-06-17 DIAGNOSIS — N1831 Chronic kidney disease, stage 3a: Secondary | ICD-10-CM | POA: Diagnosis not present

## 2024-06-17 DIAGNOSIS — R739 Hyperglycemia, unspecified: Secondary | ICD-10-CM | POA: Diagnosis not present

## 2024-06-17 DIAGNOSIS — I10 Essential (primary) hypertension: Secondary | ICD-10-CM | POA: Diagnosis not present

## 2024-06-17 DIAGNOSIS — C61 Malignant neoplasm of prostate: Secondary | ICD-10-CM

## 2024-06-17 DIAGNOSIS — E785 Hyperlipidemia, unspecified: Secondary | ICD-10-CM | POA: Diagnosis not present

## 2024-06-17 NOTE — Progress Notes (Signed)
 Patient ID: [name], male   DOB: 08/15/1931, 89 y.o.   MRN: [id number]        Chief Complaint: follow up HTN, HLD and hyperglycemia, ckd 3a, low b12, hx of prostate ca        HPI:  [name] is a 89 y.o. male here overall doing nicely; Pt denies chest pain, increased sob or doe, wheezing, orthopnea, PND, increased LE swelling, palpitations, dizziness or syncope.   Pt denies polydipsia, polyuria, or new focal neuro s/s.    Pt denies fever, wt loss, night sweats, loss of appetite, or other constitutional symptoms  Had renal labs drawn a few days ago, and renal f/u appt nov 14.  Cont's IM replacement for b12.         Wt Readings from Last 3 Encounters:  06/17/24 166 lb 4 oz (75.4 kg)  12/23/23 162 lb (73.5 kg)  11/10/23 160 lb (72.6 kg)   BP Readings from Last 3 Encounters:  06/17/24 134/82  12/23/23 122/70  11/10/23 (!) 142/58         Past Medical History:  Diagnosis Date   AICD (automatic cardioverter/defibrillator) present    turned it off today, put in an extra lead,  and made it just a pacemaker (08/21/2018)   Anemia, unspecified    Aortic stenosis    Mild, echo, October, 2011   Arthritis    joints; arms  (08/21/2018)   AV block, complete (HCC)    Permanent pacemaker   CAD (coronary artery disease)    DES to LAD 2005 /  DES to LAD 2008   Cardiomyopathy    Ischemic, EF 30%, 2005   Chronic bronchitis (HCC)    DJD (degenerative joint disease)    Dyslipidemia    Ejection fraction < 50%    EF 30%, echo, 2005  /  EF 30%, catheterization, 2008   GERD (gastroesophageal reflux disease)    Hyperlipidemia    Hypertension    Hypothyroidism    ICD (implantable cardiac defibrillator), single, in situ    for syncope and arrhythmia 2005 / generator change June, 2011   Iron deficiency anemia 06/17/2018   LBBB (left bundle branch block)    Mitral regurgitation    Mild, echo, October, 2011   Myocardial infarction [facility]) 1989   Presence of permanent cardiac pacemaker     Prostate cancer [facility])    Rectal fissure    Syncope    2005, ICD placed   Systolic CHF, chronic (HCC)    Vitamin B12 deficiency    Past Surgical History:  Procedure Laterality Date   BIV UPGRADE  08/21/2018   BIV UPGRADE N/A 08/21/2018   Procedure: BIV UPGRADE;  Surgeon: Waddell Danelle ORN, MD;  Location: [facility] INVASIVE [facility];  Service: Cardiovascular;  Laterality: N/A;   CARDIAC CATHETERIZATION  2008   ICD IMPLANT  2005   INSERTION PROSTATE RADIATION SEED     LAPAROSCOPIC CHOLECYSTECTOMY  04/2005   Dr. Ethyl   PROSTATE BIOPSY     RECTAL DILATATION     stretched it twice (08/21/2018)   SHOULDER OPEN ROTATOR CUFF REPAIR Right     reports that he quit smoking about 71 years ago. His smoking use included cigarettes. He started smoking about 73 years ago. He has a 2 pack-year smoking history. He has never used smokeless tobacco. He reports that he does not drink alcohol and does not use drugs. family history includes Breast cancer in his maternal aunt; Cancer (age of onset:  44) in his mother; Heart Problems in his brother and father; Heart attack in his brother; Heart attack (age of onset: 21) in his father; Lung cancer in his brother and sister; Other in his sister; Stroke in his brother and mother. Allergies  Allergen Reactions   Pantoprazole  Other (See Comments)    B12 levels dropped on medication   Zetia  [Ezetimibe ] Other (See Comments)    Arm weakness   Current Outpatient Medications on File Prior to Visit  Medication Sig Dispense Refill   acetaminophen  (TYLENOL ) 650 MG CR tablet Take 1,300 mg by mouth 2 (two) times daily.     amLODipine  (NORVASC ) 2.5 MG tablet Take 1 tablet (2.5 mg total) by mouth daily. 90 tablet 1   CALCIUM PO Take 2 tablets by mouth daily.      Cholecalciferol (VITAMIN D ) 50 MCG (2000 UT) CAPS Take 2,000 Units by mouth daily.     clopidogrel  (PLAVIX ) 75 MG tablet TAKE 1 TABLET BY MOUTH DAILY 90 tablet 3   fish oil-omega-3 fatty acids 1000 MG capsule Take 1 g  by mouth 2 (two) times daily.     gemfibrozil  (LOPID ) 600 MG tablet TAKE 1 TABLET BY MOUTH DAILY. GENERIC EQUIVALENT FOR LOPID  90 tablet 1   levothyroxine  (SYNTHROID ) 50 MCG tablet TAKE 1 TABLET BY MOUTH DAILY BEFORE BREAKFAST 90 tablet 3   metoprolol  succinate (TOPROL -XL) 50 MG 24 hr tablet TAKE 1 TABLET BY MOUTH DAILY. TAKE WITH OR IMMEDIATELY FOLLOWING A MEAL. 90 tablet 3   Multiple Vitamins-Minerals (MENS MULTIVITAMIN PLUS PO) Take 1 tablet by mouth daily.     nystatin cream (MYCOSTATIN) Apply 1 Application topically daily as needed for dry skin.     omeprazole  (PRILOSEC) 20 MG capsule Take 1 capsule (20 mg total) by mouth daily. 90 capsule 3   polyethylene glycol (MIRALAX  / GLYCOLAX ) packet Take 17 g by mouth daily as needed for mild constipation. Take 17g mixed with liquid once daily, may do a second dose as needed for constipation     simvastatin  (ZOCOR ) 40 MG tablet TAKE 1 TABLET BY MOUTH AT BEDTIME 90 tablet 3   No current facility-administered medications on file prior to visit.        ROS:  All others reviewed and negative.  Objective        PE:  BP 134/82   Temp 97.7 F (36.5 C) (Temporal)   Ht 6' (1.829 m)   Wt 166 lb 4 oz (75.4 kg)   SpO2 97%   BMI 22.55 kg/m                 Constitutional: Pt appears in NAD               HENT: Head: NCAT.                Right Ear: External ear normal.                 Left Ear: External ear normal.                Eyes: . Pupils are equal, round, and reactive to light. Conjunctivae and EOM are normal               Nose: without d/c or deformity               Neck: Neck supple. Gross normal ROM               Cardiovascular: Normal rate and regular  rhythm.                 Pulmonary/Chest: Effort normal and breath sounds without rales or wheezing.                Abd:  Soft, NT, ND, + BS, no organomegaly               Neurological: Pt is alert. At baseline orientation, motor grossly intact               Skin: Skin is warm. No rashes, no  other new lesions, LE edema - none               Psychiatric: Pt behavior is normal without agitation   Micro: none  Cardiac tracings I have personally interpreted today:  none  Pertinent Radiological findings (summarize): none   Lab Results  Component Value Date   WBC 4.1 12/23/2023   HGB 10.5 (L) 12/23/2023   HCT 30.1 (L) 12/23/2023   PLT 223.0 12/23/2023   GLUCOSE 103 (H) 12/23/2023   CHOL 149 12/23/2023   TRIG 142.0 12/23/2023   HDL 40.30 12/23/2023   LDLDIRECT 95.0 08/08/2022   LDLCALC 80 12/23/2023   ALT 10 12/23/2023   AST 25 12/23/2023   NA 139 12/23/2023   K 4.9 12/23/2023   CL 103 12/23/2023   CREATININE 1.60 (H) 12/23/2023   BUN 26 (H) 12/23/2023   CO2 29 12/23/2023   TSH 4.48 12/23/2023   PSA 0.00 (L) 01/24/2021   INR 0.95 01/06/2010   HGBA1C 5.4 12/23/2023   MICROALBUR <0.7 12/23/2023   Assessment/Plan:  ROLONDO PIERRE is a 89 y.o. White or Caucasian [1] male with  has a past medical history of AICD (automatic cardioverter/defibrillator) present, Anemia, unspecified, Aortic stenosis, Arthritis, AV block, complete (HCC), CAD (coronary artery disease), Cardiomyopathy, Chronic bronchitis (HCC), DJD (degenerative joint disease), Dyslipidemia, Ejection fraction < 50%, GERD (gastroesophageal reflux disease), Hyperlipidemia, Hypertension, Hypothyroidism, ICD (implantable cardiac defibrillator), single, in situ, Iron deficiency anemia (06/17/2018), LBBB (left bundle branch block), Mitral regurgitation, Myocardial infarction (HCC) (1989), Presence of permanent cardiac pacemaker, Prostate cancer (HCC), Rectal fissure, Syncope, Systolic CHF, chronic (HCC), and Vitamin B12 deficiency.  Malignant neoplasm of prostate (HCC) Stable per pt report, cont f/u urology as planned  Vitamin B 12 deficiency Lab Results  Component Value Date   VITAMINB12 222 12/23/2023   Low, to cont IM replacement - b12 1000 mcg IM monthly, f/u lab with next labs but declines today as just had  renal labs drawn  Hypertension BP Readings from Last 3 Encounters:  06/17/24 134/82  12/23/23 122/70  11/10/23 (!) 142/58   Stable, pt to continue medical treatment norvasc  2.5 every day, toprol  xl 50 qd   Hyperglycemia Lab Results  Component Value Date   HGBA1C 5.4 12/23/2023   Stable, pt to continue current medical treatment  - diet, wt control   Dyslipidemia Lab Results  Component Value Date   LDLCALC 80 12/23/2023   Stable, pt to continue current statin lopid  600 every day, zocor  40 qd   CKD (chronic kidney disease) stage 3, GFR 30-59 ml/min (HCC) Ckd3a Lab Results  Component Value Date   CREATININE 1.60 (H) 12/23/2023   Stable overall, cont to avoid nephrotoxins, f/u renal as planned to f/u lab drawn earlier this wk  Followup: Return in about 6 months (around 12/15/2024).  Lynwood Rush, MD 06/20/2024 2:44 PM [facility] [place] [facility]

## 2024-06-17 NOTE — Patient Instructions (Signed)
 Please continue all other medications as before, including the B12 shots  Please have the pharmacy call with any other refills you may need.  Please continue your efforts at being more active, low cholesterol diet, and weight control.  You are otherwise up to date with prevention measures today.  Please keep your appointments with your specialists as you may have planned - cardiology yearly, and urology next wk  No other changes today, and we can hold on lab testing here since you already had labs done  Please make an Appointment to return in 6 months, or sooner if needed

## 2024-06-20 ENCOUNTER — Encounter: Payer: Self-pay | Admitting: Internal Medicine

## 2024-06-20 NOTE — Assessment & Plan Note (Signed)
 Lab Results  Component Value Date   LDLCALC 80 12/23/2023   Stable, pt to continue current statin lopid  600 every day, zocor  40 qd

## 2024-06-20 NOTE — Assessment & Plan Note (Signed)
 Stable per pt report, cont f/u urology as planned

## 2024-06-20 NOTE — Assessment & Plan Note (Signed)
 Ckd3a Lab Results  Component Value Date   CREATININE 1.60 (H) 12/23/2023   Stable overall, cont to avoid nephrotoxins, f/u renal as planned to f/u lab drawn earlier this wk

## 2024-06-20 NOTE — Assessment & Plan Note (Signed)
 BP Readings from Last 3 Encounters:  06/17/24 134/82  12/23/23 122/70  11/10/23 (!) 142/58   Stable, pt to continue medical treatment norvasc  2.5 every day, toprol  xl 50 qd

## 2024-06-20 NOTE — Assessment & Plan Note (Addendum)
 Lab Results  Component Value Date   VITAMINB12 222 12/23/2023   Low, to cont IM replacement - b12 1000 mcg IM monthly, f/u lab with next labs but declines today as just had renal labs drawn

## 2024-06-20 NOTE — Assessment & Plan Note (Signed)
 Lab Results  Component Value Date   HGBA1C 5.4 12/23/2023   Stable, pt to continue current medical treatment  - diet, wt control

## 2024-06-22 ENCOUNTER — Ambulatory Visit

## 2024-06-22 DIAGNOSIS — R9721 Rising PSA following treatment for malignant neoplasm of prostate: Secondary | ICD-10-CM | POA: Diagnosis not present

## 2024-06-22 DIAGNOSIS — E538 Deficiency of other specified B group vitamins: Secondary | ICD-10-CM | POA: Diagnosis not present

## 2024-06-22 MED ORDER — CYANOCOBALAMIN 1000 MCG/ML IJ SOLN
1000.0000 ug | Freq: Once | INTRAMUSCULAR | Status: AC
  Administered 2024-06-22: 1000 ug via INTRAMUSCULAR

## 2024-06-22 NOTE — Progress Notes (Signed)
 Pt was given B12 injection w/o any complications at this time.

## 2024-07-09 ENCOUNTER — Ambulatory Visit

## 2024-07-09 DIAGNOSIS — I255 Ischemic cardiomyopathy: Secondary | ICD-10-CM | POA: Diagnosis not present

## 2024-07-11 LAB — CUP PACEART REMOTE DEVICE CHECK
Battery Remaining Longevity: 14 mo
Battery Remaining Percentage: 18 %
Battery Voltage: 2.89 V
Brady Statistic AP VP Percent: 88 %
Brady Statistic AP VS Percent: 1 %
Brady Statistic AS VP Percent: 12 %
Brady Statistic AS VS Percent: 1 %
Brady Statistic RA Percent Paced: 87 %
Date Time Interrogation Session: 20251204020009
Implantable Lead Connection Status: 753985
Implantable Lead Connection Status: 753985
Implantable Lead Connection Status: 753985
Implantable Lead Implant Date: 20050509
Implantable Lead Implant Date: 20050509
Implantable Lead Implant Date: 20200116
Implantable Lead Location: 753858
Implantable Lead Location: 753859
Implantable Lead Location: 753860
Implantable Lead Model: 1580
Implantable Pulse Generator Implant Date: 20200116
Lead Channel Impedance Value: 280 Ohm
Lead Channel Impedance Value: 380 Ohm
Lead Channel Impedance Value: 780 Ohm
Lead Channel Pacing Threshold Amplitude: 0.75 V
Lead Channel Pacing Threshold Amplitude: 1 V
Lead Channel Pacing Threshold Amplitude: 1 V
Lead Channel Pacing Threshold Pulse Width: 0.5 ms
Lead Channel Pacing Threshold Pulse Width: 0.5 ms
Lead Channel Pacing Threshold Pulse Width: 1 ms
Lead Channel Sensing Intrinsic Amplitude: 1.8 mV
Lead Channel Sensing Intrinsic Amplitude: 5.1 mV
Lead Channel Setting Pacing Amplitude: 1.75 V
Lead Channel Setting Pacing Amplitude: 2 V
Lead Channel Setting Pacing Amplitude: 2.5 V
Lead Channel Setting Pacing Pulse Width: 0.5 ms
Lead Channel Setting Pacing Pulse Width: 1 ms
Lead Channel Setting Sensing Sensitivity: 4 mV
Pulse Gen Model: 3562
Pulse Gen Serial Number: 9097773

## 2024-07-12 ENCOUNTER — Ambulatory Visit: Payer: Self-pay | Admitting: Internal Medicine

## 2024-07-14 NOTE — Progress Notes (Signed)
 Remote PPM Transmission

## 2024-07-20 ENCOUNTER — Other Ambulatory Visit: Payer: Self-pay

## 2024-07-20 ENCOUNTER — Other Ambulatory Visit: Payer: Self-pay | Admitting: Internal Medicine

## 2024-07-21 ENCOUNTER — Telehealth: Payer: Self-pay

## 2024-07-21 DIAGNOSIS — E538 Deficiency of other specified B group vitamins: Secondary | ICD-10-CM

## 2024-07-21 NOTE — Telephone Encounter (Signed)
 Patient has been getting B12 injections monthly through 2025. Last labs 12/2023 were normal. Patient scheduled for tomorrow, 12/17. Does the patient need to continue this injection, or should we just recheck the lab tomorrow?

## 2024-07-21 NOTE — Telephone Encounter (Signed)
 Ok to just check the lab - I will order, thanks

## 2024-07-22 ENCOUNTER — Ambulatory Visit

## 2024-07-22 DIAGNOSIS — E538 Deficiency of other specified B group vitamins: Secondary | ICD-10-CM

## 2024-07-22 MED ORDER — CYANOCOBALAMIN 1000 MCG/ML IJ SOLN
1000.0000 ug | Freq: Once | INTRAMUSCULAR | Status: AC
  Administered 2024-07-22: 09:00:00 1000 ug via INTRAMUSCULAR

## 2024-07-22 NOTE — Progress Notes (Signed)
 Patient received B12 injection, tolerated well.

## 2024-08-17 NOTE — Telephone Encounter (Signed)
 Called and let Pt know , Pt has refused to come in to get his lab checked.

## 2024-08-24 ENCOUNTER — Ambulatory Visit (INDEPENDENT_AMBULATORY_CARE_PROVIDER_SITE_OTHER)

## 2024-08-24 DIAGNOSIS — E538 Deficiency of other specified B group vitamins: Secondary | ICD-10-CM

## 2024-08-24 MED ORDER — CYANOCOBALAMIN 1000 MCG/ML IJ SOLN
1000.0000 ug | Freq: Once | INTRAMUSCULAR | Status: AC
  Administered 2024-08-24: 1000 ug via INTRAMUSCULAR

## 2024-08-24 NOTE — Progress Notes (Signed)
 After obtaining consent, and per orders of Dr. Jonny Ruiz, injection of B12 given by Ferdie Ping. Patient instructed to report any adverse reaction to me immediately.

## 2024-08-25 ENCOUNTER — Ambulatory Visit: Admitting: Internal Medicine

## 2024-08-25 ENCOUNTER — Other Ambulatory Visit: Payer: Self-pay | Admitting: Internal Medicine

## 2024-08-25 ENCOUNTER — Encounter: Payer: Self-pay | Admitting: Internal Medicine

## 2024-08-25 VITALS — BP 132/80 | HR 75 | Temp 98.0°F | Ht 72.0 in | Wt 167.0 lb

## 2024-08-25 DIAGNOSIS — E538 Deficiency of other specified B group vitamins: Secondary | ICD-10-CM | POA: Diagnosis not present

## 2024-08-25 DIAGNOSIS — R739 Hyperglycemia, unspecified: Secondary | ICD-10-CM | POA: Diagnosis not present

## 2024-08-25 DIAGNOSIS — I2510 Atherosclerotic heart disease of native coronary artery without angina pectoris: Secondary | ICD-10-CM | POA: Diagnosis not present

## 2024-08-25 DIAGNOSIS — I10 Essential (primary) hypertension: Secondary | ICD-10-CM

## 2024-08-25 DIAGNOSIS — E039 Hypothyroidism, unspecified: Secondary | ICD-10-CM

## 2024-08-25 MED ORDER — LEVOTHYROXINE SODIUM 50 MCG PO TABS: 50.0000 ug | ORAL_TABLET | Freq: Every day | ORAL | 3 refills | Status: AC

## 2024-08-25 MED ORDER — SIMVASTATIN 40 MG PO TABS: 40.0000 mg | ORAL_TABLET | Freq: Every day | ORAL | 3 refills | Status: DC

## 2024-08-25 MED ORDER — CLOPIDOGREL BISULFATE 75 MG PO TABS: 75.0000 mg | ORAL_TABLET | Freq: Every day | ORAL | 3 refills | Status: AC

## 2024-08-25 MED ORDER — METOPROLOL SUCCINATE ER 50 MG PO TB24: 50.0000 mg | ORAL_TABLET | Freq: Every day | ORAL | 3 refills | Status: AC

## 2024-08-25 MED ORDER — OMEPRAZOLE 20 MG PO CPDR: 20.0000 mg | DELAYED_RELEASE_CAPSULE | Freq: Every day | ORAL | 3 refills | Status: DC

## 2024-08-25 MED ORDER — AMLODIPINE BESYLATE 2.5 MG PO TABS: 2.5000 mg | ORAL_TABLET | Freq: Every day | ORAL | 1 refills | Status: AC

## 2024-08-25 MED ORDER — GEMFIBROZIL 600 MG PO TABS: ORAL_TABLET | ORAL | 1 refills | Status: DC

## 2024-08-25 NOTE — Assessment & Plan Note (Signed)
 Lab Results  Component Value Date   TSH 4.48 12/23/2023   Stable, pt to continue levothyroxine  50 mcg qd

## 2024-08-25 NOTE — Patient Instructions (Signed)
 Please be sure to have B12 in your multivitamin daily  Please continue all other medications as before, and refills have been done if requested.  Please have the pharmacy call with any other refills you may need.  Please continue your efforts at being more active, low cholesterol diet, and weight control.  You are otherwise up to date with prevention measures today.  Please keep your appointments with your specialists as you may have planned  We can hold on further lab testing  Please make an Appointment to return in 6 months, or sooner if needed

## 2024-08-25 NOTE — Assessment & Plan Note (Signed)
 BP Readings from Last 3 Encounters:  08/25/24 132/80  06/17/24 134/82  12/23/23 122/70   Stable, pt to continue medical treatment norvasc  2.5 mg every day, toprol  xl 50 mg qd

## 2024-08-25 NOTE — Progress Notes (Signed)
 Patient ID: [name], male   DOB: 07-18-1932, 89 y.o.   MRN: [id number]        Chief Complaint: follow up low b12, low thyroid , htn, hyperglycemia       HPI:  [name] is a 89 y.o. male here with daughter;  overall doing ok,  Pt denies chest pain, increased sob or doe, wheezing, orthopnea, PND, increased LE swelling, palpitations, dizziness or syncope.   Pt denies polydipsia, polyuria, or new focal neuro s/s.    Pt denies fever, wt loss, night sweats, loss of appetite, or other constitutional symptoms    Had recent labs with renal jan 14 with stable function.  S/p iron infusion Oct 28 2023 per renal last year, to f/u at one year with renal 2026.  Declines f/u lab today   Wt Readings from Last 3 Encounters:  08/25/24 167 lb (75.8 kg)  06/17/24 166 lb 4 oz (75.4 kg)  12/23/23 162 lb (73.5 kg)   BP Readings from Last 3 Encounters:  08/25/24 132/80  06/17/24 134/82  12/23/23 122/70         Past Medical History:  Diagnosis Date   AICD (automatic cardioverter/defibrillator) present    turned it off today, put in an extra lead,  and made it just a pacemaker (08/21/2018)   Anemia, unspecified    Aortic stenosis    Mild, echo, October, 2011   Arthritis    joints; arms  (08/21/2018)   AV block, complete (HCC)    Permanent pacemaker   CAD (coronary artery disease)    DES to LAD 2005 /  DES to LAD 2008   Cardiomyopathy    Ischemic, EF 30%, 2005   Chronic bronchitis (HCC)    DJD (degenerative joint disease)    Dyslipidemia    Ejection fraction < 50%    EF 30%, echo, 2005  /  EF 30%, catheterization, 2008   GERD (gastroesophageal reflux disease)    Hyperlipidemia    Hypertension    Hypothyroidism    ICD (implantable cardiac defibrillator), single, in situ    for syncope and arrhythmia 2005 / generator change June, 2011   Iron deficiency anemia 06/17/2018   LBBB (left bundle branch block)    Mitral regurgitation    Mild, echo, October, 2011   Myocardial infarction  [facility]) 1989   Presence of permanent cardiac pacemaker    Prostate cancer [facility])    Rectal fissure    Syncope    2005, ICD placed   Systolic CHF, chronic (HCC)    Vitamin B12 deficiency    Past Surgical History:  Procedure Laterality Date   BIV UPGRADE  08/21/2018   BIV UPGRADE N/A 08/21/2018   Procedure: BIV UPGRADE;  Surgeon: Waddell Danelle ORN, MD;  Location: [facility] INVASIVE CV LAB;  Service: Cardiovascular;  Laterality: N/A;   CARDIAC CATHETERIZATION  2008   ICD IMPLANT  2005   INSERTION PROSTATE RADIATION SEED     LAPAROSCOPIC CHOLECYSTECTOMY  04/2005   Dr. Ethyl   PROSTATE BIOPSY     RECTAL DILATATION     stretched it twice (08/21/2018)   SHOULDER OPEN ROTATOR CUFF REPAIR Right     reports that he quit smoking about 72 years ago. His smoking use included cigarettes. He started smoking about 74 years ago. He has a 2 pack-year smoking history. He has never used smokeless tobacco. He reports that he does not drink alcohol and does not use drugs. family history includes Breast cancer in his  maternal aunt; Cancer (age of onset: 81) in his mother; Heart Problems in his brother and father; Heart attack in his brother; Heart attack (age of onset: 56) in his father; Lung cancer in his brother and sister; Other in his sister; Stroke in his brother and mother. Allergies[1] Medications Ordered Prior to Encounter[2]      ROS:  All others reviewed and negative.  Objective        PE:  BP 132/80 (BP Location: Left Arm, Patient Position: Sitting, Cuff Size: Normal)   Pulse 75   Temp 98 F (36.7 C) (Oral)   Ht 6' (1.829 m)   Wt 167 lb (75.8 kg)   SpO2 98%   BMI 22.65 kg/m                 Constitutional: Pt appears in NAD               HENT: Head: NCAT.                Right Ear: External ear normal.                 Left Ear: External ear normal.                Eyes: . Pupils are equal, round, and reactive to light. Conjunctivae and EOM are normal               Nose: without d/c or deformity                Neck: Neck supple. Gross normal ROM               Cardiovascular: Normal rate and regular rhythm.                 Pulmonary/Chest: Effort normal and breath sounds without rales or wheezing.                Abd:  Soft, NT, ND, + BS, no organomegaly               Neurological: Pt is alert. At baseline orientation, motor grossly intact               Skin: Skin is warm. No rashes, no other new lesions, LE edema - none               Psychiatric: Pt behavior is normal without agitation   Micro: none  Cardiac tracings I have personally interpreted today:  none  Pertinent Radiological findings (summarize): none   Lab Results  Component Value Date   WBC 4.1 12/23/2023   HGB 10.5 (L) 12/23/2023   HCT 30.1 (L) 12/23/2023   PLT 223.0 12/23/2023   GLUCOSE 103 (H) 12/23/2023   CHOL 149 12/23/2023   TRIG 142.0 12/23/2023   HDL 40.30 12/23/2023   LDLDIRECT 95.0 08/08/2022   LDLCALC 80 12/23/2023   ALT 10 12/23/2023   AST 25 12/23/2023   NA 139 12/23/2023   K 4.9 12/23/2023   CL 103 12/23/2023   CREATININE 1.60 (H) 12/23/2023   BUN 26 (H) 12/23/2023   CO2 29 12/23/2023   TSH 4.48 12/23/2023   PSA 0.00 (L) 01/24/2021   INR 0.95 01/06/2010   HGBA1C 5.4 12/23/2023   MICROALBUR <0.7 12/23/2023   Assessment/Plan:  [name] is a 89 y.o. White or Caucasian [1] male with  has a past medical history of AICD (automatic cardioverter/defibrillator) present, Anemia, unspecified, Aortic stenosis, Arthritis, AV block, complete (  HCC), CAD (coronary artery disease), Cardiomyopathy, Chronic bronchitis (HCC), DJD (degenerative joint disease), Dyslipidemia, Ejection fraction < 50%, GERD (gastroesophageal reflux disease), Hyperlipidemia, Hypertension, Hypothyroidism, ICD (implantable cardiac defibrillator), single, in situ, Iron deficiency anemia (06/17/2018), LBBB (left bundle branch block), Mitral regurgitation, Myocardial infarction (HCC) (1989), Presence of permanent cardiac pacemaker,  Prostate cancer (HCC), Rectal fissure, Syncope, Systolic CHF, chronic (HCC), and Vitamin B12 deficiency.  Vitamin B 12 deficiency Lab Results  Component Value Date   VITAMINB12 222 12/23/2023   Low, to start oral replacement - b12 1000 mcg qd   Hypothyroidism Lab Results  Component Value Date   TSH 4.48 12/23/2023   Stable, pt to continue levothyroxine  50 mcg qd   Hypertension BP Readings from Last 3 Encounters:  08/25/24 132/80  06/17/24 134/82  12/23/23 122/70   Stable, pt to continue medical treatment norvasc  2.5 mg every day, toprol  xl 50 mg qd    Hyperglycemia Lab Results  Component Value Date   HGBA1C 5.4 12/23/2023   Stable, pt to continue current medical treatment  - diet, wt control  Followup: Return in about 6 months (around 02/22/2025).  Lynwood Rush, MD 08/25/2024 12:59 PM [facility]     [1]  Allergies Allergen Reactions   Pantoprazole  Other (See Comments)    B12 levels dropped on medication   Zetia  [Ezetimibe ] Other (See Comments)    Arm weakness  [2]  Current Outpatient Medications on File Prior to Visit  Medication Sig Dispense Refill   acetaminophen  (TYLENOL ) 650 MG CR tablet Take 1,300 mg by mouth 2 (two) times daily.     CALCIUM PO Take 2 tablets by mouth daily.      Cholecalciferol (VITAMIN D ) 50 MCG (2000 UT) CAPS Take 2,000 Units by mouth daily.     fish oil-omega-3 fatty acids 1000 MG capsule Take 1 g by mouth 2 (two) times daily.     Multiple Vitamins-Minerals (MENS MULTIVITAMIN PLUS PO) Take 1 tablet by mouth daily.     nystatin cream (MYCOSTATIN) Apply 1 Application topically daily as needed for dry skin.     polyethylene glycol (MIRALAX  / GLYCOLAX ) packet Take 17 g by mouth daily as needed for mild constipation. Take 17g mixed with liquid once daily, may do a second dose as needed for constipation     No current facility-administered medications on file prior to  visit.

## 2024-08-25 NOTE — Assessment & Plan Note (Signed)
 Lab Results  Component Value Date   VITAMINB12 222 12/23/2023   Low, to start oral replacement - b12 1000 mcg qd

## 2024-08-25 NOTE — Assessment & Plan Note (Signed)
 Lab Results  Component Value Date   HGBA1C 5.4 12/23/2023   Stable, pt to continue current medical treatment  - diet, wt control

## 2024-08-26 NOTE — Telephone Encounter (Signed)
 Ok to contact pt - simvastatin  no longer is considered safe to take with gemfibrozil  as well  Ok to STOP the gemfibrozil , and continue the simvastatin  for now.   Thanks

## 2024-09-07 ENCOUNTER — Other Ambulatory Visit: Payer: Self-pay | Admitting: Internal Medicine

## 2024-09-07 DIAGNOSIS — I2510 Atherosclerotic heart disease of native coronary artery without angina pectoris: Secondary | ICD-10-CM

## 2024-09-08 ENCOUNTER — Telehealth: Payer: Self-pay

## 2024-09-08 ENCOUNTER — Other Ambulatory Visit: Payer: Self-pay

## 2024-09-08 DIAGNOSIS — I2510 Atherosclerotic heart disease of native coronary artery without angina pectoris: Secondary | ICD-10-CM

## 2024-09-08 MED ORDER — GEMFIBROZIL 600 MG PO TABS: ORAL_TABLET | ORAL | 1 refills | Status: AC

## 2024-09-08 NOTE — Telephone Encounter (Signed)
 Tried calling Pt and phone disconnected per Patient chart the medication was sent to [facility] pharmacy on 08/25/24.

## 2024-09-08 NOTE — Telephone Encounter (Unsigned)
 Copied from CRM [id number]. Topic: Clinical - Prescription Issue >> Sep 07, 2024  3:56 PM Alexandria E wrote: Reason for CRM: Patient's daughter, Clarita, called in stating that they recently switched patient's medications over to the [facility]. However, the pharmacy relayed that the provider discontinued gemfibrozil  (LOPID ) 600 MG tablet and unsure as to why, as this was not discussed at patient's last visit. Patient has a couple weeks left of medication, but daughter would like to clarify if a refill is necessary or if PCP would like him off of this medication.

## 2024-09-24 ENCOUNTER — Ambulatory Visit

## 2024-10-08 ENCOUNTER — Ambulatory Visit

## 2025-01-04 ENCOUNTER — Ambulatory Visit: Admitting: Physician Assistant

## 2025-01-07 ENCOUNTER — Ambulatory Visit

## 2025-02-04 ENCOUNTER — Ambulatory Visit: Admitting: Nurse Practitioner

## 2025-04-08 ENCOUNTER — Ambulatory Visit

## 2025-07-08 ENCOUNTER — Ambulatory Visit
